# Patient Record
Sex: Female | Born: 1945
Health system: Southern US, Community
[De-identification: ages and names within clinical notes are randomized; demographics above are authoritative.]

## PROBLEM LIST (undated history)

## (undated) DIAGNOSIS — Z9889 Other specified postprocedural states: Secondary | ICD-10-CM

## (undated) DIAGNOSIS — E079 Disorder of thyroid, unspecified: Secondary | ICD-10-CM

## (undated) DIAGNOSIS — E785 Hyperlipidemia, unspecified: Secondary | ICD-10-CM

## (undated) DIAGNOSIS — R040 Epistaxis: Secondary | ICD-10-CM

## (undated) DIAGNOSIS — I4891 Unspecified atrial fibrillation: Secondary | ICD-10-CM

## (undated) DIAGNOSIS — D649 Anemia, unspecified: Secondary | ICD-10-CM

## (undated) DIAGNOSIS — I10 Essential (primary) hypertension: Secondary | ICD-10-CM

## (undated) DIAGNOSIS — N186 End stage renal disease: Secondary | ICD-10-CM

## (undated) DIAGNOSIS — M1990 Unspecified osteoarthritis, unspecified site: Secondary | ICD-10-CM

## (undated) DIAGNOSIS — Z973 Presence of spectacles and contact lenses: Secondary | ICD-10-CM

## (undated) DIAGNOSIS — Z8709 Personal history of other diseases of the respiratory system: Secondary | ICD-10-CM

## (undated) DIAGNOSIS — M12811 Other specific arthropathies, not elsewhere classified, right shoulder: Secondary | ICD-10-CM

## (undated) DIAGNOSIS — R112 Nausea with vomiting, unspecified: Secondary | ICD-10-CM

## (undated) DIAGNOSIS — M75101 Unspecified rotator cuff tear or rupture of right shoulder, not specified as traumatic: Secondary | ICD-10-CM

## (undated) DIAGNOSIS — E669 Obesity, unspecified: Secondary | ICD-10-CM

## (undated) HISTORY — PX: CHOLECYSTECTOMY: SHX55

## (undated) HISTORY — PX: APPENDECTOMY: SHX54

## (undated) HISTORY — PX: ABDOMINAL HYSTERECTOMY: SHX81

## (undated) HISTORY — PX: TONSILLECTOMY: SUR1361

## (undated) HISTORY — PX: COLONOSCOPY: SHX174

## (undated) HISTORY — PX: KIDNEY TRANSPLANT: SHX239

---

## 2000-03-29 ENCOUNTER — Ambulatory Visit (HOSPITAL_COMMUNITY): Admission: RE | Admit: 2000-03-29 | Discharge: 2000-03-29 | Payer: Self-pay | Admitting: Cardiology

## 2000-03-29 ENCOUNTER — Encounter: Payer: Self-pay | Admitting: Cardiology

## 2000-03-30 ENCOUNTER — Encounter: Payer: Self-pay | Admitting: Cardiology

## 2000-04-01 ENCOUNTER — Ambulatory Visit (HOSPITAL_COMMUNITY): Admission: RE | Admit: 2000-04-01 | Discharge: 2000-04-01 | Payer: Self-pay | Admitting: Cardiology

## 2002-02-09 ENCOUNTER — Other Ambulatory Visit: Admission: RE | Admit: 2002-02-09 | Discharge: 2002-02-09 | Payer: Self-pay | Admitting: Internal Medicine

## 2002-03-01 ENCOUNTER — Ambulatory Visit (HOSPITAL_COMMUNITY): Admission: RE | Admit: 2002-03-01 | Discharge: 2002-03-01 | Payer: Self-pay | Admitting: Internal Medicine

## 2002-03-01 ENCOUNTER — Encounter: Payer: Self-pay | Admitting: Internal Medicine

## 2002-04-12 ENCOUNTER — Encounter: Admission: RE | Admit: 2002-04-12 | Discharge: 2002-07-11 | Payer: Self-pay | Admitting: Internal Medicine

## 2002-07-20 ENCOUNTER — Encounter: Admission: RE | Admit: 2002-07-20 | Discharge: 2002-10-18 | Payer: Self-pay | Admitting: Internal Medicine

## 2002-11-08 ENCOUNTER — Encounter: Admission: RE | Admit: 2002-11-08 | Discharge: 2003-02-06 | Payer: Self-pay | Admitting: Internal Medicine

## 2003-02-27 ENCOUNTER — Encounter: Admission: RE | Admit: 2003-02-27 | Discharge: 2003-05-28 | Payer: Self-pay | Admitting: Internal Medicine

## 2003-04-30 ENCOUNTER — Encounter: Payer: Self-pay | Admitting: Internal Medicine

## 2003-04-30 ENCOUNTER — Ambulatory Visit (HOSPITAL_COMMUNITY): Admission: RE | Admit: 2003-04-30 | Discharge: 2003-04-30 | Payer: Self-pay | Admitting: Internal Medicine

## 2005-01-21 ENCOUNTER — Encounter: Admission: RE | Admit: 2005-01-21 | Discharge: 2005-01-21 | Payer: Self-pay | Admitting: Cardiology

## 2005-10-03 ENCOUNTER — Emergency Department (HOSPITAL_COMMUNITY): Admission: EM | Admit: 2005-10-03 | Discharge: 2005-10-03 | Payer: Self-pay | Admitting: Family Medicine

## 2006-04-23 ENCOUNTER — Ambulatory Visit (HOSPITAL_COMMUNITY): Admission: RE | Admit: 2006-04-23 | Discharge: 2006-04-23 | Payer: Self-pay | Admitting: Internal Medicine

## 2006-05-06 ENCOUNTER — Encounter: Admission: RE | Admit: 2006-05-06 | Discharge: 2006-05-06 | Payer: Self-pay | Admitting: Internal Medicine

## 2006-08-12 ENCOUNTER — Encounter: Admission: RE | Admit: 2006-08-12 | Discharge: 2006-08-12 | Payer: Self-pay | Admitting: Internal Medicine

## 2006-09-02 ENCOUNTER — Encounter: Admission: RE | Admit: 2006-09-02 | Discharge: 2006-09-02 | Payer: Self-pay | Admitting: Nephrology

## 2006-09-10 ENCOUNTER — Ambulatory Visit (HOSPITAL_COMMUNITY): Admission: RE | Admit: 2006-09-10 | Discharge: 2006-09-10 | Payer: Self-pay | Admitting: Nephrology

## 2006-09-13 ENCOUNTER — Ambulatory Visit (HOSPITAL_COMMUNITY): Admission: RE | Admit: 2006-09-13 | Discharge: 2006-09-13 | Payer: Self-pay | Admitting: Vascular Surgery

## 2006-10-20 ENCOUNTER — Ambulatory Visit (HOSPITAL_COMMUNITY): Admission: RE | Admit: 2006-10-20 | Discharge: 2006-10-20 | Payer: Self-pay | Admitting: Vascular Surgery

## 2006-12-08 ENCOUNTER — Ambulatory Visit: Payer: Self-pay | Admitting: Vascular Surgery

## 2006-12-08 ENCOUNTER — Ambulatory Visit (HOSPITAL_COMMUNITY): Admission: RE | Admit: 2006-12-08 | Discharge: 2006-12-08 | Payer: Self-pay | Admitting: Vascular Surgery

## 2006-12-17 ENCOUNTER — Ambulatory Visit (HOSPITAL_COMMUNITY): Admission: RE | Admit: 2006-12-17 | Discharge: 2006-12-17 | Payer: Self-pay | Admitting: Vascular Surgery

## 2007-01-19 ENCOUNTER — Ambulatory Visit (HOSPITAL_COMMUNITY): Admission: RE | Admit: 2007-01-19 | Discharge: 2007-01-19 | Payer: Self-pay | Admitting: Vascular Surgery

## 2007-01-26 ENCOUNTER — Ambulatory Visit: Payer: Self-pay | Admitting: Vascular Surgery

## 2007-01-26 ENCOUNTER — Ambulatory Visit (HOSPITAL_COMMUNITY): Admission: RE | Admit: 2007-01-26 | Discharge: 2007-01-26 | Payer: Self-pay | Admitting: Vascular Surgery

## 2007-01-29 ENCOUNTER — Ambulatory Visit (HOSPITAL_COMMUNITY): Admission: RE | Admit: 2007-01-29 | Discharge: 2007-01-29 | Payer: Self-pay | Admitting: Vascular Surgery

## 2007-02-06 ENCOUNTER — Emergency Department (HOSPITAL_COMMUNITY): Admission: EM | Admit: 2007-02-06 | Discharge: 2007-02-06 | Payer: Self-pay | Admitting: Emergency Medicine

## 2007-02-15 ENCOUNTER — Ambulatory Visit: Payer: Self-pay | Admitting: Vascular Surgery

## 2007-02-23 ENCOUNTER — Ambulatory Visit (HOSPITAL_COMMUNITY): Admission: RE | Admit: 2007-02-23 | Discharge: 2007-02-23 | Payer: Self-pay | Admitting: Vascular Surgery

## 2007-03-02 ENCOUNTER — Ambulatory Visit (HOSPITAL_COMMUNITY): Admission: RE | Admit: 2007-03-02 | Discharge: 2007-03-02 | Payer: Self-pay | Admitting: Vascular Surgery

## 2007-03-02 ENCOUNTER — Ambulatory Visit: Payer: Self-pay | Admitting: Vascular Surgery

## 2007-03-09 ENCOUNTER — Ambulatory Visit: Payer: Self-pay | Admitting: Vascular Surgery

## 2007-06-01 ENCOUNTER — Ambulatory Visit: Payer: Self-pay | Admitting: Vascular Surgery

## 2007-06-01 ENCOUNTER — Ambulatory Visit (HOSPITAL_COMMUNITY): Admission: RE | Admit: 2007-06-01 | Discharge: 2007-06-01 | Payer: Self-pay | Admitting: Vascular Surgery

## 2007-06-18 ENCOUNTER — Ambulatory Visit (HOSPITAL_COMMUNITY): Admission: RE | Admit: 2007-06-18 | Discharge: 2007-06-18 | Payer: Self-pay | Admitting: Nephrology

## 2007-06-20 ENCOUNTER — Ambulatory Visit (HOSPITAL_COMMUNITY): Admission: RE | Admit: 2007-06-20 | Discharge: 2007-06-20 | Payer: Self-pay | Admitting: Vascular Surgery

## 2007-07-09 ENCOUNTER — Ambulatory Visit (HOSPITAL_COMMUNITY): Admission: RE | Admit: 2007-07-09 | Discharge: 2007-07-09 | Payer: Self-pay | Admitting: Vascular Surgery

## 2007-07-09 ENCOUNTER — Ambulatory Visit: Payer: Self-pay | Admitting: Vascular Surgery

## 2007-07-26 ENCOUNTER — Ambulatory Visit (HOSPITAL_COMMUNITY): Admission: RE | Admit: 2007-07-26 | Discharge: 2007-07-26 | Payer: Self-pay | Admitting: Surgery

## 2007-08-01 ENCOUNTER — Ambulatory Visit: Payer: Self-pay | Admitting: Surgery

## 2007-08-19 ENCOUNTER — Ambulatory Visit: Payer: Self-pay | Admitting: Surgery

## 2007-08-19 ENCOUNTER — Ambulatory Visit (HOSPITAL_COMMUNITY): Admission: RE | Admit: 2007-08-19 | Discharge: 2007-08-19 | Payer: Self-pay | Admitting: Surgery

## 2007-09-06 ENCOUNTER — Ambulatory Visit: Payer: Self-pay | Admitting: Vascular Surgery

## 2007-10-07 ENCOUNTER — Ambulatory Visit (HOSPITAL_COMMUNITY): Admission: RE | Admit: 2007-10-07 | Discharge: 2007-10-07 | Payer: Self-pay | Admitting: Vascular Surgery

## 2007-10-12 ENCOUNTER — Ambulatory Visit: Payer: Self-pay | Admitting: Vascular Surgery

## 2007-10-12 ENCOUNTER — Ambulatory Visit (HOSPITAL_COMMUNITY): Admission: RE | Admit: 2007-10-12 | Discharge: 2007-10-12 | Payer: Self-pay | Admitting: Vascular Surgery

## 2007-10-22 ENCOUNTER — Ambulatory Visit (HOSPITAL_COMMUNITY): Admission: EM | Admit: 2007-10-22 | Discharge: 2007-10-22 | Payer: Self-pay | Admitting: Vascular Surgery

## 2007-10-26 ENCOUNTER — Ambulatory Visit (HOSPITAL_COMMUNITY): Admission: RE | Admit: 2007-10-26 | Discharge: 2007-10-26 | Payer: Self-pay | Admitting: Vascular Surgery

## 2007-11-03 ENCOUNTER — Ambulatory Visit (HOSPITAL_COMMUNITY): Admission: RE | Admit: 2007-11-03 | Discharge: 2007-11-03 | Payer: Self-pay | Admitting: Surgery

## 2007-11-18 ENCOUNTER — Ambulatory Visit (HOSPITAL_COMMUNITY): Admission: RE | Admit: 2007-11-18 | Discharge: 2007-11-18 | Payer: Self-pay | Admitting: Internal Medicine

## 2007-11-28 ENCOUNTER — Ambulatory Visit: Payer: Self-pay | Admitting: Surgery

## 2007-12-09 ENCOUNTER — Ambulatory Visit: Payer: Self-pay | Admitting: Surgery

## 2007-12-09 ENCOUNTER — Ambulatory Visit (HOSPITAL_COMMUNITY): Admission: RE | Admit: 2007-12-09 | Discharge: 2007-12-09 | Payer: Self-pay | Admitting: Surgery

## 2008-01-30 ENCOUNTER — Ambulatory Visit (HOSPITAL_COMMUNITY): Admission: RE | Admit: 2008-01-30 | Discharge: 2008-01-30 | Payer: Self-pay | Admitting: Surgery

## 2008-02-03 ENCOUNTER — Ambulatory Visit: Payer: Self-pay | Admitting: *Deleted

## 2008-02-03 ENCOUNTER — Ambulatory Visit (HOSPITAL_COMMUNITY): Admission: RE | Admit: 2008-02-03 | Discharge: 2008-02-03 | Payer: Self-pay | Admitting: Nephrology

## 2008-02-10 ENCOUNTER — Ambulatory Visit (HOSPITAL_COMMUNITY): Admission: RE | Admit: 2008-02-10 | Discharge: 2008-02-10 | Payer: Self-pay | Admitting: Surgery

## 2009-03-29 ENCOUNTER — Ambulatory Visit: Payer: Self-pay | Admitting: Surgery

## 2009-03-29 ENCOUNTER — Ambulatory Visit (HOSPITAL_COMMUNITY): Admission: RE | Admit: 2009-03-29 | Discharge: 2009-03-29 | Payer: Self-pay | Admitting: Surgery

## 2009-04-30 ENCOUNTER — Encounter: Admission: RE | Admit: 2009-04-30 | Discharge: 2009-04-30 | Payer: Self-pay | Admitting: Internal Medicine

## 2009-09-20 ENCOUNTER — Ambulatory Visit (HOSPITAL_COMMUNITY): Admission: RE | Admit: 2009-09-20 | Discharge: 2009-09-20 | Payer: Self-pay | Admitting: Nephrology

## 2010-05-02 ENCOUNTER — Encounter (INDEPENDENT_AMBULATORY_CARE_PROVIDER_SITE_OTHER): Payer: Self-pay | Admitting: Emergency Medicine

## 2010-05-02 ENCOUNTER — Emergency Department (HOSPITAL_COMMUNITY): Admission: EM | Admit: 2010-05-02 | Discharge: 2010-05-02 | Payer: Self-pay | Admitting: Emergency Medicine

## 2010-05-02 ENCOUNTER — Ambulatory Visit: Payer: Self-pay | Admitting: Vascular Surgery

## 2010-05-16 ENCOUNTER — Inpatient Hospital Stay (HOSPITAL_COMMUNITY): Admission: EM | Admit: 2010-05-16 | Discharge: 2010-05-20 | Payer: Self-pay | Admitting: Emergency Medicine

## 2010-05-16 ENCOUNTER — Encounter (INDEPENDENT_AMBULATORY_CARE_PROVIDER_SITE_OTHER): Payer: Self-pay | Admitting: Emergency Medicine

## 2010-05-17 ENCOUNTER — Encounter (INDEPENDENT_AMBULATORY_CARE_PROVIDER_SITE_OTHER): Payer: Self-pay | Admitting: Internal Medicine

## 2010-05-17 ENCOUNTER — Encounter (INDEPENDENT_AMBULATORY_CARE_PROVIDER_SITE_OTHER): Payer: Self-pay | Admitting: Nephrology

## 2010-06-16 ENCOUNTER — Encounter (HOSPITAL_COMMUNITY)
Admission: RE | Admit: 2010-06-16 | Discharge: 2010-08-16 | Payer: Self-pay | Source: Home / Self Care | Attending: Cardiology | Admitting: Cardiology

## 2010-09-07 ENCOUNTER — Encounter: Payer: Self-pay | Admitting: Nephrology

## 2010-09-08 ENCOUNTER — Encounter: Payer: Self-pay | Admitting: Internal Medicine

## 2010-10-29 LAB — COMPREHENSIVE METABOLIC PANEL
ALT: 25 U/L (ref 0–35)
ALT: 29 U/L (ref 0–35)
ALT: 42 U/L — ABNORMAL HIGH (ref 0–35)
AST: 30 U/L (ref 0–37)
AST: 35 U/L (ref 0–37)
AST: 76 U/L — ABNORMAL HIGH (ref 0–37)
Albumin: 3.7 g/dL (ref 3.5–5.2)
Albumin: 3.9 g/dL (ref 3.5–5.2)
Albumin: 3.9 g/dL (ref 3.5–5.2)
Alkaline Phosphatase: 75 U/L (ref 39–117)
Alkaline Phosphatase: 76 U/L (ref 39–117)
Alkaline Phosphatase: 94 U/L (ref 39–117)
BUN: 8 mg/dL (ref 6–23)
BUN: 8 mg/dL (ref 6–23)
BUN: 8 mg/dL (ref 6–23)
CO2: 27 mEq/L (ref 19–32)
CO2: 28 mEq/L (ref 19–32)
CO2: 28 mEq/L (ref 19–32)
Calcium: 10 mg/dL (ref 8.4–10.5)
Calcium: 9.4 mg/dL (ref 8.4–10.5)
Calcium: 9.8 mg/dL (ref 8.4–10.5)
Chloride: 101 mEq/L (ref 96–112)
Chloride: 102 mEq/L (ref 96–112)
Chloride: 106 mEq/L (ref 96–112)
Creatinine, Ser: 0.85 mg/dL (ref 0.4–1.2)
Creatinine, Ser: 0.86 mg/dL (ref 0.4–1.2)
Creatinine, Ser: 0.89 mg/dL (ref 0.4–1.2)
GFR calc Af Amer: >60 mL/min (ref >60)
GFR calc Af Amer: >60 mL/min (ref >60)
GFR calc Af Amer: >60 mL/min (ref >60)
GFR calc non Af Amer: >60 mL/min (ref >60)
GFR calc non Af Amer: >60 mL/min (ref >60)
GFR calc non Af Amer: >60 mL/min (ref >60)
Glucose, Bld: 83 mg/dL (ref 70–99)
Glucose, Bld: 93 mg/dL (ref 70–99)
Glucose, Bld: 96 mg/dL (ref 70–99)
Potassium: 3.3 mEq/L — ABNORMAL LOW (ref 3.5–5.1)
Potassium: 3.3 mEq/L — ABNORMAL LOW (ref 3.5–5.1)
Potassium: 4 mEq/L (ref 3.5–5.1)
Sodium: 137 mEq/L (ref 135–145)
Sodium: 138 mEq/L (ref 135–145)
Sodium: 143 mEq/L (ref 135–145)
Total Bilirubin: 0.5 mg/dL (ref 0.3–1.2)
Total Bilirubin: 0.5 mg/dL (ref 0.3–1.2)
Total Bilirubin: 0.8 mg/dL (ref 0.3–1.2)
Total Protein: 6 g/dL (ref 6.0–8.3)
Total Protein: 6.1 g/dL (ref 6.0–8.3)
Total Protein: 6.4 g/dL (ref 6.0–8.3)

## 2010-10-29 LAB — HEPATIC FUNCTION PANEL
ALT: 31 U/L (ref 0–35)
AST: 57 U/L — ABNORMAL HIGH (ref 0–37)
Albumin: 4 g/dL (ref 3.5–5.2)
Alkaline Phosphatase: 82 U/L (ref 39–117)
Bilirubin, Direct: 0.2 mg/dL (ref 0.0–0.3)
Indirect Bilirubin: 0.4 mg/dL (ref 0.3–0.9)
Total Bilirubin: 0.6 mg/dL (ref 0.3–1.2)
Total Protein: 6.4 g/dL (ref 6.0–8.3)

## 2010-10-29 LAB — MAGNESIUM
Magnesium: 1.2 mg/dL — ABNORMAL LOW (ref 1.5–2.5)
Magnesium: 1.6 mg/dL (ref 1.5–2.5)

## 2010-10-29 LAB — CBC
HCT: 34.8 % — ABNORMAL LOW (ref 36.0–46.0)
HCT: 35.1 % — ABNORMAL LOW (ref 36.0–46.0)
Hemoglobin: 11.3 g/dL — ABNORMAL LOW (ref 12.0–15.0)
Hemoglobin: 11.4 g/dL — ABNORMAL LOW (ref 12.0–15.0)
MCH: 30.6 pg (ref 26.0–34.0)
MCH: 30.8 pg (ref 26.0–34.0)
MCHC: 32.5 g/dL (ref 30.0–36.0)
MCHC: 32.5 g/dL (ref 30.0–36.0)
MCV: 94.1 fL (ref 78.0–100.0)
MCV: 94.8 fL (ref 78.0–100.0)
Platelets: 162 10*3/uL (ref 150–400)
Platelets: 163 10*3/uL (ref 150–400)
RBC: 3.67 MIL/uL — ABNORMAL LOW (ref 3.87–5.11)
RBC: 3.73 MIL/uL — ABNORMAL LOW (ref 3.87–5.11)
RDW: 13.1 % (ref 11.5–15.5)
RDW: 13.1 % (ref 11.5–15.5)
WBC: 3.1 10*3/uL — ABNORMAL LOW (ref 4.0–10.5)
WBC: 3.3 10*3/uL — ABNORMAL LOW (ref 4.0–10.5)

## 2010-10-29 LAB — CARDIAC PANEL(CRET KIN+CKTOT+MB+TROPI)
CK, MB: 0.8 ng/mL (ref 0.3–4.0)
CK, MB: 0.9 ng/mL (ref 0.3–4.0)
Relative Index: INVALID (ref 0.0–2.5)
Relative Index: INVALID (ref 0.0–2.5)
Total CK: 63 U/L (ref 7–177)
Total CK: 69 U/L (ref 7–177)
Troponin I: 0.02 ng/mL (ref 0.00–0.06)
Troponin I: 0.02 ng/mL (ref 0.00–0.06)

## 2010-10-29 LAB — FERRITIN: Ferritin: 493 ng/mL — ABNORMAL HIGH (ref 10–291)

## 2010-10-29 LAB — BRAIN NATRIURETIC PEPTIDE: Pro B Natriuretic peptide (BNP): 46 pg/mL (ref 0.0–100.0)

## 2010-10-29 LAB — PROTIME-INR
INR: 1.45 (ref 0.00–1.49)
Prothrombin Time: 17.8 seconds — ABNORMAL HIGH (ref 11.6–15.2)

## 2010-10-29 LAB — ANA: Anti Nuclear Antibody(ANA): POSITIVE — AB

## 2010-10-29 LAB — LIPID PANEL
Cholesterol: 145 mg/dL (ref 0–200)
HDL: 61 mg/dL (ref >39)
LDL Cholesterol: 73 mg/dL (ref 0–99)
Total CHOL/HDL Ratio: 2.4 RATIO
Triglycerides: 56 mg/dL (ref <150)
VLDL: 11 mg/dL (ref 0–40)

## 2010-10-29 LAB — PROTEIN / CREATININE RATIO, URINE
Creatinine, Urine: 46.4 mg/dL
Protein Creatinine Ratio: 0.04 (ref <0.15)
Total Protein, Urine: 2 mg/dL

## 2010-10-29 LAB — SODIUM, URINE, RANDOM: Sodium, Ur: 129 mEq/L

## 2010-10-29 LAB — ANTI-NUCLEAR AB-TITER (ANA TITER): ANA Titer 1: NEGATIVE

## 2010-10-29 LAB — IRON AND TIBC
Iron: 32 ug/dL — ABNORMAL LOW (ref 42–135)
Saturation Ratios: 15 % — ABNORMAL LOW (ref 20–55)
TIBC: 214 ug/dL — ABNORMAL LOW (ref 250–470)
UIBC: 182 ug/dL

## 2010-10-29 LAB — HEPATITIS PANEL, ACUTE
HCV Ab: NEGATIVE
Hep A IgM: NEGATIVE
Hep B C IgM: NEGATIVE
Hepatitis B Surface Ag: NEGATIVE

## 2010-10-29 LAB — RETICULOCYTES
RBC.: 3.63 MIL/uL — ABNORMAL LOW (ref 3.87–5.11)
Retic Count, Absolute: 43.6 10*3/uL (ref 19.0–186.0)
Retic Ct Pct: 1.2 % (ref 0.4–3.1)

## 2010-10-29 LAB — FOLATE: Folate: >20 ng/mL

## 2010-10-29 LAB — VITAMIN B12: Vitamin B-12: 903 pg/mL (ref 211–911)

## 2010-10-29 LAB — PHOSPHORUS: Phosphorus: 3.3 mg/dL (ref 2.3–4.6)

## 2010-10-29 LAB — CREATININE, URINE, RANDOM
Creatinine, Urine: 18.5 mg/dL
Creatinine, Urine: 43 mg/dL

## 2010-10-29 LAB — D-DIMER, QUANTITATIVE: D-Dimer, Quant: 1.05 ug/mL-FEU — ABNORMAL HIGH (ref 0.00–0.48)

## 2010-10-30 LAB — DIFFERENTIAL
Basophils Absolute: 0 10*3/uL (ref 0.0–0.1)
Basophils Relative: 0 % (ref 0–1)
Eosinophils Absolute: 0.1 10*3/uL (ref 0.0–0.7)
Eosinophils Relative: 3 % (ref 0–5)
Lymphocytes Relative: 14 % (ref 12–46)
Lymphs Abs: 0.5 10*3/uL — ABNORMAL LOW (ref 0.7–4.0)
Monocytes Absolute: 0.5 10*3/uL (ref 0.1–1.0)
Monocytes Relative: 13 % — ABNORMAL HIGH (ref 3–12)
Neutro Abs: 2.5 10*3/uL (ref 1.7–7.7)
Neutrophils Relative %: 69 % (ref 43–77)

## 2010-10-30 LAB — CBC
HCT: 34.8 % — ABNORMAL LOW (ref 36.0–46.0)
Hemoglobin: 11.3 g/dL — ABNORMAL LOW (ref 12.0–15.0)
MCH: 31 pg (ref 26.0–34.0)
MCHC: 32.5 g/dL (ref 30.0–36.0)
MCV: 95.3 fL (ref 78.0–100.0)
Platelets: 164 10*3/uL (ref 150–400)
RBC: 3.65 MIL/uL — ABNORMAL LOW (ref 3.87–5.11)
RDW: 13.1 % (ref 11.5–15.5)
WBC: 3.7 10*3/uL — ABNORMAL LOW (ref 4.0–10.5)

## 2010-10-30 LAB — BASIC METABOLIC PANEL
BUN: 10 mg/dL (ref 6–23)
CO2: 26 mEq/L (ref 19–32)
Calcium: 9.9 mg/dL (ref 8.4–10.5)
Chloride: 111 mEq/L (ref 96–112)
Creatinine, Ser: 0.77 mg/dL (ref 0.4–1.2)
GFR calc Af Amer: >60 mL/min (ref >60)
GFR calc non Af Amer: >60 mL/min (ref >60)
Glucose, Bld: 87 mg/dL (ref 70–99)
Potassium: 3.5 mEq/L (ref 3.5–5.1)
Sodium: 143 mEq/L (ref 135–145)

## 2010-10-30 LAB — URINALYSIS, ROUTINE W REFLEX MICROSCOPIC
Bilirubin Urine: NEGATIVE
Glucose, UA: NEGATIVE mg/dL
Hgb urine dipstick: NEGATIVE
Ketones, ur: 15 mg/dL — AB
Nitrite: NEGATIVE
Protein, ur: 30 mg/dL — AB
Specific Gravity, Urine: 1.019 (ref 1.005–1.030)
Urobilinogen, UA: 0.2 mg/dL (ref 0.0–1.0)
pH: 7 (ref 5.0–8.0)

## 2010-10-30 LAB — BRAIN NATRIURETIC PEPTIDE: Pro B Natriuretic peptide (BNP): 596 pg/mL — ABNORMAL HIGH (ref 0.0–100.0)

## 2010-10-30 LAB — URINE MICROSCOPIC-ADD ON

## 2010-10-30 LAB — TROPONIN I: Troponin I: 0.01 ng/mL (ref 0.00–0.06)

## 2010-10-30 LAB — URINE CULTURE
Colony Count: NO GROWTH
Culture  Setup Time: 201109302016
Culture: NO GROWTH

## 2010-10-30 LAB — TSH: TSH: 0.825 u[IU]/mL (ref 0.350–4.500)

## 2010-10-30 LAB — CK TOTAL AND CKMB (NOT AT ARMC)
CK, MB: 0.9 ng/mL (ref 0.3–4.0)
Relative Index: INVALID (ref 0.0–2.5)
Total CK: 71 U/L (ref 7–177)

## 2010-11-05 LAB — POTASSIUM: Potassium: 4.2 mEq/L (ref 3.5–5.1)

## 2010-11-22 LAB — GLUCOSE, CAPILLARY
Glucose-Capillary: 72 mg/dL (ref 70–99)
Glucose-Capillary: 75 mg/dL (ref 70–99)

## 2010-11-22 LAB — POCT I-STAT 4, (NA,K, GLUC, HGB,HCT)
Glucose, Bld: 69 mg/dL — ABNORMAL LOW (ref 70–99)
HCT: 40 % (ref 36.0–46.0)
Hemoglobin: 13.6 g/dL (ref 12.0–15.0)
Potassium: 4.3 mEq/L (ref 3.5–5.1)
Sodium: 140 mEq/L (ref 135–145)

## 2010-12-30 NOTE — Op Note (Signed)
Sarah Gilbert, Sarah Gilbert              ACCOUNT NO.:  [id number]   MEDICAL RECORD NO.:  [id number]          PATIENT TYPE:  AMB   LOCATION:  SDS                          FACILITY:  [facility]   PHYSICIAN:  Nelda Severe. Kellie Simmering, M.D.  DATE OF BIRTH:  [birth year]   DATE OF PROCEDURE:  01/19/2007  DATE OF DISCHARGE:                               OPERATIVE REPORT   PREOPERATIVE DIAGNOSIS:  End stage renal disease, thrombosed  arteriovenous Gore-Tex graft, left forearm.   POSTOPERATIVE DIAGNOSIS:  End stage renal disease, thrombosed  arteriovenous Gore-Tex graft, left forearm.   OPERATION:  Thrombectomy, arteriovenous Gore-Tex graft, left forearm,  with insertion of a new segment of graft from existing graft to basilic  vein with 6 mm Gore-Tex.   SURGEON:  Nelda Severe. Kellie Simmering, M.D.   FIRST ASSISTANT:  Nurse   ANESTHESIA:  Local.   PROCEDURE:  The patient was taken to the operating room and placed in a  supine position at which time the left upper extremity was prepped with  Betadine scrub solution and draped in a routine sterile manner.  After  infiltration of 1% Xylocaine with epinephrine, a longitudinal incision  was made through the previous scar.  The Gore-Tex graft had been revised  into the basilic vein about four weeks earlier.  The graft was encircled  with a vessel loop and the vein dissected free proximally and it was a 5  mm vein.  A transverse opening was made in the graft which was filled  with fresh thrombus.  The graft was easily thrombectomized with  excellent inflow being re-established.  The venous anastomosis was an  end-to-side and, although not stenotic, it was decided to convert this  to an end-to-end to a more proximal site on the vein after easily  thrombectomizing the vein.  A new piece of 6 mm graft was anastomosed  end-to-end to the old graft and end-to-end to the proximal vein after  spatulating both ends with 6-0 Prolene.  The clamp was then released,  there was an  excellent pulse and thrill in the graft.  No protamine was  given.  The wound was irrigated with saline and closed in layers with  Vicryl in a subcuticular fashion.  A  sterile dressing was applied.  The  patient was taken to the recovery room in satisfactory condition.      Nelda Severe Kellie Simmering, M.D.  Electronically Signed     JDL/MEDQ  D:  01/19/2007  T:  01/19/2007  Job:  [id number]

## 2010-12-30 NOTE — Op Note (Signed)
Sarah Gilbert, Sarah Gilbert              ACCOUNT NO.:  [id number]   MEDICAL RECORD NO.:  [id number]          PATIENT TYPE:  AMB   LOCATION:  SDS                          FACILITY:  [facility]   PHYSICIAN:  Rosetta Posner, M.D.    DATE OF BIRTH:  15-May-1946   DATE OF PROCEDURE:  10/22/2007  DATE OF DISCHARGE:                               OPERATIVE REPORT   PREOPERATIVE DIAGNOSIS:  End-stage renal disease, occluded right arm AV  Gore-Tex graft.   POSTOPERATIVE DIAGNOSIS:  End-stage renal disease, occluded right arm AV  Gore-Tex graft.   PROCEDURE:  1. Thrombectomy.  2. Revision of venous anastomosis of right arm AV Gore-Tex graft.   SURGEON:  Rosetta Posner, M.D.   ASSISTANT:  Nurse.   ANESTHESIA:  Monitored Anesthesia Care.   COMPLICATIONS:  None.   DISPOSITION:  Recovery Room, stable.   PROCEDURE:  After intubation, the patient was taken to the operating  room and placed in the supine position.  The area of the right arm was  prepped and draped in the usual sterile fashion.  An incision was made  over the above elbow extension of the forearm loop graft.  This was end-  to-side to the basilic vein. The graft was opened transversely near the  basilic venous anastomosis.  A 5 dilator passed with slight resistance  through the venous anastomosis and the vein above this appeared to be of  good caliber.  The vein was flushed with heparinized saline.  Next, the  graft itself was thrombectomized, arterialized plug was removed and  excellent inflow was encountered.  This was flushed with heparinized  saline and reoccluded.  An incision was made to convert this from an end-  to-side to an end-to-end anastomosis of the vein.  The vein was wedged  further proximally and was occluded.  The vein was ligated distally and  divided and the vein was spatulated.  A new interposition graft of 7 mm  Gore-Tex was brought into the field and was spatulated and sewn end-to-  end to the vein with a running  6-0 Prolene suture.  The clamps were  removed and this was flushed with heparinized saline and reoccluded.  Next, the interposition graft was cut to the appropriate length and sewn  end-to-end to the old graft with running 6-0 Prolene suture.  The clamps  were  removed and excellent thrill was noted.  The wound was irrigated with  saline.  Hemostasis was achieved with electrocautery.  The wounds were  closed with 3-0 Vicryl in the subcutaneous and subcuticular tissues.  Steri-Strips were applied.      Rosetta Posner, M.D.  Electronically Signed     TFE/MEDQ  D:  10/22/2007  T:  10/22/2007  Job:  [id number]

## 2010-12-30 NOTE — Op Note (Signed)
Sarah Gilbert, Sarah Gilbert              ACCOUNT NO.:  [id number]   MEDICAL RECORD NO.:  [id number]          PATIENT TYPE:  AMB   LOCATION:  SDS                          FACILITY:  [facility]   PHYSICIAN:  VAnnamarie Major IV, MDDATE OF BIRTH:  Jul 06, 1946   DATE OF PROCEDURE:  07/26/2007  DATE OF DISCHARGE:  07/26/2007                               OPERATIVE REPORT   PREOPERATIVE DIAGNOSIS:  End-stage renal disease.   POSTOPERATIVE DIAGNOSIS:  End-stage renal disease.   PROCEDURES PERFORMED:  1. Ultrasound-guided access, left internal jugular vein.  2. Left IJ Diatek catheter placement.   SURGEON:  1. Leia Alf, M.D.   ANESTHESIA:  MAC.   BLOOD LOSS:  Minimal.   COMPLICATIONS:  None.   PROCEDURE:  The patient was identified in the holding area and taken to  room #6.  She was placed supine on the table.  She was prepped and  draped in standard sterile fashion.  Time-out was called and  perioperative antibiotics were administered.  The left internal jugular  vein was evaluated with ultrasound and found to be widely patent.  Lidocaine 1% was used for local anesthesia.  Using ultrasound, the left  internal jugular vein was accessed with an 18-gauge needle.  Next, a  0.035 wire was advanced under fluoroscopic visualization into the  inferior vena cava.  Next, a series of dilators were used to dilate the  subcutaneous tract.  The peel-away sheath was then placed.  Next, a 32-  cm Diatek catheter was then placed at the peel-away sheath, which was  then removed.  The catheter was positioned with the tip at the  cavoatrial junction and then the skin exit site was measured for the  catheter.  The skin exit site and the subcutaneous tract was then  anesthetized with 1% lidocaine.  A nick in the skin was made with a 11-  blade.  Subcutaneous tunnel was then tunneled between the 2 incisions  and then dilators used to dilate the tract.  The catheter was then  brought through the tract and  the cuff was situated at the skin exit  site.  Fluoroscopy was used to confirm the tip at the cavoatrial  junction.  There were no kinks within the catheter.  Catheter was then  sutured into position with 3-0 nylon.  Incision in the neck was closed  with 4-0 Vicryl.  Both ports were flushed and aspirated without  difficulty.  Catheter was then filled with appropriate volumes of  heparin and the patient was taken to recovery room in stable condition  after sterile dressings were applied.          ______________________________  V. Leia Alf, MD  Electronically Signed    VWB/MEDQ  D:  07/26/2007  T:  07/27/2007  Job:  [id number]

## 2010-12-30 NOTE — Op Note (Signed)
[name], FUENTE              ACCOUNT NO.:  [id number]   MEDICAL RECORD NO.:  [id number]          PATIENT TYPE:  AMB   LOCATION:  SDS                          FACILITY:  [facility]   PHYSICIAN:  VAnnamarie Major IV, MDDATE OF BIRTH:  1946-03-04   DATE OF PROCEDURE:  DATE OF DISCHARGE:                               OPERATIVE REPORT   PREOPERATIVE DIAGNOSIS:  Occluded right upper arm atrioventricular Gore-  Tex graft.   POSTOPERATIVE DIAGNOSIS:  Occluded right upper arm atrioventricular Gore-  Tex graft.   PROCEDURE PERFORMED:  Thrombectomy and revision of right upper arm AV  Gore-Tex graft.   SURGEON:  1. Leia Alf, MD   ASSISTANT:  Chad Cordial, PA   ANESTHESIA:  MAC.   BLOOD LOSS:  50 mL.   FINDINGS:  End-to-side converted to end-to-end anastomosis with 6 mm  interposition graft.   PROCEDURE:  The patient was identified in the holding and taken to room  H.  He was placed supine on the table.  MAC anesthesia was administered.  The patient was prepped and draped in standard sterile fashion.  Time-  out was called.  Antibiotics were given.  Lidocaine 1% was used for  local anesthesia.  The patient's upper arm incision was opened with #10  blade.  Cautery was used to divide the subcutaneous tissue.  The graft  and vein anastomosis were identified.  The graft was circumferentially  mobilized.  I exposed the axillary vein proximal to the anastomosis.  It  appeared to be without disease.  At this point, the patient was given  3000 units of heparin.  I elected to transect the graft at the level of  the anastomosis.  The anastomosis was then oversewn with a 5-0 Prolene.  There was fresh thrombus present which was evacuated.  Next, I elected  to ligate the vein just proximal to the anastomosis.  I performed  thrombectomy using a #4 Fogarty which was passed centrally without  resistance.  No clot was evacuated.  The vein at this level was without  significant disease.   It was occluded with a vascular clamp.  Next, #4  Fogarty catheter was used to perform thrombectomy of the graft.  The  arterial plug was removed.  There was excellent flow through the graft.  The graft was then flushed with heparinized saline and reoccluded.  A 6-  mm Gore-Tex interposition graft was brought onto the field, and end-to-  end anastomosis was performed between the 2 grafts using a 6-0 Prolene.  Once this was completed, the graft was cut to the appropriate length.  It was beveled and the vein was spatulated.  An end-to-end anastomosis  was then performed using a running 6-0 Prolene.  Prior to completion, I  passed a Fogarty catheter back down through the crossed atrial  anastomosis.  I did not evacuate any clot.  There was excellent inflow.  The anastomosis was then completed.  Handheld Doppler was used to  evaluate signal in the axillary vein which had a good venous signal.  There was also good signals in the radial and  ulnar arteries.  Next, the  wound was copiously irrigated.  Hemostasis was achieved.  I did place a  wrist/Vitasure within the wound.  The tissue was closed in 2 layers of 3-  0 Vicryl.  Dermabond was placed on the skin.  The patient tolerated the  procedure well and was taken to recovery room in stable condition.      [name], MD  Electronically Signed     V. Leia Alf, MD  Electronically Signed    VWB/MEDQ  D:  03/29/2009  T:  03/30/2009  Job:  [id number]

## 2010-12-30 NOTE — Op Note (Signed)
NAMETZIPORA, PENTICO              ACCOUNT NO.:  [id number]   MEDICAL RECORD NO.:  [id number]          PATIENT TYPE:  AMB   LOCATION:  SDS                          FACILITY:  [facility]   PHYSICIAN:  Judeth Cornfield. Scot Dock, M.D.DATE OF BIRTH:  May 24, 1946   DATE OF PROCEDURE:  DATE OF DISCHARGE:                               OPERATIVE REPORT   PREOPERATIVE DIAGNOSIS:  Chronic renal failure.   POSTOPERATIVE DIAGNOSIS:  Chronic renal failure.   PROCEDURE:  Insertion of new right forearm loop AV graft.   SURGEON:  Scot Dock.   ASSISTANT:  Myra Gianotti, PA.   ANESTHESIA:  Local with sedation.   TECHNIQUE:  The patient was taken to the operating room and sedated by  anesthesia.  The right upper extremity was prepped and draped in usual  sterile fashion.  After the skin was infiltrated with 1% lidocaine, a  transverse incision was made just below the antecubital level and here  the basilic vein and brachial artery were dissected free.  Of note,  there was a plexus of veins on top of the artery.  These were mobilized  laterally.  The artery was somewhat small, but I was just above the  bifurcation of brachial artery into the radial and ulnar branch.  The  basilic vein was ligated distally and easily took a 5-mm dilator.  It  was irrigated with heparinized saline.  A 4- to 7-mm graft was then  tunneled in a loop fashion in the forearm with the arterial aspect of  the graft along the ulnar aspect of the forearm.  The patient was then  heparinized.  The tunneling was done with one distal counter incision.  Next, the brachial artery was clamped proximally and distally and a  longitudinal arteriotomy was made.  A segment of the 4-mm end of the  graft was excised.  The graft spatulated and sewn end-to-side to the  artery using continuous 6-0 Prolene suture.  The graft was imported to  the appropriate length for anastomosis to the basilic vein.  The vein  was ligated distally and spatulated  proximally.  The graft was cut to  the appropriate length, spatulated, sewn end-to-end to the vein using  continuous 6-0 Prolene suture.  At the completion, there was an  excellent thrill in the fistula with a good radial and ulnar signal with  the Doppler.  Hemostasis was obtained in the wounds.  The wounds were  closed with a deep layer of 3-0 Vicryl.  The skin closed with 4-0  Vicryl.  Sterile dressing was applied.  The patient tolerated the  procedure well was transferred to the recovery room in satisfactory  condition.  All needle and sponge counts were correct.      Judeth Cornfield. Scot Dock, M.D.  Electronically Signed     CSD/MEDQ  D:  08/19/2007  T:  08/19/2007  Job:  [id number]

## 2010-12-30 NOTE — Assessment & Plan Note (Signed)
OFFICE VISIT   Sarah Gilbert, Sarah Gilbert  DOB:  [birth year]                                       02/15/2007  [id number]   This is an office note.  The patient has had multiple failed access  grafts in the left forearm over the last several months since it was  inserted in January of 2008.  She is being evaluated for new vascular  access.  The best plan would be to insert a left upper arm graft,  although she has had multiple incisions over a short period of time, it  would be nice for this to heal for a few more weeks.  There is no  evidence of any infection.  A short piece of the graft was sent for gram  staining culture during the last procedure and had a few gram positive  cocci noted, but no organisms grew in the culture.  There has never been  any clinical evidence of infection.  We will plan a central venogram at  [facility] over the next few weeks and plan to insert a left upper  arm graft on Wednesday, July 16 at [facility] as an outpatient.   Nelda Severe Kellie Simmering, M.D.  Electronically Signed   JDL/MEDQ  D:  02/15/2007  T:  02/16/2007  Job:  [id number]   cc:   Sherril Croon, M.D.

## 2010-12-30 NOTE — Op Note (Signed)
Sarah Gilbert, Sarah Gilbert              ACCOUNT NO.:  [id number]   MEDICAL RECORD NO.:  [id number]          PATIENT TYPE:  AMB   LOCATION:  SDS                          FACILITY:  [facility]   PHYSICIAN:  VAnnamarie Major IV, MDDATE OF BIRTH:  [birth year]   DATE OF PROCEDURE:  11/03/2007  DATE OF DISCHARGE:                               OPERATIVE REPORT   PROCEDURES PERFORMED:  1. Right arm venogram.  2. Central venogram.   PROCEDURE:  Peripheral IV was placed in the holding area.  Contrast  injections were performed.   FINDINGS:  Right arm:  The cephalic vein is visualized and found to be  diseased at the antecubital crease and then measured approximately 2 mm  throughout the arm.  Brachial veins were widely patent.   Central venogram:  There is no evidence of central stenosis; however,  the luminal flow channel narrows at the level of the dialysis catheter.  Likely synechiae present.  However, no collaterals are visualized to  suggest this is a hemodynamically significant lesion.   IMPRESSION:  1. Luminal narrowing at the level of the right-sided dialysis      catheter.  2. Patent brachial veins with diseased cephalic vein.           ______________________________  V. Leia Alf, MD  Electronically Signed     VWB/MEDQ  D:  11/03/2007  T:  11/03/2007  Job:  [id number]

## 2010-12-30 NOTE — Op Note (Signed)
Sarah Gilbert, Sarah Gilbert              ACCOUNT NO.:  [id number]   MEDICAL RECORD NO.:  [id number]          PATIENT TYPE:  AMB   LOCATION:  SDS                          FACILITY:  [facility]   PHYSICIAN:  VAnnamarie Major IV, MDDATE OF BIRTH:  December 07, 1945   DATE OF PROCEDURE:  12/09/2007  DATE OF DISCHARGE:  12/09/2007                               OPERATIVE REPORT   PREOPERATIVE DIAGNOSIS:  End-stage renal disease.   POSTOPERATIVE DIAGNOSIS:  End-stage renal disease.   PROCEDURE PERFORMED:  Right upper arm cortex graft.   ANESTHESIA:  MAC.   SURGEON:  1. Annamarie Major IV, MD   ESTIMATED BLOOD LOSS:  Minimal.   FINDINGS:  1. 4 x 7 graft placed.  2. Small brachial artery.  3. End-to-side venous anastomosis.   PROCEDURE:  The patient was identified in the holding area and taken to  the room 3.  She was placed in supine on the table.  MAC anesthesia was  administered.  The right arm was prepped and draped in a standard  sterile fashion, time-out taken and antibiotics given.  A transverse  incision was made above the antecubital crease.  Bovie cautery was used  to dissect the subcutaneous tissue.  Crural fascia was identified and  divided with sharply.  The artery was very deep within the patient's  arm.  It was small measuring approximately 3-4 mm.  It was inserted with  a [place] and mobilized proximally and distally.   The upper arm incision was made in the bicipital groove.  Bovie cautery  was used to dissect the subcutaneous tissue.  The brachial vein was  identified and felt to be adequate measuring approximately 6 mm.  It was  mobilized proximally and distally.   Subcutaneous tunnel was creating using the horseshoe tunnel.  Once this  was completed, the patient was given systemic heparinization.  A 4 x 7  Gore-Tex graft was brought through the tunnel and then removed.   The brachial artery was occluded proximally and distally with serrefine  clamps.  It was opened with a #11  blade and extended with Potts  scissors.  A 4-mm end of the graft was then spatulated to fit size of  the arteriotomy.  Using 6-0 Prolene, running anastomoses was completed  in an end to side fashion.  Prior to completion, the artery was flushed  in antegrade and retrograde fashion.  Anastomosis was then secured.  There was postop flow from the graft.  Graft was instilled with  heparinized saline and reoccluded.  Attention was then turned towards  the venous anastomosis.  The vein was occluded with serrefine clamps.  A  longitudinal venotomy was made with #11 blade and extended with Potts  scissors.  The 7-mm end of the graft was then bevelled to further assess  the venotomy.  An end-to-side anastomosis was created using a running 6-  0 Prolene.  Prior to completion, the vein and the graft were flushed  appropriately.  One repair stitch was required in the total of the  anastomosis.  I evaluated the radial artery after the procedure and had  a good signal.  There is good palpable thrill  within the graft.  At  this point of time, the heparinization was reversed with 50 mg of  protamine.  Hemostasis was achieved.  The deep tissues were  reapproximated with running 3-0 Vicryl and the skin was closed with 4-0  Vicryl.  Dermabond was placed.  The patient tolerated the procedure  well.  There were no complications.           ______________________________  V. Leia Alf, MD  Electronically Signed     VWB/MEDQ  D:  12/09/2007  T:  12/10/2007  Job:  [id number]

## 2010-12-30 NOTE — Op Note (Signed)
NAMEKALINDA, GOLDFIELD              ACCOUNT NO.:  [id number]   MEDICAL RECORD NO.:  [id number]          PATIENT TYPE:  AMB   LOCATION:  SDS                          FACILITY:  [facility]   PHYSICIAN:  Nelda Severe. Kellie Simmering, M.D.  DATE OF BIRTH:  22-Apr-1946   DATE OF PROCEDURE:  10/26/2007  DATE OF DISCHARGE:                               OPERATIVE REPORT   PREOPERATIVE DIAGNOSIS:  End-stage renal disease.   POSTOPERATIVE DIAGNOSIS:  End-stage renal disease.   OPERATION:  1. Bilateral ultrasound localization of internal jugular veins.  2. Insertion of Diatek catheter via right internal jugular vein (24      cm).   SURGEON:  Nelda Severe. Kellie Simmering, M.D.   FIRST ASSISTANT:  Nurse.   ANESTHESIA:  Local.   PROCEDURE:  Patient was taken to the operating room and placed in the  Trendelenburg position.  The upper chest and neck exposed.  Both  internal jugular veins were imaged using B-mode ultrasound.  The right  noted to be significantly larger than the left, but both had good flow  and appeared normal.  After prep and drape in a routine sterile manner,  the right internal jugular vein was entered using a supraclavicular  approach.  The guidewire placed into the right atrium under fluoroscopic  guidance.  After dilating the tract appropriately, a 24 cm Diatek  catheter was passed through a peel-away sheath and positioned in the  right atrium, tunneled peripherally and secured with nylon sutures.  The  wound was closed with Vicryl in a subcuticular fashion.  Sterile  dressing applied.  Patient was taken to the recovery room in a  satisfactory condition.      Nelda Severe Kellie Simmering, M.D.  Electronically Signed     JDL/MEDQ  D:  10/26/2007  T:  10/26/2007  Job:  [id number]

## 2010-12-30 NOTE — Op Note (Signed)
NAMERHODESIA, BRANDLE              ACCOUNT NO.:  [id number]   MEDICAL RECORD NO.:  [id number]          PATIENT TYPE:  AMB   LOCATION:  SDS                          FACILITY:  [facility]   PHYSICIAN:  Charles E. Fields, MD  DATE OF BIRTH:  [birth year]   DATE OF PROCEDURE:  06/20/2007  DATE OF DISCHARGE:  06/20/2007                               OPERATIVE REPORT   PREOPERATIVE DIAGNOSIS:  Thrombosed left upper arm AV graft.   POSTOP DIAGNOSIS:  Thrombosed left upper arm AV graft.   ANESTHESIA:  Local with IV sedation.   PROCEDURE:  Simple thrombectomy in left upper arm AV graft.   ASSISTANT:  Remo Lipps, RNFA   ANESTHESIA:  Local with IV sedation.   FINDINGS:  Thrombosed AV graft no significant venous narrowing.   OPERATIVE DETAILS:  After obtaining informed consent, the patient taken  to the operating room.  The patient was placed in the supine position on  the operating table.  After adequate sedation, the patient's entire left  upper extremity is prepped and draped in the usual sterile fashion.  Local anesthesia is infiltrated at a preexisting longitudinal scar up in  the axilla.  Incision was reopened, and carried down through  subcutaneous tissues, down to the level of graft.  Graft was dissected  free circumferentially.  Dissection was carried up to the level of the  venous anastomosis.  The vein was of good quality.  This was dissected  free circumferentially.  There was a sensory nerve overlying the vein,  and this was gently retracted laterally.  The patient was then given  5000 units of intravenous heparin.  A transverse graftotomy was made  just above the level of the venous anastomosis.  A #4 Fogarty catheter  was used to thrombectomized the venous limb of the graft.  All  thrombotic material was removed, and there was good venous backbleeding.  This was thoroughly flushed with heparinized saline.  The vein was then  clamped with a fine bulldog clamp.  Arterial limb  of the graft was then  thrombectomized with #4 Fogarty catheter.  Multiple passes were made  until all thrombotic material was removed, and arterialized plug was  retrieved.  This was then clamped proximally.  The venous anastomosis  was then probed, and found to easily accept 3, 4, and 5 coronary  dilators.  The graftotomy was then repaired with a running 6-0 Prolene  suture.  Just prior to completion anastomosis, it was fore bled, back  bled, and thoroughly flushed.  Anastomosis was secured, clamps were  released.  There was a palpable thrill in the graft immediately.   Next, hemostasis was obtained.  Subcutaneous tissues reapproximated  using a running 3-0 Vicryl suture.  Skin was closed with a 4-0 Vicryl  subcuticular stitch.  The patient tolerated the procedure well, and  there were no other complications.  Instrument, sponge, and needle  counts were correct at the end of the case. The patient taken to  recovery room in stable condition.  The patient had an audible ulnar and  radial Doppler signal at the end of the  case.      Jessy Oto. Fields, MD  Electronically Signed     CEF/MEDQ  D:  06/20/2007  T:  06/21/2007  Job:  [id number]

## 2010-12-30 NOTE — Assessment & Plan Note (Signed)
OFFICE VISIT   Harron, Draven L  DOB:  [birth year]                                       11/28/2007  [id number]   REASON FOR VISIT:  Evaluate access.   HISTORY:  This is a 65 year old female who has had multiple left-sided  procedures as well as right forearm procedures.  She is currently  dialyzing through a right IJ Diatek.  She on March 19 underwent central  venogram which showed no evidence of central stenosis and comes back in  for further discussions.   PHYSICAL EXAMINATION:  Vital Signs:  Pulse is 90, respirations 18, O2  sats are 99%.  General:  She is well-appearing, in no acute distress.  Cardiovascular:  Regular rate and rhythm.  Pulmonary:  Lungs are clear  bilaterally.  Right Arm:  Shows well-healed incisions from her forearm  graft, no palpable radial pulse.   PLAN:  After reviewing her venogram, I feel our next option is a right  upper arm graft.  I told the patient I would try to avoid going through  her old incisions at her antecubital level, however, this may not be an  option given the location of her brachial artery.  She is scheduled for  a right upper arm graft to be performed on Friday, April 24th.  The  risks and benefits of this were discussed, informed consent was  obtained.  She dialyzes on Tuesday/Thursday/Saturday.   [name], MD  Electronically Signed   VWB/MEDQ  D:  11/28/2007  T:  11/29/2007  Job:  [id number]   cc:   Windy Kalata, M.D.

## 2010-12-30 NOTE — Op Note (Signed)
NAMERYANNAH, BROOMELL              ACCOUNT NO.:  [id number]   MEDICAL RECORD NO.:  [id number]          PATIENT TYPE:  AMB   LOCATION:  SDS                          FACILITY:  [facility]   PHYSICIAN:  Nelda Severe. Kellie Simmering, M.D.  DATE OF BIRTH:  [birth year]   DATE OF PROCEDURE:  01/26/2007  DATE OF DISCHARGE:                               OPERATIVE REPORT   PREOPERATIVE DIAGNOSIS:  Recurrent occlusion of left forearm  arteriovenous Gore-Tex graft.   POSTOPERATIVE DIAGNOSIS:  Recurrent occlusion of left forearm  arteriovenous Gore-Tex graft.   OPERATION:  1. Simple thrombectomy, arteriovenous Gore-Tex graft, left forearm,      with exploration of arterial end.  2. Intraoperative shuntogram.   SURGEON:  Nelda Severe. Kellie Simmering, M.D.   FIRST ASSISTANT:  Darlin Coco, PA.   ANESTHESIA:  Local.   PROCEDURE:  The patient was taken to the operating room and placed in  the supine position, at which time the left upper extremity was prepped  with Betadine scrub and solution and draped in a routine sterile manner.  After infiltration with 1% Xylocaine, a short longitudinal incision was  made near the arterial anastomosis from the Gore-Tex to artery in the  antecubital area.  Gore-Tex was dissected free up to the artery, 3000  units of heparin given intravenously.  A transverse opening was made in  the graft, which had been revised on the venous end only 5 days earlier.  The arterial end was easily thrombectomized and this anastomosis would  accept a 4.5 mm dilator and was explored with a right angle clamp with  no adherent thrombus noted to be left, with good inflow.  The graft  itself was easily thrombectomized on multiple occasions with a #5  Fogarty and heparin-saline could be flushed under low resistance.  The  catheter would easily traverse the entire venous system on the left arm.  Following this the graft was reclosed with continuous 6-0 Prolene  sutures, clamp released, and there was a  palpable pulse and thrill in  the graft and good flow in the upper arm.  Intraoperative shuntogram was  performed, which revealed the graft to be widely patent with no defects  and a widely patent venous anastomosis.  The vein did become larger up  in the axillary area, but this would have required another 10 cm  segment, but the vein at the venous anastomosis was 5 mm in size.  The  wound was closed in layers with Vicryl in a subcuticular fashion, a  sterile dressing applied.  The patient taken to the recovery room in  satisfactory condition.      Nelda Severe Kellie Simmering, M.D.  Electronically Signed     JDL/MEDQ  D:  01/26/2007  T:  01/26/2007  Job:  [id number]

## 2010-12-30 NOTE — Op Note (Signed)
NAMENICY, SOLO NO.:  [id number]   MEDICAL RECORD NO.:  [id number]          PATIENT TYPE:  EMS   LOCATION:  MAJO                         FACILITY:  [facility]   PHYSICIAN:  Nelda Severe. Kellie Simmering, M.D.  DATE OF BIRTH:  Nov 15, 1945   DATE OF PROCEDURE:  01/29/2007  DATE OF DISCHARGE:                               OPERATIVE REPORT   PREOPERATIVE DIAGNOSES:  1. Recurrent thrombosis, left forearm, arteriovenous graft.  2. End-stage renal disease.   POSTOPERATIVE DIAGNOSES:  1. Recurrent thrombosis, left forearm, arteriovenous graft.  2. End-stage renal disease.   OPERATION:  1. Bilateral ultrasound localization internal jugular veins.  2. Insertion Diatek catheter via right internal jugular vein (24 cm).   SURGEON:  Dr. Tinnie Gens.   FIRST ASSISTANT:  Nurse.   ANESTHESIA:  Local.   PROCEDURE:  The patient was taken to the operating room, placed in  supine position at which time the upper chest and neck were exposed.  Both internal jugular veins were imaged using B-mode ultrasound.  Both  noted to be widely patent with the right larger than the left.  After  infiltration with Xylocaine, prepping and draping in routine sterile  manner, the right internal jugular vein was entered using a  supraclavicular approach.  Guidewire passed into the right atrium under  fluoroscopic guidance.  After dilating the tract appropriately, a 24-cm  Diatek catheter was passed through a peel-away sheath, positioned in the  right atrium, tunneled peripherally and secured with nylon sutures.  Wound was closed with Vicryl in a subcuticular fashion.  Sterile  dressing applied.  The patient taken to the recovery room in  satisfactory condition.      Nelda Severe Kellie Simmering, M.D.  Electronically Signed     JDL/MEDQ  D:  01/29/2007  T:  01/30/2007  Job:  [id number]

## 2010-12-30 NOTE — Op Note (Signed)
NAMEINDERPREET, RIDENS              ACCOUNT NO.:  [id number]   MEDICAL RECORD NO.:  [id number]          PATIENT TYPE:  AMB   LOCATION:  SDS                          FACILITY:  [facility]   PHYSICIAN:  Nelda Severe. Kellie Simmering, M.D.  DATE OF BIRTH:  1946-04-16   DATE OF PROCEDURE:  03/02/2007  DATE OF DISCHARGE:                               OPERATIVE REPORT   PREOPERATIVE DIAGNOSIS:  End-stage renal disease.   POSTOPERATIVE DIAGNOSIS:  End-stage renal disease.   OPERATION:  Insertion of left upper arm brachial artery to axillary vein  AV Gore-Tex graft (6 mm).   SURGEON:  Nelda Severe. Kellie Simmering, M.D.   FIRST ASSISTANT:  Nurse.   ANESTHESIA:  Local.   PROCEDURE:  The patient was taken the operating room, placed in supine  position, at which time the left upper extremity was prepped with  Betadine scrub and solution, draped in a routine sterile manner.  After  infiltration of 1% Xylocaine, a longitudinal incision was made through  the previous scar in the distal upper arm, brachial artery was exposed,  encircled with Vesseloops.  It had an excellent pulse.  A second  incision was made just distal to the axilla.  Axillary vein was exposed  at its junction with the brachial vein.  It was a large, at least 5-mm  vein.  Curvilinear tunnel was then created on the anterior aspect of the  arm after infiltrating with 0.5% Xylocaine and a 6-mm Gore-Tex graft  delivered through the tunnel.  No heparin was given.  Brachial artery  was occluded proximally and distally, opened with a 15 blade, extended  with the Potts scissors.  Gore-Tex anastomosed end-to-side with 6-0  Prolene.  Following this, the brachial vein was ligated distally and  transected, and Gore-Tex was anastomosed end-to-end to the axillary vein  was 6-0 Prolene.  Clamps were then released and there was an excellent  pulse and palpable thrill in the graft, and good Doppler flow in the  wrist.  Wound was irrigated with saline.  Both closed in  layers with  Vicryl in a subcuticular fashion.  Sterile dressing applied.  The  patient taken to recovery room in satisfactory condition.      Nelda Severe Kellie Simmering, M.D.  Electronically Signed     JDL/MEDQ  D:  03/02/2007  T:  03/03/2007  Job:  [id number]

## 2010-12-30 NOTE — Op Note (Signed)
[name], Sarah Gilbert              ACCOUNT NO.:  [id number]   MEDICAL RECORD NO.:  [id number]          PATIENT TYPE:  AMB   LOCATION:  SDS                          FACILITY:  [facility]   PHYSICIAN:  Nelda Severe. Kellie Simmering, M.D.  DATE OF BIRTH:  Jul 19, 1946   DATE OF PROCEDURE:  10/12/2007  DATE OF DISCHARGE:  10/12/2007                               OPERATIVE REPORT   PREOPERATIVE DIAGNOSIS:  End-stage renal disease with thrombosed right  forearm arteriovenous Gore-Tex graft.   POSTOPERATIVE DIAGNOSIS:  End-stage renal disease with thrombosed right  forearm arteriovenous Gore-Tex graft.   OPERATIONS:  Thrombectomy, arteriovenous Gore-Tex graft, right forearm  with insertion of an interposition 6-mm Gore-Tex graft from existing  graft to more proximal basilic vein.   SURGEON:  Nelda Severe. Kellie Simmering, M.D.   FIRST ASSISTANT:  Jacinta Shoe, P.A.   ANESTHESIA:  Local.   PROCEDURE:  The patient was taken to the operating room, placed in  supine position at which time the right upper extremity was prepped with  Betadine scrub solution and draped in the routine sterile manner.  After  infiltration of 1% Xylocaine, a longitudinal incision was made through  the antecubital area.  The loop Gore-Tex graft which had been placed  seven weeks earlier was dissected free at the arterial and venous  anastomoses.  Arterial end was opened transversely adjacent to the  anastomosis and a organized core of material was removed from the  arterial end easily graft.  A Fogarty passed around the graft but would  not traverse the venous end beyond about 3 cm proximal to the  anastomosis to the vein.  After thoroughly irrigating with heparin  saline, the arterial end the graft was reclosed with two continuous 6-0  Prolene sutures.  Clamp released.  The venous end of the graft was  transected.  There was good flow out at the graft.  There was  irregularity in the basilic vein over a distance of about 4 to 5 cm,  necessitating a second incision in the distal upper arm over the basilic  vein.  It was dissected free, was a 5 mm vein.  A new piece of 6 mm  graft anastomosed end-to-end the old graft and end-to-side to the more  proximal vein with 6-0 Prolene.  Clamps then released.  There was an  excellent pulse and palpable thrill in the graft.  No protamine was  given.  The wounds irrigated with saline, closed in layers with Vicryl  in subcuticular fashion.  Sterile dressing applied.  The patient taken  to recovery room in satisfactory condition.      Nelda Severe Kellie Simmering, M.D.  Electronically Signed     JDL/MEDQ  D:  10/12/2007  T:  10/13/2007  Job:  [id number]

## 2010-12-30 NOTE — Op Note (Signed)
[name], Sarah Gilbert              ACCOUNT NO.:  [id number]   MEDICAL RECORD NO.:  [id number]          PATIENT TYPE:  AMB   LOCATION:  SDS                          FACILITY:  [facility]   PHYSICIAN:  VAnnamarie Major IV, MDDATE OF BIRTH:  Jan 28, 1946   DATE OF PROCEDURE:  02/10/2008  DATE OF DISCHARGE:  02/10/2008                               OPERATIVE REPORT   PREOPERATIVE DIAGNOSIS:  Thrombosed right upper arm graft.   POSTOPERATIVE DIAGNOSIS:  Thrombosed right upper arm graft.   PROCEDURE PERFORMED:  Thrombectomy of right upper arm graft.   ANESTHESIA:  MAC.   BLOOD LOSS:  400 mL.   FINDINGS:  Widely patent venous anastomosis, good arterial inflow.   PROCEDURE:  The patient inside the holding, and taken to room 6.  He was  placed supine on the table.  The right arm was prepped and draped in  standard sterile fashion.  Time-out was called.  Antibiotics were given.  The patient's upper arm incision and the axilla was anesthetized with 1%  lidocaine.  A 10-blade was used to incise the previous incision.  Cautery was used dissect through the subcutaneous tissue.  The graft was  then identified within the wound and encircled.  Sharp dissection was  proceeded around to fully expose the graft venous anastomosis.  At this  point,  vessels were placed proximally and distally on the vein as well  as around the graft.  A transverse graftotomy was made with a #11 blade  after the patient given systemic heparinization.  A #4 Fogarty catheter  was used to perform thrombectomy of the central venous system.  There  was good back bleeding.  The catheter went without resistance.  Next, a  4 and a 3 Fogarty were used to perform thrombectomy of the arterial  inflow.  After the arterial plug was removed, there was an excellent  bleeding.  I did have to transect the graft at the level of the venous  outflow tract in order to perform the thrombectomy.  Once I was  satisfied with thrombectomy and there  was a good thrill within the  graft, the 2 ends of the graft were back together using a 6-0 Prolene.  Once the anastomosis was secured, there was excellent thrill within the  graft.  The patient had radial and ulnar arterial Doppler signals.  The  wound was then irrigated and did not reverse the heparin.  Tissue was  closed with 3-0 and 4-0 Vicryl.  Dermabond was used.  There were no  complications.           ______________________________  V. Leia Alf, MD  Electronically Signed     VWB/MEDQ  D:  02/10/2008  T:  02/11/2008  Job:  [id number]

## 2010-12-30 NOTE — Assessment & Plan Note (Signed)
OFFICE VISIT   Schwager, Venda L  DOB:  [birth year]                                       09/06/2007  [id number]   I saw the patient in the office today for followup after placement of a  right forearm loop AV graft on 08/19/2007.  She had a fairly small  artery, and I used a 4 to 7 mm tapered graft.  She comes in for a  routine followup visit.   EXAMINATION:  Blood pressure is 120/64, heart rate is 69.  The incisions  have healed nicely in the right forearm.  The graft has an excellent  bruit and thrill.  She has a biphasic radial signal with a Doppler with  a monophasic ulnar signal and palmar arch signal.  The hand is pink and  appears adequately perfused.  She is complaining of some tingling in her  fingertips, and I think she has a mild steal.  Currently, the hand  appears adequately perfused and I have explained really the only option  would be removal of the graft.  I have encouraged her to continue to  exercise the hand, and to call if her symptoms do not resolve.  We will  see her back p.r.n.   Judeth Cornfield. Scot Dock, M.D.  Electronically Signed   CSD/MEDQ  D:  09/06/2007  T:  09/07/2007  Job:  [id number]

## 2010-12-30 NOTE — Assessment & Plan Note (Signed)
OFFICE VISIT   Sarah Gilbert, Sarah Gilbert  DOB:  Sep 01, 1945                                       03/09/2087  [id number]   I saw the patient in the office today concerning some paresthesias in  the left hand.  She had a new, left upper arm AV graft placed by Dr.  Kellie Simmering on 03/02/2007.  I believe this was a 4-7 mm tapered graft.  She  has had some problems with paresthesias in the left hand since then.  She had previously had some mild paresthesias when she had a functioning  forearm graft.   PHYSICAL EXAMINATION:  Blood pressure is 128/66.  Her upper arm graft on  the left has a good bruit and thrill.  She has a fairly brisk radial  signal at the Doppler with a dampened ulnar signal.  She has a palmar  arch signal with the Doppler.  Her signals do augment with impression of  her graft.   I think she does have a mild steal.  However, currently, her hand  appears adequately perfused with good motor function and good color and  temperature.  I have explained that, really, since she had a tapered  graft placed, the only options are to continue to ride this out, and,  hopefully, that it will gradually improve with time versus removal of  her graft.  She would like to continue to follow this for now, and will  let us know if her symptoms do not improve or her symptoms worsen.  I  have, again, given her a prescription for 30 Tylox for pain.   Judeth Cornfield. Scot Dock, M.D.  Electronically Signed   CSD/MEDQ  D:  03/09/2007  T:  03/10/2007  Job:  [id number]   cc:   [employer]

## 2010-12-30 NOTE — Op Note (Signed)
NAMEJONELL, BARGIEL              ACCOUNT NO.:  [id number]   MEDICAL RECORD NO.:  [id number]          PATIENT TYPE:  AMB   LOCATION:  SDS                          FACILITY:  [facility]   PHYSICIAN:  Judeth Cornfield. Scot Dock, M.D.DATE OF BIRTH:  1946/07/10   DATE OF PROCEDURE:  07/09/2007  DATE OF DISCHARGE:                               OPERATIVE REPORT   PREOPERATIVE DIAGNOSIS:  Clotted left upper arm arteriovenous graft.   POSTOPERATIVE DIAGNOSIS:  Clotted left upper arm arteriovenous graft.   PROCEDURE:  Thrombectomy of left upper arm AV graft with insertion of  new segment of graft in more proximal axillary vein.   SURGEON:  Judeth Cornfield. Scot Dock, M.D.   ASSISTANT:  Nurse.   ANESTHESIA:  Local with sedation.   TECHNIQUE:  The patient was taken to the operating room sedated by  anesthesia.  The left upper extremity was prepped and draped in the  usual sterile fashion.   After the skin was infiltrated with 1% lidocaine, the incision in the  axilla was opened and the venous limb of the graft dissected free.  The  graft was divided, and the patient was heparinized.  Graft thrombectomy  was achieved using a #4 Fogarty catheter.  The arterial plug was  retrieved.  Excellent flow was established to the graft which was  flushed with heparinized saline and clamped.  Next, the venous  anastomosis had some minimal hyperplasia and would only take a 4-mm  dilator.  I therefore elected to jump higher in the vein.  Through dense  scar tissue, I was able to get up above the area of the previous  anastomosis, although I was very high in the axilla.  The vein here was  clamped.  A 6-mm PTFE was brought to the field, spatulated, and sewn end-  to-end to the vein using continuous 6-0 Prolene suture.  The graft was  then pulled to the appropriate length and anastomosed end-to-end to the  old graft using continuous 6-0 Prolene suture.  At completion, there was  an excellent thrill in the graft.   Hemostasis was obtained in the wound.  The wound was closed with a deep layer of 3-0 Vicryl, and the skin  closed with 4-0 Vicryl.  A sterile dressing was applied.   The patient tolerated the procedure well and was transferred to the  recovery room in satisfactory condition.  All needle and sponge counts  were correct.      Judeth Cornfield. Scot Dock, M.D.  Electronically Signed     CSD/MEDQ  D:  07/09/2007  T:  07/10/2007  Job:  [id number]

## 2010-12-30 NOTE — Assessment & Plan Note (Signed)
OFFICE VISIT   Nygard, Yakelin L  DOB:  [birth year]                                       08/01/2007  [id number]   REASON FOR VISIT:  Evaluate for dialysis access.   HISTORY:  This is a 65 year old female who has had multiple accesses on  the left side and now currently dialyzes through a Diatek catheter that  was placed last week.  She comes in today for planning of a right-sided  fistula versus graft.   PHYSICAL EXAMINATION:  Vital signs:  Respirations are 18, pulse 62,  blood pressure 117/74.  In general, she is well-appearing in no acute  distress.  Right arm shows she has a palpable right radial pulse.  She  has palpable brachial pulses.   DIAGNOSTIC STUDIES:  Vein mapping reveals suboptimal cephalic vein.   ASSESSMENT AND PLAN:  End-stage renal disease in need of permanent  access.   PLAN:  Given that the patient does not have adequate vein for a fistula,  we will proceed with a right forearm loop graft.  This is scheduled to  be performed on January 2.  Risks and benefits were discussed with the  patient.  Informed consent will be signed the day of the procedure.   [name], MD  Electronically Signed   VWB/MEDQ  D:  08/01/2007  T:  08/02/2007  Job:  [id number]

## 2011-01-02 NOTE — Cardiovascular Report (Signed)
[place]  Patient:    Sarah Gilbert, Sarah Gilbert                     MRN: [id number] Adm. Date:  [id number] Attending:  Clent Demark CC:         [facility]                        Cardiac Catheterization  PROCEDURE:  Left cardiac catheterization with selective left and right coronary angiography, left ventriculography, aortography via right groin using Judkins technique.  INDICATION FOR PROCEDURE:  Sarah Gilbert is a 65 year old black female with a past medical history significant for hypertension, which is uncontrolled on three different medications, hypercholesterolemia, morbid obesity and positive family history of coronary artery disease, who complained of retrosternal chest heaviness associated with shortness of breath with minimal exertion, relieved with rest.  Patient denies any nausea, vomiting or diaphoresis.  Denies palpitations, light-headedness or syncope.  Denies PND, orthopnea or leg swelling.  Denies rest or nocturnal angina.  Patient underwent Persantine Cardiolite on March 30, 2000 which showed anterior wall reversible ischemia with ejection fraction of 66%.  Due to typical anginal pain, multiple risk factors and positive Persantine Cardiolite, patient was advised for catheterization and possible angioplasty.  PAST MEDICAL HISTORY:  History of hypertension for 30+ year.  History of cerebral hemorrhage in 1990.  Morbid obesity.  Hypercholesterolemia.  PAST SURGICAL HISTORY:  She had a hysterectomy in 1970 for fibroid tumor.  She had a tonsillectomy at age 54.  Appendectomy in 1980.  She also had cholecystectomy in 1980.  She had left breast surgery in 1999.  MEDICATIONS 1. Hyzaar 100/25 mg p.o. q.d. 2. Procardia XL 30 mg p.o. q.d. 3. K-Dur 20 mEq q.d. 4. Toprol XL 25 mg p.o. q.d. 5. Baby aspirin two tablets p.o. q.d.  ALLERGIES:  No known drug allergies.  SOCIAL HISTORY:  She is single and has no children.  No  history of alcohol or drug abuse.  She worked for [employer]; she is retired.  Born in [place].  FAMILY HISTORY:  Father died of CA of the lung; he had tobacco abuse.  Mother is alive; she is 67.  She has peptic ulcer disease and anemia.  One brother is in good health.  Her uncles have significant coronary artery disease; one of them had open heart surgery.  EXAMINATION:  On examination, she was alert, cooperative and oriented x 3, in no acute distress.  Blood pressure was 160/90.  Pulse was 70.  EYES: Conjunctivae were pink.  NECK:  Supple.  No JVD.  No bruits.  LUNGS:  Clear to auscultation, without rhonchi or rales.  CARDIOVASCULAR:  S1 and S2 were normal.  There was an S4 gallop at the apex.  ABDOMEN:  Soft.  Bowel sounds were present.  Nontender.  EXTREMITIES:  There was no clubbing, cyanosis, or edema.  ASSESSMENT:  Due to recurrent chest pain, positive Persantine Cardiolite and multiple risk factors, patient was advised for catheterization and possible angioplasty.  DESCRIPTION OF PROCEDURE:  After obtaining the informed consent, patient was brought to the catheterization lab and was placed on fluoroscopy table.  The right groin was prepped and draped in usual fashion.  Xylocaine 2% was used for local anesthesia in the right groin.  With the help of a thin-walled needle, a 6-French arterial sheath was placed.  Sheath was aspirated and flushed.  Next, a 6-French  left Judkins catheter was advanced over the wire under fluoroscopic guidance up to the ascending aorta.  Wire was pulled out. The catheter was aspirated and connected to the manifold.  Catheter was further advanced and engaged into the left coronary ostium.  Multiple views of the left system were taken.  Of note, patient had separate ostiums for LAD and left circumflex.  Next, the Judkins catheter was disengaged and was pulled out over the wire and was replaced with AL-1 catheter, which was advanced over  the wire under fluoroscopic guidance up to the ascending aorta.  Wire was pulled out.  The catheter was aspirated and connected to the manifold.  Catheter was further advanced and engaged into LAD.  Multiple views of this system were taken.  Next, the catheter was disengaged and was pulled out over the wire and was replaced with a 6-French right Judkins catheter, which was advanced over the wire under fluoroscopic guidance to the ascending aorta.  Wire was pulled out.  The catheter was aspirated and connected to the manifold.  Catheter was further advanced and engaged into the right coronary ostium.  Multiple views of the right system were taken.  Next, the catheter was disengaged and was pulled out over the wire and was replaced with a 6-French pigtail catheter, which was advanced over the wire under fluoroscopic guidance up to the ascending aorta.  Catheter was further advanced across the aortic valve into the LV.  LV pressures were recorded.  Next, left ventriculography was done in 30 degree RAO position.  Post-angiographic pressures were recorded from LV and then pullback pressures were recorded from the aorta.  There was no gradient across the aortic valve.  Next, the pigtail catheter was pulled down up to the descending aorta.  Aortography was done in PA position.  Next, the pigtail catheter was pulled out over the wire.  The sheaths were aspirated and flushed.  FINDINGS:  The patient has good LV systolic function.  Aortography revealed patent bilateral renal arteries.  Left main was absent.  Patient had separate ostiums for LAD and left circumflex.  LAD has 10-15% midstenosis.  The vessel is very tortuous.  Ramus has 20-30% distal stenosis, which was a medium-size vessel.  Left circumflex was dominant, which was large, and has 10-15% proximal stenosis.  OM-1, OM-2 and OM-3 were medium-size and were patent.  RCA was non-dominant, which was patent.  Arteriotomy was closed by  Perclose without complications.  Patient tolerated the procedure well.  There were no complications.  Patient was transferred to recovery room in stable condition. DD:  04/01/00  TD:  04/01/00 Job: [id number]

## 2011-01-02 NOTE — Op Note (Signed)
Sarah, Gilbert              ACCOUNT NO.:  [id number]   MEDICAL RECORD NO.:  [id number]          PATIENT TYPE:  AMB   LOCATION:  SDS                          FACILITY:  [facility]   PHYSICIAN:  Charles E. Fields, MD  DATE OF BIRTH:  1946/02/24   DATE OF PROCEDURE:  09/13/2006  DATE OF DISCHARGE:                               OPERATIVE REPORT   PROCEDURES:  1. Ultrasound of neck  2. Right internal jugular vein Diatek catheter.  3. Left forearm AV graft.   PREOPERATIVE DIAGNOSIS:  End-stage renal disease.   POSTOPERATIVE DIAGNOSIS:  End-stage renal disease.   ANESTHESIA:  Local with IV sedation.   ASSISTANT:  Kristen Dominick, PA-C.   OPERATIVE FINDINGS:  1. Patent right internal jugular vein.  2. A 23 cm Diatek catheter.  3. A 47 mm tapered PTFE graft.   DESCRIPTION OF PROCEDURE:  After obtaining informed consent, the patient  was taken to the operating.  The patient was placed in the supine  position on the operating room table.  Next, the patient's right neck  was inspected with a SiteRite ultrasound.  The right internal jugular  vein was found to be normal with patency and normal compressibility and  respiratory variation.  Next, the patient's entire neck and chest were  prepped and draped in the usual sterile fashion.  Local anesthesia was  infiltrated over the right internal jugular vein.  A small finder needle  was used to localize the right internal jugular vein.  A larger  introducer needle was then used to cannulate the right internal jugular  vein and a 0.035 J-tip guide wire threaded through the right internal  jugular vein and down into the inferior vena cava under fluoroscopic  guidance.  Next, sequential 12, 14 and 16-French dilators with peel-away  sheath placed over the guidewire into the right atrium.  Guidewire and  dilator were then removed.  A 23 cm Diatek catheter was then placed down  into the right atrium, tunneled subcutaneously, cut to length and  the  hub attached.  Catheter was noted to flush and draw easily.  Tip was in  the right atrium.  There were no kinks throughout its course.  Catheter  was sutured to the skin with nylon sutures.  Neck insertion site was  closed with Vicryl stitch.  Catheter was then loaded with concentrated  heparin solution.  Next, attention was turned to the patient's left arm.  The patient's entire left upper extremity was prepped and draped in the  usual sterile fashion.  Local anesthesia was infiltrated near the  antecubital crease.  Longitudinal incision was made in this location,  carried down through subcutaneous tissues down through the brachial  artery.  An adjacent deep brachial vein was also dissected free  circumferentially.  Vein was approximately 2.5-3 mm in diameter.  The  artery was 4 mm in diameter.  Next, a subcutaneous tunnel was created in  a loop configuration in the forearm with a transverse incision in the  distal forearm for assistance in tunneling.  The arterial limb of the  graft was on the ulnar  aspect of the forearm.   Next, the patient was given 5000 units of intravenous heparin.  Vessel  loops were pulled taut on the brachial artery proximally and distally.  Longitudinal arteriotomy was made.  A small portion of the 4 mm end of  the graft was trimmed away and beveled.  This was then sewn end of graft  to side of artery using a running 6-0 Prolene suture.  Just prior  completion of the anastomosis, this was forebled, backbled and  thoroughly flushed.  Anastomosis was secured, clamps released.  There  was good pulsatile flow into the graft.  The graft was then clamped at  its mid section with a fistula clamp.  Venous limb of the graft was then  pulled taut to length.  The vein was controlled proximally and distally  with fine bulldog clamps.  Longitudinal venotomy was made.  The graft  was then beveled and sewn end graft to side of vein using a running 6-0  Prolene suture.   Just prior to completion of the anastomosis, this was  forebled, backbled and thoroughly flushed.  Anastomosis was secured,  clamps were released, there was a palpable thrill above the graft  immediately.  Next, hemostasis was obtained with 50 mg of protamine and  a few repair sutures.  The subcutaneous tissues of both incisions were  closed with running 3-0 Vicryl suture.  Skin of both incisions then  closed with 4-0 Vicryl subcuticular stitch.  The patient tolerated the  procedure well and there were no complications.  Instrument, sponge and  needle counts correct at the end of the case.  The patient had an  audible radial Doppler signal which augmented minimally on compression  of the graft.  The patient tolerated the procedure well and there were  no complications.  The patient taken to the recovery room in stable  condition.      Jessy Oto. Fields, MD  Electronically Signed     CEF/MEDQ  D:  09/13/2006  T:  09/13/2006  Job:  [id number]

## 2011-01-02 NOTE — Op Note (Signed)
Sarah Gilbert, Sarah Gilbert              ACCOUNT NO.:  [id number]   MEDICAL RECORD NO.:  [id number]          PATIENT TYPE:  AMB   LOCATION:  SDS                          FACILITY:  [facility]   PHYSICIAN:  Nelda Severe. Kellie Simmering, M.D.  DATE OF BIRTH:  1945-08-27   DATE OF PROCEDURE:  12/08/2006  DATE OF DISCHARGE:                               OPERATIVE REPORT   PREOPERATIVE DIAGNOSIS:  Thrombosed AV Gore-Tex graft left forearm with  end-stage renal disease.   POSTOPERATIVE DIAGNOSIS:  Thrombosed AV Gore-Tex graft left forearm with  end-stage renal disease.   OPERATION:  Thrombectomy AV Gore-Tex graft left forearm with insertion  new segment of graft from existing graft to brachial vein.   SURGEON:  Nelda Severe. Kellie Simmering, M.D.   FIRST ASSISTANT:  Nurse.   ANESTHESIA:  Local.   PROCEDURE:  The patient was taken to the operating room, placed in  supine position at which time the left upper extremity was prepped with  Betadine scrub and solution and draped in routine sterile manner.  After  infiltration of 1% Xylocaine longitudinal incision was made through the  antecubital area.  Gore-Tex to brachial vein dissected free.  The graft  was thrombosed.  3000 units of heparin given intravenously.  Transverse  opening made in the graft, graft itself easily thrombectomized,  excellent flow being reestablished.  There was definite intimal  hyperplasia almost obstructing the venous anastomosis.  Vein was  dissected free more proximally where it was 4.5 to 5 mm in size.  A new  piece 6 mm graft anastomosed end-to-end to the old graft and end-to-end  to the proximal vein with 6-0 Prolene.  Clamps then released.  There was  an excellent pulse and thrill in the graft.  No protamine was given,  wound was irrigated with saline, closed in layers with Vicryl  subcuticular fashion.  Sterile dressing applied.  The patient taken to  recovery room in satisfactory condition.      Nelda Severe Kellie Simmering, M.D.  Electronically  Signed     JDL/MEDQ  D:  12/08/2006  T:  12/08/2006  Job:  [id number]

## 2011-01-02 NOTE — Op Note (Signed)
Sarah Gilbert, Sarah Gilbert              ACCOUNT NO.:  [id number]   MEDICAL RECORD NO.:  [id number]          PATIENT TYPE:  AMB   LOCATION:  SDS                          FACILITY:  [facility]   PHYSICIAN:  Rosetta Posner, M.D.    DATE OF BIRTH:  1946/02/25   DATE OF PROCEDURE:  12/17/2006  DATE OF DISCHARGE:                               OPERATIVE REPORT   PREOPERATIVE DIAGNOSIS:  End stage renal disease with occluded left  forearm loop arteriovenous Gore-Tex graft.   POSTOPERATIVE DIAGNOSIS:  End stage renal disease with occluded left  forearm loop arteriovenous Gore-Tex graft.   PROCEDURE:  Thrombectomy and interposition jump graft revision to  basilic vein of left forearm loop arteriovenous Gore-Tex graft.   SURGEON:  Rosetta Posner, M.D.   ASSISTANT:  Cyril Mourning Dominick, P.A.-C.   ANESTHESIA:  MAC.   COMPLICATIONS:  None.   CONDITION:  Stable.   DESCRIPTION OF PROCEDURE:  The patient was taken to the operating room  and placed on the table where the area of the left arm was prepped and  draped in the usual sterile fashion.  An incision was made over the  prior revision.  This had been to the brachial vein.  The graft was  opened near the brachial vein anastomosis.  It was thrombectomized.  There was a moderate stenosis with a sclerotic vein.  For this reason, a  separate incision was made over the basilic vein on the more medial  aspect of the forearm and the basilic vein, itself, was of good caliber.  The graft was thrombectomized.  The arterialized plug was removed and  good inflow was encountered.  This was flushed with heparinized saline  and reoccluded.  A new interposition graft of 7 mm Gore-Tex was brought  onto the field, the basilic vein was occluded proximally and distally,  was opened with an 11 blade and extended with Potts scissors.  The graft  was spatulated and sewn end-to-side to the vein with running 6-0 Prolene  suture.  The graft was flushed with heparinized saline  and reoccluded.  Next, the graft was brought through a subcutaneous tunnel to the area of  the old graft.  The old graft was divided and the new interposition was  cut to the appropriate length and sewn  end-to-end to the old graft with running 6-0 Prolene suture.  The clamps  were removed and an excellent thrill was noted.  The wound was irrigated  with saline and hemostasis was with electrocautery.  The wound was  closed 3-0 with Vicryl in the subcutaneous and subcuticular tissue.  Benzoin and Steri-Strips were applied.      Rosetta Posner, M.D.  Electronically Signed     TFE/MEDQ  D:  12/17/2006  T:  12/17/2006  Job:  [id number]

## 2011-03-24 ENCOUNTER — Emergency Department (HOSPITAL_COMMUNITY)
Admission: EM | Admit: 2011-03-24 | Discharge: 2011-03-24 | Disposition: A | Discharge status: Home / Self Care | Payer: Medicare Other | Attending: Emergency Medicine | Admitting: Emergency Medicine

## 2011-03-24 ENCOUNTER — Emergency Department (HOSPITAL_COMMUNITY): Payer: Medicare Other

## 2011-03-24 DIAGNOSIS — X500XXA Overexertion from strenuous movement or load, initial encounter: Secondary | ICD-10-CM | POA: Insufficient documentation

## 2011-03-24 DIAGNOSIS — M25569 Pain in unspecified knee: Secondary | ICD-10-CM | POA: Insufficient documentation

## 2011-03-24 DIAGNOSIS — M25469 Effusion, unspecified knee: Secondary | ICD-10-CM | POA: Insufficient documentation

## 2011-03-24 DIAGNOSIS — Z940 Kidney transplant status: Secondary | ICD-10-CM | POA: Insufficient documentation

## 2011-03-24 DIAGNOSIS — IMO0002 Reserved for concepts with insufficient information to code with codable children: Secondary | ICD-10-CM | POA: Insufficient documentation

## 2011-03-24 DIAGNOSIS — Y9229 Other specified public building as the place of occurrence of the external cause: Secondary | ICD-10-CM | POA: Insufficient documentation

## 2011-05-06 LAB — POCT I-STAT 4, (NA,K, GLUC, HGB,HCT)
Glucose, Bld: 74
HCT: 46
Hemoglobin: 15.6 — ABNORMAL HIGH
Operator id: 181601
Potassium: 4.4
Sodium: 136

## 2011-05-08 LAB — POCT I-STAT 4, (NA,K, GLUC, HGB,HCT)
Glucose, Bld: 67 — ABNORMAL LOW
HCT: 55 — ABNORMAL HIGH
Hemoglobin: 18.7 — ABNORMAL HIGH
Operator id: 206361
Potassium: 4.3
Sodium: 138

## 2011-05-11 LAB — POCT I-STAT 4, (NA,K, GLUC, HGB,HCT)
Glucose, Bld: 62 — ABNORMAL LOW
Glucose, Bld: 69 — ABNORMAL LOW
HCT: 41
HCT: 42
Hemoglobin: 13.9
Hemoglobin: 14.3
Operator id: 159351
Operator id: 181601
Potassium: 4.2
Potassium: 4.3
Sodium: 136
Sodium: 139

## 2011-05-12 LAB — POCT I-STAT 4, (NA,K, GLUC, HGB,HCT)
Glucose, Bld: 64 — ABNORMAL LOW
HCT: 46
Hemoglobin: 15.6 — ABNORMAL HIGH
Operator id: 181601
Potassium: 4.2
Sodium: 138

## 2011-05-14 LAB — POCT I-STAT 4, (NA,K, GLUC, HGB,HCT)
Glucose, Bld: 69 — ABNORMAL LOW
HCT: 38
Hemoglobin: 12.9
Operator id: 206361
Potassium: 3.8
Sodium: 140

## 2011-05-25 LAB — POCT I-STAT 4, (NA,K, GLUC, HGB,HCT)
Glucose, Bld: 68 — ABNORMAL LOW
HCT: 42
Hemoglobin: 14.3
Operator id: 181601
Potassium: 5.2 — ABNORMAL HIGH
Sodium: 136

## 2011-05-26 LAB — POCT I-STAT 4, (NA,K, GLUC, HGB,HCT)
Glucose, Bld: 61 — ABNORMAL LOW
Glucose, Bld: 65 — ABNORMAL LOW
HCT: 42
HCT: 41
Hemoglobin: 14.3
Hemoglobin: 13.9
Operator id: 140821
Operator id: 181601
Potassium: 5.3 — ABNORMAL HIGH
Potassium: 4.9
Sodium: 137
Sodium: 136

## 2011-05-26 LAB — POTASSIUM: Potassium: 5.9 — ABNORMAL HIGH

## 2011-06-01 LAB — POCT I-STAT 4, (NA,K, GLUC, HGB,HCT)
Glucose, Bld: 76
HCT: 43
Hemoglobin: 14.6
Operator id: 181601
Potassium: 4.4
Sodium: 137

## 2011-06-04 LAB — TISSUE CULTURE
Culture: NO GROWTH
Gram Stain: NONE SEEN

## 2011-06-04 LAB — POCT I-STAT 4, (NA,K, GLUC, HGB,HCT)
Glucose, Bld: 63 — ABNORMAL LOW
Glucose, Bld: 64 — ABNORMAL LOW
Glucose, Bld: 73
Glucose, Bld: 85
HCT: 41
HCT: 43
HCT: 44
HCT: 46
Hemoglobin: 13.9
Hemoglobin: 14.6
Hemoglobin: 15
Hemoglobin: 15.6 — ABNORMAL HIGH
Operator id: 181601
Operator id: 238831
Operator id: 238831
Operator id: 274481
Potassium: 4.4
Potassium: 5
Potassium: 5.1
Potassium: 5.2 — ABNORMAL HIGH
Sodium: 133 — ABNORMAL LOW
Sodium: 134 — ABNORMAL LOW
Sodium: 138
Sodium: 138

## 2011-07-13 ENCOUNTER — Other Ambulatory Visit: Payer: Self-pay | Admitting: Cardiology

## 2012-01-27 DIAGNOSIS — D849 Immunodeficiency, unspecified: Secondary | ICD-10-CM | POA: Insufficient documentation

## 2012-01-27 DIAGNOSIS — I10 Essential (primary) hypertension: Secondary | ICD-10-CM | POA: Insufficient documentation

## 2012-01-27 DIAGNOSIS — K5900 Constipation, unspecified: Secondary | ICD-10-CM | POA: Insufficient documentation

## 2012-01-27 DIAGNOSIS — E669 Obesity, unspecified: Secondary | ICD-10-CM | POA: Insufficient documentation

## 2012-01-27 DIAGNOSIS — E7800 Pure hypercholesterolemia, unspecified: Secondary | ICD-10-CM | POA: Insufficient documentation

## 2012-03-15 ENCOUNTER — Other Ambulatory Visit (HOSPITAL_COMMUNITY): Payer: Self-pay | Admitting: Internal Medicine

## 2012-03-15 DIAGNOSIS — Z1231 Encounter for screening mammogram for malignant neoplasm of breast: Secondary | ICD-10-CM

## 2012-03-28 ENCOUNTER — Ambulatory Visit: Payer: 59 | Admitting: *Deleted

## 2012-04-04 ENCOUNTER — Ambulatory Visit (HOSPITAL_COMMUNITY)
Admission: RE | Admit: 2012-04-04 | Discharge: 2012-04-04 | Disposition: A | Discharge status: Home / Self Care | Payer: Medicare Other | Source: Ambulatory Visit | Attending: Internal Medicine | Admitting: Internal Medicine

## 2012-04-04 DIAGNOSIS — Z1231 Encounter for screening mammogram for malignant neoplasm of breast: Secondary | ICD-10-CM

## 2012-04-07 ENCOUNTER — Other Ambulatory Visit: Payer: Self-pay | Admitting: Internal Medicine

## 2012-04-07 DIAGNOSIS — R928 Other abnormal and inconclusive findings on diagnostic imaging of breast: Secondary | ICD-10-CM

## 2012-04-11 ENCOUNTER — Ambulatory Visit
Admission: RE | Admit: 2012-04-11 | Discharge: 2012-04-11 | Disposition: A | Discharge status: Home / Self Care | Payer: 59 | Source: Ambulatory Visit | Attending: Internal Medicine | Admitting: Internal Medicine

## 2012-04-11 ENCOUNTER — Ambulatory Visit
Admission: RE | Admit: 2012-04-11 | Discharge: 2012-04-11 | Disposition: A | Discharge status: Home / Self Care | Payer: Medicare Other | Source: Ambulatory Visit | Attending: Internal Medicine | Admitting: Internal Medicine

## 2012-04-11 DIAGNOSIS — R928 Other abnormal and inconclusive findings on diagnostic imaging of breast: Secondary | ICD-10-CM

## 2013-01-28 ENCOUNTER — Emergency Department (HOSPITAL_COMMUNITY): Payer: Medicare Other

## 2013-01-28 ENCOUNTER — Encounter (HOSPITAL_COMMUNITY): Payer: Self-pay | Admitting: Family Medicine

## 2013-01-28 ENCOUNTER — Emergency Department (HOSPITAL_COMMUNITY)
Admission: EM | Admit: 2013-01-28 | Discharge: 2013-01-28 | Disposition: A | Discharge status: Home / Self Care | Payer: Medicare Other | Attending: Emergency Medicine | Admitting: Emergency Medicine

## 2013-01-28 DIAGNOSIS — T44901A Poisoning by unspecified drugs primarily affecting the autonomic nervous system, accidental (unintentional), initial encounter: Secondary | ICD-10-CM | POA: Insufficient documentation

## 2013-01-28 DIAGNOSIS — I129 Hypertensive chronic kidney disease with stage 1 through stage 4 chronic kidney disease, or unspecified chronic kidney disease: Secondary | ICD-10-CM | POA: Insufficient documentation

## 2013-01-28 DIAGNOSIS — R519 Headache, unspecified: Secondary | ICD-10-CM

## 2013-01-28 DIAGNOSIS — Y9289 Other specified places as the place of occurrence of the external cause: Secondary | ICD-10-CM | POA: Insufficient documentation

## 2013-01-28 DIAGNOSIS — Z862 Personal history of diseases of the blood and blood-forming organs and certain disorders involving the immune mechanism: Secondary | ICD-10-CM | POA: Insufficient documentation

## 2013-01-28 DIAGNOSIS — Z79899 Other long term (current) drug therapy: Secondary | ICD-10-CM | POA: Insufficient documentation

## 2013-01-28 DIAGNOSIS — W19XXXA Unspecified fall, initial encounter: Secondary | ICD-10-CM

## 2013-01-28 DIAGNOSIS — E785 Hyperlipidemia, unspecified: Secondary | ICD-10-CM | POA: Insufficient documentation

## 2013-01-28 DIAGNOSIS — S0990XA Unspecified injury of head, initial encounter: Secondary | ICD-10-CM | POA: Insufficient documentation

## 2013-01-28 DIAGNOSIS — S0993XA Unspecified injury of face, initial encounter: Secondary | ICD-10-CM | POA: Insufficient documentation

## 2013-01-28 DIAGNOSIS — N186 End stage renal disease: Secondary | ICD-10-CM | POA: Insufficient documentation

## 2013-01-28 DIAGNOSIS — Z7982 Long term (current) use of aspirin: Secondary | ICD-10-CM | POA: Insufficient documentation

## 2013-01-28 DIAGNOSIS — S199XXA Unspecified injury of neck, initial encounter: Secondary | ICD-10-CM | POA: Insufficient documentation

## 2013-01-28 DIAGNOSIS — S46909A Unspecified injury of unspecified muscle, fascia and tendon at shoulder and upper arm level, unspecified arm, initial encounter: Secondary | ICD-10-CM | POA: Insufficient documentation

## 2013-01-28 DIAGNOSIS — S4980XA Other specified injuries of shoulder and upper arm, unspecified arm, initial encounter: Secondary | ICD-10-CM | POA: Insufficient documentation

## 2013-01-28 DIAGNOSIS — Z8639 Personal history of other endocrine, nutritional and metabolic disease: Secondary | ICD-10-CM | POA: Insufficient documentation

## 2013-01-28 DIAGNOSIS — Y9389 Activity, other specified: Secondary | ICD-10-CM | POA: Insufficient documentation

## 2013-01-28 HISTORY — DX: Obesity, unspecified: E66.9

## 2013-01-28 HISTORY — DX: Disorder of thyroid, unspecified: E07.9

## 2013-01-28 HISTORY — DX: End stage renal disease: N18.6

## 2013-01-28 HISTORY — DX: Essential (primary) hypertension: I10

## 2013-01-28 HISTORY — DX: Anemia, unspecified: D64.9

## 2013-01-28 HISTORY — DX: Hyperlipidemia, unspecified: E78.5

## 2013-01-28 MED ORDER — OXYCODONE-ACETAMINOPHEN 5-325 MG PO TABS
2.0000 | ORAL_TABLET | Freq: Once | ORAL | Status: AC
  Administered 2013-01-28: 2 via ORAL
  Filled 2013-01-28: qty 2

## 2013-01-28 MED ORDER — HYDROCODONE-ACETAMINOPHEN 5-325 MG PO TABS: 2.0000 | ORAL_TABLET | Freq: Four times a day (QID) | ORAL | Status: DC | PRN

## 2013-01-28 NOTE — ED Notes (Signed)
Per pt tripped over phone cord and fell into freezer. sts hit her face. Pt having lip pain, left sided face pain., HA and left shoulder pain.

## 2013-01-28 NOTE — ED Provider Notes (Signed)
History     CSN: [id number]  Arrival date & time 01/28/13  31   First MD Initiated Contact with Patient 01/28/13 1713      Chief Complaint  Patient presents with  . Fall    (Consider location/radiation/quality/duration/timing/severity/associated sxs/prior treatment) HPI Comments: Patient presents emergency department with chief complaint of fall. She states that she tripped over her phone cord, and fell into her freezer. She states that she hit her face. She endorses face pain and mild headache. She also states that she has some neck pain that radiates to her left shoulder. States the pain is moderate in severity. She has not tried anything to alleviate her symptoms.  The history is provided by the patient. No language interpreter was used.    Past Medical History  Diagnosis Date  . Hypertension   . Hyperlipemia   . ESRD (end stage renal disease)   . Obesity   . Thyroid disease   . Anemia     Past Surgical History  Procedure Laterality Date  . Cholecystectomy    . Appendectomy    . Abdominal hysterectomy    . Tonsillectomy      History reviewed. No pertinent family history.  History  Substance Use Topics  . Smoking status: Never Smoker   . Smokeless tobacco: Not on file  . Alcohol Use: No    OB History   Grav Para Term Preterm Abortions TAB SAB Ect Mult Living                  Review of Systems  All other systems reviewed and are negative.    Allergies  Iohexol and Vancomycin  Home Medications   Current Outpatient Rx  Name  Route  Sig  Dispense  Refill  . amLODipine (NORVASC) 5 MG tablet   Oral   Take 5 mg by mouth daily.         Marland Kitchen aspirin EC 81 MG tablet   Oral   Take 81 mg by mouth daily.         Marland Kitchen b complex-vitamin c-folic acid (NEPHRO-VITE) 0.8 MG TABS   Oral   Take 0.8 mg by mouth at bedtime.         . furosemide (LASIX) 40 MG tablet   Oral   Take 40 mg by mouth 2 (two) times daily as needed (fluid).         . metoprolol  (LOPRESSOR) 50 MG tablet   Oral   Take 50 mg by mouth 2 (two) times daily.         . mycophenolate (CELLCEPT) 250 MG capsule   Oral   Take 500 mg by mouth 2 (two) times daily.         . pravastatin (PRAVACHOL) 20 MG tablet   Oral   Take 20 mg by mouth at bedtime.         . ranitidine (ZANTAC) 150 MG tablet   Oral   Take 150 mg by mouth daily.         . tacrolimus (PROGRAF) 1 MG capsule   Oral   Take 3 mg by mouth 2 (two) times daily.           BP 138/97  Pulse 83  Temp(Src) 98.1 F (36.7 C) (Oral)  Resp 16  SpO2 95%  Physical Exam  Nursing note and vitals reviewed. Constitutional: She is oriented to person, place, and time. She appears well-developed and well-nourished.  HENT:  Head: Normocephalic and atraumatic.  Left cheek mildly swollen below the eye, upper lip is swollen and bruised, but without laceration, teeth are not broken  Eyes: Conjunctivae and EOM are normal. Pupils are equal, round, and reactive to light.  Neck: Normal range of motion. Neck supple.  Cardiovascular: Normal rate and regular rhythm.  Exam reveals no gallop and no friction rub.   No murmur heard. Pulmonary/Chest: Effort normal and breath sounds normal. No respiratory distress. She has no wheezes. She has no rales. She exhibits no tenderness.  Abdominal: Soft. Bowel sounds are normal. She exhibits no distension and no mass. There is no tenderness. There is no rebound and no guarding.  Musculoskeletal: Normal range of motion. She exhibits no edema and no tenderness.  Neurological: She is alert and oriented to person, place, and time.  CN 3-12 intact, sensation and strength intact  Skin: Skin is warm and dry.  Psychiatric: She has a normal mood and affect. Her behavior is normal. Judgment and thought content normal.    ED Course  Procedures (including critical care time)  Results for orders placed during the hospital encounter of 05/16/10  URINE CULTURE      Result Value Range    Specimen Description URINE, RANDOM     Special Requests [id number] ON [id number] @[id number]      Culture  Setup Time [id number]     Colony Count NO GROWTH     Culture NO GROWTH     Report Status 05/17/2010 FINAL    CARDIAC PANEL(CRET KIN+CKTOT+MB+TROPI)      Result Value Range   Total CK 69  7 - 177 U/L   CK, MB 0.9  0.3 - 4.0 ng/mL   Troponin I    0.00 - 0.06 ng/mL   Value: 0.02            NO INDICATION OF     MYOCARDIAL INJURY.   Relative Index    0.0 - 2.5   Value: RELATIVE INDEX IS INVALID     WHEN CK < 100 U/L             D-DIMER, QUANTITATIVE      Result Value Range   D-Dimer, Quant   (*) 0.00 - 0.48 ug/mL-FEU   Value: 1.05            AT THE INHOUSE ESTABLISHED CUTOFF     VALUE OF 0.48 ug/mL FEU,     THIS ASSAY HAS BEEN DOCUMENTED     IN THE LITERATURE TO HAVE     A SENSITIVITY AND NEGATIVE     PREDICTIVE VALUE OF AT LEAST     98 TO 99%.  THE TEST RESULT     SHOULD BE CORRELATED WITH     AN ASSESSMENT OF THE CLINICAL     PROBABILITY OF DVT / VTE.  HEPATIC FUNCTION PANEL      Result Value Range   Total Protein 6.4  6.0 - 8.3 g/dL   Albumin 4.0  3.5 - 5.2 g/dL   AST 57 (*) 0 - 37 U/L   ALT 31  0 - 35 U/L   Alkaline Phosphatase 82  39 - 117 U/L   Total Bilirubin 0.6  0.3 - 1.2 mg/dL   Bilirubin, Direct 0.2  0.0 - 0.3 mg/dL   Indirect Bilirubin 0.4  0.3 - 0.9 mg/dL  CREATININE, URINE, RANDOM      Result Value Range   Creatinine, Urine 18.5    SODIUM, URINE, RANDOM      Result Value Range  Sodium, Ur 129    VITAMIN B12      Result Value Range   Vitamin B-12 903  211 - 911 pg/mL  CARDIAC PANEL(CRET KIN+CKTOT+MB+TROPI)      Result Value Range   Total CK 63  7 - 177 U/L   CK, MB 0.8  0.3 - 4.0 ng/mL   Troponin I    0.00 - 0.06 ng/mL   Value: 0.02            NO INDICATION OF     MYOCARDIAL INJURY.   Relative Index    0.0 - 2.5   Value: RELATIVE INDEX IS INVALID     WHEN CK < 100 U/L             CBC      Result Value Range   WBC 3.3 (*) 4.0 - 10.5 K/uL   RBC 3.67  (*) 3.87 - 5.11 MIL/uL   Hemoglobin 11.3 (*) 12.0 - 15.0 g/dL   HCT 34.8 (*) 36.0 - 46.0 %   MCV 94.8  78.0 - 100.0 fL   MCH 30.8  26.0 - 34.0 pg   MCHC 32.5  30.0 - 36.0 g/dL   RDW 13.1  11.5 - 15.5 %   Platelets 163  150 - 400 K/uL  COMPREHENSIVE METABOLIC PANEL      Result Value Range   Sodium 138  135 - 145 mEq/L   Potassium 3.3 (*) 3.5 - 5.1 mEq/L   Chloride 102  96 - 112 mEq/L   CO2 27  19 - 32 mEq/L   Glucose, Bld 83  70 - 99 mg/dL   BUN 8  6 - 23 mg/dL   Creatinine, Ser 0.85  0.4 - 1.2 mg/dL   Calcium 9.8  8.4 - 10.5 mg/dL   Total Protein 6.1  6.0 - 8.3 g/dL   Albumin 3.9  3.5 - 5.2 g/dL   AST 76 (*) 0 - 37 U/L   ALT 42 (*) 0 - 35 U/L   Alkaline Phosphatase 94  39 - 117 U/L   Total Bilirubin 0.8  0.3 - 1.2 mg/dL   GFR calc non Af Amer >60  >60 mL/min   GFR calc Af Amer    >60 mL/min   Value: >60            The eGFR has been calculated     using the MDRD equation.     This calculation has not been     validated in all clinical     situations.     eGFR's persistently     <60 mL/min signify     possible Chronic Kidney Disease.  IRON AND TIBC      Result Value Range   Iron 32 (*) 42 - 135 ug/dL   TIBC 214 (*) 250 - 470 ug/dL   Saturation Ratios 15 (*) 20 - 55 %   UIBC 182    FERRITIN      Result Value Range   Ferritin 493 (*) 10 - 291 ng/mL  FOLATE      Result Value Range   Folate       Value: >20.0     (NOTE)  Reference Ranges        Deficient:       0.4 - 3.3 ng/mL        Indeterminate:   3.4 - 5.4 ng/mL        Normal:              >  5.4 ng/mL  MAGNESIUM      Result Value Range   Magnesium 1.2 (*) 1.5 - 2.5 mg/dL  PHOSPHORUS      Result Value Range   Phosphorus 3.3  2.3 - 4.6 mg/dL  RETICULOCYTES      Result Value Range   Retic Ct Pct 1.2  0.4 - 3.1 %   RBC. 3.63 (*) 3.87 - 5.11 MIL/uL   Retic Count, Manual 43.6  19.0 - 186.0 K/uL  LIPID PANEL      Result Value Range   Cholesterol    0 - 200 mg/dL   Value: 145            ATP III CLASSIFICATION:       <200     mg/dL   Desirable      200-239  mg/dL   Borderline High      >=240    mg/dL   High              Triglycerides 56  <150 mg/dL   HDL 61  >39 mg/dL   Total CHOL/HDL Ratio 2.4     VLDL 11  0 - 40 mg/dL   LDL Cholesterol    0 - 99 mg/dL   Value: 73            Total Cholesterol/HDL:CHD Risk     Coronary Heart Disease Risk Table                         Men   Women      1/2 Average Risk   3.4   3.3      Average Risk       5.0   4.4      2 X Average Risk   9.6   7.1      3 X Average Risk  23.4   11.0                Use the calculated Patient Ratio     above and the CHD Risk Table     to determine the patient's CHD Risk.                ATP III CLASSIFICATION (LDL):      <100     mg/dL   Optimal      100-129  mg/dL   Near or Above                        Optimal      130-159  mg/dL   Borderline      160-189  mg/dL   High      >190     mg/dL   Very High  CREATININE, URINE, RANDOM      Result Value Range   Creatinine, Urine 43.0    PROTEIN / CREATININE RATIO, URINE      Result Value Range   Creatinine, Urine       Value: 46.4     CORRECTED ON 10/01 AT 2044: PREVIOUSLY REPORTED AS 43.1   Total Protein, Urine       Value: 2 No reference range established for random urine.     CORRECTED ON 10/01 AT 2044: PREVIOUSLY REPORTED AS <6 NO NORMAL RANGE ESTABLISHED FOR THIS TEST   PROTEIN CREATININE RATIO    <0.15   Value: 0.04     CORRECTED ON 10/01 AT 2044: PREVIOUSLY REPORTED AS  RESULT BELOW REPORTABLE RANGE, UNABLE TO CALCULATE.  ANTI-NUCLEAR AB-TITER (ANA TITER)      Result Value Range   ANA Titer 1    <1:40   Value: NEGATIVE     (NOTE)  Reference Ranges: 1:40 - 1:80 Weakly positive, usually not clinically significant. > or = to 1:160 Result may be clinically significant. Due to differences in methodologies, results may differ between the ANA screen and the Reflex IFA titer and      pattern.   ANA Pattern 1       Value: (NOTE)  Pattern not applicable due to a negative  titer result.  ANA      Result Value Range   ANA POSITIVE (*) NEGATIVE  CBC      Result Value Range   WBC 3.1 (*) 4.0 - 10.5 K/uL   RBC 3.73 (*) 3.87 - 5.11 MIL/uL   Hemoglobin 11.4 (*) 12.0 - 15.0 g/dL   HCT 35.1 (*) 36.0 - 46.0 %   MCV 94.1  78.0 - 100.0 fL   MCH 30.6  26.0 - 34.0 pg   MCHC 32.5  30.0 - 36.0 g/dL   RDW 13.1  11.5 - 15.5 %   Platelets 162  150 - 400 K/uL  COMPREHENSIVE METABOLIC PANEL      Result Value Range   Sodium 137  135 - 145 mEq/L   Potassium 3.3 (*) 3.5 - 5.1 mEq/L   Chloride 101  96 - 112 mEq/L   CO2 28  19 - 32 mEq/L   Glucose, Bld 96  70 - 99 mg/dL   BUN 8  6 - 23 mg/dL   Creatinine, Ser 0.89  0.4 - 1.2 mg/dL   Calcium 9.4  8.4 - 10.5 mg/dL   Total Protein 6.0  6.0 - 8.3 g/dL   Albumin 3.7  3.5 - 5.2 g/dL   AST 35  0 - 37 U/L   ALT 29  0 - 35 U/L   Alkaline Phosphatase 75  39 - 117 U/L   Total Bilirubin 0.5  0.3 - 1.2 mg/dL   GFR calc non Af Amer >60  >60 mL/min   GFR calc Af Amer    >60 mL/min   Value: >60            The eGFR has been calculated     using the MDRD equation.     This calculation has not been     validated in all clinical     situations.     eGFR's persistently     <60 mL/min signify     possible Chronic Kidney Disease.  HEPATITIS PANEL, ACUTE      Result Value Range   Hepatitis B Surface Ag NEGATIVE  NEGATIVE   HCV Ab NEGATIVE  NEGATIVE   Hep A IgM NEGATIVE  NEGATIVE   Hep B C IgM    NEGATIVE   Value: NEGATIVE     (NOTE) High levels of Hepatitis B Core IgM antibody are detectable during the acute stage of Hepatitis B. This antibody is used to differentiate current from past HBV infection.   MAGNESIUM      Result Value Range   Magnesium 1.6  1.5 - 2.5 mg/dL  PROTIME-INR      Result Value Range   Prothrombin Time 17.8 (*) 11.6 - 15.2 seconds   INR 1.45  0.00 - 1.49  BRAIN NATRIURETIC PEPTIDE      Result Value Range   Pro B Natriuretic peptide (  BNP) 46.0  0.0 - 100.0 pg/mL  COMPREHENSIVE METABOLIC PANEL       Result Value Range   Sodium 143  135 - 145 mEq/L   Potassium 4.0 DELTA CHECK NOTED  3.5 - 5.1 mEq/L   Chloride 106  96 - 112 mEq/L   CO2 28  19 - 32 mEq/L   Glucose, Bld 93  70 - 99 mg/dL   BUN 8  6 - 23 mg/dL   Creatinine, Ser 0.86  0.4 - 1.2 mg/dL   Calcium 10.0  8.4 - 10.5 mg/dL   Total Protein 6.4  6.0 - 8.3 g/dL   Albumin 3.9  3.5 - 5.2 g/dL   AST 30  0 - 37 U/L   ALT 25  0 - 35 U/L   Alkaline Phosphatase 76  39 - 117 U/L   Total Bilirubin 0.5  0.3 - 1.2 mg/dL   GFR calc non Af Amer >60  >60 mL/min   GFR calc Af Amer    >60 mL/min   Value: >60            The eGFR has been calculated     using the MDRD equation.     This calculation has not been     validated in all clinical     situations.     eGFR's persistently     <60 mL/min signify     possible Chronic Kidney Disease.  BRAIN NATRIURETIC PEPTIDE      Result Value Range   Pro B Natriuretic peptide (BNP) 596.0 (*) 0.0 - 100.0 pg/mL  BASIC METABOLIC PANEL      Result Value Range   Sodium 143  135 - 145 mEq/L   Potassium 3.5  3.5 - 5.1 mEq/L   Chloride 111  96 - 112 mEq/L   CO2 26  19 - 32 mEq/L   Glucose, Bld 87  70 - 99 mg/dL   BUN 10  6 - 23 mg/dL   Creatinine, Ser 0.77  0.4 - 1.2 mg/dL   Calcium 9.9  8.4 - 10.5 mg/dL   GFR calc non Af Amer >60  >60 mL/min   GFR calc Af Amer    >60 mL/min   Value: >60            The eGFR has been calculated     using the MDRD equation.     This calculation has not been     validated in all clinical     situations.     eGFR's persistently     <60 mL/min signify     possible Chronic Kidney Disease.  CBC      Result Value Range   WBC 3.7 (*) 4.0 - 10.5 K/uL   RBC 3.65 (*) 3.87 - 5.11 MIL/uL   Hemoglobin 11.3 (*) 12.0 - 15.0 g/dL   HCT 34.8 (*) 36.0 - 46.0 %   MCV 95.3  78.0 - 100.0 fL   MCH 31.0  26.0 - 34.0 pg   MCHC 32.5  30.0 - 36.0 g/dL   RDW 13.1  11.5 - 15.5 %   Platelets 164  150 - 400 K/uL  DIFFERENTIAL      Result Value Range   Neutrophils Relative % 69  43  - 77 %   Neutro Abs 2.5  1.7 - 7.7 K/uL   Lymphocytes Relative 14  12 - 46 %   Lymphs Abs 0.5 (*) 0.7 - 4.0 K/uL   Monocytes Relative 13 (*)  3 - 12 %   Monocytes Absolute 0.5  0.1 - 1.0 K/uL   Eosinophils Relative 3  0 - 5 %   Eosinophils Absolute 0.1  0.0 - 0.7 K/uL   Basophils Relative 0  0 - 1 %   Basophils Absolute 0.0  0.0 - 0.1 K/uL  URINALYSIS, ROUTINE W REFLEX MICROSCOPIC      Result Value Range   Color, Urine YELLOW  YELLOW   APPearance CLEAR  CLEAR   Specific Gravity, Urine 1.019  1.005 - 1.030   pH 7.0  5.0 - 8.0   Glucose, UA NEGATIVE  NEGATIVE mg/dL   Hgb urine dipstick NEGATIVE  NEGATIVE   Bilirubin Urine NEGATIVE  NEGATIVE   Ketones, ur 15 (*) NEGATIVE mg/dL   Protein, ur 30 (*) NEGATIVE mg/dL   Urobilinogen, UA 0.2  0.0 - 1.0 mg/dL   Nitrite NEGATIVE  NEGATIVE   Leukocytes, UA MODERATE (*) NEGATIVE  URINE MICROSCOPIC-ADD ON      Result Value Range   Squamous Epithelial / LPF RARE  RARE   WBC, UA 7-10  <3 WBC/hpf   RBC / HPF 0-2  <3 RBC/hpf   Bacteria, UA FEW (*) RARE  CK TOTAL AND CKMB      Result Value Range   Total CK 71  7 - 177 U/L   CK, MB 0.9  0.3 - 4.0 ng/mL   Relative Index    0.0 - 2.5   Value: RELATIVE INDEX IS INVALID     WHEN CK < 100 U/L             TROPONIN I      Result Value Range   Troponin I    0.00 - 0.06 ng/mL   Value: 0.01            NO INDICATION OF     MYOCARDIAL INJURY.  TSH      Result Value Range   TSH 0.825  0.350 - 4.500 uIU/mL   Ct Head Wo Contrast  01/28/2013   *RADIOLOGY REPORT*  Clinical Data:  The patient fell and hit her face.  Frontal head and face pain.  Posterior neck pain.  Swollen upper lip.  CT HEAD WITHOUT CONTRAST CT MAXILLOFACIAL WITHOUT CONTRAST CT CERVICAL SPINE WITHOUT CONTRAST  Technique:  Multidetector CT imaging of the head, cervical spine, and maxillofacial structures were performed using the standard protocol without intravenous contrast. Multiplanar CT image reconstructions of the cervical spine and  maxillofacial structures were also generated.  Comparison:   None  CT HEAD  Findings: Normal appearing cerebral hemispheres and posterior fossa structures.  Normal size and position of the ventricles.  No skull fracture, intracranial hemorrhage or paranasal sinus air-fluid levels.  IMPRESSION: No skull fracture or intracranial hemorrhage.  CT MAXILLOFACIAL  Findings:  Defect in the posterior wall of the right maxillary sinus, medially, with adjacent surgical clips.  No facial bone fractures or paranasal sinus air-fluid levels.  IMPRESSION: No fracture.  CT CERVICAL SPINE  Findings:   1.0 cm right lobe thyroid nodule on image number 56.  4 mm left lobe thyroid nodule on image number 56.  7 mm isthmus nodule on image number 66.  Mildly diffusely enlarged thyroid gland.  Minimal levoconvex scoliosis.  Reversal of the normal cervical lordosis.  Facet degenerative changes and anterior spur formation at multiple levels.  No prevertebral soft tissue swelling, fractures or subluxations.  IMPRESSION:  1.  No fracture or subluxation. 2.  Reversal of the normal  cervical lordosis. 3.  Degenerative changes. 4.  Multinodular goiter with sub-centimeter thyroid nodule(s) noted, too small to characterize, but most likely benign in the absence of known clinical risk factors for thyroid carcinoma.   Original Report Authenticated By: Claudie Revering, M.D.   Ct Cervical Spine Wo Contrast  01/28/2013   *RADIOLOGY REPORT*  Clinical Data:  The patient fell and hit her face.  Frontal head and face pain.  Posterior neck pain.  Swollen upper lip.  CT HEAD WITHOUT CONTRAST CT MAXILLOFACIAL WITHOUT CONTRAST CT CERVICAL SPINE WITHOUT CONTRAST  Technique:  Multidetector CT imaging of the head, cervical spine, and maxillofacial structures were performed using the standard protocol without intravenous contrast. Multiplanar CT image reconstructions of the cervical spine and maxillofacial structures were also generated.  Comparison:   None  CT HEAD   Findings: Normal appearing cerebral hemispheres and posterior fossa structures.  Normal size and position of the ventricles.  No skull fracture, intracranial hemorrhage or paranasal sinus air-fluid levels.  IMPRESSION: No skull fracture or intracranial hemorrhage.  CT MAXILLOFACIAL  Findings:  Defect in the posterior wall of the right maxillary sinus, medially, with adjacent surgical clips.  No facial bone fractures or paranasal sinus air-fluid levels.  IMPRESSION: No fracture.  CT CERVICAL SPINE  Findings:   1.0 cm right lobe thyroid nodule on image number 56.  4 mm left lobe thyroid nodule on image number 56.  7 mm isthmus nodule on image number 66.  Mildly diffusely enlarged thyroid gland.  Minimal levoconvex scoliosis.  Reversal of the normal cervical lordosis.  Facet degenerative changes and anterior spur formation at multiple levels.  No prevertebral soft tissue swelling, fractures or subluxations.  IMPRESSION:  1.  No fracture or subluxation. 2.  Reversal of the normal cervical lordosis. 3.  Degenerative changes. 4.  Multinodular goiter with sub-centimeter thyroid nodule(s) noted, too small to characterize, but most likely benign in the absence of known clinical risk factors for thyroid carcinoma.   Original Report Authenticated By: Claudie Revering, M.D.   Ct Maxillofacial Wo Cm  01/28/2013   *RADIOLOGY REPORT*  Clinical Data:  The patient fell and hit her face.  Frontal head and face pain.  Posterior neck pain.  Swollen upper lip.  CT HEAD WITHOUT CONTRAST CT MAXILLOFACIAL WITHOUT CONTRAST CT CERVICAL SPINE WITHOUT CONTRAST  Technique:  Multidetector CT imaging of the head, cervical spine, and maxillofacial structures were performed using the standard protocol without intravenous contrast. Multiplanar CT image reconstructions of the cervical spine and maxillofacial structures were also generated.  Comparison:   None  CT HEAD  Findings: Normal appearing cerebral hemispheres and posterior fossa structures.   Normal size and position of the ventricles.  No skull fracture, intracranial hemorrhage or paranasal sinus air-fluid levels.  IMPRESSION: No skull fracture or intracranial hemorrhage.  CT MAXILLOFACIAL  Findings:  Defect in the posterior wall of the right maxillary sinus, medially, with adjacent surgical clips.  No facial bone fractures or paranasal sinus air-fluid levels.  IMPRESSION: No fracture.  CT CERVICAL SPINE  Findings:   1.0 cm right lobe thyroid nodule on image number 56.  4 mm left lobe thyroid nodule on image number 56.  7 mm isthmus nodule on image number 66.  Mildly diffusely enlarged thyroid gland.  Minimal levoconvex scoliosis.  Reversal of the normal cervical lordosis.  Facet degenerative changes and anterior spur formation at multiple levels.  No prevertebral soft tissue swelling, fractures or subluxations.  IMPRESSION:  1.  No fracture or subluxation. 2.  Reversal of the normal cervical lordosis. 3.  Degenerative changes. 4.  Multinodular goiter with sub-centimeter thyroid nodule(s) noted, too small to characterize, but most likely benign in the absence of known clinical risk factors for thyroid carcinoma.   Original Report Authenticated By: Claudie Revering, M.D.      1. Fall, initial encounter   2. Face pain       MDM  Patient with fall.  Face pain and headache.  Will order CT head, face, and neck.  Discussed with Dr. Betsey Holiday, who agrees with the plan.    No acute fractures. Will discharge to home with some pain medicine.  Patient is stable and understands and agrees with the plan.        Montine Circle, PA-C 01/31/13 1106

## 2013-02-01 NOTE — ED Provider Notes (Signed)
Medical screening examination/treatment/procedure(s) were performed by non-physician practitioner and as supervising physician I was immediately available for consultation/collaboration.  Orpah Greek, MD 02/01/13 1758

## 2013-05-25 ENCOUNTER — Other Ambulatory Visit: Payer: Self-pay

## 2013-05-25 DIAGNOSIS — Z1231 Encounter for screening mammogram for malignant neoplasm of breast: Secondary | ICD-10-CM

## 2013-06-19 ENCOUNTER — Ambulatory Visit
Admission: RE | Admit: 2013-06-19 | Discharge: 2013-06-19 | Disposition: A | Discharge status: Home / Self Care | Payer: Medicare Other | Source: Ambulatory Visit

## 2013-06-19 DIAGNOSIS — Z1231 Encounter for screening mammogram for malignant neoplasm of breast: Secondary | ICD-10-CM

## 2013-09-15 ENCOUNTER — Emergency Department (HOSPITAL_COMMUNITY)
Admission: EM | Admit: 2013-09-15 | Discharge: 2013-09-15 | Disposition: A | Discharge status: Home / Self Care | Payer: Medicare PPO | Attending: Emergency Medicine | Admitting: Emergency Medicine

## 2013-09-15 ENCOUNTER — Encounter (HOSPITAL_COMMUNITY): Payer: Self-pay | Admitting: Emergency Medicine

## 2013-09-15 DIAGNOSIS — M5430 Sciatica, unspecified side: Secondary | ICD-10-CM | POA: Insufficient documentation

## 2013-09-15 DIAGNOSIS — Z7982 Long term (current) use of aspirin: Secondary | ICD-10-CM | POA: Insufficient documentation

## 2013-09-15 DIAGNOSIS — E785 Hyperlipidemia, unspecified: Secondary | ICD-10-CM | POA: Insufficient documentation

## 2013-09-15 DIAGNOSIS — M549 Dorsalgia, unspecified: Secondary | ICD-10-CM | POA: Insufficient documentation

## 2013-09-15 DIAGNOSIS — N186 End stage renal disease: Secondary | ICD-10-CM | POA: Insufficient documentation

## 2013-09-15 DIAGNOSIS — M79609 Pain in unspecified limb: Secondary | ICD-10-CM

## 2013-09-15 DIAGNOSIS — D649 Anemia, unspecified: Secondary | ICD-10-CM | POA: Insufficient documentation

## 2013-09-15 DIAGNOSIS — E669 Obesity, unspecified: Secondary | ICD-10-CM | POA: Insufficient documentation

## 2013-09-15 DIAGNOSIS — I120 Hypertensive chronic kidney disease with stage 5 chronic kidney disease or end stage renal disease: Secondary | ICD-10-CM | POA: Insufficient documentation

## 2013-09-15 DIAGNOSIS — Z79899 Other long term (current) drug therapy: Secondary | ICD-10-CM | POA: Insufficient documentation

## 2013-09-15 MED ORDER — OXYCODONE-ACETAMINOPHEN 5-325 MG PO TABS: 1.0000 | ORAL_TABLET | Freq: Four times a day (QID) | ORAL | Status: DC | PRN

## 2013-09-15 MED ORDER — OXYCODONE-ACETAMINOPHEN 5-325 MG PO TABS
1.0000 | ORAL_TABLET | Freq: Once | ORAL | Status: AC
  Administered 2013-09-15: 1 via ORAL
  Filled 2013-09-15: qty 1

## 2013-09-15 NOTE — ED Notes (Signed)
Pt reports right leg and hip pain that started on Sunday. Reports the pain has gotten worse over the past couple of days. States that she has increased pain with movement.

## 2013-09-15 NOTE — ED Provider Notes (Signed)
CSN: [id number]     Arrival date & time 09/15/13  1608 History   First MD Initiated Contact with Patient 09/15/13 1622     Chief Complaint  Patient presents with  . Leg Pain   (Consider location/radiation/quality/duration/timing/severity/associated sxs/prior Treatment) Patient is a 68 y.o. female presenting with leg pain. The history is provided by the patient.  Leg Pain Associated symptoms: back pain    patient has had right lower extremity pain for the last several days. She states it feels like it is in the catheter worked his way up to her hip. Is worse with moving. Is worse with certain positions. She's had some swelling in her right lower leg, but that is not unusual for her. She states her right leg is always more swollen than left. No trouble breathing. No rash. No trauma.  Past Medical History  Diagnosis Date  . Hypertension   . Hyperlipemia   . ESRD (end stage renal disease)   . Obesity   . Thyroid disease   . Anemia    Past Surgical History  Procedure Laterality Date  . Cholecystectomy    . Appendectomy    . Abdominal hysterectomy    . Tonsillectomy    . Kidney transplant      june 2011-[facility]   History reviewed. No pertinent family history. History  Substance Use Topics  . Smoking status: Never Smoker   . Smokeless tobacco: Not on file  . Alcohol Use: No   OB History   Grav Para Term Preterm Abortions TAB SAB Ect Mult Living                 Review of Systems  Constitutional: Negative for activity change and appetite change.  Respiratory: Negative for chest tightness and shortness of breath.   Cardiovascular: Positive for leg swelling. Negative for chest pain.  Gastrointestinal: Negative for nausea, vomiting, abdominal pain and diarrhea.  Genitourinary: Negative for flank pain.  Musculoskeletal: Positive for back pain. Negative for neck stiffness.  Neurological: Negative for weakness and numbness.    Allergies  Iohexol and Vancomycin  Home  Medications   Current Outpatient Rx  Name  Route  Sig  Dispense  Refill  . acetaminophen (TYLENOL) 500 MG tablet   Oral   Take 500 mg by mouth every 6 (six) hours as needed.         Marland Kitchen amLODipine (NORVASC) 5 MG tablet   Oral   Take 5 mg by mouth daily.         Marland Kitchen aspirin EC 81 MG tablet   Oral   Take 81 mg by mouth daily.         Marland Kitchen b complex-vitamin c-folic acid (NEPHRO-VITE) 0.8 MG TABS   Oral   Take 0.8 mg by mouth at bedtime.         . furosemide (LASIX) 40 MG tablet   Oral   Take 40 mg by mouth 2 (two) times daily as needed (fluid).         . metoprolol (LOPRESSOR) 50 MG tablet   Oral   Take 50 mg by mouth 2 (two) times daily.         . mycophenolate (CELLCEPT) 250 MG capsule   Oral   Take 500 mg by mouth 2 (two) times daily.         . pravastatin (PRAVACHOL) 20 MG tablet   Oral   Take 20 mg by mouth at bedtime.         Marland Kitchen  ranitidine (ZANTAC) 150 MG tablet   Oral   Take 150 mg by mouth daily.         . tacrolimus (PROGRAF) 1 MG capsule   Oral   Take 3 mg by mouth 2 (two) times daily.         Marland Kitchen oxyCODONE-acetaminophen (PERCOCET/ROXICET) 5-325 MG per tablet   Oral   Take 1-2 tablets by mouth every 6 (six) hours as needed for severe pain.   15 tablet   0    BP 158/68  Pulse 65  Temp(Src) 98.6 F (37 C) (Oral)  Resp 18  Wt 239 lb 4 oz (108.523 kg)  SpO2 98% Physical Exam  Nursing note and vitals reviewed. Constitutional: She is oriented to person, place, and time. She appears well-developed and well-nourished.  HENT:  Head: Normocephalic and atraumatic.  Eyes: EOM are normal. Pupils are equal, round, and reactive to light.  Neck: Normal range of motion. Neck supple.  Cardiovascular: Normal rate, regular rhythm and normal heart sounds.   No murmur heard. Pulmonary/Chest: Effort normal and breath sounds normal. No respiratory distress. She has no wheezes. She has no rales.  Abdominal: Soft. Bowel sounds are normal. She exhibits no  distension. There is no tenderness. There is no rebound and no guarding.  Musculoskeletal: Normal range of motion. She exhibits edema.  Mild right lower extremity pitting edema. Palpable pulse in bilateral dorsalis pedis. Some tenderness in as high area on right side. Minimal pain with right-sided straight leg raise.  Neurological: She is alert and oriented to person, place, and time. No cranial nerve deficit.  Skin: Skin is warm and dry.  Psychiatric: She has a normal mood and affect. Her speech is normal.    ED Course  Procedures (including critical care time) Labs Review Labs Reviewed - No data to display Imaging Review No results found.  EKG Interpretation   None       MDM   1. Sciatica    Patient back pain radiating down leg. Negative Doppler. Likely sciatica. Will discharge home with pain medicine    Jasper Riling. Alvino Chapel, MD 09/15/13 [phone]

## 2013-09-15 NOTE — Discharge Instructions (Signed)
Sciatica °Sciatica is pain, weakness, numbness, or tingling along the path of the sciatic nerve. The nerve starts in the lower back and runs down the back of each leg. The nerve controls the muscles in the lower leg and in the back of the knee, while also providing sensation to the back of the thigh, lower leg, and the sole of your foot. Sciatica is a symptom of another medical condition. For instance, nerve damage or certain conditions, such as a herniated disk or bone spur on the spine, pinch or put pressure on the sciatic nerve. This causes the pain, weakness, or other sensations normally associated with sciatica. Generally, sciatica only affects one side of the body. °CAUSES  °· Herniated or slipped disc. °· Degenerative disk disease. °· A pain disorder involving the narrow muscle in the buttocks (piriformis syndrome). °· Pelvic injury or fracture. °· Pregnancy. °· Tumor (rare). °SYMPTOMS  °Symptoms can vary from mild to very severe. The symptoms usually travel from the low back to the buttocks and down the back of the leg. Symptoms can include: °· Mild tingling or dull aches in the lower back, leg, or hip. °· Numbness in the back of the calf or sole of the foot. °· Burning sensations in the lower back, leg, or hip. °· Sharp pains in the lower back, leg, or hip. °· Leg weakness. °· Severe back pain inhibiting movement. °These symptoms may get worse with coughing, sneezing, laughing, or prolonged sitting or standing. Also, being overweight may worsen symptoms. °DIAGNOSIS  °Your caregiver will perform a physical exam to look for common symptoms of sciatica. He or she may ask you to do certain movements or activities that would trigger sciatic nerve pain. Other tests may be performed to find the cause of the sciatica. These may include: °· Blood tests. °· X-rays. °· Imaging tests, such as an MRI or CT scan. °TREATMENT  °Treatment is directed at the cause of the sciatic pain. Sometimes, treatment is not necessary  and the pain and discomfort goes away on its own. If treatment is needed, your caregiver may suggest: °· Over-the-counter medicines to relieve pain. °· Prescription medicines, such as anti-inflammatory medicine, muscle relaxants, or narcotics. °· Applying heat or ice to the painful area. °· Steroid injections to lessen pain, irritation, and inflammation around the nerve. °· Reducing activity during periods of pain. °· Exercising and stretching to strengthen your abdomen and improve flexibility of your spine. Your caregiver may suggest losing weight if the extra weight makes the back pain worse. °· Physical therapy. °· Surgery to eliminate what is pressing or pinching the nerve, such as a bone spur or part of a herniated disk. °HOME CARE INSTRUCTIONS  °· Only take over-the-counter or prescription medicines for pain or discomfort as directed by your caregiver. °· Apply ice to the affected area for 20 minutes, 3 4 times a day for the first 48 72 hours. Then try heat in the same way. °· Exercise, stretch, or perform your usual activities if these do not aggravate your pain. °· Attend physical therapy sessions as directed by your caregiver. °· Keep all follow-up appointments as directed by your caregiver. °· Do not wear high heels or shoes that do not provide proper support. °· Check your mattress to see if it is too soft. A firm mattress may lessen your pain and discomfort. °SEEK IMMEDIATE MEDICAL CARE IF:  °· You lose control of your bowel or bladder (incontinence). °· You have increasing weakness in the lower back,   pelvis, buttocks, or legs. °· You have redness or swelling of your back. °· You have a burning sensation when you urinate. °· You have pain that gets worse when you lie down or awakens you at night. °· Your pain is worse than you have experienced in the past. °· Your pain is lasting longer than 4 weeks. °· You are suddenly losing weight without reason. °MAKE SURE YOU: °· Understand these  instructions. °· Will watch your condition. °· Will get help right away if you are not doing well or get worse. °Document Released: 07/28/2001 Document Revised: 02/02/2012 Document Reviewed: 12/13/2011 °[employer]® Patient Information ©2014 [employer]. ° °

## 2013-09-15 NOTE — ED Notes (Signed)
Pt. Reports intermittent right leg pain after waking up on Sunday morning. States has constant "soreness/stiffness" to leg with intermittent sharp pain at hip and calf". Pt. Denies numbness/tingling in leg, pedal pulse intact. Denies injury. No deformity or swelling noted to leg.

## 2013-09-15 NOTE — Progress Notes (Signed)
*  PRELIMINARY RESULTS* Vascular Ultrasound right lower extremity venous duplex has been completed.  Preliminary findings: no evidence of DVT  Landry Mellow, RDMS, RVT  09/15/2013, 5:52 PM

## 2013-09-19 ENCOUNTER — Emergency Department (HOSPITAL_COMMUNITY)
Admission: EM | Admit: 2013-09-19 | Discharge: 2013-09-19 | Disposition: A | Discharge status: Home / Self Care | Payer: Medicare PPO | Attending: Emergency Medicine | Admitting: Emergency Medicine

## 2013-09-19 ENCOUNTER — Encounter (HOSPITAL_COMMUNITY): Payer: Self-pay | Admitting: Emergency Medicine

## 2013-09-19 DIAGNOSIS — R21 Rash and other nonspecific skin eruption: Secondary | ICD-10-CM | POA: Insufficient documentation

## 2013-09-19 DIAGNOSIS — R609 Edema, unspecified: Secondary | ICD-10-CM | POA: Insufficient documentation

## 2013-09-19 DIAGNOSIS — T4995XA Adverse effect of unspecified topical agent, initial encounter: Secondary | ICD-10-CM | POA: Insufficient documentation

## 2013-09-19 DIAGNOSIS — Z992 Dependence on renal dialysis: Secondary | ICD-10-CM | POA: Insufficient documentation

## 2013-09-19 DIAGNOSIS — Z862 Personal history of diseases of the blood and blood-forming organs and certain disorders involving the immune mechanism: Secondary | ICD-10-CM | POA: Insufficient documentation

## 2013-09-19 DIAGNOSIS — Z79899 Other long term (current) drug therapy: Secondary | ICD-10-CM | POA: Insufficient documentation

## 2013-09-19 DIAGNOSIS — M7989 Other specified soft tissue disorders: Secondary | ICD-10-CM | POA: Insufficient documentation

## 2013-09-19 DIAGNOSIS — I120 Hypertensive chronic kidney disease with stage 5 chronic kidney disease or end stage renal disease: Secondary | ICD-10-CM | POA: Insufficient documentation

## 2013-09-19 DIAGNOSIS — Z789 Other specified health status: Secondary | ICD-10-CM

## 2013-09-19 DIAGNOSIS — N186 End stage renal disease: Secondary | ICD-10-CM | POA: Insufficient documentation

## 2013-09-19 DIAGNOSIS — T887XXA Unspecified adverse effect of drug or medicament, initial encounter: Secondary | ICD-10-CM | POA: Insufficient documentation

## 2013-09-19 DIAGNOSIS — E785 Hyperlipidemia, unspecified: Secondary | ICD-10-CM | POA: Insufficient documentation

## 2013-09-19 DIAGNOSIS — Z7982 Long term (current) use of aspirin: Secondary | ICD-10-CM | POA: Insufficient documentation

## 2013-09-19 DIAGNOSIS — M549 Dorsalgia, unspecified: Secondary | ICD-10-CM | POA: Insufficient documentation

## 2013-09-19 MED ORDER — IBUPROFEN 800 MG PO TABS: 800.0000 mg | ORAL_TABLET | Freq: Three times a day (TID) | ORAL | Status: DC

## 2013-09-19 MED ORDER — DIAZEPAM 2 MG PO TABS
2.0000 mg | ORAL_TABLET | Freq: Once | ORAL | Status: AC
  Administered 2013-09-19: 2 mg via ORAL
  Filled 2013-09-19: qty 1

## 2013-09-19 MED ORDER — DIAZEPAM 2 MG PO TABS: 2.0000 mg | ORAL_TABLET | Freq: Four times a day (QID) | ORAL | Status: DC | PRN

## 2013-09-19 MED ORDER — ACETAMINOPHEN 500 MG PO TABS: 500.0000 mg | ORAL_TABLET | Freq: Four times a day (QID) | ORAL | Status: DC | PRN

## 2013-09-19 NOTE — ED Provider Notes (Signed)
CSN: [id number]     Arrival date & time 09/19/13  1030 History   First MD Initiated Contact with Patient 09/19/13 1041    This chart was scribed for Baron Sane PA-C, a non-physician practitioner working with No att. providers found by Denice Bors, ED Scribe. This patient was seen in room TR09C/TR09C and the patient's care was started at 11:35 AM       Chief Complaint  Patient presents with  . Rash  . Back Pain   (Consider location/radiation/quality/duration/timing/severity/associated sxs/prior Treatment) The history is provided by the patient. No language interpreter was used.   HPI Comments: Sarah Gilbert is a 68 y.o. female who presents to the Emergency Department with complaining of constant low back pain onset 2 weeks. Reports associated unchanged chronic unilateral swelling of right lower extremity. Reports trying percocet with relief of symptoms for 30 minutes before pain returned. Reports back pain is exacerbated by all positions. Denies associated recent trauma, and fall. Denies urinary or fecal incontinence, urinary retention, perineal/saddle paresthesias, fever, PMHx of cancer, and IV drug use.  Negative Doppler study of right lower extremity 09/15/2012   Pt last seen in ED 09/15/13 with dx of sciatica.  Additionally, reports constant mild rash on left leg onset 2 days. Describes rash as painful with gradual onset. Denies shortness of breath, and sore throat.  Past Medical History  Diagnosis Date  . Hypertension   . Hyperlipemia   . ESRD (end stage renal disease)   . Obesity   . Thyroid disease   . Anemia    Past Surgical History  Procedure Laterality Date  . Cholecystectomy    . Appendectomy    . Abdominal hysterectomy    . Tonsillectomy    . Kidney transplant      june 2011-[facility]   No family history on file. History  Substance Use Topics  . Smoking status: Never Smoker   . Smokeless tobacco: Not on file  . Alcohol Use: No   OB History   Grav Para Term Preterm Abortions TAB SAB Ect Mult Living                 Review of Systems  Constitutional: Negative for fever.  Musculoskeletal: Positive for back pain.  Psychiatric/Behavioral: Negative for confusion.    Allergies  Iohexol and Vancomycin  Home Medications   Current Outpatient Rx  Name  Route  Sig  Dispense  Refill  . acetaminophen (TYLENOL) 500 MG tablet   Oral   Take 500 mg by mouth every 6 (six) hours as needed.         Marland Kitchen acetaminophen (TYLENOL) 500 MG tablet   Oral   Take 1 tablet (500 mg total) by mouth every 6 (six) hours as needed.   30 tablet   0   . amLODipine (NORVASC) 5 MG tablet   Oral   Take 5 mg by mouth daily.         Marland Kitchen aspirin EC 81 MG tablet   Oral   Take 81 mg by mouth daily.         Marland Kitchen b complex-vitamin c-folic acid (NEPHRO-VITE) 0.8 MG TABS   Oral   Take 0.8 mg by mouth at bedtime.         . diazepam (VALIUM) 2 MG tablet   Oral   Take 1 tablet (2 mg total) by mouth every 6 (six) hours as needed for muscle spasms.   15 tablet   0   . furosemide (LASIX)  40 MG tablet   Oral   Take 40 mg by mouth 2 (two) times daily as needed (fluid).         . metoprolol (LOPRESSOR) 50 MG tablet   Oral   Take 50 mg by mouth 2 (two) times daily.         . mycophenolate (CELLCEPT) 250 MG capsule   Oral   Take 500 mg by mouth 2 (two) times daily.         . pravastatin (PRAVACHOL) 20 MG tablet   Oral   Take 20 mg by mouth at bedtime.         . ranitidine (ZANTAC) 150 MG tablet   Oral   Take 150 mg by mouth daily.         . tacrolimus (PROGRAF) 1 MG capsule   Oral   Take 3 mg by mouth 2 (two) times daily.          BP 148/80  Pulse 89  Temp(Src) 98.7 F (37.1 C) (Oral)  Resp 18  SpO2 100% Physical Exam  Constitutional: She is oriented to person, place, and time. She appears well-developed and well-nourished. No distress.  Pt is morbidly obese  HENT:  Head: Normocephalic and atraumatic.  Right Ear: External  ear normal.  Left Ear: External ear normal.  Nose: Nose normal.  Mouth/Throat: Oropharynx is clear and moist.  Eyes: Conjunctivae are normal.  Neck: Normal range of motion. Neck supple.  Cardiovascular: Normal rate.   Pulmonary/Chest: Effort normal.  Abdominal: Soft.  Musculoskeletal: Normal range of motion. She exhibits edema.       Back:  No midline C-spine, T-spine, or L-spine tenderness with no step-offs or deformities noted   Unilateral swelling of right lower extremity with pitting edema.    Neurological: She is alert and oriented to person, place, and time.  Skin: Skin is warm and dry. She is not diaphoretic.  Scattered few red papules.   Psychiatric: She has a normal mood and affect.    ED Course  Procedures  COORDINATION OF CARE:  Nursing notes reviewed. Vital signs reviewed. Initial pt interview and examination performed.   11:37 AM-Discussed treatment plan with pt at bedside. Pt agrees with plan.   Treatment plan initiated: Medications  diazepam (VALIUM) tablet 2 mg (2 mg Oral Given 09/19/13 1150)     Initial diagnostic testing ordered.     Labs Review Labs Reviewed - No data to display Imaging Review No results found.  EKG Interpretation   None       MDM   1. Back pain   2. Medication intolerance     Filed Vitals:   09/19/13 1217  BP: 148/80  Pulse: 89  Temp: 98.7 F (37.1 C)  Resp: 18   Afebrile, NAD, non-toxic appearing, AAOx4. Patient with back pain.  No neurological deficits and normal neuro exam.  Patient can walk but states is painful.  No loss of bowel or bladder control.  No concern for cauda equina.  No fever, night sweats, weight loss, h/o cancer, IVDU.  RICE protocol and pain medicine indicated and discussed with patient. Will d/c percocet d/t rash, no evidence of moderate to sever allergic rxn no respiratory or oropharyngeal involvement. Return precautions discussed. Patient is agreeable to plan. Patient is stable at time of  discharge      I personally performed the services described in this documentation, which was scribed in my presence. The recorded information has been reviewed and is accurate.  Harlow Mares, PA-C 09/19/13 1636

## 2013-09-19 NOTE — ED Notes (Signed)
Pt reports seen Friday for same, diagnosed with sciatica. States medication not working and now having rash to left leg.

## 2013-09-19 NOTE — Discharge Instructions (Signed)
Please follow up with your primary care physician in 1-2 days. If you do not have one please call the [facility] number listed above. Please follow up with your orthopedist, Dr. Noemi Chapel, to schedule a follow up appointment. Please discontinue Percocet. Please take Valium as prescribed. Please do not drive on this medication. Please read all discharge instructions and return precautions.   Sciatica Sciatica is pain, weakness, numbness, or tingling along the path of the sciatic nerve. The nerve starts in the lower back and runs down the back of each leg. The nerve controls the muscles in the lower leg and in the back of the knee, while also providing sensation to the back of the thigh, lower leg, and the sole of your foot. Sciatica is a symptom of another medical condition. For instance, nerve damage or certain conditions, such as a herniated disk or bone spur on the spine, pinch or put pressure on the sciatic nerve. This causes the pain, weakness, or other sensations normally associated with sciatica. Generally, sciatica only affects one side of the body. CAUSES   Herniated or slipped disc.  Degenerative disk disease.  A pain disorder involving the narrow muscle in the buttocks (piriformis syndrome).  Pelvic injury or fracture.  Pregnancy.  Tumor (rare). SYMPTOMS  Symptoms can vary from mild to very severe. The symptoms usually travel from the low back to the buttocks and down the back of the leg. Symptoms can include:  Mild tingling or dull aches in the lower back, leg, or hip.  Numbness in the back of the calf or sole of the foot.  Burning sensations in the lower back, leg, or hip.  Sharp pains in the lower back, leg, or hip.  Leg weakness.  Severe back pain inhibiting movement. These symptoms may get worse with coughing, sneezing, laughing, or prolonged sitting or standing. Also, being overweight may worsen symptoms. DIAGNOSIS  Your caregiver will perform a  physical exam to look for common symptoms of sciatica. He or she may ask you to do certain movements or activities that would trigger sciatic nerve pain. Other tests may be performed to find the cause of the sciatica. These may include:  Blood tests.  X-rays.  Imaging tests, such as an MRI or CT scan. TREATMENT  Treatment is directed at the cause of the sciatic pain. Sometimes, treatment is not necessary and the pain and discomfort goes away on its own. If treatment is needed, your caregiver may suggest:  Over-the-counter medicines to relieve pain.  Prescription medicines, such as anti-inflammatory medicine, muscle relaxants, or narcotics.  Applying heat or ice to the painful area.  Steroid injections to lessen pain, irritation, and inflammation around the nerve.  Reducing activity during periods of pain.  Exercising and stretching to strengthen your abdomen and improve flexibility of your spine. Your caregiver may suggest losing weight if the extra weight makes the back pain worse.  Physical therapy.  Surgery to eliminate what is pressing or pinching the nerve, such as a bone spur or part of a herniated disk. HOME CARE INSTRUCTIONS   Only take over-the-counter or prescription medicines for pain or discomfort as directed by your caregiver.  Apply ice to the affected area for 20 minutes, 3 4 times a day for the first 48 72 hours. Then try heat in the same way.  Exercise, stretch, or perform your usual activities if these do not aggravate your pain.  Attend physical therapy sessions as directed by your caregiver.  Keep  all follow-up appointments as directed by your caregiver.  Do not wear high heels or shoes that do not provide proper support.  Check your mattress to see if it is too soft. A firm mattress may lessen your pain and discomfort. SEEK IMMEDIATE MEDICAL CARE IF:   You lose control of your bowel or bladder (incontinence).  You have increasing weakness in the lower  back, pelvis, buttocks, or legs.  You have redness or swelling of your back.  You have a burning sensation when you urinate.  You have pain that gets worse when you lie down or awakens you at night.  Your pain is worse than you have experienced in the past.  Your pain is lasting longer than 4 weeks.  You are suddenly losing weight without reason. MAKE SURE YOU:  Understand these instructions.  Will watch your condition.  Will get help right away if you are not doing well or get worse. Document Released: 07/28/2001 Document Revised: 02/02/2012 Document Reviewed: 12/13/2011 [facility] Patient Information 2014 [place].

## 2013-09-20 NOTE — ED Provider Notes (Signed)
Medical screening examination/treatment/procedure(s) were performed by non-physician practitioner and as supervising physician I was immediately available for consultation/collaboration.  EKG Interpretation   None         Kaitland Lewellyn B. Karle Starch, MD 09/20/13 1610

## 2013-10-31 ENCOUNTER — Ambulatory Visit
Admission: RE | Admit: 2013-10-31 | Discharge: 2013-10-31 | Disposition: A | Discharge status: Home / Self Care | Payer: Medicare PPO | Source: Ambulatory Visit | Attending: Internal Medicine | Admitting: Internal Medicine

## 2013-10-31 ENCOUNTER — Other Ambulatory Visit: Payer: Self-pay | Admitting: Internal Medicine

## 2013-10-31 DIAGNOSIS — M79606 Pain in leg, unspecified: Secondary | ICD-10-CM

## 2013-11-03 ENCOUNTER — Ambulatory Visit
Admission: RE | Admit: 2013-11-03 | Discharge: 2013-11-03 | Disposition: A | Discharge status: Home / Self Care | Payer: Medicare PPO | Source: Ambulatory Visit | Attending: Internal Medicine | Admitting: Internal Medicine

## 2013-11-03 DIAGNOSIS — M79606 Pain in leg, unspecified: Secondary | ICD-10-CM

## 2014-01-29 ENCOUNTER — Other Ambulatory Visit: Payer: Self-pay | Admitting: Orthopedic Surgery

## 2014-01-29 DIAGNOSIS — M25561 Pain in right knee: Secondary | ICD-10-CM

## 2014-02-04 ENCOUNTER — Other Ambulatory Visit: Payer: Medicare PPO

## 2014-02-06 DIAGNOSIS — B0223 Postherpetic polyneuropathy: Secondary | ICD-10-CM | POA: Insufficient documentation

## 2014-02-09 ENCOUNTER — Ambulatory Visit
Admission: RE | Admit: 2014-02-09 | Discharge: 2014-02-09 | Disposition: A | Discharge status: Home / Self Care | Payer: Medicare PPO | Source: Ambulatory Visit | Attending: Orthopedic Surgery | Admitting: Orthopedic Surgery

## 2014-02-09 DIAGNOSIS — M25561 Pain in right knee: Secondary | ICD-10-CM

## 2014-12-18 DIAGNOSIS — N390 Urinary tract infection, site not specified: Secondary | ICD-10-CM | POA: Diagnosis not present

## 2015-02-05 DIAGNOSIS — M19011 Primary osteoarthritis, right shoulder: Secondary | ICD-10-CM | POA: Diagnosis not present

## 2015-02-05 DIAGNOSIS — M2141 Flat foot [pes planus] (acquired), right foot: Secondary | ICD-10-CM | POA: Diagnosis not present

## 2015-02-05 DIAGNOSIS — M1711 Unilateral primary osteoarthritis, right knee: Secondary | ICD-10-CM | POA: Diagnosis not present

## 2015-02-21 DIAGNOSIS — Z940 Kidney transplant status: Secondary | ICD-10-CM | POA: Diagnosis not present

## 2015-02-21 DIAGNOSIS — N390 Urinary tract infection, site not specified: Secondary | ICD-10-CM | POA: Diagnosis not present

## 2015-02-25 ENCOUNTER — Encounter: Payer: Self-pay | Admitting: Sports Medicine

## 2015-02-25 ENCOUNTER — Ambulatory Visit (INDEPENDENT_AMBULATORY_CARE_PROVIDER_SITE_OTHER): Payer: Medicare PPO | Admitting: Sports Medicine

## 2015-02-25 VITALS — BP 186/87 | HR 66 | Ht 65.0 in | Wt 250.0 lb

## 2015-02-25 DIAGNOSIS — M2141 Flat foot [pes planus] (acquired), right foot: Secondary | ICD-10-CM

## 2015-02-25 DIAGNOSIS — M2142 Flat foot [pes planus] (acquired), left foot: Secondary | ICD-10-CM | POA: Diagnosis not present

## 2015-02-25 NOTE — Progress Notes (Signed)
   Subjective:    Patient ID: Sarah Gilbert, female    DOB: Apr 24, 1946, 69 y.o.   MRN: [id number]  HPI chief complaint: Orthotics  Very pleasant 69 year old female comes in today at the request of Dr. Noemi Chapel for orthotics. She has a history of end-stage right knee DJD and pes planus deformity. She is planning on having a total knee arthroplasty sometime in the near future. She is also complaining of some pain along the plantar aspect of her right foot primarily along the plantar aspect of the first metatarsal head. No numbness or tingling.  Medical history reviewed Medications reviewed Allergies reviewed    Review of Systems     Objective:   Physical Exam Overweight. No acute distress  Patient demonstrates pes planus with standing and pronation with walking. She has some tenderness to palpation underneath the first metatarsal head on the right. No soft tissue swelling. No pain with metatarsal squeeze. Neurovascular intact distally. Walking with a limp.       Assessment & Plan:  Pes planus End-stage right knee DJD  Patient was fitted with custom orthotics today. She found them to be comfortable prior to leaving the office. A total of 40 minutes was spent with the patient with greater than 50% of the time spent in face-to-face consultation discussing orthotic construction, instruction, and fitting. Patient will return to the office in 4 weeks. If she is still experiencing pain along the first metatarsal region I may consider additional padding in this area. Call with questions or concerns in the interim.  Patient was fitted for a : standard, cushioned, semi-rigid orthotic. The orthotic was heated and afterward the patient stood on the orthotic blank positioned on the orthotic stand. The patient was positioned in subtalar neutral position and 10 degrees of ankle dorsiflexion in a weight bearing stance. After completion of molding, a stable base was applied to the orthotic  blank. The blank was ground to a stable position for weight bearing. Size:8 Base: blue EVA Posting: none Additional orthotic padding: none

## 2015-02-25 NOTE — Addendum Note (Signed)
Addended by: Lilia Argue R on: 02/25/2015 12:17 PM   Modules accepted: Level of Service

## 2015-02-26 DIAGNOSIS — I129 Hypertensive chronic kidney disease with stage 1 through stage 4 chronic kidney disease, or unspecified chronic kidney disease: Secondary | ICD-10-CM | POA: Diagnosis not present

## 2015-02-26 DIAGNOSIS — R809 Proteinuria, unspecified: Secondary | ICD-10-CM | POA: Diagnosis not present

## 2015-02-26 DIAGNOSIS — D631 Anemia in chronic kidney disease: Secondary | ICD-10-CM | POA: Diagnosis not present

## 2015-02-26 DIAGNOSIS — N189 Chronic kidney disease, unspecified: Secondary | ICD-10-CM | POA: Diagnosis not present

## 2015-03-06 ENCOUNTER — Other Ambulatory Visit: Payer: Self-pay | Admitting: Orthopedic Surgery

## 2015-03-06 DIAGNOSIS — M25511 Pain in right shoulder: Secondary | ICD-10-CM | POA: Diagnosis not present

## 2015-03-11 ENCOUNTER — Ambulatory Visit
Admission: RE | Admit: 2015-03-11 | Discharge: 2015-03-11 | Disposition: A | Discharge status: Home / Self Care | Payer: Medicare PPO | Source: Ambulatory Visit | Attending: Orthopedic Surgery | Admitting: Orthopedic Surgery

## 2015-03-11 DIAGNOSIS — M75101 Unspecified rotator cuff tear or rupture of right shoulder, not specified as traumatic: Secondary | ICD-10-CM | POA: Diagnosis not present

## 2015-03-11 DIAGNOSIS — M19011 Primary osteoarthritis, right shoulder: Secondary | ICD-10-CM | POA: Diagnosis not present

## 2015-03-11 DIAGNOSIS — M25511 Pain in right shoulder: Secondary | ICD-10-CM

## 2015-03-22 DIAGNOSIS — R9431 Abnormal electrocardiogram [ECG] [EKG]: Secondary | ICD-10-CM | POA: Diagnosis not present

## 2015-03-22 DIAGNOSIS — Z01818 Encounter for other preprocedural examination: Secondary | ICD-10-CM | POA: Diagnosis not present

## 2015-03-22 DIAGNOSIS — R946 Abnormal results of thyroid function studies: Secondary | ICD-10-CM | POA: Diagnosis not present

## 2015-03-22 DIAGNOSIS — R21 Rash and other nonspecific skin eruption: Secondary | ICD-10-CM | POA: Diagnosis not present

## 2015-03-25 ENCOUNTER — Ambulatory Visit (INDEPENDENT_AMBULATORY_CARE_PROVIDER_SITE_OTHER): Payer: Medicare PPO | Admitting: Sports Medicine

## 2015-03-25 ENCOUNTER — Encounter: Payer: Self-pay | Admitting: Sports Medicine

## 2015-03-25 VITALS — BP 153/74 | HR 64 | Ht 65.0 in | Wt 250.0 lb

## 2015-03-25 DIAGNOSIS — M79671 Pain in right foot: Secondary | ICD-10-CM | POA: Diagnosis not present

## 2015-03-25 NOTE — Assessment & Plan Note (Signed)
Pain most likely subsequent of her pain pes planus and end-stage DJD of her right knee  She has been placed in custom orthotics and noticed significant improvement  Discussed arch support but reports that her pain feels like it is squeezing her foot so this was deferred - Placed a first ray post in her right foot to help alleviate some of the pain - Follow-up as needed

## 2015-03-25 NOTE — Progress Notes (Signed)
  Sarah Gilbert - 69 y.o. female MRN [id number]  Date of birth: February 13, 1946   Sarah Gilbert is a 69 y.o. female who presents today for on her custom orthotics.   She has a history of pes planus and was placed in orthotics on 02/25/2015. She has some right foot pain located at the navicular that radiates down the first metatarsal. This has improved about 90% since placing the custom orthotic. She still has pain with a step-off in when there is swelling in her foot. She has a history of end-stage right knee DJD and has total knee arthroscopically planned with Dr. Noemi Chapel in the future. She denies any numbness, tingling, or recent injury.  PMHx - reviewed.  Contributory factors include: end-stage renal disease, thyroid disease  Medications -  Tylenol, Lasix   ROS Per HPI   Exam:  Filed Vitals:   03/25/15 0834  BP: 153/74  Pulse: 64   Gen: NAD Cardiorespiratory - Normal respiratory effort/rate.   Foot Exam:  Laterality: right Appearance: No erythema or ecchymosis  Gait: Normal Edema: none   Tenderness: pain located in the midfoot upon palpation of the navicular bone and with palpation of the first metatarsal  Range of Motion: Normal active and passive  Laxity: none Neurovascularly intact: yes  Arch: pes planus   Strength:  Dorsiflexion: 5/5 Plantarflexion: 5/5 Inversion: 5/5 Eversion: 5/5     Imaging:  Reports that imaging done at [facility] of her foot and told she had arthritis.

## 2015-03-28 DIAGNOSIS — Z940 Kidney transplant status: Secondary | ICD-10-CM | POA: Diagnosis not present

## 2015-04-01 DIAGNOSIS — Z1239 Encounter for other screening for malignant neoplasm of breast: Secondary | ICD-10-CM | POA: Diagnosis not present

## 2015-04-01 DIAGNOSIS — Z0000 Encounter for general adult medical examination without abnormal findings: Secondary | ICD-10-CM | POA: Diagnosis not present

## 2015-04-01 DIAGNOSIS — E784 Other hyperlipidemia: Secondary | ICD-10-CM | POA: Diagnosis not present

## 2015-04-01 DIAGNOSIS — I129 Hypertensive chronic kidney disease with stage 1 through stage 4 chronic kidney disease, or unspecified chronic kidney disease: Secondary | ICD-10-CM | POA: Diagnosis not present

## 2015-04-01 DIAGNOSIS — N182 Chronic kidney disease, stage 2 (mild): Secondary | ICD-10-CM | POA: Diagnosis not present

## 2015-04-03 ENCOUNTER — Other Ambulatory Visit: Payer: Self-pay | Admitting: Cardiology

## 2015-04-03 DIAGNOSIS — E669 Obesity, unspecified: Secondary | ICD-10-CM | POA: Diagnosis not present

## 2015-04-03 DIAGNOSIS — I10 Essential (primary) hypertension: Secondary | ICD-10-CM | POA: Diagnosis not present

## 2015-04-03 DIAGNOSIS — R079 Chest pain, unspecified: Secondary | ICD-10-CM

## 2015-04-03 DIAGNOSIS — R0609 Other forms of dyspnea: Secondary | ICD-10-CM | POA: Diagnosis not present

## 2015-04-03 DIAGNOSIS — M1990 Unspecified osteoarthritis, unspecified site: Secondary | ICD-10-CM | POA: Diagnosis not present

## 2015-04-03 DIAGNOSIS — E785 Hyperlipidemia, unspecified: Secondary | ICD-10-CM | POA: Diagnosis not present

## 2015-04-12 ENCOUNTER — Encounter (HOSPITAL_COMMUNITY)
Admission: RE | Admit: 2015-04-12 | Discharge: 2015-04-12 | Disposition: A | Discharge status: Home / Self Care | Payer: Medicare PPO | Source: Ambulatory Visit | Attending: Cardiology | Admitting: Cardiology

## 2015-04-12 DIAGNOSIS — R0609 Other forms of dyspnea: Secondary | ICD-10-CM | POA: Diagnosis not present

## 2015-04-12 DIAGNOSIS — I10 Essential (primary) hypertension: Secondary | ICD-10-CM | POA: Diagnosis not present

## 2015-04-12 DIAGNOSIS — R079 Chest pain, unspecified: Secondary | ICD-10-CM

## 2015-04-12 DIAGNOSIS — E785 Hyperlipidemia, unspecified: Secondary | ICD-10-CM | POA: Diagnosis not present

## 2015-04-12 DIAGNOSIS — I208 Other forms of angina pectoris: Secondary | ICD-10-CM | POA: Diagnosis not present

## 2015-04-12 MED ORDER — REGADENOSON 0.4 MG/5ML IV SOLN: 0.4000 mg | Freq: Once | INTRAVENOUS | Status: DC

## 2015-04-12 MED ORDER — TECHNETIUM TC 99M SESTAMIBI - CARDIOLITE
30.0000 | Freq: Once | INTRAVENOUS | Status: AC | PRN
  Administered 2015-04-12: 10:00:00 30 via INTRAVENOUS

## 2015-04-12 MED ORDER — REGADENOSON 0.4 MG/5ML IV SOLN
INTRAVENOUS | Status: AC
Start: 2015-04-12 — End: 2015-04-12
  Administered 2015-04-12: 0.4 mg via INTRAVENOUS
  Filled 2015-04-12: qty 5

## 2015-04-12 MED ORDER — TECHNETIUM TC 99M SESTAMIBI GENERIC - CARDIOLITE
10.0000 | Freq: Once | INTRAVENOUS | Status: AC | PRN
  Administered 2015-04-12: 10 via INTRAVENOUS

## 2015-04-15 DIAGNOSIS — E669 Obesity, unspecified: Secondary | ICD-10-CM | POA: Diagnosis not present

## 2015-04-15 DIAGNOSIS — E785 Hyperlipidemia, unspecified: Secondary | ICD-10-CM | POA: Diagnosis not present

## 2015-04-15 DIAGNOSIS — R0609 Other forms of dyspnea: Secondary | ICD-10-CM | POA: Diagnosis not present

## 2015-04-15 DIAGNOSIS — I10 Essential (primary) hypertension: Secondary | ICD-10-CM | POA: Diagnosis not present

## 2015-04-15 DIAGNOSIS — M1990 Unspecified osteoarthritis, unspecified site: Secondary | ICD-10-CM | POA: Diagnosis not present

## 2015-04-15 DIAGNOSIS — I4891 Unspecified atrial fibrillation: Secondary | ICD-10-CM | POA: Diagnosis not present

## 2015-04-18 ENCOUNTER — Encounter (HOSPITAL_COMMUNITY): Payer: Self-pay | Admitting: Cardiology

## 2015-04-18 ENCOUNTER — Encounter (HOSPITAL_COMMUNITY)
Admission: RE | Disposition: A | Discharge status: Home / Self Care | Payer: Self-pay | Source: Ambulatory Visit | Attending: Cardiology

## 2015-04-18 ENCOUNTER — Ambulatory Visit (HOSPITAL_COMMUNITY)
Admission: RE | Admit: 2015-04-18 | Discharge: 2015-04-18 | Disposition: A | Discharge status: Home / Self Care | Payer: Medicare PPO | Source: Ambulatory Visit | Attending: Cardiology | Admitting: Cardiology

## 2015-04-18 DIAGNOSIS — E669 Obesity, unspecified: Secondary | ICD-10-CM | POA: Diagnosis not present

## 2015-04-18 DIAGNOSIS — I208 Other forms of angina pectoris: Secondary | ICD-10-CM | POA: Diagnosis not present

## 2015-04-18 DIAGNOSIS — E785 Hyperlipidemia, unspecified: Secondary | ICD-10-CM | POA: Diagnosis not present

## 2015-04-18 DIAGNOSIS — R0609 Other forms of dyspnea: Secondary | ICD-10-CM | POA: Diagnosis not present

## 2015-04-18 DIAGNOSIS — I209 Angina pectoris, unspecified: Secondary | ICD-10-CM | POA: Diagnosis not present

## 2015-04-18 DIAGNOSIS — I4891 Unspecified atrial fibrillation: Secondary | ICD-10-CM | POA: Diagnosis not present

## 2015-04-18 DIAGNOSIS — I10 Essential (primary) hypertension: Secondary | ICD-10-CM | POA: Diagnosis not present

## 2015-04-18 HISTORY — PX: CARDIAC CATHETERIZATION: SHX172

## 2015-04-18 LAB — POCT I-STAT, CHEM 8
BUN: 19 mg/dL (ref 6–20)
Calcium, Ion: 1.2 mmol/L (ref 1.13–1.30)
Chloride: 102 mmol/L (ref 101–111)
Creatinine, Ser: 1 mg/dL (ref 0.44–1.00)
Glucose, Bld: 101 mg/dL — ABNORMAL HIGH (ref 65–99)
HCT: 44 % (ref 36.0–46.0)
Hemoglobin: 15 g/dL (ref 12.0–15.0)
Potassium: 3.3 mmol/L — ABNORMAL LOW (ref 3.5–5.1)
Sodium: 144 mmol/L (ref 135–145)
TCO2: 28 mmol/L (ref 0–100)

## 2015-04-18 SURGERY — LEFT HEART CATH AND CORONARY ANGIOGRAPHY

## 2015-04-18 MED ORDER — ACETAMINOPHEN 325 MG PO TABS: 650.0000 mg | ORAL_TABLET | ORAL | Status: DC | PRN

## 2015-04-18 MED ORDER — MIDAZOLAM HCL 2 MG/2ML IJ SOLN
INTRAMUSCULAR | Status: AC
  Filled 2015-04-18: qty 4

## 2015-04-18 MED ORDER — DIPHENHYDRAMINE HCL 50 MG/ML IJ SOLN
INTRAMUSCULAR | Status: AC
  Administered 2015-04-18: 25 mg via INTRAVENOUS
  Filled 2015-04-18: qty 1

## 2015-04-18 MED ORDER — SODIUM CHLORIDE 0.9 % WEIGHT BASED INFUSION
3.0000 mL/kg/h | INTRAVENOUS | Status: DC
  Administered 2015-04-18: 3 mL/kg/h via INTRAVENOUS

## 2015-04-18 MED ORDER — SODIUM CHLORIDE 0.9 % IJ SOLN
3.0000 mL | INTRAMUSCULAR | Status: DC | PRN
Start: 2015-04-18 — End: 2015-04-18

## 2015-04-18 MED ORDER — SODIUM CHLORIDE 0.9 % IV SOLN: 250.0000 mL | INTRAVENOUS | Status: DC | PRN

## 2015-04-18 MED ORDER — SODIUM CHLORIDE 0.9 % WEIGHT BASED INFUSION
1.0000 mL/kg/h | INTRAVENOUS | Status: DC
  Administered 2015-04-18: 1 mL/kg/h via INTRAVENOUS

## 2015-04-18 MED ORDER — IOHEXOL 350 MG/ML SOLN
INTRAVENOUS | Status: DC | PRN
  Administered 2015-04-18: 70 mL via INTRA_ARTERIAL

## 2015-04-18 MED ORDER — FAMOTIDINE IN NACL 20-0.9 MG/50ML-% IV SOLN
20.0000 mg | Freq: Once | INTRAVENOUS | Status: AC
  Administered 2015-04-18: 20 mg via INTRAVENOUS

## 2015-04-18 MED ORDER — SODIUM CHLORIDE 0.9 % IJ SOLN: 3.0000 mL | INTRAMUSCULAR | Status: DC | PRN

## 2015-04-18 MED ORDER — DIPHENHYDRAMINE HCL 50 MG/ML IJ SOLN
25.0000 mg | Freq: Once | INTRAMUSCULAR | Status: AC
  Administered 2015-04-18: 25 mg via INTRAVENOUS

## 2015-04-18 MED ORDER — NITROGLYCERIN 1 MG/10 ML FOR IR/CATH LAB
INTRA_ARTERIAL | Status: AC
  Filled 2015-04-18: qty 10

## 2015-04-18 MED ORDER — METHYLPREDNISOLONE SODIUM SUCC 125 MG IJ SOLR
INTRAMUSCULAR | Status: AC
  Administered 2015-04-18: 125 mg via INTRAVENOUS
  Filled 2015-04-18: qty 2

## 2015-04-18 MED ORDER — HEPARIN (PORCINE) IN NACL 2-0.9 UNIT/ML-% IJ SOLN
INTRAMUSCULAR | Status: AC
Start: 2015-04-18 — End: 2015-04-18
  Filled 2015-04-18: qty 1500

## 2015-04-18 MED ORDER — ONDANSETRON HCL 4 MG/2ML IJ SOLN: 4.0000 mg | Freq: Four times a day (QID) | INTRAMUSCULAR | Status: DC | PRN

## 2015-04-18 MED ORDER — ASPIRIN 81 MG PO CHEW
81.0000 mg | CHEWABLE_TABLET | ORAL | Status: AC
  Administered 2015-04-18: 81 mg via ORAL

## 2015-04-18 MED ORDER — MIDAZOLAM HCL 2 MG/2ML IJ SOLN
INTRAMUSCULAR | Status: DC | PRN
  Administered 2015-04-18: 1 mg via INTRAVENOUS

## 2015-04-18 MED ORDER — NITROGLYCERIN 1 MG/10 ML FOR IR/CATH LAB
INTRA_ARTERIAL | Status: DC | PRN
  Administered 2015-04-18: 25 mL

## 2015-04-18 MED ORDER — SODIUM CHLORIDE 0.9 % IV SOLN: INTRAVENOUS | Status: AC

## 2015-04-18 MED ORDER — ASPIRIN 81 MG PO CHEW
CHEWABLE_TABLET | ORAL | Status: AC
  Filled 2015-04-18: qty 1

## 2015-04-18 MED ORDER — FENTANYL CITRATE (PF) 100 MCG/2ML IJ SOLN
INTRAMUSCULAR | Status: DC | PRN
  Administered 2015-04-18: 25 ug via INTRAVENOUS

## 2015-04-18 MED ORDER — FENTANYL CITRATE (PF) 100 MCG/2ML IJ SOLN
INTRAMUSCULAR | Status: AC
  Filled 2015-04-18: qty 4

## 2015-04-18 MED ORDER — FAMOTIDINE IN NACL 20-0.9 MG/50ML-% IV SOLN
INTRAVENOUS | Status: AC
  Administered 2015-04-18: 20 mg via INTRAVENOUS
  Filled 2015-04-18: qty 50

## 2015-04-18 MED ORDER — POTASSIUM CHLORIDE CRYS ER 20 MEQ PO TBCR
EXTENDED_RELEASE_TABLET | ORAL | Status: AC
  Filled 2015-04-18: qty 2

## 2015-04-18 MED ORDER — POTASSIUM CHLORIDE CRYS ER 20 MEQ PO TBCR
40.0000 meq | EXTENDED_RELEASE_TABLET | Freq: Once | ORAL | Status: AC
  Administered 2015-04-18: 40 meq via ORAL
  Filled 2015-04-18: qty 2

## 2015-04-18 MED ORDER — LIDOCAINE HCL (PF) 1 % IJ SOLN
INTRAMUSCULAR | Status: AC
Start: 2015-04-18 — End: 2015-04-18
  Filled 2015-04-18: qty 30

## 2015-04-18 MED ORDER — METHYLPREDNISOLONE SODIUM SUCC 125 MG IJ SOLR
125.0000 mg | Freq: Once | INTRAMUSCULAR | Status: AC
  Administered 2015-04-18: 125 mg via INTRAVENOUS

## 2015-04-18 MED ORDER — SODIUM CHLORIDE 0.9 % IJ SOLN: 3.0000 mL | Freq: Two times a day (BID) | INTRAMUSCULAR | Status: DC

## 2015-04-18 SURGICAL SUPPLY — 7 items
CATH INFINITI 5FR MULTPACK ANG (CATHETERS) ×2 IMPLANT
KIT HEART LEFT (KITS) ×2 IMPLANT
PACK CARDIAC CATHETERIZATION (CUSTOM PROCEDURE TRAY) ×2 IMPLANT
SHEATH PINNACLE 5F 10CM (SHEATH) ×2 IMPLANT
SYR MEDRAD MARK V 150ML (SYRINGE) ×2 IMPLANT
TRANSDUCER W/STOPCOCK (MISCELLANEOUS) ×2 IMPLANT
WIRE EMERALD 3MM-J .035X150CM (WIRE) ×2 IMPLANT

## 2015-04-18 NOTE — Progress Notes (Signed)
Site area: right groin a 5 french arterial sheath was removed  Site Prior to Removal:  Level 0  Pressure Applied For 15 MINUTES    Minutes Beginning at 0835a  Manual:   Yes.    Patient Status During Pull:  stable  Post Pull Groin Site:  Level 0  Post Pull Instructions Given:  Yes.    Post Pull Pulses Present:  Yes.    Dressing Applied:  Yes.    Comments:  VS remain stable during sheath pull.  Pt denies any discomfort at site

## 2015-04-18 NOTE — H&P (Signed)
Printed H&P in the chart need to be scanned

## 2015-04-18 NOTE — Interval H&P Note (Signed)
Cath Lab Visit (complete for each Cath Lab visit)  Clinical Evaluation Leading to the Procedure:   ACS: No.  Non-ACS:    Anginal Classification: CCS III  Anti-ischemic medical therapy: Maximal Therapy (2 or more classes of medications)  Non-Invasive Test Results: High-risk stress test findings: cardiac mortality >3%/year  Prior CABG: No previous CABG      History and Physical Interval Note:  04/18/2015 7:44 AM  Sarah Gilbert  has presented today for surgery, with the diagnosis of cp/adnormal stress test  The various methods of treatment have been discussed with the patient and family. After consideration of risks, benefits and other options for treatment, the patient has consented to  Procedure(s): Left Heart Cath and Coronary Angiography (N/A) as a surgical intervention .  The patient's history has been reviewed, patient examined, no change in status, stable for surgery.  I have reviewed the patient's chart and labs.  Questions were answered to the patient's satisfaction.     Sarah Gilbert

## 2015-04-18 NOTE — Discharge Instructions (Signed)

## 2015-04-30 DIAGNOSIS — I10 Essential (primary) hypertension: Secondary | ICD-10-CM | POA: Diagnosis not present

## 2015-04-30 DIAGNOSIS — M1990 Unspecified osteoarthritis, unspecified site: Secondary | ICD-10-CM | POA: Diagnosis not present

## 2015-04-30 DIAGNOSIS — E785 Hyperlipidemia, unspecified: Secondary | ICD-10-CM | POA: Diagnosis not present

## 2015-04-30 DIAGNOSIS — I482 Chronic atrial fibrillation: Secondary | ICD-10-CM | POA: Diagnosis not present

## 2015-04-30 DIAGNOSIS — I2510 Atherosclerotic heart disease of native coronary artery without angina pectoris: Secondary | ICD-10-CM | POA: Diagnosis not present

## 2015-04-30 DIAGNOSIS — E669 Obesity, unspecified: Secondary | ICD-10-CM | POA: Diagnosis not present

## 2015-05-01 DIAGNOSIS — I10 Essential (primary) hypertension: Secondary | ICD-10-CM | POA: Diagnosis not present

## 2015-05-01 DIAGNOSIS — I4891 Unspecified atrial fibrillation: Secondary | ICD-10-CM | POA: Insufficient documentation

## 2015-05-01 DIAGNOSIS — N186 End stage renal disease: Secondary | ICD-10-CM | POA: Diagnosis not present

## 2015-05-01 DIAGNOSIS — I482 Chronic atrial fibrillation: Secondary | ICD-10-CM | POA: Diagnosis not present

## 2015-05-01 DIAGNOSIS — E1122 Type 2 diabetes mellitus with diabetic chronic kidney disease: Secondary | ICD-10-CM | POA: Diagnosis not present

## 2015-05-01 DIAGNOSIS — Z4822 Encounter for aftercare following kidney transplant: Secondary | ICD-10-CM | POA: Diagnosis not present

## 2015-05-01 DIAGNOSIS — K219 Gastro-esophageal reflux disease without esophagitis: Secondary | ICD-10-CM | POA: Diagnosis not present

## 2015-05-01 DIAGNOSIS — E785 Hyperlipidemia, unspecified: Secondary | ICD-10-CM | POA: Diagnosis not present

## 2015-05-01 DIAGNOSIS — E876 Hypokalemia: Secondary | ICD-10-CM | POA: Diagnosis not present

## 2015-05-01 DIAGNOSIS — Z5181 Encounter for therapeutic drug level monitoring: Secondary | ICD-10-CM | POA: Diagnosis not present

## 2015-05-01 DIAGNOSIS — E780 Pure hypercholesterolemia: Secondary | ICD-10-CM | POA: Diagnosis not present

## 2015-05-01 DIAGNOSIS — I120 Hypertensive chronic kidney disease with stage 5 chronic kidney disease or end stage renal disease: Secondary | ICD-10-CM | POA: Diagnosis not present

## 2015-05-01 DIAGNOSIS — D899 Disorder involving the immune mechanism, unspecified: Secondary | ICD-10-CM | POA: Diagnosis not present

## 2015-05-01 DIAGNOSIS — Z7901 Long term (current) use of anticoagulants: Secondary | ICD-10-CM | POA: Insufficient documentation

## 2015-05-01 DIAGNOSIS — Z940 Kidney transplant status: Secondary | ICD-10-CM | POA: Diagnosis not present

## 2015-05-02 DIAGNOSIS — Z940 Kidney transplant status: Secondary | ICD-10-CM | POA: Diagnosis not present

## 2015-05-28 DIAGNOSIS — I10 Essential (primary) hypertension: Secondary | ICD-10-CM | POA: Diagnosis not present

## 2015-05-28 DIAGNOSIS — I4891 Unspecified atrial fibrillation: Secondary | ICD-10-CM | POA: Diagnosis not present

## 2015-05-28 DIAGNOSIS — E785 Hyperlipidemia, unspecified: Secondary | ICD-10-CM | POA: Diagnosis not present

## 2015-05-30 DIAGNOSIS — Z940 Kidney transplant status: Secondary | ICD-10-CM | POA: Diagnosis not present

## 2015-06-03 ENCOUNTER — Other Ambulatory Visit: Payer: Self-pay | Admitting: Orthopedic Surgery

## 2015-06-06 ENCOUNTER — Encounter (HOSPITAL_COMMUNITY): Payer: Self-pay

## 2015-06-06 ENCOUNTER — Encounter (HOSPITAL_COMMUNITY)
Admission: RE | Admit: 2015-06-06 | Discharge: 2015-06-06 | Disposition: A | Discharge status: Home / Self Care | Payer: Medicare PPO | Source: Ambulatory Visit | Attending: Orthopedic Surgery | Admitting: Orthopedic Surgery

## 2015-06-06 DIAGNOSIS — I10 Essential (primary) hypertension: Secondary | ICD-10-CM | POA: Insufficient documentation

## 2015-06-06 DIAGNOSIS — Z79899 Other long term (current) drug therapy: Secondary | ICD-10-CM | POA: Insufficient documentation

## 2015-06-06 DIAGNOSIS — I4891 Unspecified atrial fibrillation: Secondary | ICD-10-CM | POA: Insufficient documentation

## 2015-06-06 DIAGNOSIS — E785 Hyperlipidemia, unspecified: Secondary | ICD-10-CM | POA: Insufficient documentation

## 2015-06-06 DIAGNOSIS — Z940 Kidney transplant status: Secondary | ICD-10-CM | POA: Diagnosis not present

## 2015-06-06 DIAGNOSIS — Z01812 Encounter for preprocedural laboratory examination: Secondary | ICD-10-CM | POA: Diagnosis not present

## 2015-06-06 DIAGNOSIS — M19011 Primary osteoarthritis, right shoulder: Secondary | ICD-10-CM | POA: Insufficient documentation

## 2015-06-06 DIAGNOSIS — Z01818 Encounter for other preprocedural examination: Secondary | ICD-10-CM | POA: Insufficient documentation

## 2015-06-06 DIAGNOSIS — Z7902 Long term (current) use of antithrombotics/antiplatelets: Secondary | ICD-10-CM | POA: Insufficient documentation

## 2015-06-06 DIAGNOSIS — E079 Disorder of thyroid, unspecified: Secondary | ICD-10-CM | POA: Diagnosis not present

## 2015-06-06 HISTORY — DX: Epistaxis: R04.0

## 2015-06-06 HISTORY — DX: Unspecified osteoarthritis, unspecified site: M19.90

## 2015-06-06 HISTORY — DX: Presence of spectacles and contact lenses: Z97.3

## 2015-06-06 HISTORY — DX: Nausea with vomiting, unspecified: R11.2

## 2015-06-06 HISTORY — DX: Other specified postprocedural states: Z98.890

## 2015-06-06 HISTORY — DX: Unspecified atrial fibrillation: I48.91

## 2015-06-06 HISTORY — DX: Personal history of other diseases of the respiratory system: Z87.09

## 2015-06-06 LAB — BASIC METABOLIC PANEL
Anion gap: 10 (ref 5–15)
BUN: 10 mg/dL (ref 6–20)
CO2: 24 mmol/L (ref 22–32)
Calcium: 10.2 mg/dL (ref 8.9–10.3)
Chloride: 111 mmol/L (ref 101–111)
Creatinine, Ser: 0.82 mg/dL (ref 0.44–1.00)
GFR calc Af Amer: >60 mL/min (ref >60)
GFR calc non Af Amer: >60 mL/min (ref >60)
Glucose, Bld: 70 mg/dL (ref 65–99)
Potassium: 3.2 mmol/L — ABNORMAL LOW (ref 3.5–5.1)
Sodium: 145 mmol/L (ref 135–145)

## 2015-06-06 LAB — CBC
HCT: 38.6 % (ref 36.0–46.0)
Hemoglobin: 12.8 g/dL (ref 12.0–15.0)
MCH: 31.8 pg (ref 26.0–34.0)
MCHC: 33.2 g/dL (ref 30.0–36.0)
MCV: 96 fL (ref 78.0–100.0)
Platelets: 159 10*3/uL (ref 150–400)
RBC: 4.02 MIL/uL (ref 3.87–5.11)
RDW: 13.3 % (ref 11.5–15.5)
WBC: 3.9 10*3/uL — ABNORMAL LOW (ref 4.0–10.5)

## 2015-06-06 LAB — SURGICAL PCR SCREEN
MRSA, PCR: POSITIVE — AB
Staphylococcus aureus: POSITIVE — AB

## 2015-06-06 NOTE — Progress Notes (Signed)
I notified patient of post ive PCR.  Patient reports that her pharmacy Is [address] [place], [name].  I called a prescription to [facility], [place], [name]

## 2015-06-06 NOTE — Progress Notes (Addendum)
I called a prescription for Mupirocin ointment to [facility], [place], [place], [name]

## 2015-06-06 NOTE — Progress Notes (Signed)
Patient was instructed to take her last dose of Xarelto on 06/14/2015.  PT-INR will be drawn DOS.  PCP - Dr. Glendale Chard Cardiologist - Dr. Terrence Dupont  EKG - 04/18/15 CXR - denies  Echo-2011 - Epic Stress test - denies Cardiac Cath - 04/2015  Patient denies shortness of breath and chest pain at PAT appointment.

## 2015-06-06 NOTE — Pre-Procedure Instructions (Signed)
    Sarah Gilbert  06/06/2015      [facility] [name], [place] AT [address] [name] [address] Phone: [phone] Fax: [phone]    Your procedure is scheduled on Tuesday, November 1st, 2016.  Report to [facility] Admitting at 8:30 A.M.  Call this number if you have problems the morning of surgery:  [phone]   Remember:  Do not eat food or drink liquids after midnight.   Take these medicines the morning of surgery with A SIP OF WATER: Acetaminophen (Tylenol) if needed, Amlodipine (Norvasc), Hydrocodone-acetaminophen (Norco/Vicodin) if needed, Metoprolol (Lopressor), Ranitidine (Zantac).   Per MD's instructions, take your last dose of Xarelto on 06/14/2015.   7 days prior to surgery, stop taking the following: Aspirin, NSAIDS, Aleve, Naproxen, Ibuprofen, Advil, Motrin, BC's, Goody's, fish oil, all herbal medications, and all vitamins.    Do not wear jewelry, make-up or nail polish.  Do not wear lotions, powders, or perfumes.  You may NOT wear deodorant.  Do not shave 48 hours prior to surgery.   Do not bring valuables to the hospital.  [facility] is not responsible for any belongings or valuables.  Contacts, dentures or bridgework may not be worn into surgery.  Leave your suitcase in the car.  After surgery it may be brought to your room.  For patients admitted to the hospital, discharge time will be determined by your treatment team.  Patients discharged the day of surgery will not be allowed to drive home.   Special instructions:  See attached.   Please read over the following fact sheets that you were given. Pain Booklet, Coughing and Deep Breathing, MRSA Information and Surgical Site Infection Prevention

## 2015-06-07 ENCOUNTER — Encounter (HOSPITAL_COMMUNITY): Payer: Self-pay

## 2015-06-07 NOTE — Progress Notes (Addendum)
Anesthesia Chart Review:  Pt is 69 year old female scheduled for R total shoulder arthroplasty on 06/18/2015 with Dr. Mardelle Matte.   Cardiologist is Dr. Terrence Dupont, last office visit 04/30/15. PCP is Dr. Glendale Chard.   PMH includes: HTN, atrial fibrillation, thyroid disease, anemia, hyperlipidemia, ESRD, post-op N/V. Never smoker. BMI 40. S/p kidney transplant.   Medications include: amlodipine, lipitor, lasix, metoprolol, cellcept, potassium, zantac, xarelto, prograf.   Preoperative labs reviewed.  PT to be obtained DOS.   EKG 04/18/2015: Atrial fibrillation. LVH. Nonspecific T wave abnormality  Cardiac cath 04/18/2015 (for abnormal stress test):  -LV shows good LV systolic function -Left main was short which was patent -LAD was patent -Diagonal 1 was moderate size which was patent left circumflex was patent OM1 and OM 2 with a very small fissure patent OM 34 and 5 very small which are patent -RCA was patent patient has codominant coronary system.  Pt has cardiac clearance for surgery from Dr. Terrence Dupont, and medical clearance from Othelia Pulling, FNP.   If no changes, I anticipate pt can proceed with surgery as scheduled.   Willeen Cass, FNP-BC [facility] Stay [facility]/Anesthesiology Phone: [phone] 06/07/2015 4:53 PM

## 2015-06-17 MED ORDER — CEFAZOLIN SODIUM-DEXTROSE 2-3 GM-% IV SOLR
2.0000 g | INTRAVENOUS | Status: AC
  Administered 2015-06-18: 2 g via INTRAVENOUS
  Filled 2015-06-17 (×2): qty 50

## 2015-06-18 ENCOUNTER — Inpatient Hospital Stay (HOSPITAL_COMMUNITY): Payer: Medicare PPO

## 2015-06-18 ENCOUNTER — Inpatient Hospital Stay (HOSPITAL_COMMUNITY): Payer: Medicare PPO | Admitting: Emergency Medicine

## 2015-06-18 ENCOUNTER — Encounter (HOSPITAL_COMMUNITY)
Admission: RE | Disposition: A | Discharge status: Home / Self Care | Payer: Self-pay | Source: Ambulatory Visit | Attending: Orthopedic Surgery

## 2015-06-18 ENCOUNTER — Encounter (HOSPITAL_COMMUNITY): Payer: Self-pay

## 2015-06-18 ENCOUNTER — Inpatient Hospital Stay (HOSPITAL_COMMUNITY): Payer: Medicare PPO | Admitting: Anesthesiology

## 2015-06-18 ENCOUNTER — Inpatient Hospital Stay (HOSPITAL_COMMUNITY)
Admission: RE | Admit: 2015-06-18 | Discharge: 2015-06-21 | DRG: 483 | Disposition: A | Discharge status: Home / Self Care | Payer: Medicare PPO | Source: Ambulatory Visit | Attending: Orthopedic Surgery | Admitting: Orthopedic Surgery

## 2015-06-18 DIAGNOSIS — I252 Old myocardial infarction: Secondary | ICD-10-CM

## 2015-06-18 DIAGNOSIS — Z940 Kidney transplant status: Secondary | ICD-10-CM

## 2015-06-18 DIAGNOSIS — Z96611 Presence of right artificial shoulder joint: Secondary | ICD-10-CM

## 2015-06-18 DIAGNOSIS — Z23 Encounter for immunization: Secondary | ICD-10-CM | POA: Diagnosis not present

## 2015-06-18 DIAGNOSIS — Z6841 Body Mass Index (BMI) 40.0 and over, adult: Secondary | ICD-10-CM

## 2015-06-18 DIAGNOSIS — M19011 Primary osteoarthritis, right shoulder: Secondary | ICD-10-CM | POA: Diagnosis not present

## 2015-06-18 DIAGNOSIS — M1990 Unspecified osteoarthritis, unspecified site: Secondary | ICD-10-CM | POA: Diagnosis present

## 2015-06-18 DIAGNOSIS — M12811 Other specific arthropathies, not elsewhere classified, right shoulder: Secondary | ICD-10-CM | POA: Diagnosis present

## 2015-06-18 DIAGNOSIS — Z7902 Long term (current) use of antithrombotics/antiplatelets: Secondary | ICD-10-CM | POA: Diagnosis not present

## 2015-06-18 DIAGNOSIS — E876 Hypokalemia: Secondary | ICD-10-CM | POA: Diagnosis not present

## 2015-06-18 DIAGNOSIS — I4891 Unspecified atrial fibrillation: Secondary | ICD-10-CM | POA: Diagnosis not present

## 2015-06-18 DIAGNOSIS — I2510 Atherosclerotic heart disease of native coronary artery without angina pectoris: Secondary | ICD-10-CM | POA: Diagnosis not present

## 2015-06-18 DIAGNOSIS — Z471 Aftercare following joint replacement surgery: Secondary | ICD-10-CM | POA: Diagnosis not present

## 2015-06-18 DIAGNOSIS — Z79899 Other long term (current) drug therapy: Secondary | ICD-10-CM

## 2015-06-18 DIAGNOSIS — I10 Essential (primary) hypertension: Secondary | ICD-10-CM | POA: Diagnosis present

## 2015-06-18 DIAGNOSIS — Z96619 Presence of unspecified artificial shoulder joint: Secondary | ICD-10-CM

## 2015-06-18 DIAGNOSIS — M75101 Unspecified rotator cuff tear or rupture of right shoulder, not specified as traumatic: Secondary | ICD-10-CM | POA: Diagnosis present

## 2015-06-18 DIAGNOSIS — E785 Hyperlipidemia, unspecified: Secondary | ICD-10-CM | POA: Diagnosis not present

## 2015-06-18 DIAGNOSIS — G8918 Other acute postprocedural pain: Secondary | ICD-10-CM | POA: Diagnosis not present

## 2015-06-18 HISTORY — DX: Unspecified rotator cuff tear or rupture of right shoulder, not specified as traumatic: M75.101

## 2015-06-18 HISTORY — PX: TOTAL SHOULDER ARTHROPLASTY: SHX126

## 2015-06-18 HISTORY — DX: Other specific arthropathies, not elsewhere classified, right shoulder: M12.811

## 2015-06-18 LAB — PROTIME-INR
INR: 1.51 — ABNORMAL HIGH (ref 0.00–1.49)
Prothrombin Time: 18.3 seconds — ABNORMAL HIGH (ref 11.6–15.2)

## 2015-06-18 SURGERY — ARTHROPLASTY, SHOULDER, TOTAL
Anesthesia: Regional | Site: Shoulder | Laterality: Right

## 2015-06-18 MED ORDER — PROPOFOL 10 MG/ML IV BOLUS
INTRAVENOUS | Status: AC
  Filled 2015-06-18: qty 20

## 2015-06-18 MED ORDER — MENTHOL 3 MG MT LOZG: 1.0000 | LOZENGE | OROMUCOSAL | Status: DC | PRN

## 2015-06-18 MED ORDER — EPHEDRINE SULFATE 50 MG/ML IJ SOLN
INTRAMUSCULAR | Status: DC | PRN
  Administered 2015-06-18: 10 mg via INTRAVENOUS
  Administered 2015-06-18: 25 mg via INTRAVENOUS
  Administered 2015-06-18: 15 mg via INTRAVENOUS

## 2015-06-18 MED ORDER — HYDROMORPHONE HCL 1 MG/ML IJ SOLN
0.5000 mg | INTRAMUSCULAR | Status: DC | PRN
  Administered 2015-06-19: 0.5 mg via INTRAVENOUS
  Filled 2015-06-18: qty 1

## 2015-06-18 MED ORDER — DOCUSATE SODIUM 100 MG PO CAPS
100.0000 mg | ORAL_CAPSULE | Freq: Two times a day (BID) | ORAL | Status: DC
  Administered 2015-06-18 – 2015-06-21 (×6): 100 mg via ORAL
  Filled 2015-06-18 (×6): qty 1

## 2015-06-18 MED ORDER — ONDANSETRON HCL 4 MG/2ML IJ SOLN
4.0000 mg | Freq: Once | INTRAMUSCULAR | Status: AC | PRN
  Administered 2015-06-18: 4 mg via INTRAVENOUS

## 2015-06-18 MED ORDER — FENTANYL CITRATE (PF) 250 MCG/5ML IJ SOLN
INTRAMUSCULAR | Status: AC
  Filled 2015-06-18: qty 5

## 2015-06-18 MED ORDER — ONDANSETRON HCL 4 MG/2ML IJ SOLN: 4.0000 mg | Freq: Four times a day (QID) | INTRAMUSCULAR | Status: DC | PRN

## 2015-06-18 MED ORDER — HYDROMORPHONE HCL 1 MG/ML IJ SOLN
INTRAMUSCULAR | Status: AC
  Filled 2015-06-18: qty 1

## 2015-06-18 MED ORDER — ACETAMINOPHEN 325 MG PO TABS: 650.0000 mg | ORAL_TABLET | Freq: Four times a day (QID) | ORAL | Status: DC | PRN

## 2015-06-18 MED ORDER — MYCOPHENOLATE MOFETIL 250 MG PO CAPS
500.0000 mg | ORAL_CAPSULE | Freq: Two times a day (BID) | ORAL | Status: DC
  Administered 2015-06-18 – 2015-06-21 (×6): 500 mg via ORAL
  Filled 2015-06-18 (×7): qty 2

## 2015-06-18 MED ORDER — ONDANSETRON HCL 4 MG/2ML IJ SOLN
INTRAMUSCULAR | Status: AC
  Filled 2015-06-18: qty 4

## 2015-06-18 MED ORDER — SENNA-DOCUSATE SODIUM 8.6-50 MG PO TABS: 2.0000 | ORAL_TABLET | Freq: Every day | ORAL | Status: DC

## 2015-06-18 MED ORDER — ALUM & MAG HYDROXIDE-SIMETH 200-200-20 MG/5ML PO SUSP: 30.0000 mL | ORAL | Status: DC | PRN

## 2015-06-18 MED ORDER — METHOCARBAMOL 500 MG PO TABS
500.0000 mg | ORAL_TABLET | Freq: Four times a day (QID) | ORAL | Status: DC | PRN
  Administered 2015-06-18 – 2015-06-21 (×3): 500 mg via ORAL
  Filled 2015-06-18 (×4): qty 1

## 2015-06-18 MED ORDER — RENA-VITE PO TABS
1.0000 | ORAL_TABLET | Freq: Every day | ORAL | Status: DC
  Administered 2015-06-18 – 2015-06-20 (×3): 1 via ORAL
  Filled 2015-06-18 (×3): qty 1

## 2015-06-18 MED ORDER — SENNA 8.6 MG PO TABS
1.0000 | ORAL_TABLET | Freq: Two times a day (BID) | ORAL | Status: DC
  Administered 2015-06-18 – 2015-06-21 (×6): 8.6 mg via ORAL
  Filled 2015-06-18 (×6): qty 1

## 2015-06-18 MED ORDER — OXYCODONE HCL 5 MG PO TABS
5.0000 mg | ORAL_TABLET | ORAL | Status: DC | PRN
  Administered 2015-06-18 – 2015-06-19 (×7): 10 mg via ORAL
  Administered 2015-06-21: 5 mg via ORAL
  Administered 2015-06-21: 10 mg via ORAL
  Administered 2015-06-21: 5 mg via ORAL
  Filled 2015-06-18: qty 1
  Filled 2015-06-18 (×4): qty 2
  Filled 2015-06-18: qty 1
  Filled 2015-06-18 (×3): qty 2

## 2015-06-18 MED ORDER — CEFAZOLIN SODIUM 1-5 GM-% IV SOLN
1.0000 g | Freq: Four times a day (QID) | INTRAVENOUS | Status: AC
  Administered 2015-06-18 – 2015-06-19 (×3): 1 g via INTRAVENOUS
  Filled 2015-06-18 (×3): qty 50

## 2015-06-18 MED ORDER — METHOCARBAMOL 1000 MG/10ML IJ SOLN
500.0000 mg | Freq: Four times a day (QID) | INTRAVENOUS | Status: DC | PRN
  Filled 2015-06-18: qty 5

## 2015-06-18 MED ORDER — ACETAMINOPHEN 650 MG RE SUPP: 650.0000 mg | Freq: Four times a day (QID) | RECTAL | Status: DC | PRN

## 2015-06-18 MED ORDER — CLINDAMYCIN PHOSPHATE 900 MG/50ML IV SOLN
INTRAVENOUS | Status: AC
  Administered 2015-06-18: 900 mg via INTRAVENOUS
  Filled 2015-06-18: qty 50

## 2015-06-18 MED ORDER — PROPOFOL 10 MG/ML IV BOLUS
INTRAVENOUS | Status: DC | PRN
Start: 2015-06-18 — End: 2015-06-18
  Administered 2015-06-18: 150 mg via INTRAVENOUS

## 2015-06-18 MED ORDER — MIDAZOLAM HCL 2 MG/2ML IJ SOLN
INTRAMUSCULAR | Status: AC
  Administered 2015-06-18: 1 mg
  Filled 2015-06-18: qty 2

## 2015-06-18 MED ORDER — SODIUM CHLORIDE 0.9 % IR SOLN
Status: DC | PRN
  Administered 2015-06-18: 3000 mL

## 2015-06-18 MED ORDER — BACLOFEN 10 MG PO TABS: 10.0000 mg | ORAL_TABLET | Freq: Three times a day (TID) | ORAL | Status: DC

## 2015-06-18 MED ORDER — BISACODYL 10 MG RE SUPP: 10.0000 mg | Freq: Every day | RECTAL | Status: DC | PRN

## 2015-06-18 MED ORDER — FENTANYL CITRATE (PF) 100 MCG/2ML IJ SOLN
INTRAMUSCULAR | Status: AC
  Filled 2015-06-18: qty 2

## 2015-06-18 MED ORDER — FAMOTIDINE 20 MG PO TABS
20.0000 mg | ORAL_TABLET | Freq: Every day | ORAL | Status: DC
Start: 2015-06-19 — End: 2015-06-21
  Administered 2015-06-19 – 2015-06-21 (×3): 20 mg via ORAL
  Filled 2015-06-18 (×3): qty 1

## 2015-06-18 MED ORDER — EPHEDRINE SULFATE 50 MG/ML IJ SOLN
INTRAMUSCULAR | Status: AC
  Filled 2015-06-18: qty 2

## 2015-06-18 MED ORDER — ONDANSETRON HCL 4 MG/2ML IJ SOLN
INTRAMUSCULAR | Status: DC | PRN
  Administered 2015-06-18: 4 mg via INTRAVENOUS

## 2015-06-18 MED ORDER — METOPROLOL TARTRATE 50 MG PO TABS
50.0000 mg | ORAL_TABLET | Freq: Two times a day (BID) | ORAL | Status: DC
  Administered 2015-06-18 – 2015-06-21 (×5): 50 mg via ORAL
  Filled 2015-06-18 (×6): qty 1

## 2015-06-18 MED ORDER — POTASSIUM CHLORIDE CRYS ER 20 MEQ PO TBCR
20.0000 meq | EXTENDED_RELEASE_TABLET | Freq: Every day | ORAL | Status: DC
  Administered 2015-06-19 – 2015-06-21 (×3): 20 meq via ORAL
  Filled 2015-06-18 (×3): qty 1

## 2015-06-18 MED ORDER — LIDOCAINE HCL (CARDIAC) 20 MG/ML IV SOLN
INTRAVENOUS | Status: AC
  Filled 2015-06-18: qty 20

## 2015-06-18 MED ORDER — NEOSTIGMINE METHYLSULFATE 10 MG/10ML IV SOLN
INTRAVENOUS | Status: AC
  Filled 2015-06-18: qty 1

## 2015-06-18 MED ORDER — METHOCARBAMOL 500 MG PO TABS
ORAL_TABLET | ORAL | Status: AC
  Filled 2015-06-18: qty 1

## 2015-06-18 MED ORDER — ARTIFICIAL TEARS OP OINT
TOPICAL_OINTMENT | OPHTHALMIC | Status: AC
  Filled 2015-06-18: qty 3.5

## 2015-06-18 MED ORDER — POTASSIUM CHLORIDE IN NACL 20-0.45 MEQ/L-% IV SOLN
INTRAVENOUS | Status: DC
  Administered 2015-06-19: 08:00:00 via INTRAVENOUS
  Filled 2015-06-18 (×8): qty 1000

## 2015-06-18 MED ORDER — RIVAROXABAN 20 MG PO TABS
20.0000 mg | ORAL_TABLET | Freq: Every day | ORAL | Status: DC
  Administered 2015-06-19 – 2015-06-20 (×2): 20 mg via ORAL
  Filled 2015-06-18 (×2): qty 1

## 2015-06-18 MED ORDER — LIDOCAINE HCL 4 % MT SOLN
OROMUCOSAL | Status: DC | PRN
  Administered 2015-06-18: 4 mL via TOPICAL

## 2015-06-18 MED ORDER — PHENYLEPHRINE HCL 10 MG/ML IJ SOLN
10.0000 mg | INTRAVENOUS | Status: DC | PRN
  Administered 2015-06-18: 40 ug/min via INTRAVENOUS

## 2015-06-18 MED ORDER — 0.9 % SODIUM CHLORIDE (POUR BTL) OPTIME
TOPICAL | Status: DC | PRN
  Administered 2015-06-18: 1000 mL

## 2015-06-18 MED ORDER — FENTANYL CITRATE (PF) 100 MCG/2ML IJ SOLN
25.0000 ug | INTRAMUSCULAR | Status: DC | PRN
  Administered 2015-06-18: 50 ug via INTRAVENOUS

## 2015-06-18 MED ORDER — METOCLOPRAMIDE HCL 5 MG PO TABS
5.0000 mg | ORAL_TABLET | Freq: Three times a day (TID) | ORAL | Status: DC | PRN
  Filled 2015-06-18: qty 2

## 2015-06-18 MED ORDER — LIDOCAINE HCL (CARDIAC) 20 MG/ML IV SOLN
INTRAVENOUS | Status: DC | PRN
  Administered 2015-06-18: 3 mL via INTRAVENOUS

## 2015-06-18 MED ORDER — ROCURONIUM BROMIDE 100 MG/10ML IV SOLN
INTRAVENOUS | Status: DC | PRN
  Administered 2015-06-18: 50 mg via INTRAVENOUS

## 2015-06-18 MED ORDER — GLYCOPYRROLATE 0.2 MG/ML IJ SOLN
INTRAMUSCULAR | Status: AC
  Filled 2015-06-18: qty 3

## 2015-06-18 MED ORDER — OXYCODONE-ACETAMINOPHEN 5-325 MG PO TABS: 1.0000 | ORAL_TABLET | Freq: Four times a day (QID) | ORAL | Status: DC | PRN

## 2015-06-18 MED ORDER — BUPIVACAINE-EPINEPHRINE (PF) 0.5% -1:200000 IJ SOLN
INTRAMUSCULAR | Status: DC | PRN
  Administered 2015-06-18: 20 mL via PERINEURAL

## 2015-06-18 MED ORDER — ONDANSETRON HCL 4 MG/2ML IJ SOLN
INTRAMUSCULAR | Status: AC
  Filled 2015-06-18: qty 2

## 2015-06-18 MED ORDER — CLINDAMYCIN PHOSPHATE 900 MG/50ML IV SOLN
900.0000 mg | Freq: Once | INTRAVENOUS | Status: AC
  Administered 2015-06-18: 900 mg via INTRAVENOUS

## 2015-06-18 MED ORDER — METOCLOPRAMIDE HCL 5 MG/ML IJ SOLN
5.0000 mg | Freq: Three times a day (TID) | INTRAMUSCULAR | Status: DC | PRN
  Filled 2015-06-18: qty 2

## 2015-06-18 MED ORDER — TACROLIMUS 1 MG PO CAPS
2.0000 mg | ORAL_CAPSULE | Freq: Two times a day (BID) | ORAL | Status: DC
  Administered 2015-06-18 – 2015-06-21 (×6): 2 mg via ORAL
  Filled 2015-06-18 (×7): qty 2

## 2015-06-18 MED ORDER — LACTATED RINGERS IV SOLN
INTRAVENOUS | Status: DC
  Administered 2015-06-18 (×3): via INTRAVENOUS

## 2015-06-18 MED ORDER — OXYCODONE HCL 5 MG PO TABS
ORAL_TABLET | ORAL | Status: AC
  Filled 2015-06-18: qty 2

## 2015-06-18 MED ORDER — FENTANYL CITRATE (PF) 100 MCG/2ML IJ SOLN
INTRAMUSCULAR | Status: AC
  Administered 2015-06-18: 50 ug
  Filled 2015-06-18: qty 2

## 2015-06-18 MED ORDER — PHENOL 1.4 % MT LIQD: 1.0000 | OROMUCOSAL | Status: DC | PRN

## 2015-06-18 MED ORDER — ONDANSETRON HCL 4 MG PO TABS: 4.0000 mg | ORAL_TABLET | Freq: Three times a day (TID) | ORAL | Status: DC | PRN

## 2015-06-18 MED ORDER — FENTANYL CITRATE (PF) 100 MCG/2ML IJ SOLN
INTRAMUSCULAR | Status: DC | PRN
  Administered 2015-06-18 (×2): 100 ug via INTRAVENOUS
  Administered 2015-06-18: 50 ug via INTRAVENOUS

## 2015-06-18 MED ORDER — POLYETHYLENE GLYCOL 3350 17 G PO PACK
17.0000 g | PACK | Freq: Every day | ORAL | Status: DC | PRN
Start: 2015-06-18 — End: 2015-06-21

## 2015-06-18 MED ORDER — DIPHENHYDRAMINE HCL 12.5 MG/5ML PO ELIX
12.5000 mg | ORAL_SOLUTION | ORAL | Status: DC | PRN
  Filled 2015-06-18: qty 10

## 2015-06-18 MED ORDER — GLYCOPYRROLATE 0.2 MG/ML IJ SOLN
INTRAMUSCULAR | Status: DC | PRN
  Administered 2015-06-18: .2 mg via INTRAVENOUS
  Administered 2015-06-18: .4 mg via INTRAVENOUS

## 2015-06-18 MED ORDER — MAGNESIUM CITRATE PO SOLN: 1.0000 | Freq: Once | ORAL | Status: DC | PRN

## 2015-06-18 MED ORDER — SODIUM CHLORIDE 0.9 % IJ SOLN
INTRAMUSCULAR | Status: AC
  Filled 2015-06-18: qty 20

## 2015-06-18 MED ORDER — ATORVASTATIN CALCIUM 10 MG PO TABS
20.0000 mg | ORAL_TABLET | Freq: Every day | ORAL | Status: DC
  Administered 2015-06-19 – 2015-06-21 (×3): 20 mg via ORAL
  Filled 2015-06-18 (×3): qty 2

## 2015-06-18 MED ORDER — VANCOMYCIN HCL IN DEXTROSE 1-5 GM/200ML-% IV SOLN: 1000.0000 mg | Freq: Once | INTRAVENOUS | Status: DC

## 2015-06-18 MED ORDER — FUROSEMIDE 40 MG PO TABS
40.0000 mg | ORAL_TABLET | Freq: Every day | ORAL | Status: DC
  Administered 2015-06-19 – 2015-06-21 (×3): 40 mg via ORAL
  Filled 2015-06-18 (×3): qty 1

## 2015-06-18 MED ORDER — NEOSTIGMINE METHYLSULFATE 10 MG/10ML IV SOLN
INTRAVENOUS | Status: DC | PRN
  Administered 2015-06-18: 3 mg via INTRAVENOUS
  Administered 2015-06-18: 1 mg via INTRAVENOUS

## 2015-06-18 MED ORDER — ONDANSETRON HCL 4 MG PO TABS: 4.0000 mg | ORAL_TABLET | Freq: Four times a day (QID) | ORAL | Status: DC | PRN

## 2015-06-18 MED ORDER — AMLODIPINE BESYLATE 5 MG PO TABS
5.0000 mg | ORAL_TABLET | Freq: Every day | ORAL | Status: DC
  Administered 2015-06-19 – 2015-06-21 (×3): 5 mg via ORAL
  Filled 2015-06-18 (×3): qty 1

## 2015-06-18 SURGICAL SUPPLY — 64 items
BIT DRILL 5/64X5 DISP (BIT) ×3 IMPLANT
BLADE SAW SAG 73X25 THK (BLADE) ×2
BLADE SAW SGTL 73X25 THK (BLADE) ×1 IMPLANT
BLADE SAW SGTL MED 73X18.5 STR (BLADE) ×3 IMPLANT
BRUSH FEMORAL CANAL (MISCELLANEOUS) IMPLANT
CAPT SHLDR REVTOTAL 2 ×3 IMPLANT
CLOSURE STERI-STRIP 1/2X4 (GAUZE/BANDAGES/DRESSINGS) ×1
CLSR STERI-STRIP ANTIMIC 1/2X4 (GAUZE/BANDAGES/DRESSINGS) ×2 IMPLANT
COVER SURGICAL LIGHT HANDLE (MISCELLANEOUS) ×3 IMPLANT
COVER TABLE BACK 60X90 (DRAPES) IMPLANT
DRAPE ORTHO SPLIT 77X108 STRL (DRAPES) ×4
DRAPE PROXIMA HALF (DRAPES) ×3 IMPLANT
DRAPE SURG ORHT 6 SPLT 77X108 (DRAPES) ×2 IMPLANT
DRAPE U-SHAPE 47X51 STRL (DRAPES) ×3 IMPLANT
DRSG MEPILEX BORDER 4X8 (GAUZE/BANDAGES/DRESSINGS) ×3 IMPLANT
DURAPREP 26ML APPLICATOR (WOUND CARE) ×3 IMPLANT
ELECT REM PT RETURN 9FT ADLT (ELECTROSURGICAL) ×3
ELECTRODE REM PT RTRN 9FT ADLT (ELECTROSURGICAL) ×1 IMPLANT
EVACUATOR 1/8 PVC DRAIN (DRAIN) IMPLANT
FACESHIELD WRAPAROUND (MASK) ×3 IMPLANT
GLOVE BIOGEL PI IND STRL 8 (GLOVE) ×1 IMPLANT
GLOVE BIOGEL PI INDICATOR 8 (GLOVE) ×2
GLOVE BIOGEL PI ORTHO PRO SZ8 (GLOVE) ×2
GLOVE ORTHO TXT STRL SZ7.5 (GLOVE) ×3 IMPLANT
GLOVE PI ORTHO PRO STRL SZ8 (GLOVE) ×1 IMPLANT
GLOVE SURG ORTHO 8.0 STRL STRW (GLOVE) ×6 IMPLANT
GOWN STRL REUS W/ TWL LRG LVL3 (GOWN DISPOSABLE) ×1 IMPLANT
GOWN STRL REUS W/ TWL XL LVL3 (GOWN DISPOSABLE) ×1 IMPLANT
GOWN STRL REUS W/TWL 2XL LVL3 (GOWN DISPOSABLE) ×3 IMPLANT
GOWN STRL REUS W/TWL LRG LVL3 (GOWN DISPOSABLE) ×2
GOWN STRL REUS W/TWL XL LVL3 (GOWN DISPOSABLE) ×2
HANDPIECE INTERPULSE COAX TIP (DISPOSABLE) ×2
HOOD PEEL AWAY FACE SHEILD DIS (HOOD) ×3 IMPLANT
KIT BASIN OR (CUSTOM PROCEDURE TRAY) ×3 IMPLANT
KIT ROOM TURNOVER OR (KITS) ×3 IMPLANT
MANIFOLD NEPTUNE II (INSTRUMENTS) ×3 IMPLANT
NEEDLE 1/2 CIR CATGUT .05X1.09 (NEEDLE) IMPLANT
NEEDLE HYPO 25GX1X1/2 BEV (NEEDLE) IMPLANT
NS IRRIG 1000ML POUR BTL (IV SOLUTION) ×3 IMPLANT
PACK SHOULDER (CUSTOM PROCEDURE TRAY) ×3 IMPLANT
PAD ARMBOARD 7.5X6 YLW CONV (MISCELLANEOUS) ×6 IMPLANT
PIN STEINMANN THREADED TIP (PIN) IMPLANT
PIN THREADED REVERSE (PIN) IMPLANT
SET HNDPC FAN SPRY TIP SCT (DISPOSABLE) ×1 IMPLANT
SLING ARM IMMOBILIZER LRG (SOFTGOODS) IMPLANT
SLING ARM IMMOBILIZER MED (SOFTGOODS) IMPLANT
SMARTMIX MINI TOWER (MISCELLANEOUS) ×3
SPONGE LAP 18X18 X RAY DECT (DISPOSABLE) ×3 IMPLANT
SUCTION FRAZIER TIP 10 FR DISP (SUCTIONS) ×3 IMPLANT
SUPPORT WRAP ARM LG (MISCELLANEOUS) ×3 IMPLANT
SUT FIBERWIRE #2 38 REV NDL BL (SUTURE)
SUT MAXBRAID (SUTURE) IMPLANT
SUT MNCRL AB 4-0 PS2 18 (SUTURE) IMPLANT
SUT VIC AB 0 CT1 27 (SUTURE) ×2
SUT VIC AB 0 CT1 27XBRD ANBCTR (SUTURE) ×1 IMPLANT
SUT VIC AB 2-0 CT1 27 (SUTURE)
SUT VIC AB 2-0 CT1 TAPERPNT 27 (SUTURE) IMPLANT
SUT VIC AB 3-0 SH 8-18 (SUTURE) ×3 IMPLANT
SUTURE FIBERWR#2 38 REV NDL BL (SUTURE) IMPLANT
SYR CONTROL 10ML LL (SYRINGE) IMPLANT
TOWEL OR 17X24 6PK STRL BLUE (TOWEL DISPOSABLE) ×3 IMPLANT
TOWEL OR 17X26 10 PK STRL BLUE (TOWEL DISPOSABLE) ×3 IMPLANT
TOWER SMARTMIX MINI (MISCELLANEOUS) ×1 IMPLANT
WATER STERILE IRR 1000ML POUR (IV SOLUTION) ×3 IMPLANT

## 2015-06-18 NOTE — Op Note (Signed)
06/18/2015  1:42 PM  PATIENT:  Sarah Gilbert    PRE-OPERATIVE DIAGNOSIS:  Right shoulder rotator cuff arthropathy  POST-OPERATIVE DIAGNOSIS:  Same  PROCEDURE:  Right reverse total shoulder replacement  SURGEON:  Johnny Bridge, MD  PHYSICIAN ASSISTANT: Joya Gaskins, OPA-C, present and scrubbed throughout the case, critical for completion in a timely fashion, and for retraction, instrumentation, and closure.  ANESTHESIA:   General  PREOPERATIVE INDICATIONS:  Sarah Gilbert is a  69 y.o. female with a diagnosis of right rotator cuff arthropathy who failed conservative measures and elected for surgical management.    The risks benefits and alternatives were discussed with the patient preoperatively including but not limited to the risks of infection, bleeding, nerve injury, cardiopulmonary complications, the need for revision surgery, dislocation, brachial plexus palsy, incomplete relief of pain, among others, and the patient was willing to proceed. She also tested positive for both staph aureus and MRSA, and was given Ancef and the mycin preoperatively, she has a severe allergy to vancomycin, which caused her to have a skin outbreak on both the palms and soles of her feet. This happened twice.  OPERATIVE IMPLANTS: Biomet size 10 humeral stem press-fit standard with a 44 mm reverse shoulder arthroplasty tray with a standard liner and a 36 mm glenosphere with a mini baseplate and 4 locking screws and one central nonlocking screw.  OPERATIVE FINDINGS: There was substantial advanced rotator cuff arthropathy. The subscapularis was significantly contracted, and was not mobile enough to be able to achieve repair once we had advanced the humerus with the reverse replacement configuration. The biceps tendon was dislocated and the subscapularis was chronically torn. The bone quality was reasonably good. There was substantial osteophyte for pressure on the humerus. The shoulder was fairly contracted  and difficult to access given her morbid obesity as well as the chronic contractures, and I had to do a fairly aggressive head cut, which I actually cut 3 times in order to get satisfactory access to the glenoid.  The 10 humeral implant was placed slightly proud, in order to restore length, and also because I was between a size 9 and a size 10 but the size 9 was somewhat loose, so I felt that the 10 gave me better restoration of length, and also restored the medullary fill better than the 9.  OPERATIVE PROCEDURE: The patient was brought to the operating room and placed in the supine position. General anesthesia was administered. IV antibiotics were given. Time out was performed. The upper extremity was prepped and draped in usual sterile fashion. The patient was in a beachchair position. Deltopectoral approach was carried out. The biceps was tenodesed to the pectoralis tendon with #2 Maxbraid. The subscapularis was released off of the bone.   I then performed circumferential releases of the humerus, and then dislocated the head, and then reamed with the reamer to the above named size.  I then applied the jig, and cut the humeral head in 30 of retroversion, and then turned my attention to the glenoid.  Deep retractors were placed, and I resected the labrum, and then placed a guidepin into the center position on the glenoid, with slight inferior inclination. I then reamed over the guidepin, and this created a small metaphyseal cancellus blush inferiorly, removing just the cartilage to the subchondral bone superiorly. The base plate was selected and impacted place, and then I secured it centrally with a nonlocking screw, and I had excellent purchase both inferiorly and superiorly. I placed a short  locking screws on anterior and posterior aspects.  I then turned my attention to the glenosphere, and impacted this into place, placing slight inferior offset (set on B).   The glenosphere was completely  seated, and had engagement of the [facility] taper. I then turned my attention back to the humerus.  I sequentially broached, and then trialed, and was found to restore soft tissue tension, and it had 2 finger tightness. Therefore the above named components were selected. The shoulder felt stable throughout functional motion.  Before I placed the real prosthesis I had also placed a total of 3 #2 Max braid, however after placement of all of the hardware I felt that the subscapularis was not able to be advanced to the humerus and so it was not repaired.  I then impacted the real prosthesis into place, as well as the real humeral tray, and reduced the shoulder. The shoulder had excellent motion, and was stable, and I irrigated the wounds copiously.   I then irrigated the shoulder copiously once more, repaired the deltopectoral interval with Vicryl followed by subcutaneous Vicryl with Steri-Strips and sterile gauze for the skin. The patient was awakened and returned back in stable and satisfactory condition. There no complications and She tolerated the procedure well.

## 2015-06-18 NOTE — Discharge Instructions (Signed)

## 2015-06-18 NOTE — Anesthesia Preprocedure Evaluation (Addendum)
Anesthesia Evaluation  Patient identified by MRN, date of birth, ID band Patient awake    Reviewed: Allergy & Precautions, NPO status , Patient's Chart, lab work & pertinent test results, reviewed documented beta blocker date and time   History of Anesthesia Complications (+) PONV and history of anesthetic complications  Airway Mallampati: II  TM Distance: >3 FB Neck ROM: Full    Dental  (+) Teeth Intact, Dental Advisory Given   Pulmonary neg pulmonary ROS,    Pulmonary exam normal breath sounds clear to auscultation       Cardiovascular hypertension, Pt. on medications and Pt. on home beta blockers (-) angina+ CAD  (-) Past MI Normal cardiovascular exam Rhythm:Regular Rate:Normal     Neuro/Psych negative neurological ROS     GI/Hepatic negative GI ROS, Neg liver ROS,   Endo/Other  Morbid obesity  Renal/GU Renal disease (s/p kidney transplant 2011)     Musculoskeletal  (+) Arthritis , Osteoarthritis,    Abdominal   Peds  Hematology negative hematology ROS (+)   Anesthesia Other Findings Day of surgery medications reviewed with the patient.  Reproductive/Obstetrics                            Anesthesia Physical Anesthesia Plan  ASA: III  Anesthesia Plan: General and Regional   Post-op Pain Management: GA combined w/ Regional for post-op pain   Induction: Intravenous  Airway Management Planned: Oral ETT  Additional Equipment:   Intra-op Plan:   Post-operative Plan: Extubation in OR  Informed Consent: I have reviewed the patients History and Physical, chart, labs and discussed the procedure including the risks, benefits and alternatives for the proposed anesthesia with the patient or authorized representative who has indicated his/her understanding and acceptance.   Dental advisory given  Plan Discussed with: CRNA  Anesthesia Plan Comments: (Risks/benefits of general  anesthesia discussed with patient including risk of damage to teeth, lips, gum, and tongue, nausea/vomiting, allergic reactions to medications, and the possibility of heart attack, stroke and death.  All patient questions answered.  Patient wishes to proceed.  Discussed risks and benefits of interscalene block including failure, bleeding, infection, nerve damage, weakness, shortness of breath, pneumothorax. Questions answered. Patient consents to block. )        Anesthesia Quick Evaluation

## 2015-06-18 NOTE — Progress Notes (Signed)
Off monitor pending room availability. OOB to recliner after ambulating to bathroom.

## 2015-06-18 NOTE — Transfer of Care (Signed)
Immediate Anesthesia Transfer of Care Note  Patient: Sarah Gilbert  Procedure(s) Performed: Procedure(s): RIGHT TOTAL SHOULDER ARTHROPLASTY (Right)  Patient Location: PACU  Anesthesia Type:General  Level of Consciousness: awake, alert  and oriented  Airway & Oxygen Therapy: Patient Spontanous Breathing and Patient connected to face mask oxygen  Post-op Assessment: Report given to RN, Post -op Vital signs reviewed and stable and Patient moving all extremities X 4  Post vital signs: Reviewed and stable  Last Vitals:  Filed Vitals:   06/18/15 1405  BP:   Pulse:   Temp: 36.5 C  Resp:     Complications: No apparent anesthesia complications

## 2015-06-18 NOTE — Anesthesia Postprocedure Evaluation (Signed)
  Anesthesia Post-op Note  Patient: Sarah Gilbert  Procedure(s) Performed: Procedure(s) (LRB): RIGHT TOTAL SHOULDER ARTHROPLASTY (Right)  Patient Location: PACU  Anesthesia Type: GA combined with regional for post-op pain  Level of Consciousness: awake and alert   Airway and Oxygen Therapy: Patient Spontanous Breathing  Post-op Pain: mild  Post-op Assessment: Post-op Vital signs reviewed, Patient's Cardiovascular Status Stable, Respiratory Function Stable, Patent Airway and No signs of Nausea or vomiting  Last Vitals:  Filed Vitals:   06/18/15 1845  BP: 109/76  Pulse: 78  Temp: 36.4 C  Resp: 16    Post-op Vital Signs: stable   Complications: No apparent anesthesia complications

## 2015-06-18 NOTE — Progress Notes (Signed)
Call to Dr. Mardelle Matte, reported Pt/INR, block & surgery will proceed.

## 2015-06-18 NOTE — Anesthesia Procedure Notes (Addendum)
Anesthesia Regional Block:  Interscalene brachial plexus block  Pre-Anesthetic Checklist: ,, timeout performed, Correct Patient, Correct Site, Correct Laterality, Correct Procedure, Correct Position, site marked, Risks and benefits discussed,  Surgical consent,  Pre-op evaluation,  At surgeon's request and post-op pain management  Laterality: Right  Prep: chloraprep       Needles:  Injection technique: Single-shot  Needle Type: Echogenic Stimulator Needle     Needle Length: 5cm 5 cm Needle Gauge: 22 and 22 G    Additional Needles:  Procedures: ultrasound guided (picture in chart) and nerve stimulator Interscalene brachial plexus block Narrative:  Injection made incrementally with aspirations every 5 mL.  Performed by: Personally  Anesthesiologist: Catalina Gravel  Additional Notes: Functioning IV was confirmed and monitors were applied.  A 26m 22ga Arrow echogenic stimulator needle was used. Sterile prep and drape,hand hygiene and sterile gloves were used.  Negative aspiration and negative test dose prior to incremental administration of local anesthetic. The patient tolerated the procedure well.  Ultrasound guidance: relevent anatomy identified, needle position confirmed, local anesthetic spread visualized around nerve(s), vascular puncture avoided.  Image printed for medical record.    Procedure Name: Intubation Date/Time: 06/18/2015 11:15 AM Performed by: BMariea ClontsPre-anesthesia Checklist: Emergency Drugs available, Timeout performed, Patient identified, Suction available and Patient being monitored Patient Re-evaluated:Patient Re-evaluated prior to inductionOxygen Delivery Method: Circle system utilized Preoxygenation: Pre-oxygenation with 100% oxygen Intubation Type: IV induction Ventilation: Mask ventilation without difficulty and Oral airway inserted - appropriate to patient size Laryngoscope Size: MSabra Heckand 2 Grade View: Grade II Tube type:  Oral Tube size: 7.0 mm Number of attempts: 1 Airway Equipment and Method: LTA kit utilized Placement Confirmation: ETT inserted through vocal cords under direct vision,  breath sounds checked- equal and bilateral and positive ETCO2 Tube secured with: Tape Dental Injury: Teeth and Oropharynx as per pre-operative assessment

## 2015-06-18 NOTE — Progress Notes (Signed)
Per request with Dr. Mardelle Matte- call was made to [place], [place]. Ph. , recommendation to give Clindamycin instead of Vancomycin.  Call to Dr. Mardelle Matte to confirm. Order rec'd to proceed with Clindamycin 900mg . IV over 30 mins. To begin asap & also give Ancef in succession.

## 2015-06-18 NOTE — Progress Notes (Signed)
Utilization review completed.  

## 2015-06-18 NOTE — Progress Notes (Signed)
Recliner from bathroom/off monitor

## 2015-06-18 NOTE — H&P (Signed)
PREOPERATIVE H&P  Chief Complaint: RIGHT SHOULDER pain and weakness  HPI: Sarah Gilbert is a 69 y.o. female who presents for preoperative history and physical with a diagnosis of right shoulder rotator cuff arthropathy. Symptoms are rated as moderate to severe, and have been worsening.  This is significantly impairing activities of daily living.  She has elected for surgical management. She has failed injections, activity modification, exercises, and continues to have severe weakness, inability to lift the arm. Night pain is severe. Preoperative MRI in 2010 demonstrated a severe rotator cuff tear with retraction and atrophy. She also has risk factors including a kidney transplant. She has elected for reverse shoulder replacement.  Past Medical History  Diagnosis Date  . Hypertension   . Hyperlipemia   . ESRD (end stage renal disease) ([place])   . Obesity   . Thyroid disease   . Anemia   . Bleeding nose     history of   . PONV (postoperative nausea and vomiting)   . History of bronchitis   . Arthritis   . Wears glasses   . Joint inflammation   . Atrial fibrillation [facility])    Past Surgical History  Procedure Laterality Date  . Cholecystectomy    . Appendectomy    . Abdominal hysterectomy    . Tonsillectomy    . Kidney transplant      june 2011-[facility]  . Cardiac catheterization N/A 04/18/2015    Procedure: Left Heart Cath and Coronary Angiography;  Surgeon: Charolette Forward, MD;  Location: [facility];  Service: Cardiovascular;  Laterality: N/A;  . Colonoscopy     Social History   Social History  . Marital Status: Single    Spouse Name: N/A  . Number of Children: N/A  . Years of Education: N/A   Social History Main Topics  . Smoking status: Never Smoker   . Smokeless tobacco: Not on file  . Alcohol Use: No  . Drug Use: No  . Sexual Activity: Not on file   Other Topics Concern  . Not on file   Social History Narrative   No family history on file. Allergies   Allergen Reactions  . Iohexol      Desc: iodine contrast/per dialysis center, Onset Date: [id number]   . Vancomycin Hives, Itching and Rash   Prior to Admission medications   Medication Sig Start Date End Date Taking? Authorizing Provider  acetaminophen (TYLENOL) 500 MG tablet Take 500 mg by mouth every 6 (six) hours as needed for mild pain.    Yes Historical Provider, MD  amLODipine (NORVASC) 5 MG tablet Take 5 mg by mouth daily.   Yes Historical Provider, MD  atorvastatin (LIPITOR) 20 MG tablet Take 20 mg by mouth daily.  02/05/15  Yes Historical Provider, MD  b complex-vitamin c-folic acid (NEPHRO-VITE) 0.8 MG TABS Take 0.8 mg by mouth at bedtime.   Yes Historical Provider, MD  furosemide (LASIX) 40 MG tablet Take 40 mg by mouth daily.    Yes Historical Provider, MD  metoprolol (LOPRESSOR) 50 MG tablet Take 50 mg by mouth 2 (two) times daily.   Yes Historical Provider, MD  mycophenolate (CELLCEPT) 250 MG capsule Take 500 mg by mouth 2 (two) times daily.   Yes Historical Provider, MD  potassium chloride SA (K-DUR,KLOR-CON) 20 MEQ tablet Take 20 mEq by mouth daily.    Yes Historical Provider, MD  ranitidine (ZANTAC) 150 MG tablet Take 150 mg by mouth daily.   Yes Historical Provider, MD  rivaroxaban (  XARELTO) 20 MG TABS tablet Take 20 mg by mouth daily with supper.   Yes Historical Provider, MD  tacrolimus (PROGRAF) 1 MG capsule Take 2 mg by mouth 2 (two) times daily.    Yes Historical Provider, MD  HYDROcodone-acetaminophen (NORCO/VICODIN) 5-325 MG per tablet Take 1 tablet by mouth every 6 (six) hours as needed for moderate pain.    Historical Provider, MD     Positive ROS: All other systems have been reviewed and were otherwise negative with the exception of those mentioned in the HPI and as above.  Physical Exam: General: Alert, no acute distress Cardiovascular: No pedal edema Respiratory: No cyanosis, no use of accessory musculature GI: No organomegaly, abdomen is soft and  non-tender Skin: No lesions in the area of chief complaint Neurologic: Sensation intact distally Psychiatric: Patient is competent for consent with normal mood and affect Lymphatic: No axillary or cervical lymphadenopathy  MUSCULOSKELETAL: Right shoulder active motion is 0-80 with profound weakness of the supraspinatus as well as weakness with subscapularis testing. External rotation is to neutral.  Assessment: Right shoulder advanced rotator cuff arthropathy   Plan: Plan for Procedure(s): Right reverse total shoulder replacement.  The risks benefits and alternatives were discussed with the patient including but not limited to the risks of nonoperative treatment, versus surgical intervention including infection, bleeding, nerve injury,  blood clots, cardiopulmonary complications, morbidity, mortality, among others, and they were willing to proceed. We have also discussed the risks for dislocation, axillary nerve palsy, early hardware failure, the need for revision surgery, incomplete relief of symptoms, incomplete return of function, among others.  Johnny Bridge, MD Cell [phone]   06/18/2015 10:04 AM

## 2015-06-19 ENCOUNTER — Encounter (HOSPITAL_COMMUNITY): Payer: Self-pay | Admitting: Orthopedic Surgery

## 2015-06-19 LAB — BASIC METABOLIC PANEL
Anion gap: 7 (ref 5–15)
BUN: 6 mg/dL (ref 6–20)
CO2: 28 mmol/L (ref 22–32)
Calcium: 9.6 mg/dL (ref 8.9–10.3)
Chloride: 100 mmol/L — ABNORMAL LOW (ref 101–111)
Creatinine, Ser: 0.83 mg/dL (ref 0.44–1.00)
GFR calc Af Amer: >60 mL/min (ref >60)
GFR calc non Af Amer: >60 mL/min (ref >60)
Glucose, Bld: 116 mg/dL — ABNORMAL HIGH (ref 65–99)
Potassium: 3 mmol/L — ABNORMAL LOW (ref 3.5–5.1)
Sodium: 135 mmol/L (ref 135–145)

## 2015-06-19 LAB — CBC
HCT: 34.9 % — ABNORMAL LOW (ref 36.0–46.0)
Hemoglobin: 11.3 g/dL — ABNORMAL LOW (ref 12.0–15.0)
MCH: 31.4 pg (ref 26.0–34.0)
MCHC: 32.4 g/dL (ref 30.0–36.0)
MCV: 96.9 fL (ref 78.0–100.0)
Platelets: 166 10*3/uL (ref 150–400)
RBC: 3.6 MIL/uL — ABNORMAL LOW (ref 3.87–5.11)
RDW: 13.3 % (ref 11.5–15.5)
WBC: 8 10*3/uL (ref 4.0–10.5)

## 2015-06-19 MED ORDER — INFLUENZA VAC SPLIT QUAD 0.5 ML IM SUSY
0.5000 mL | PREFILLED_SYRINGE | INTRAMUSCULAR | Status: AC
  Administered 2015-06-20: 0.5 mL via INTRAMUSCULAR
  Filled 2015-06-19: qty 0.5

## 2015-06-19 MED ORDER — WHITE PETROLATUM GEL
Status: AC
  Filled 2015-06-19: qty 1

## 2015-06-19 NOTE — Evaluation (Signed)
Occupational Therapy Evaluation Patient Details Name: Sarah Gilbert MRN: [id number] DOB: [birth year] Today's Date: 06/19/2015    History of Present Illness 69 y.o. female who presents for preoperative history and physical with a diagnosis of right shoulder rotator cuff arthropathy. S/p right reverse total shoulder arthroplasty   Clinical Impression   Pt reports she was independent with ADLs PTA. Currently pt is overall min A for ADLs and mobility. Educated pt on UB bathing/dressing technique, edema management, sling management and wear schedule, RUE positioning, shoulder precautions; pt verbalized understanding. Provided pt with shoulder handout; asked pt to read over will check back later today for elbow-hand exercises and to review. Pt plan to d/c home with 24/7 supervision from family. Recommending HHOT at this time due to decreased balance and safety with functional mobility and ADL activities. Pt would benefit from continued skilled OT services in order to maximize independence and safety with UB ADLs, UB HEP, and functional mobility prior to d/c home.     Follow Up Recommendations  Home health OT;Supervision/Assistance - 24 hour    Equipment Recommendations  None recommended by OT    Recommendations for Other Services PT consult     Precautions / Restrictions Precautions Precautions: Shoulder Type of Shoulder Precautions: Conservative protocol: NO PROM/AROM shoulder. AROM elbow, wrist, hand OK.  Shoulder Interventions: Shoulder sling/immobilizer;At all times Precaution Booklet Issued: Yes (comment) Precaution Comments: Reviewed precautions Required Braces or Orthoses: Sling Restrictions Weight Bearing Restrictions: Yes RUE Weight Bearing: Non weight bearing      Mobility Bed Mobility Overal bed mobility: Needs Assistance Bed Mobility: Supine to Sit     Supine to sit: Min guard     General bed mobility comments: Min guard for safety. No physical assist needed. VC  for NWB on RUE  Transfers Overall transfer level: Needs assistance Equipment used: None Transfers: Sit to/from Stand Sit to Stand: Min assist         General transfer comment: Min A for balance in standing. Sit to stand from EOB x 1, BSC x 1    Balance Overall balance assessment: Needs assistance Sitting-balance support: Single extremity supported;Feet supported Sitting balance-Leahy Scale: Good     Standing balance support: Single extremity supported Standing balance-Leahy Scale: Poor                              ADL Overall ADL's : Needs assistance/impaired     Grooming: Wash/dry hands;Standing;Minimal assistance                   Toilet Transfer: Ambulation;BSC;Minimal assistance Toilet Transfer Details (indicate cue type and reason): Pt holding on to walls/door during mobility in room to provide support. Toileting- Clothing Manipulation and Hygiene: Sit to/from stand;Minimal assistance (for toilet hygiene)       Functional mobility during ADLs: Minimal assistance General ADL Comments: No family present during OT eval. Educated pt on UB bathing/dressing technique, edema management techniques, shoulder precautions, positioning of R UE, sling management and wear schedule; pt verbalized understanding. Provided pt with shoulder precautions handout; asked pt to read over handout and will be back later today to answer questions and go over material.     Vision     Perception     Praxis      Pertinent Vitals/Pain Pain Assessment: 0-10 Pain Score: 2  Pain Location: R shoulder Pain Descriptors / Indicators: Aching Pain Intervention(s): Limited activity within patient's tolerance;Monitored during session;Repositioned;Premedicated before session;Ice applied  Hand Dominance Right   Extremity/Trunk Assessment Upper Extremity Assessment Upper Extremity Assessment: RUE deficits/detail RUE Deficits / Details: elbow, wrist, hand ROM WFL RUE: Unable  to fully assess due to pain;Unable to fully assess due to immobilization   Lower Extremity Assessment Lower Extremity Assessment: Defer to PT evaluation       Communication Communication Communication: No difficulties   Cognition Arousal/Alertness: Awake/alert Behavior During Therapy: WFL for tasks assessed/performed Overall Cognitive Status: Within Functional Limits for tasks assessed                     General Comments       Exercises Exercises: Shoulder     Shoulder Instructions Shoulder Instructions Donning/doffing shirt without moving shoulder: Moderate assistance (educated pt) Method for sponge bathing under operated UE: Moderate assistance (educated pt) Correct positioning of sling/immobilizer: Minimal assistance (educated pt) Sling wearing schedule (on at all times/off for ADL's): Supervision/safety (educated pt) Proper positioning of operated UE when showering: Minimal assistance (educated pt) Positioning of UE while sleeping: Minimal assistance (educated pt)    Home Living Family/patient expects to be discharged to:: Private residence Living Arrangements: Other relatives (uncle) Available Help at Discharge: Family;Available 24 hours/day Type of Home: House Home Access: Ramped entrance     Home Layout: One level     Bathroom Shower/Tub: Teacher, early years/pre: Standard Bathroom Accessibility: Yes   Home Equipment: Cane - single point;Bedside commode;Tub bench;Toilet riser;Hospital bed   Additional Comments: Pt reports her house is pretty much handicap accessible      Prior Functioning/Environment Level of Independence: Independent with assistive device(s)        Comments: Pt reports that she used a cane PTA during community mobility. No AD when ambulating in her home    OT Diagnosis: Generalized weakness;Acute pain   OT Problem List: Decreased activity tolerance;Impaired balance (sitting and/or standing);Decreased safety  awareness;Decreased knowledge of use of DME or AE;Decreased knowledge of precautions;Pain;Impaired UE functional use   OT Treatment/Interventions: Self-care/ADL training;Therapeutic exercise;DME and/or AE instruction;Patient/family education    OT Goals(Current goals can be found in the care plan section) Acute Rehab OT Goals Patient Stated Goal: return to independence OT Goal Formulation: With patient Time For Goal Achievement: 07/03/15 Potential to Achieve Goals: Good ADL Goals Pt Will Perform Upper Body Bathing:  (maintaining shoulder precautions) Pt Will Perform Upper Body Dressing:  (maintaining shoulder precuations) Pt Will Perform Tub/Shower Transfer: Tub transfer;with supervision;ambulating;tub bench Pt/caregiver will Perform Home Exercise Program: Increased ROM;Right Upper extremity;Independently;With written HEP provided  OT Frequency: Min 2X/week   Barriers to D/C:            Co-evaluation              End of Session Equipment Utilized During Treatment: Gait belt;Other (comment) (sling)  Activity Tolerance: Patient tolerated treatment well Patient left: in chair;with call bell/phone within reach   Time: 0911-0942 OT Time Calculation (min): 31 min Charges:  OT General Charges $OT Visit: 1 Procedure OT Evaluation $Initial OT Evaluation Tier I: 1 Procedure OT Treatments $Self Care/Home Management : 8-22 mins G-Codes:     Binnie Kand M.S., OTR/L Pager: [phone]  06/19/2015, 10:03 AM

## 2015-06-19 NOTE — Evaluation (Signed)
Physical Therapy Evaluation Patient Details Name: Sarah Gilbert MRN: [id number] DOB: [birth year] Today's Date: 06/19/2015   History of Present Illness  69 y.o. female who presents for preoperative history and physical with a diagnosis of right shoulder rotator cuff arthropathy. S/p right reverse total shoulder arthroplasty. PMH: ESRD, anemia, atrial fibrillation, hypertension  Clinical Impression  Patient is s/p above surgery resulting in functional limitations due to the deficits listed below (see PT Problem List).  Patient will benefit from skilled PT to increase their independence and safety with mobility to allow discharge to home with family support.        Follow Up Recommendations No PT follow up;Supervision for mobility/OOB    Equipment Recommendations  None recommended by PT;Other (comment) (patient reports having cane at home. )    Recommendations for Other Services       Precautions / Restrictions Precautions Precautions: Shoulder Type of Shoulder Precautions: Conservative protocol: NO PROM/AROM shoulder. AROM elbow, wrist, hand OK.  Shoulder Interventions: Shoulder sling/immobilizer;At all times Required Braces or Orthoses: Sling Restrictions Weight Bearing Restrictions: Yes RUE Weight Bearing: Non weight bearing      Mobility  Bed Mobility               General bed mobility comments: found in chair upon arrival  Transfers Overall transfer level: Needs assistance Equipment used: None Transfers: Sit to/from Stand Sit to Stand: Mod assist;Min assist         General transfer comment: Mod assist from chair with initial stand. Following stand from commode performed with min assist. Cues for hand placement. Patient unable to flex Rt knee enough to allow push up with RLE.   Ambulation/Gait Ambulation/Gait assistance: Min guard Ambulation Distance (Feet): 75 Feet Assistive device: Straight cane Gait Pattern/deviations: Step-through pattern;Wide base of  support Gait velocity: decreased   General Gait Details: increased lateral shifts with gait bilaterally, no loss of balance with ambulation.   Stairs            Wheelchair Mobility    Modified Rankin (Stroke Patients Only)       Balance Overall balance assessment: Needs assistance Sitting-balance support: No upper extremity supported Sitting balance-Leahy Scale: Good     Standing balance support: Single extremity supported Standing balance-Leahy Scale: Poor Standing balance comment: using cane for support with standing                             Pertinent Vitals/Pain Pain Assessment: 0-10 Pain Score: 2  Pain Location: Rt shoulder Pain Descriptors / Indicators: Sore;Aching Pain Intervention(s): Limited activity within patient's tolerance;Monitored during session;Ice applied    Home Living Family/patient expects to be discharged to:: Private residence Living Arrangements: Other (Comment) (lives with uncle) Available Help at Discharge: Family;Available 24 hours/day Type of Home: House Home Access: Ramped entrance     Home Layout: One level Home Equipment: Cane - single point      Prior Function Level of Independence: Independent with assistive device(s)         Comments: intermittant use of cane     Hand Dominance        Extremity/Trunk Assessment   Upper Extremity Assessment: Defer to OT evaluation           Lower Extremity Assessment: RLE deficits/detail RLE Deficits / Details: decreased strength reported in RLE due to arthritis and pain.        Communication   Communication: No difficulties  Cognition Arousal/Alertness: Awake/alert  Behavior During Therapy: WFL for tasks assessed/performed Overall Cognitive Status: Within Functional Limits for tasks assessed                      General Comments      Exercises        Assessment/Plan    PT Assessment Patient needs continued PT services  PT Diagnosis  Difficulty walking   PT Problem List Decreased strength;Decreased range of motion;Decreased activity tolerance;Decreased balance;Decreased mobility  PT Treatment Interventions DME instruction;Gait training;Stair training;Functional mobility training;Therapeutic activities;Therapeutic exercise;Patient/family education   PT Goals (Current goals can be found in the Care Plan section) Acute Rehab PT Goals Patient Stated Goal: get back home from the hospital PT Goal Formulation: With patient Time For Goal Achievement: 07/03/15 Potential to Achieve Goals: Good    Frequency Min 3X/week   Barriers to discharge        Co-evaluation               End of Session Equipment Utilized During Treatment: Gait belt Activity Tolerance: Patient tolerated treatment well Patient left: in chair;with call bell/phone within reach;with family/visitor present;Other (comment) (Rt LE supported with pillows) Nurse Communication: Mobility status         Time: [id number] PT Time Calculation (min) (ACUTE ONLY): 30 min   Charges:   PT Evaluation $Initial PT Evaluation Tier I: 1 Procedure PT Treatments $Gait Training: 8-22 mins   PT G Codes:       Cassell Clement, PT, CSCS Pager [phone] Office [phone]  06/19/2015, 2:02 PM

## 2015-06-19 NOTE — Progress Notes (Signed)
Patient ID: Sarah Gilbert, female   DOB: May 12, 1946, 69 y.o.   MRN: [id number]     Subjective:  Patient reports pain as mild.  Patient denies any CP or SOB.  In bed and in no acute distress.  Objective:   VITALS:   Filed Vitals:   06/18/15 1845 06/18/15 2159 06/19/15 0110 06/19/15 0639  BP: 109/76 138/85 135/57 109/67  Pulse: 78 94 73 96  Temp: 97.5 F (36.4 C) 97.7 F (36.5 C) 98.3 F (36.8 C) 99.3 F (37.4 C)  TempSrc: Oral Oral Oral   Resp: 16 16 16 16   Weight:      SpO2:  98% 100% 95%    ABD soft Sensation intact distally Dorsiflexion/Plantar flexion intact Incision: dressing C/D/I and no drainage Good hand and wrist motion  Lab Results  Component Value Date   WBC 3.9* 06/06/2015   HGB 12.8 06/06/2015   HCT 38.6 06/06/2015   MCV 96.0 06/06/2015   PLT 159 06/06/2015   BMET    Component Value Date/Time   NA 145 06/06/2015 1239   K 3.2* 06/06/2015 1239   CL 111 06/06/2015 1239   CO2 24 06/06/2015 1239   GLUCOSE 70 06/06/2015 1239   BUN 10 06/06/2015 1239   CREATININE 0.82 06/06/2015 1239   CALCIUM 10.2 06/06/2015 1239   GFRNONAA >60 06/06/2015 1239   GFRAA >60 06/06/2015 1239     Assessment/Plan: 1 Day Post-Op   Principal Problem:   Rotator cuff tear arthropathy of right shoulder Active Problems:   S/p reverse total shoulder arthroplasty   Advance diet Up with therapy Plan for DC home today or tomorrow Sling at all times Dry dressing PRN NWB right upper ext   DOUGLAS PARRY, BRANDON 06/19/2015, 7:04 AM  Seen and agree with above.  DC home tomorrow  Mild hypokalemia, observe.  Marchia Bond, MD Cell [phone]

## 2015-06-19 NOTE — Progress Notes (Signed)
Occupational Therapy Treatment Patient Details Name: Sarah Gilbert MRN: [id number] DOB: Dec 12, 1945 Today's Date: 06/19/2015    History of present illness 69 y.o. female who presents for preoperative history and physical with a diagnosis of right shoulder rotator cuff arthropathy. S/p right reverse total shoulder arthroplasty. PMH: ESRD, anemia, atrial fibrillation, hypertension   OT comments  Pt making progress toward OT goals. Focus of this session was on R UE exercises. Educated and demonstrated elbow, wrist, hand ROM exercises; pt return demonstrated understanding. Pt educated on completing exercises 3 x per day; pt verbalized understanding. Pts follow up recommendations have been upgraded to no OT follow up with intermittent supervision; pt to follow up per MD. Will continue to follow pt acutely.    Follow Up Recommendations  No OT follow up;Supervision - Intermittent    Equipment Recommendations  None recommended by OT    Recommendations for Other Services      Precautions / Restrictions Precautions Precautions: Shoulder Type of Shoulder Precautions: Conservative protocol: NO PROM/AROM shoulder. AROM elbow, wrist, hand OK.  Shoulder Interventions: Shoulder sling/immobilizer;At all times Precaution Comments: Reviewed precautions Required Braces or Orthoses: Sling Restrictions Weight Bearing Restrictions: Yes RUE Weight Bearing: Non weight bearing       Mobility Bed Mobility               General bed mobility comments: Not tested at this time  Transfers          General transfer comment: Not tested at this time    Balance  Standing balance comment: Not tested at this time                   ADL                                         General ADL Comments: Reviewed sling management and wear schedule; pt verbalized understanding      Vision                     Perception     Praxis      Cognition   Behavior  During Therapy: [facility] for tasks assessed/performed Overall Cognitive Status: Within Functional Limits for tasks assessed                       Extremity/Trunk Assessment             Exercises Shoulder Exercises Elbow Flexion: AROM;Right;10 reps;Supine Wrist Flexion: AROM;Right;10 reps;Supine Digit Composite Flexion: AROM;Right;10 reps;Supine Donning/doffing sling/immobilizer: Maximal assistance Correct positioning of sling/immobilizer: Minimal assistance ROM for elbow, wrist and digits of operated UE: Supervision/safety Sling wearing schedule (on at all times/off for ADL's): Supervision/safety   Shoulder Instructions Shoulder Instructions Donning/doffing sling/immobilizer: Maximal assistance Correct positioning of sling/immobilizer: Minimal assistance ROM for elbow, wrist and digits of operated UE: Supervision/safety Sling wearing schedule (on at all times/off for ADL's): Supervision/safety     General Comments      Pertinent Vitals/ Pain       Pain Assessment: Faces Pain Score: 2  Faces Pain Scale: Hurts even more Pain Location: R shoulder Pain Descriptors / Indicators: Aching;Sore;Grimacing Pain Intervention(s): Limited activity within patient's tolerance;Monitored during session;Ice applied;Patient requesting pain meds-RN notified  Home Living Family/patient expects to be discharged to:: Private residence Living Arrangements: Other (Comment) (lives with uncle) Available Help at Discharge: Family;Available 24 hours/day Type of  Home: House Home Access: Ramped entrance     Home Layout: One level               Home Equipment: [place] - single point          Prior Functioning/Environment Level of Independence: Independent with assistive device(s)        Comments: intermittant use of cane   Frequency Min 2X/week     Progress Toward Goals  OT Goals(current goals can now be found in the care plan section)  Progress towards OT goals: Progressing  toward goals  Acute Rehab OT Goals Patient Stated Goal: none stated  Plan Discharge plan needs to be updated    Co-evaluation                 End of Session Equipment Utilized During Treatment: Other (comment) (sling)   Activity Tolerance Patient tolerated treatment well   Patient Left in bed;with call bell/phone within reach;with family/visitor present   Nurse Communication Patient requests pain meds        Time: [id number] OT Time Calculation (min): 17 min  Charges: OT General Charges $OT Visit: 1 Procedure OT Treatments $Therapeutic Exercise: 8-22 mins  Binnie Kand M.S., OTR/L Pager: [phone]  06/19/2015, 2:11 PM

## 2015-06-20 DIAGNOSIS — I2510 Atherosclerotic heart disease of native coronary artery without angina pectoris: Secondary | ICD-10-CM | POA: Diagnosis not present

## 2015-06-20 DIAGNOSIS — M75101 Unspecified rotator cuff tear or rupture of right shoulder, not specified as traumatic: Secondary | ICD-10-CM | POA: Diagnosis not present

## 2015-06-20 DIAGNOSIS — E785 Hyperlipidemia, unspecified: Secondary | ICD-10-CM | POA: Diagnosis not present

## 2015-06-20 DIAGNOSIS — E876 Hypokalemia: Secondary | ICD-10-CM | POA: Diagnosis not present

## 2015-06-20 DIAGNOSIS — Z940 Kidney transplant status: Secondary | ICD-10-CM | POA: Diagnosis not present

## 2015-06-20 DIAGNOSIS — I4891 Unspecified atrial fibrillation: Secondary | ICD-10-CM | POA: Diagnosis not present

## 2015-06-20 DIAGNOSIS — I10 Essential (primary) hypertension: Secondary | ICD-10-CM | POA: Diagnosis not present

## 2015-06-20 DIAGNOSIS — M1990 Unspecified osteoarthritis, unspecified site: Secondary | ICD-10-CM | POA: Diagnosis not present

## 2015-06-20 DIAGNOSIS — Z6841 Body Mass Index (BMI) 40.0 and over, adult: Secondary | ICD-10-CM | POA: Diagnosis not present

## 2015-06-20 NOTE — Progress Notes (Signed)
Patient ID: Sarah Gilbert, female   DOB: Aug 04, 1946, 69 y.o.   MRN: [id number]     Subjective:  Patient reports pain as mild to moderate.  More pain last evening but somewhat better today.  Denies any CP or SOB  Objective:   VITALS:   Filed Vitals:   06/19/15 0639 06/19/15 1300 06/19/15 2131 06/20/15 0515  BP: 109/67 101/63 119/46 106/77  Pulse: 96 98 87 84  Temp: 99.3 F (37.4 C) 99 F (37.2 C) 99.7 F (37.6 C) 99.4 F (37.4 C)  TempSrc:  Oral    Resp: 16 16 15 16   Weight:      SpO2: 95% 96% 94% 95%    ABD soft Sensation intact distally Incision: dressing C/D/I and no drainage Good wrist and hand motion ans well as finger motion   Lab Results  Component Value Date   WBC 8.0 06/19/2015   HGB 11.3* 06/19/2015   HCT 34.9* 06/19/2015   MCV 96.9 06/19/2015   PLT 166 06/19/2015   BMET    Component Value Date/Time   NA 135 06/19/2015 0634   K 3.0* 06/19/2015 0634   CL 100* 06/19/2015 0634   CO2 28 06/19/2015 0634   GLUCOSE 116* 06/19/2015 0634   BUN 6 06/19/2015 0634   CREATININE 0.83 06/19/2015 0634   CALCIUM 9.6 06/19/2015 0634   GFRNONAA >60 06/19/2015 0634   GFRAA >60 06/19/2015 0634     Assessment/Plan: 2 Days Post-Op   Principal Problem:   Rotator cuff tear arthropathy of right shoulder Active Problems:   S/p reverse total shoulder arthroplasty   Advance diet Up with therapy Plan for discharge tomorrow  nwb right upper ext Sling at all times    DOUGLAS Joya Gaskins 06/20/2015, 7:03 AM  Seen and agree.  Plan dc home tomorrow if safe from PT/OT standpoint.  Marchia Bond, MD Cell [phone]

## 2015-06-20 NOTE — Progress Notes (Signed)
Physical Therapy Treatment Patient Details Name: Sarah Gilbert MRN: [id number] DOB: 27-Sep-1945 Today's Date: 06/20/2015    History of Present Illness 69 y.o. female who presents for preoperative history and physical with a diagnosis of right shoulder rotator cuff arthropathy. S/p right reverse total shoulder arthroplasty. PMH: ESRD, anemia, atrial fibrillation, hypertension    PT Comments    Assisted pt OOB to bathroom.  Assisted with toileting only MinGuard Assist.  Assisted with amb in hallway with SPC an increased distance.  Pt reports she has a ramp enterance.   Follow Up Recommendations  No PT follow up;Supervision for mobility/OOB     Equipment Recommendations  None recommended by PT    Recommendations for Other Services       Precautions / Restrictions Precautions Precautions: Shoulder Type of Shoulder Precautions: Conservative protocol: NO PROM/AROM shoulder. AROM elbow, wrist, hand OK.  Shoulder Interventions: Shoulder sling/immobilizer;At all times Required Braces or Orthoses: Sling Restrictions Weight Bearing Restrictions: Yes RUE Weight Bearing: Non weight bearing    Mobility  Bed Mobility Overal bed mobility: Needs Assistance Bed Mobility: Supine to Sit     Supine to sit: Min assist     General bed mobility comments: then requirted MAX assist to scoot to [facility]  Transfers Overall transfer level: Needs assistance Equipment used: Straight cane Transfers: Sit to/from Stand Sit to Stand: Min guard;Min assist         General transfer comment: good safety cognition  Ambulation/Gait Ambulation/Gait assistance: Min guard Ambulation Distance (Feet): 85 Feet Assistive device: Straight cane Gait Pattern/deviations: Step-through pattern;Wide base of support Gait velocity: decreased   General Gait Details: increased lateral shifts with gait bilaterally, no loss of balance with ambulation.    Stairs Stairs:  (pt has a ramp)          Wheelchair  Mobility    Modified Rankin (Stroke Patients Only)       Balance                                    Cognition Arousal/Alertness: Awake/alert Behavior During Therapy: WFL for tasks assessed/performed Overall Cognitive Status: Within Functional Limits for tasks assessed                      Exercises      General Comments        Pertinent Vitals/Pain Pain Assessment: Faces Faces Pain Scale: Hurts a little bit Pain Location: R shoulder with activity Pain Descriptors / Indicators: Aching;Sore Pain Intervention(s): Monitored during session;Repositioned    Home Living                      Prior Function            PT Goals (current goals can now be found in the care plan section) Progress towards PT goals: Progressing toward goals    Frequency  Min 3X/week    PT Plan      Co-evaluation             End of Session Equipment Utilized During Treatment: Gait belt Activity Tolerance: Patient tolerated treatment well Patient left: in bed;with call bell/phone within reach     Time: 1145-1210 PT Time Calculation (min) (ACUTE ONLY): 25 min  Charges:  $Gait Training: 8-22 mins $Therapeutic Activity: 8-22 mins  G Codes:      Rica Koyanagi  PTA [facility]  Acute  Rehab Pager      [phone]

## 2015-06-20 NOTE — Progress Notes (Signed)
Occupational Therapy Treatment Patient Details Name: Sarah Gilbert MRN: [id number] DOB: [birth year] Today's Date: 06/20/2015    History of present illness 69 y.o. female who presents for preoperative history and physical with a diagnosis of right shoulder rotator cuff arthropathy. S/p right reverse total shoulder arthroplasty. PMH: ESRD, anemia, atrial fibrillation, hypertension   OT comments  Pt making progress toward OT goals. Pt reports that she has not been completing elbow-hand ROM on her own, reviewed exercises and reinforced importance for completing exercises at least 3 x per day; pt verbalized understanding. Pt able to teach back UB dressing technique, sling wear schedule, RUE positioning. Reviewed UB bathing technique, edema management techniques, sling management; pt verbalized understanding. Will continue to follow pt acutely.    Follow Up Recommendations  No OT follow up;Supervision - Intermittent    Equipment Recommendations  None recommended by OT    Recommendations for Other Services      Precautions / Restrictions Precautions Precautions: Shoulder Type of Shoulder Precautions: Conservative protocol: NO PROM/AROM shoulder. AROM elbow, wrist, hand OK.  Shoulder Interventions: Shoulder sling/immobilizer;At all times Precaution Comments: reviewed precautions Required Braces or Orthoses: Sling Restrictions Weight Bearing Restrictions: Yes RUE Weight Bearing: Non weight bearing       Mobility Bed Mobility Overal bed mobility: Needs Assistance Bed Mobility: Supine to Sit;Sit to Supine     Supine to sit: HOB elevated;Min guard Sit to supine: HOB elevated;Max assist   General bed mobility comments: Not assessed at this time  Transfers Overall transfer level: Modified independent Equipment used: Straight cane Transfers: Sit to/from Stand Sit to Stand: Modified independent (Device/Increase time);Min guard         General transfer comment: Not assessed at  this time    Balance               Standing balance comment: Not assessed at this time.                   ADL                                         General ADL Comments: Pt reports she has not been completing exercises on her own, reviewed exercises and reinforced importance of completing exercises at least 3x throughout the day; pt verbalized understanding. Reviewed precautions with pt. Pt able to teach back dressing technique, UE positioning, sling wear schedule. Reviewed UB bathing technique, sling management, and edema control techniques; pt verbalized understanding.       Vision                     Perception     Praxis      Cognition   Behavior During Therapy: [facility] for tasks assessed/performed Overall Cognitive Status: Within Functional Limits for tasks assessed       Memory: Decreased recall of precautions               Extremity/Trunk Assessment               Exercises Shoulder Exercises Elbow Flexion: AROM;Right;10 reps;Supine (limited elbow ROM potentially due to edema ) Wrist Flexion: AROM;Right;10 reps;Supine Wrist Extension:  (full ROM) Digit Composite Flexion: AROM;Right;10 reps;Supine (full ROM ) Neck Flexion: AROM;5 reps;Supine Neck Lateral Flexion - Right: AROM;5 reps;Supine Donning/doffing sling/immobilizer: Maximal assistance Correct positioning of sling/immobilizer: Minimal assistance ROM for elbow, wrist and digits of operated  UE: Supervision/safety Sling wearing schedule (on at all times/off for ADL's): Supervision/safety Proper positioning of operated UE when showering: Minimal assistance Positioning of UE while sleeping: Minimal assistance   Shoulder Instructions Shoulder Instructions Donning/doffing sling/immobilizer: Maximal assistance Correct positioning of sling/immobilizer: Minimal assistance ROM for elbow, wrist and digits of operated UE: Supervision/safety Sling wearing schedule (on at  all times/off for ADL's): Supervision/safety Proper positioning of operated UE when showering: Minimal assistance Positioning of UE while sleeping: Minimal assistance     General Comments      Pertinent Vitals/ Pain       Pain Assessment: 0-10 Faces Pain Scale: Hurts little more Pain Location: R shoulder with activity Pain Descriptors / Indicators: Aching;Sore Pain Intervention(s): Limited activity within patient's tolerance;Monitored during session;Ice applied  Home Living                                          Prior Functioning/Environment              Frequency Min 2X/week     Progress Toward Goals  OT Goals(current goals can now be found in the care plan section)  Progress towards OT goals: Progressing toward goals  Acute Rehab OT Goals Patient Stated Goal: none stated  Plan Discharge plan remains appropriate    Co-evaluation                 End of Session Equipment Utilized During Treatment: Other (comment) (sling)   Activity Tolerance Patient tolerated treatment well   Patient Left in bed;with call bell/phone within reach   Nurse Communication          Time: 1320-1340 OT Time Calculation (min): 20 min  Charges: OT General Charges $OT Visit: 1 Procedure OT Treatments $Therapeutic Exercise: 8-22 mins  Binnie Kand M.S., OTR/L Pager: [phone]  06/20/2015, 1:53 PM

## 2015-06-20 NOTE — Progress Notes (Deleted)
Physical Therapy Treatment Patient Details Name: Sarah Gilbert MRN: [id number] DOB: Jan 25, 1946 Today's Date: 06/20/2015    History of Present Illness 69 y.o. female who presents for preoperative history and physical with a diagnosis of right shoulder rotator cuff arthropathy. S/p right reverse total shoulder arthroplasty. PMH: ESRD, anemia, atrial fibrillation, hypertension    PT Comments    Pt. Began with supine to sit EOB with min guard and HOB elevated; ambulated to bathroom and then ~190ft in hallway with straight cane on L; with sit to supine back in bed patient mod I with getting self into bed but required max assist to scoot up in bed  Follow Up Recommendations  No PT follow up;Supervision for mobility/OOB     Equipment Recommendations  None recommended by PT    Recommendations for Other Services       Precautions / Restrictions Precautions Precautions: Shoulder Type of Shoulder Precautions: Conservative protocol: NO PROM/AROM shoulder. AROM elbow, wrist, hand OK.  Shoulder Interventions: Shoulder sling/immobilizer;At all times Required Braces or Orthoses: Sling Restrictions Weight Bearing Restrictions: Yes RUE Weight Bearing: Non weight bearing    Mobility  Bed Mobility Overal bed mobility: Needs Assistance Bed Mobility: Supine to Sit;Sit to Supine     Supine to sit: HOB elevated;Min guard Sit to supine: HOB elevated;Max assist   General bed mobility comments: pt. required max assist for scooting up in bed with use of pad; modified I for getting herself into bed; mod I/min guard for sit to supine and use of bed rail   Transfers Overall transfer level: Modified independent Equipment used: Straight cane Transfers: Sit to/from Stand Sit to Stand: Modified independent (Device/Increase time);Min guard         General transfer comment: min guard for safety   Ambulation/Gait Ambulation/Gait assistance: Supervision;Min guard Ambulation Distance (Feet): 100  Feet Assistive device: Straight cane Gait Pattern/deviations: WFL(Within Functional Limits);Step-through pattern Gait velocity: decreased    General Gait Details: decreased velocity but no LOB; slight lateral shifts, more noticeable to R    Stairs Stairs:  (pt has a ramp)          Wheelchair Mobility    Modified Rankin (Stroke Patients Only)       Balance                                    Cognition Arousal/Alertness: Awake/alert Behavior During Therapy: WFL for tasks assessed/performed Overall Cognitive Status: Within Functional Limits for tasks assessed                      Exercises      General Comments        Pertinent Vitals/Pain Pain Assessment: Faces Faces Pain Scale: Hurts little more Pain Location: R shoulder with activity  Pain Descriptors / Indicators: Sore Pain Intervention(s): Monitored during session;Ice applied    Home Living                      Prior Function            PT Goals (current goals can now be found in the care plan section) Acute Rehab PT Goals Patient Stated Goal: none stated Time For Goal Achievement: 07/03/15 Potential to Achieve Goals: Good Progress towards PT goals: Progressing toward goals    Frequency  Min 3X/week    PT Plan      Co-evaluation  End of Session Equipment Utilized During Treatment: Gait belt Activity Tolerance: Patient tolerated treatment well Patient left: in bed;with call bell/phone within reach     Time: 1145-1210 PT Time Calculation (min) (ACUTE ONLY): 25 min  Charges:  $Gait Training: 8-22 mins $Therapeutic Activity: 8-22 mins                    G CodesDenna Haggard, [place]   06/20/2015 1:02 PM   Pager: [phone]

## 2015-06-21 NOTE — Discharge Summary (Signed)
Physician Discharge Summary  Patient ID: Sarah Gilbert MRN: [id number] DOB/AGE: 1946-07-25 69 y.o.  Admit date: 06/18/2015 Discharge date: 06/21/2015  Admission Diagnoses:  Rotator cuff tear arthropathy of right shoulder  Discharge Diagnoses:  Principal Problem:   Rotator cuff tear arthropathy of right shoulder Active Problems:   S/p reverse total shoulder arthroplasty   Past Medical History  Diagnosis Date  . Hypertension   . Hyperlipemia   . ESRD (end stage renal disease) ([place])   . Obesity   . Thyroid disease   . Anemia   . Bleeding nose     history of   . PONV (postoperative nausea and vomiting)   . History of bronchitis   . Arthritis   . Wears glasses   . Joint inflammation   . Atrial fibrillation ([place])   . Rotator cuff tear arthropathy of right shoulder 06/18/2015    Surgeries: Procedure(s): RIGHT TOTAL SHOULDER ARTHROPLASTY on 06/18/2015   Consultants (if any):    Discharged Condition: Improved  Hospital Course: Sarah Gilbert is an 69 y.o. female who was admitted 06/18/2015 with a diagnosis of Rotator cuff tear arthropathy of right shoulder and went to the operating room on 06/18/2015 and underwent the above named procedures.    She was given perioperative antibiotics:  Anti-infectives    Start     Dose/Rate Route Frequency Ordered Stop   06/18/15 2030  ceFAZolin (ANCEF) IVPB 1 g/50 mL premix     1 g 100 mL/hr over 30 Minutes Intravenous Every 6 hours 06/18/15 1918 06/19/15 0900   06/18/15 1030  clindamycin (CLEOCIN) IVPB 900 mg     900 mg 100 mL/hr over 30 Minutes Intravenous  Once 06/18/15 1026 06/18/15 1113   06/18/15 1028  clindamycin (CLEOCIN) 900 MG/50ML IVPB    Comments:  Elliott, Beth   : cabinet override      06/18/15 1028 06/18/15 1042   06/18/15 1015  vancomycin (VANCOCIN) IVPB 1000 mg/200 mL premix  Status:  Discontinued     1,000 mg 200 mL/hr over 60 Minutes Intravenous  Once 06/18/15 1007 06/18/15 1023   06/18/15 1000  ceFAZolin (ANCEF)  IVPB 2 g/50 mL premix     2 g 100 mL/hr over 30 Minutes Intravenous To [facility] 06/17/15 1105 06/18/15 1126    .  She was given sequential compression devices, early ambulation for DVT prophylaxis.  She benefited maximally from the hospital stay and there were no complications.    Recent vital signs:  Filed Vitals:   06/21/15 0857  BP:   Pulse: 86  Temp:   Resp:     Recent laboratory studies:  Lab Results  Component Value Date   HGB 11.3* 06/19/2015   HGB 12.8 06/06/2015   HGB 15.0 04/18/2015   Lab Results  Component Value Date   WBC 8.0 06/19/2015   PLT 166 06/19/2015   Lab Results  Component Value Date   INR 1.51* 06/18/2015   Lab Results  Component Value Date   NA 135 06/19/2015   K 3.0* 06/19/2015   CL 100* 06/19/2015   CO2 28 06/19/2015   BUN 6 06/19/2015   CREATININE 0.83 06/19/2015   GLUCOSE 116* 06/19/2015    Discharge Medications:     Medication List    STOP taking these medications        acetaminophen 500 MG tablet  Commonly known as:  TYLENOL     HYDROcodone-acetaminophen 5-325 MG tablet  Commonly known as:  NORCO/VICODIN  TAKE these medications        amLODipine 5 MG tablet  Commonly known as:  NORVASC  Take 5 mg by mouth daily.     atorvastatin 20 MG tablet  Commonly known as:  LIPITOR  Take 20 mg by mouth daily.     b complex-vitamin c-folic acid 0.8 MG Tabs tablet  Take 0.8 mg by mouth at bedtime.     baclofen 10 MG tablet  Commonly known as:  LIORESAL  Take 1 tablet (10 mg total) by mouth 3 (three) times daily. As needed for muscle spasm     furosemide 40 MG tablet  Commonly known as:  LASIX  Take 40 mg by mouth daily.     metoprolol 50 MG tablet  Commonly known as:  LOPRESSOR  Take 50 mg by mouth 2 (two) times daily.     mycophenolate 250 MG capsule  Commonly known as:  CELLCEPT  Take 500 mg by mouth 2 (two) times daily.     ondansetron 4 MG tablet  Commonly known as:  ZOFRAN  Take 1 tablet (4 mg  total) by mouth every 8 (eight) hours as needed for nausea or vomiting.     oxyCODONE-acetaminophen 5-325 MG tablet  Commonly known as:  ROXICET  Take 1-2 tablets by mouth every 6 (six) hours as needed for severe pain.     potassium chloride SA 20 MEQ tablet  Commonly known as:  K-DUR,KLOR-CON  Take 20 mEq by mouth daily.     ranitidine 150 MG tablet  Commonly known as:  ZANTAC  Take 150 mg by mouth daily.     rivaroxaban 20 MG Tabs tablet  Commonly known as:  XARELTO  Take 20 mg by mouth daily with supper.     sennosides-docusate sodium 8.6-50 MG tablet  Commonly known as:  SENOKOT-S  Take 2 tablets by mouth daily.     tacrolimus 1 MG capsule  Commonly known as:  PROGRAF  Take 2 mg by mouth 2 (two) times daily.        Diagnostic Studies: Dg Shoulder Right Port  Jul 14, 2015  CLINICAL DATA:  Postoperative film for right shoulder arthroplasty EXAM: PORTABLE RIGHT SHOULDER - 2+ VIEW COMPARISON:  None. FINDINGS: Right shoulder replacement is identified without malalignment. There is no acute fracture or dislocation. IMPRESSION: Right shoulder replacement without malalignment. Electronically Signed   By: Abelardo Diesel M.D.   On: 07-14-15 15:44    Disposition: 01-Home or Self Care        Follow-up Information    Follow up with Johnny Bridge, MD. Schedule an appointment as soon as possible for a visit in 2 weeks.   Specialty:  Orthopedic Surgery   Contact information:   [address] [place] [address] [phone]        Signed: Johnny Bridge 06/21/2015, 3:12 PM

## 2015-06-21 NOTE — Progress Notes (Signed)
Physical Therapy Treatment Patient Details Name: Sarah Gilbert MRN: [id number] DOB: 1946-05-28 Today's Date: 06/21/2015    History of Present Illness 69 y.o. female who presents for preoperative history and physical with a diagnosis of right shoulder rotator cuff arthropathy. S/p right reverse total shoulder arthroplasty. PMH: ESRD, anemia, atrial fibrillation, hypertension    PT Comments    Patient is making good progress with PT.  From a mobility standpoint anticipate patient will be ready for DC home with family support. Patient denies any questions or concerns following session. Will continue to follow as needed for mobility needs.      Follow Up Recommendations  No PT follow up;Supervision for mobility/OOB     Equipment Recommendations  None recommended by PT    Recommendations for Other Services       Precautions / Restrictions  Precautions Precautions: Shoulder Type of Shoulder Precautions: Conservative protocol: NO PROM/AROM shoulder. AROM elbow, wrist, hand OK.  Shoulder Interventions: Shoulder sling/immobilizer;At all times Required Braces or Orthoses: Sling Restrictions Weight Bearing Restrictions: Yes RUE Weight Bearing: Non weight bearing   Mobility  Bed Mobility    General bed mobility comments: Up in chair upon arrival, patient reports feeling confident with getting in and out of bed.   Transfers Overall transfer level: Needs assistance Equipment used: None (SPC once standing) Transfers: Sit to/from Stand Sit to Stand: Min assist Stand pivot transfers: Modified independent (Device/Increase time)       General transfer comment: min assist from lower surface (recliner)  Ambulation/Gait Ambulation/Gait assistance: Supervision Ambulation Distance (Feet): 150 Feet Assistive device: Straight cane Gait Pattern/deviations: Step-through pattern Gait velocity: decreased   General Gait Details: no loss of balance during ambulation   Stairs             Wheelchair Mobility    Modified Rankin (Stroke Patients Only)       Balance Overall balance assessment: Needs assistance Sitting-balance support: No upper extremity supported Sitting balance-Leahy Scale: Good     Standing balance support: During functional activity Standing balance-Leahy Scale: Fair Standing balance comment: using SPC                    Cognition Arousal/Alertness: Awake/alert Behavior During Therapy: WFL for tasks assessed/performed Overall Cognitive Status: Within Functional Limits for tasks assessed                      Exercises     General Comments        Pertinent Vitals/Pain Pain Assessment: 0-10 Pain Score: 6  Pain Location: Rt shoulder Pain Descriptors / Indicators: Guarding Pain Intervention(s): Limited activity within patient's tolerance;Monitored during session    Home Living                      Prior Function            PT Goals (current goals can now be found in the care plan section) Acute Rehab PT Goals Patient Stated Goal: go home PT Goal Formulation: With patient Time For Goal Achievement: 07/03/15 Potential to Achieve Goals: Good Progress towards PT goals: Progressing toward goals    Frequency  Min 3X/week    PT Plan Current plan remains appropriate    Co-evaluation             End of Session Equipment Utilized During Treatment: Gait belt Activity Tolerance: Patient tolerated treatment well Patient left: in chair;with call bell/phone within reach;with nursing/sitter in room  Time: [id number] PT Time Calculation (min) (ACUTE ONLY): 17 min  Charges:  $Gait Training: 8-22 mins                    G Codes:      Cassell Clement, PT, CSCS Pager [phone] Office [phone]  06/21/2015, 9:48 AM

## 2015-06-21 NOTE — Progress Notes (Signed)
Pt discharge education and instructions completed with pt; pt denies any questions; pt IV removed; pt discharge home with daughter to transport her home. Pt sling remain on; pt handed her prescription for percocet; pt incision dsg remains clean, dry and intact. Pt transported off unit via wheelchair with belongings to the side. Francis Gaines Aime Carreras RN.

## 2015-06-21 NOTE — Progress Notes (Signed)
Occupational Therapy Treatment Patient Details Name: Sarah Gilbert MRN: [id number] DOB: [birth year] Today's Date: 06/21/2015    History of present illness 69 y.o. female who presents for preoperative history and physical with a diagnosis of right shoulder rotator cuff arthropathy. S/p right reverse total shoulder arthroplasty. PMH: ESRD, anemia, atrial fibrillation, hypertension   OT comments  Pt. Able to return demo of UB/LB ADLS along with sling don/doff and positioning.  Reviewed all ROM and restrictions for RUE.  Pt. Eager for d/c home and reports she will have several family members available to assist  Follow Up Recommendations  Supervision - Intermittent;No OT follow up    Equipment Recommendations       Recommendations for Other Services      Precautions / Restrictions Precautions Precautions: Shoulder Type of Shoulder Precautions: Conservative protocol: NO PROM/AROM shoulder. AROM elbow, wrist, hand OK.  Shoulder Interventions: Shoulder sling/immobilizer;At all times Precaution Comments: reviewed precautions Required Braces or Orthoses: Sling Restrictions Weight Bearing Restrictions: Yes RUE Weight Bearing: Non weight bearing       Mobility Bed Mobility Overal bed mobility: Modified Independent Bed Mobility: Supine to Sit     Supine to sit: HOB elevated;Supervision Sit to supine: Modified independent (Device/Increase time);HOB elevated;Supervision   General bed mobility comments: reports she has a hospital bed at home she will be using  Transfers Overall transfer level: Modified independent   Transfers: Sit to/from Stand;Stand Pivot Transfers Sit to Stand: Modified independent (Device/Increase time) Stand pivot transfers: Modified independent (Device/Increase time)            Balance                                   ADL Overall ADL's : Needs assistance/impaired     Grooming: Wash/dry hands;Standing;Minimal assistance;Wash/dry  face;Oral care;Applying deodorant;Brushing hair   Upper Body Bathing: Minimal assitance;Standing;Cueing for compensatory techniques       Upper Body Dressing : Moderate assistance;Cueing for compensatory techniques;Standing       Toilet Transfer: Supervision/safety;Ambulation;Comfort height toilet   Toileting- Clothing Manipulation and Hygiene: Supervision/safety;Sit to/from stand       Functional mobility during ADLs: Minimal assistance General ADL Comments: making great gains with skilled OT.  able to complete UB/LB ADLS with complensatory techniques and return demo of ROM      Vision                     Perception     Praxis      Cognition                             Extremity/Trunk Assessment               Exercises Shoulder Exercises Elbow Flexion: AROM;Right;10 reps;Standing Wrist Flexion: AROM;Right;10 reps;Standing Digit Composite Flexion: AROM;Right;10 reps;Standing Neck Flexion: AROM;5 reps;Standing Donning/doffing shirt without moving shoulder: Moderate assistance Method for sponge bathing under operated UE: Moderate assistance Donning/doffing sling/immobilizer: Maximal assistance Correct positioning of sling/immobilizer: Minimal assistance ROM for elbow, wrist and digits of operated UE: Supervision/safety Sling wearing schedule (on at all times/off for ADL's): Supervision/safety   Shoulder Instructions Shoulder Instructions Donning/doffing shirt without moving shoulder: Moderate assistance Method for sponge bathing under operated UE: Moderate assistance Donning/doffing sling/immobilizer: Maximal assistance Correct positioning of sling/immobilizer: Minimal assistance ROM for elbow, wrist and digits of operated UE: Supervision/safety Sling wearing schedule (on at  all times/off for ADL's): Supervision/safety     General Comments      Pertinent Vitals/ Pain       Pain Assessment: 0-10 Pain Score: 7  Pain Location: R  shoulder Pain Descriptors / Indicators: Aching Pain Intervention(s): Monitored during session  Home Living                                          Prior Functioning/Environment              Frequency Min 2X/week     Progress Toward Goals  OT Goals(current goals can now be found in the care plan section)  Progress towards OT goals: Progressing toward goals     Plan Discharge plan remains appropriate    Co-evaluation                 End of Session     Activity Tolerance Patient tolerated treatment well   Patient Left in chair;with call bell/phone within reach   Nurse Communication          Time: [id number] OT Time Calculation (min): 31 min  Charges: OT General Charges $OT Visit: 1 Procedure OT Treatments $Self Care/Home Management : 23-37 mins  Janice Coffin, COTA/L 06/21/2015, 8:40 AM

## 2015-06-21 NOTE — Progress Notes (Signed)
IV leaking and painful. DCd IV. Pt takes PO well. PT and OT stated pt ready for DC. Waiting for MD to see before ordering new IV per pt request.

## 2015-07-01 DIAGNOSIS — M19011 Primary osteoarthritis, right shoulder: Secondary | ICD-10-CM | POA: Diagnosis not present

## 2015-07-31 DIAGNOSIS — R531 Weakness: Secondary | ICD-10-CM | POA: Diagnosis not present

## 2015-07-31 DIAGNOSIS — Z96611 Presence of right artificial shoulder joint: Secondary | ICD-10-CM | POA: Diagnosis not present

## 2015-07-31 DIAGNOSIS — M25511 Pain in right shoulder: Secondary | ICD-10-CM | POA: Diagnosis not present

## 2015-07-31 DIAGNOSIS — M25611 Stiffness of right shoulder, not elsewhere classified: Secondary | ICD-10-CM | POA: Diagnosis not present

## 2015-08-01 DIAGNOSIS — M25611 Stiffness of right shoulder, not elsewhere classified: Secondary | ICD-10-CM | POA: Diagnosis not present

## 2015-08-01 DIAGNOSIS — Z96611 Presence of right artificial shoulder joint: Secondary | ICD-10-CM | POA: Diagnosis not present

## 2015-08-01 DIAGNOSIS — M25511 Pain in right shoulder: Secondary | ICD-10-CM | POA: Diagnosis not present

## 2015-08-01 DIAGNOSIS — R531 Weakness: Secondary | ICD-10-CM | POA: Diagnosis not present

## 2015-08-05 DIAGNOSIS — M25511 Pain in right shoulder: Secondary | ICD-10-CM | POA: Diagnosis not present

## 2015-08-05 DIAGNOSIS — Z96611 Presence of right artificial shoulder joint: Secondary | ICD-10-CM | POA: Diagnosis not present

## 2015-08-05 DIAGNOSIS — M25611 Stiffness of right shoulder, not elsewhere classified: Secondary | ICD-10-CM | POA: Diagnosis not present

## 2015-08-05 DIAGNOSIS — R531 Weakness: Secondary | ICD-10-CM | POA: Diagnosis not present

## 2015-08-07 DIAGNOSIS — R531 Weakness: Secondary | ICD-10-CM | POA: Diagnosis not present

## 2015-08-07 DIAGNOSIS — M25611 Stiffness of right shoulder, not elsewhere classified: Secondary | ICD-10-CM | POA: Diagnosis not present

## 2015-08-07 DIAGNOSIS — Z96611 Presence of right artificial shoulder joint: Secondary | ICD-10-CM | POA: Diagnosis not present

## 2015-08-07 DIAGNOSIS — M25511 Pain in right shoulder: Secondary | ICD-10-CM | POA: Diagnosis not present

## 2015-08-14 DIAGNOSIS — M25611 Stiffness of right shoulder, not elsewhere classified: Secondary | ICD-10-CM | POA: Diagnosis not present

## 2015-08-14 DIAGNOSIS — R531 Weakness: Secondary | ICD-10-CM | POA: Diagnosis not present

## 2015-08-14 DIAGNOSIS — Z96611 Presence of right artificial shoulder joint: Secondary | ICD-10-CM | POA: Diagnosis not present

## 2015-08-14 DIAGNOSIS — M25511 Pain in right shoulder: Secondary | ICD-10-CM | POA: Diagnosis not present

## 2015-08-16 DIAGNOSIS — R531 Weakness: Secondary | ICD-10-CM | POA: Diagnosis not present

## 2015-08-16 DIAGNOSIS — M25611 Stiffness of right shoulder, not elsewhere classified: Secondary | ICD-10-CM | POA: Diagnosis not present

## 2015-08-16 DIAGNOSIS — Z96611 Presence of right artificial shoulder joint: Secondary | ICD-10-CM | POA: Diagnosis not present

## 2015-08-16 DIAGNOSIS — M25511 Pain in right shoulder: Secondary | ICD-10-CM | POA: Diagnosis not present

## 2015-08-20 DIAGNOSIS — R531 Weakness: Secondary | ICD-10-CM | POA: Diagnosis not present

## 2015-08-20 DIAGNOSIS — M25511 Pain in right shoulder: Secondary | ICD-10-CM | POA: Diagnosis not present

## 2015-08-20 DIAGNOSIS — M25611 Stiffness of right shoulder, not elsewhere classified: Secondary | ICD-10-CM | POA: Diagnosis not present

## 2015-08-20 DIAGNOSIS — Z96611 Presence of right artificial shoulder joint: Secondary | ICD-10-CM | POA: Diagnosis not present

## 2015-09-04 DIAGNOSIS — M25511 Pain in right shoulder: Secondary | ICD-10-CM | POA: Diagnosis not present

## 2015-09-04 DIAGNOSIS — Z96611 Presence of right artificial shoulder joint: Secondary | ICD-10-CM | POA: Diagnosis not present

## 2015-09-04 DIAGNOSIS — M25611 Stiffness of right shoulder, not elsewhere classified: Secondary | ICD-10-CM | POA: Diagnosis not present

## 2015-09-04 DIAGNOSIS — R531 Weakness: Secondary | ICD-10-CM | POA: Diagnosis not present

## 2015-09-05 DIAGNOSIS — M25611 Stiffness of right shoulder, not elsewhere classified: Secondary | ICD-10-CM | POA: Diagnosis not present

## 2015-09-05 DIAGNOSIS — R531 Weakness: Secondary | ICD-10-CM | POA: Diagnosis not present

## 2015-09-05 DIAGNOSIS — Z96611 Presence of right artificial shoulder joint: Secondary | ICD-10-CM | POA: Diagnosis not present

## 2015-09-05 DIAGNOSIS — M25511 Pain in right shoulder: Secondary | ICD-10-CM | POA: Diagnosis not present

## 2015-09-09 DIAGNOSIS — E669 Obesity, unspecified: Secondary | ICD-10-CM | POA: Diagnosis not present

## 2015-09-09 DIAGNOSIS — E785 Hyperlipidemia, unspecified: Secondary | ICD-10-CM | POA: Diagnosis not present

## 2015-09-09 DIAGNOSIS — I10 Essential (primary) hypertension: Secondary | ICD-10-CM | POA: Diagnosis not present

## 2015-09-09 DIAGNOSIS — I2510 Atherosclerotic heart disease of native coronary artery without angina pectoris: Secondary | ICD-10-CM | POA: Diagnosis not present

## 2015-09-09 DIAGNOSIS — M1990 Unspecified osteoarthritis, unspecified site: Secondary | ICD-10-CM | POA: Diagnosis not present

## 2015-09-09 DIAGNOSIS — I482 Chronic atrial fibrillation: Secondary | ICD-10-CM | POA: Diagnosis not present

## 2015-09-11 DIAGNOSIS — I10 Essential (primary) hypertension: Secondary | ICD-10-CM | POA: Diagnosis not present

## 2015-09-11 DIAGNOSIS — Z96611 Presence of right artificial shoulder joint: Secondary | ICD-10-CM | POA: Diagnosis not present

## 2015-09-11 DIAGNOSIS — M25511 Pain in right shoulder: Secondary | ICD-10-CM | POA: Diagnosis not present

## 2015-09-11 DIAGNOSIS — E785 Hyperlipidemia, unspecified: Secondary | ICD-10-CM | POA: Diagnosis not present

## 2015-09-11 DIAGNOSIS — M25611 Stiffness of right shoulder, not elsewhere classified: Secondary | ICD-10-CM | POA: Diagnosis not present

## 2015-09-11 DIAGNOSIS — R531 Weakness: Secondary | ICD-10-CM | POA: Diagnosis not present

## 2015-09-12 DIAGNOSIS — M25611 Stiffness of right shoulder, not elsewhere classified: Secondary | ICD-10-CM | POA: Diagnosis not present

## 2015-09-12 DIAGNOSIS — R531 Weakness: Secondary | ICD-10-CM | POA: Diagnosis not present

## 2015-09-12 DIAGNOSIS — M25511 Pain in right shoulder: Secondary | ICD-10-CM | POA: Diagnosis not present

## 2015-09-12 DIAGNOSIS — Z96611 Presence of right artificial shoulder joint: Secondary | ICD-10-CM | POA: Diagnosis not present

## 2015-09-17 DIAGNOSIS — M25511 Pain in right shoulder: Secondary | ICD-10-CM | POA: Diagnosis not present

## 2015-09-17 DIAGNOSIS — R531 Weakness: Secondary | ICD-10-CM | POA: Diagnosis not present

## 2015-09-17 DIAGNOSIS — M25611 Stiffness of right shoulder, not elsewhere classified: Secondary | ICD-10-CM | POA: Diagnosis not present

## 2015-09-17 DIAGNOSIS — Z96611 Presence of right artificial shoulder joint: Secondary | ICD-10-CM | POA: Diagnosis not present

## 2015-09-19 DIAGNOSIS — R531 Weakness: Secondary | ICD-10-CM | POA: Diagnosis not present

## 2015-09-19 DIAGNOSIS — Z96611 Presence of right artificial shoulder joint: Secondary | ICD-10-CM | POA: Diagnosis not present

## 2015-09-19 DIAGNOSIS — M25511 Pain in right shoulder: Secondary | ICD-10-CM | POA: Diagnosis not present

## 2015-09-19 DIAGNOSIS — M25611 Stiffness of right shoulder, not elsewhere classified: Secondary | ICD-10-CM | POA: Diagnosis not present

## 2015-09-24 DIAGNOSIS — Z96611 Presence of right artificial shoulder joint: Secondary | ICD-10-CM | POA: Diagnosis not present

## 2015-09-24 DIAGNOSIS — M25611 Stiffness of right shoulder, not elsewhere classified: Secondary | ICD-10-CM | POA: Diagnosis not present

## 2015-09-24 DIAGNOSIS — R531 Weakness: Secondary | ICD-10-CM | POA: Diagnosis not present

## 2015-09-24 DIAGNOSIS — M25511 Pain in right shoulder: Secondary | ICD-10-CM | POA: Diagnosis not present

## 2015-09-25 DIAGNOSIS — M25511 Pain in right shoulder: Secondary | ICD-10-CM | POA: Diagnosis not present

## 2015-09-25 DIAGNOSIS — Z96611 Presence of right artificial shoulder joint: Secondary | ICD-10-CM | POA: Diagnosis not present

## 2015-09-25 DIAGNOSIS — M25611 Stiffness of right shoulder, not elsewhere classified: Secondary | ICD-10-CM | POA: Diagnosis not present

## 2015-09-25 DIAGNOSIS — R531 Weakness: Secondary | ICD-10-CM | POA: Diagnosis not present

## 2015-09-30 DIAGNOSIS — R809 Proteinuria, unspecified: Secondary | ICD-10-CM | POA: Diagnosis not present

## 2015-09-30 DIAGNOSIS — N189 Chronic kidney disease, unspecified: Secondary | ICD-10-CM | POA: Diagnosis not present

## 2015-09-30 DIAGNOSIS — Z940 Kidney transplant status: Secondary | ICD-10-CM | POA: Diagnosis not present

## 2015-09-30 DIAGNOSIS — D631 Anemia in chronic kidney disease: Secondary | ICD-10-CM | POA: Diagnosis not present

## 2015-09-30 DIAGNOSIS — N2581 Secondary hyperparathyroidism of renal origin: Secondary | ICD-10-CM | POA: Diagnosis not present

## 2015-09-30 DIAGNOSIS — Z79899 Other long term (current) drug therapy: Secondary | ICD-10-CM | POA: Diagnosis not present

## 2015-09-30 DIAGNOSIS — I129 Hypertensive chronic kidney disease with stage 1 through stage 4 chronic kidney disease, or unspecified chronic kidney disease: Secondary | ICD-10-CM | POA: Diagnosis not present

## 2015-10-08 DIAGNOSIS — M25511 Pain in right shoulder: Secondary | ICD-10-CM | POA: Diagnosis not present

## 2015-10-08 DIAGNOSIS — Z96611 Presence of right artificial shoulder joint: Secondary | ICD-10-CM | POA: Diagnosis not present

## 2015-10-08 DIAGNOSIS — M25611 Stiffness of right shoulder, not elsewhere classified: Secondary | ICD-10-CM | POA: Diagnosis not present

## 2015-10-08 DIAGNOSIS — R531 Weakness: Secondary | ICD-10-CM | POA: Diagnosis not present

## 2015-10-09 DIAGNOSIS — M25611 Stiffness of right shoulder, not elsewhere classified: Secondary | ICD-10-CM | POA: Diagnosis not present

## 2015-10-09 DIAGNOSIS — R531 Weakness: Secondary | ICD-10-CM | POA: Diagnosis not present

## 2015-10-09 DIAGNOSIS — M25511 Pain in right shoulder: Secondary | ICD-10-CM | POA: Diagnosis not present

## 2015-10-09 DIAGNOSIS — Z96611 Presence of right artificial shoulder joint: Secondary | ICD-10-CM | POA: Diagnosis not present

## 2015-10-14 DIAGNOSIS — M25552 Pain in left hip: Secondary | ICD-10-CM | POA: Diagnosis not present

## 2015-10-14 DIAGNOSIS — M5442 Lumbago with sciatica, left side: Secondary | ICD-10-CM | POA: Diagnosis not present

## 2015-10-14 DIAGNOSIS — Z96611 Presence of right artificial shoulder joint: Secondary | ICD-10-CM | POA: Diagnosis not present

## 2015-11-05 DIAGNOSIS — Z940 Kidney transplant status: Secondary | ICD-10-CM | POA: Diagnosis not present

## 2015-11-05 DIAGNOSIS — N390 Urinary tract infection, site not specified: Secondary | ICD-10-CM | POA: Diagnosis not present

## 2015-11-11 DIAGNOSIS — M25552 Pain in left hip: Secondary | ICD-10-CM | POA: Diagnosis not present

## 2015-11-15 DIAGNOSIS — M545 Low back pain: Secondary | ICD-10-CM | POA: Diagnosis not present

## 2015-11-18 DIAGNOSIS — M5442 Lumbago with sciatica, left side: Secondary | ICD-10-CM | POA: Diagnosis not present

## 2015-11-21 DIAGNOSIS — M5416 Radiculopathy, lumbar region: Secondary | ICD-10-CM | POA: Diagnosis not present

## 2015-11-21 DIAGNOSIS — M545 Low back pain: Secondary | ICD-10-CM | POA: Diagnosis not present

## 2015-12-03 DIAGNOSIS — N189 Chronic kidney disease, unspecified: Secondary | ICD-10-CM | POA: Diagnosis not present

## 2015-12-03 DIAGNOSIS — N2581 Secondary hyperparathyroidism of renal origin: Secondary | ICD-10-CM | POA: Diagnosis not present

## 2015-12-03 DIAGNOSIS — Z940 Kidney transplant status: Secondary | ICD-10-CM | POA: Diagnosis not present

## 2015-12-09 DIAGNOSIS — E785 Hyperlipidemia, unspecified: Secondary | ICD-10-CM | POA: Diagnosis not present

## 2015-12-09 DIAGNOSIS — I10 Essential (primary) hypertension: Secondary | ICD-10-CM | POA: Diagnosis not present

## 2015-12-09 DIAGNOSIS — M1990 Unspecified osteoarthritis, unspecified site: Secondary | ICD-10-CM | POA: Diagnosis not present

## 2015-12-09 DIAGNOSIS — I482 Chronic atrial fibrillation: Secondary | ICD-10-CM | POA: Diagnosis not present

## 2015-12-09 DIAGNOSIS — E669 Obesity, unspecified: Secondary | ICD-10-CM | POA: Diagnosis not present

## 2015-12-09 DIAGNOSIS — I2510 Atherosclerotic heart disease of native coronary artery without angina pectoris: Secondary | ICD-10-CM | POA: Diagnosis not present

## 2015-12-31 DIAGNOSIS — Z940 Kidney transplant status: Secondary | ICD-10-CM | POA: Diagnosis not present

## 2015-12-31 DIAGNOSIS — N189 Chronic kidney disease, unspecified: Secondary | ICD-10-CM | POA: Diagnosis not present

## 2015-12-31 DIAGNOSIS — N2581 Secondary hyperparathyroidism of renal origin: Secondary | ICD-10-CM | POA: Diagnosis not present

## 2016-01-06 DIAGNOSIS — I10 Essential (primary) hypertension: Secondary | ICD-10-CM | POA: Diagnosis not present

## 2016-01-06 DIAGNOSIS — I2510 Atherosclerotic heart disease of native coronary artery without angina pectoris: Secondary | ICD-10-CM | POA: Diagnosis not present

## 2016-01-06 DIAGNOSIS — M1990 Unspecified osteoarthritis, unspecified site: Secondary | ICD-10-CM | POA: Diagnosis not present

## 2016-01-06 DIAGNOSIS — I482 Chronic atrial fibrillation: Secondary | ICD-10-CM | POA: Diagnosis not present

## 2016-01-06 DIAGNOSIS — E669 Obesity, unspecified: Secondary | ICD-10-CM | POA: Diagnosis not present

## 2016-01-06 DIAGNOSIS — E785 Hyperlipidemia, unspecified: Secondary | ICD-10-CM | POA: Diagnosis not present

## 2016-02-06 DIAGNOSIS — N189 Chronic kidney disease, unspecified: Secondary | ICD-10-CM | POA: Diagnosis not present

## 2016-02-06 DIAGNOSIS — Z940 Kidney transplant status: Secondary | ICD-10-CM | POA: Diagnosis not present

## 2016-02-20 DIAGNOSIS — L97911 Non-pressure chronic ulcer of unspecified part of right lower leg limited to breakdown of skin: Secondary | ICD-10-CM | POA: Diagnosis not present

## 2016-02-20 DIAGNOSIS — I872 Venous insufficiency (chronic) (peripheral): Secondary | ICD-10-CM | POA: Diagnosis not present

## 2016-02-20 DIAGNOSIS — L0109 Other impetigo: Secondary | ICD-10-CM | POA: Diagnosis not present

## 2016-02-20 DIAGNOSIS — I8311 Varicose veins of right lower extremity with inflammation: Secondary | ICD-10-CM | POA: Diagnosis not present

## 2016-02-20 DIAGNOSIS — I8312 Varicose veins of left lower extremity with inflammation: Secondary | ICD-10-CM | POA: Diagnosis not present

## 2016-02-26 DIAGNOSIS — I8311 Varicose veins of right lower extremity with inflammation: Secondary | ICD-10-CM | POA: Diagnosis not present

## 2016-02-26 DIAGNOSIS — I8312 Varicose veins of left lower extremity with inflammation: Secondary | ICD-10-CM | POA: Diagnosis not present

## 2016-02-26 DIAGNOSIS — I872 Venous insufficiency (chronic) (peripheral): Secondary | ICD-10-CM | POA: Diagnosis not present

## 2016-03-10 DIAGNOSIS — Z940 Kidney transplant status: Secondary | ICD-10-CM | POA: Diagnosis not present

## 2016-03-10 DIAGNOSIS — N189 Chronic kidney disease, unspecified: Secondary | ICD-10-CM | POA: Diagnosis not present

## 2016-03-10 DIAGNOSIS — Z8739 Personal history of other diseases of the musculoskeletal system and connective tissue: Secondary | ICD-10-CM | POA: Diagnosis not present

## 2016-03-18 DIAGNOSIS — R21 Rash and other nonspecific skin eruption: Secondary | ICD-10-CM | POA: Diagnosis not present

## 2016-04-10 DIAGNOSIS — N189 Chronic kidney disease, unspecified: Secondary | ICD-10-CM | POA: Diagnosis not present

## 2016-04-10 DIAGNOSIS — Z940 Kidney transplant status: Secondary | ICD-10-CM | POA: Diagnosis not present

## 2016-06-03 DIAGNOSIS — Z940 Kidney transplant status: Secondary | ICD-10-CM | POA: Diagnosis not present

## 2016-06-03 DIAGNOSIS — N189 Chronic kidney disease, unspecified: Secondary | ICD-10-CM | POA: Diagnosis not present

## 2016-07-01 DIAGNOSIS — Z940 Kidney transplant status: Secondary | ICD-10-CM | POA: Diagnosis not present

## 2016-07-01 DIAGNOSIS — N189 Chronic kidney disease, unspecified: Secondary | ICD-10-CM | POA: Diagnosis not present

## 2016-07-17 DIAGNOSIS — D631 Anemia in chronic kidney disease: Secondary | ICD-10-CM | POA: Diagnosis not present

## 2016-07-17 DIAGNOSIS — N2581 Secondary hyperparathyroidism of renal origin: Secondary | ICD-10-CM | POA: Diagnosis not present

## 2016-07-17 DIAGNOSIS — R809 Proteinuria, unspecified: Secondary | ICD-10-CM | POA: Diagnosis not present

## 2016-07-17 DIAGNOSIS — Z23 Encounter for immunization: Secondary | ICD-10-CM | POA: Diagnosis not present

## 2016-07-17 DIAGNOSIS — Z79899 Other long term (current) drug therapy: Secondary | ICD-10-CM | POA: Diagnosis not present

## 2016-07-17 DIAGNOSIS — N189 Chronic kidney disease, unspecified: Secondary | ICD-10-CM | POA: Diagnosis not present

## 2016-07-17 DIAGNOSIS — I129 Hypertensive chronic kidney disease with stage 1 through stage 4 chronic kidney disease, or unspecified chronic kidney disease: Secondary | ICD-10-CM | POA: Diagnosis not present

## 2016-07-29 DIAGNOSIS — Z940 Kidney transplant status: Secondary | ICD-10-CM | POA: Diagnosis not present

## 2016-07-29 DIAGNOSIS — N189 Chronic kidney disease, unspecified: Secondary | ICD-10-CM | POA: Diagnosis not present

## 2016-08-07 DIAGNOSIS — M1990 Unspecified osteoarthritis, unspecified site: Secondary | ICD-10-CM | POA: Diagnosis not present

## 2016-08-07 DIAGNOSIS — E669 Obesity, unspecified: Secondary | ICD-10-CM | POA: Diagnosis not present

## 2016-08-07 DIAGNOSIS — N186 End stage renal disease: Secondary | ICD-10-CM | POA: Diagnosis not present

## 2016-08-07 DIAGNOSIS — I482 Chronic atrial fibrillation: Secondary | ICD-10-CM | POA: Diagnosis not present

## 2016-08-07 DIAGNOSIS — Z940 Kidney transplant status: Secondary | ICD-10-CM | POA: Diagnosis not present

## 2016-08-07 DIAGNOSIS — I2510 Atherosclerotic heart disease of native coronary artery without angina pectoris: Secondary | ICD-10-CM | POA: Diagnosis not present

## 2016-08-07 DIAGNOSIS — E785 Hyperlipidemia, unspecified: Secondary | ICD-10-CM | POA: Diagnosis not present

## 2016-08-07 DIAGNOSIS — I120 Hypertensive chronic kidney disease with stage 5 chronic kidney disease or end stage renal disease: Secondary | ICD-10-CM | POA: Diagnosis not present

## 2016-08-26 DIAGNOSIS — Z940 Kidney transplant status: Secondary | ICD-10-CM | POA: Diagnosis not present

## 2016-08-26 DIAGNOSIS — N189 Chronic kidney disease, unspecified: Secondary | ICD-10-CM | POA: Diagnosis not present

## 2016-09-28 DIAGNOSIS — N186 End stage renal disease: Secondary | ICD-10-CM | POA: Diagnosis not present

## 2016-09-28 DIAGNOSIS — E785 Hyperlipidemia, unspecified: Secondary | ICD-10-CM | POA: Diagnosis not present

## 2016-09-28 DIAGNOSIS — J029 Acute pharyngitis, unspecified: Secondary | ICD-10-CM | POA: Diagnosis not present

## 2016-09-28 DIAGNOSIS — I120 Hypertensive chronic kidney disease with stage 5 chronic kidney disease or end stage renal disease: Secondary | ICD-10-CM | POA: Diagnosis not present

## 2016-09-28 DIAGNOSIS — Z940 Kidney transplant status: Secondary | ICD-10-CM | POA: Diagnosis not present

## 2016-09-28 DIAGNOSIS — I482 Chronic atrial fibrillation: Secondary | ICD-10-CM | POA: Diagnosis not present

## 2016-09-28 DIAGNOSIS — I2510 Atherosclerotic heart disease of native coronary artery without angina pectoris: Secondary | ICD-10-CM | POA: Diagnosis not present

## 2016-09-28 DIAGNOSIS — M1990 Unspecified osteoarthritis, unspecified site: Secondary | ICD-10-CM | POA: Diagnosis not present

## 2016-10-02 DIAGNOSIS — N189 Chronic kidney disease, unspecified: Secondary | ICD-10-CM | POA: Diagnosis not present

## 2016-10-02 DIAGNOSIS — Z940 Kidney transplant status: Secondary | ICD-10-CM | POA: Diagnosis not present

## 2016-10-07 DIAGNOSIS — E876 Hypokalemia: Secondary | ICD-10-CM | POA: Diagnosis not present

## 2016-11-27 DIAGNOSIS — N189 Chronic kidney disease, unspecified: Secondary | ICD-10-CM | POA: Diagnosis not present

## 2016-11-27 DIAGNOSIS — Z940 Kidney transplant status: Secondary | ICD-10-CM | POA: Diagnosis not present

## 2016-11-27 DIAGNOSIS — Z111 Encounter for screening for respiratory tuberculosis: Secondary | ICD-10-CM | POA: Diagnosis not present

## 2016-12-09 DIAGNOSIS — R809 Proteinuria, unspecified: Secondary | ICD-10-CM | POA: Diagnosis not present

## 2016-12-09 DIAGNOSIS — Z79899 Other long term (current) drug therapy: Secondary | ICD-10-CM | POA: Diagnosis not present

## 2016-12-09 DIAGNOSIS — D631 Anemia in chronic kidney disease: Secondary | ICD-10-CM | POA: Diagnosis not present

## 2016-12-09 DIAGNOSIS — N189 Chronic kidney disease, unspecified: Secondary | ICD-10-CM | POA: Diagnosis not present

## 2016-12-09 DIAGNOSIS — I129 Hypertensive chronic kidney disease with stage 1 through stage 4 chronic kidney disease, or unspecified chronic kidney disease: Secondary | ICD-10-CM | POA: Diagnosis not present

## 2016-12-09 DIAGNOSIS — N2581 Secondary hyperparathyroidism of renal origin: Secondary | ICD-10-CM | POA: Diagnosis not present

## 2016-12-11 DIAGNOSIS — R21 Rash and other nonspecific skin eruption: Secondary | ICD-10-CM | POA: Diagnosis not present

## 2016-12-11 DIAGNOSIS — Z940 Kidney transplant status: Secondary | ICD-10-CM | POA: Diagnosis not present

## 2016-12-11 DIAGNOSIS — I129 Hypertensive chronic kidney disease with stage 1 through stage 4 chronic kidney disease, or unspecified chronic kidney disease: Secondary | ICD-10-CM | POA: Diagnosis not present

## 2016-12-11 DIAGNOSIS — R946 Abnormal results of thyroid function studies: Secondary | ICD-10-CM | POA: Diagnosis not present

## 2016-12-11 DIAGNOSIS — E782 Mixed hyperlipidemia: Secondary | ICD-10-CM | POA: Diagnosis not present

## 2017-01-01 DIAGNOSIS — E785 Hyperlipidemia, unspecified: Secondary | ICD-10-CM | POA: Diagnosis not present

## 2017-01-01 DIAGNOSIS — I10 Essential (primary) hypertension: Secondary | ICD-10-CM | POA: Diagnosis not present

## 2017-01-01 DIAGNOSIS — I2510 Atherosclerotic heart disease of native coronary artery without angina pectoris: Secondary | ICD-10-CM | POA: Diagnosis not present

## 2017-01-01 DIAGNOSIS — I482 Chronic atrial fibrillation: Secondary | ICD-10-CM | POA: Diagnosis not present

## 2017-01-01 DIAGNOSIS — M1990 Unspecified osteoarthritis, unspecified site: Secondary | ICD-10-CM | POA: Diagnosis not present

## 2017-02-26 DIAGNOSIS — N2581 Secondary hyperparathyroidism of renal origin: Secondary | ICD-10-CM | POA: Diagnosis not present

## 2017-02-26 DIAGNOSIS — R809 Proteinuria, unspecified: Secondary | ICD-10-CM | POA: Diagnosis not present

## 2017-02-26 DIAGNOSIS — D631 Anemia in chronic kidney disease: Secondary | ICD-10-CM | POA: Diagnosis not present

## 2017-02-26 DIAGNOSIS — Z940 Kidney transplant status: Secondary | ICD-10-CM | POA: Diagnosis not present

## 2017-03-29 DIAGNOSIS — Z940 Kidney transplant status: Secondary | ICD-10-CM | POA: Diagnosis not present

## 2017-04-08 DIAGNOSIS — M1712 Unilateral primary osteoarthritis, left knee: Secondary | ICD-10-CM | POA: Diagnosis not present

## 2017-04-08 DIAGNOSIS — M545 Low back pain: Secondary | ICD-10-CM | POA: Diagnosis not present

## 2017-04-08 DIAGNOSIS — M25562 Pain in left knee: Secondary | ICD-10-CM | POA: Diagnosis not present

## 2017-04-08 DIAGNOSIS — M1711 Unilateral primary osteoarthritis, right knee: Secondary | ICD-10-CM | POA: Diagnosis not present

## 2017-04-08 DIAGNOSIS — M25561 Pain in right knee: Secondary | ICD-10-CM | POA: Diagnosis not present

## 2017-04-08 DIAGNOSIS — M5137 Other intervertebral disc degeneration, lumbosacral region: Secondary | ICD-10-CM | POA: Diagnosis not present

## 2017-06-16 DIAGNOSIS — Z6837 Body mass index (BMI) 37.0-37.9, adult: Secondary | ICD-10-CM | POA: Diagnosis not present

## 2017-06-16 DIAGNOSIS — E785 Hyperlipidemia, unspecified: Secondary | ICD-10-CM | POA: Diagnosis not present

## 2017-06-16 DIAGNOSIS — I482 Chronic atrial fibrillation: Secondary | ICD-10-CM | POA: Diagnosis not present

## 2017-06-16 DIAGNOSIS — E6609 Other obesity due to excess calories: Secondary | ICD-10-CM | POA: Diagnosis not present

## 2017-06-16 DIAGNOSIS — I10 Essential (primary) hypertension: Secondary | ICD-10-CM | POA: Diagnosis not present

## 2017-06-16 DIAGNOSIS — I2510 Atherosclerotic heart disease of native coronary artery without angina pectoris: Secondary | ICD-10-CM | POA: Diagnosis not present

## 2017-06-16 DIAGNOSIS — M1990 Unspecified osteoarthritis, unspecified site: Secondary | ICD-10-CM | POA: Diagnosis not present

## 2017-06-29 DIAGNOSIS — Z23 Encounter for immunization: Secondary | ICD-10-CM | POA: Diagnosis not present

## 2017-06-29 DIAGNOSIS — Z940 Kidney transplant status: Secondary | ICD-10-CM | POA: Diagnosis not present

## 2017-06-29 DIAGNOSIS — E785 Hyperlipidemia, unspecified: Secondary | ICD-10-CM | POA: Diagnosis not present

## 2017-09-24 DIAGNOSIS — E782 Mixed hyperlipidemia: Secondary | ICD-10-CM | POA: Diagnosis not present

## 2017-09-24 DIAGNOSIS — I129 Hypertensive chronic kidney disease with stage 1 through stage 4 chronic kidney disease, or unspecified chronic kidney disease: Secondary | ICD-10-CM | POA: Diagnosis not present

## 2017-09-24 DIAGNOSIS — E2839 Other primary ovarian failure: Secondary | ICD-10-CM | POA: Diagnosis not present

## 2017-09-24 DIAGNOSIS — N182 Chronic kidney disease, stage 2 (mild): Secondary | ICD-10-CM | POA: Diagnosis not present

## 2017-09-24 DIAGNOSIS — Z0000 Encounter for general adult medical examination without abnormal findings: Secondary | ICD-10-CM | POA: Diagnosis not present

## 2017-09-24 DIAGNOSIS — Z940 Kidney transplant status: Secondary | ICD-10-CM | POA: Diagnosis not present

## 2017-09-24 DIAGNOSIS — R300 Dysuria: Secondary | ICD-10-CM | POA: Diagnosis not present

## 2017-09-24 DIAGNOSIS — R21 Rash and other nonspecific skin eruption: Secondary | ICD-10-CM | POA: Diagnosis not present

## 2017-09-24 DIAGNOSIS — M25511 Pain in right shoulder: Secondary | ICD-10-CM | POA: Diagnosis not present

## 2017-09-30 DIAGNOSIS — Z940 Kidney transplant status: Secondary | ICD-10-CM | POA: Diagnosis not present

## 2017-10-01 ENCOUNTER — Other Ambulatory Visit: Payer: Self-pay | Admitting: Nurse Practitioner

## 2017-10-01 DIAGNOSIS — Z6836 Body mass index (BMI) 36.0-36.9, adult: Secondary | ICD-10-CM | POA: Diagnosis not present

## 2017-10-01 DIAGNOSIS — R229 Localized swelling, mass and lump, unspecified: Secondary | ICD-10-CM

## 2017-10-01 DIAGNOSIS — I4891 Unspecified atrial fibrillation: Secondary | ICD-10-CM | POA: Diagnosis not present

## 2017-10-01 DIAGNOSIS — J069 Acute upper respiratory infection, unspecified: Secondary | ICD-10-CM | POA: Diagnosis not present

## 2017-10-01 DIAGNOSIS — E669 Obesity, unspecified: Secondary | ICD-10-CM | POA: Diagnosis not present

## 2017-10-01 DIAGNOSIS — I10 Essential (primary) hypertension: Secondary | ICD-10-CM | POA: Diagnosis not present

## 2017-10-01 DIAGNOSIS — E785 Hyperlipidemia, unspecified: Secondary | ICD-10-CM | POA: Diagnosis not present

## 2017-10-01 DIAGNOSIS — R609 Edema, unspecified: Secondary | ICD-10-CM | POA: Diagnosis not present

## 2017-10-01 DIAGNOSIS — Z1231 Encounter for screening mammogram for malignant neoplasm of breast: Secondary | ICD-10-CM

## 2017-10-01 DIAGNOSIS — M1711 Unilateral primary osteoarthritis, right knee: Secondary | ICD-10-CM | POA: Diagnosis not present

## 2017-10-01 DIAGNOSIS — K219 Gastro-esophageal reflux disease without esophagitis: Secondary | ICD-10-CM | POA: Diagnosis not present

## 2017-10-04 ENCOUNTER — Other Ambulatory Visit: Payer: Self-pay | Admitting: Internal Medicine

## 2017-10-04 DIAGNOSIS — E2839 Other primary ovarian failure: Secondary | ICD-10-CM

## 2017-10-07 ENCOUNTER — Other Ambulatory Visit: Payer: Medicare PPO

## 2017-10-11 ENCOUNTER — Ambulatory Visit
Admission: RE | Admit: 2017-10-11 | Discharge: 2017-10-11 | Disposition: A | Discharge status: Home / Self Care | Payer: Medicare PPO | Source: Ambulatory Visit | Attending: Nurse Practitioner | Admitting: Nurse Practitioner

## 2017-10-11 DIAGNOSIS — R229 Localized swelling, mass and lump, unspecified: Secondary | ICD-10-CM

## 2017-10-11 DIAGNOSIS — R221 Localized swelling, mass and lump, neck: Secondary | ICD-10-CM | POA: Diagnosis not present

## 2017-10-18 DIAGNOSIS — E785 Hyperlipidemia, unspecified: Secondary | ICD-10-CM | POA: Diagnosis not present

## 2017-10-18 DIAGNOSIS — Z940 Kidney transplant status: Secondary | ICD-10-CM | POA: Diagnosis not present

## 2017-10-18 DIAGNOSIS — M1990 Unspecified osteoarthritis, unspecified site: Secondary | ICD-10-CM | POA: Diagnosis not present

## 2017-10-18 DIAGNOSIS — I10 Essential (primary) hypertension: Secondary | ICD-10-CM | POA: Diagnosis not present

## 2017-10-18 DIAGNOSIS — I482 Chronic atrial fibrillation: Secondary | ICD-10-CM | POA: Diagnosis not present

## 2017-10-26 ENCOUNTER — Ambulatory Visit: Payer: Medicare PPO

## 2017-11-01 ENCOUNTER — Ambulatory Visit
Admission: RE | Admit: 2017-11-01 | Discharge: 2017-11-01 | Disposition: A | Discharge status: Home / Self Care | Payer: Medicare PPO | Source: Ambulatory Visit | Attending: Internal Medicine | Admitting: Internal Medicine

## 2017-11-01 ENCOUNTER — Ambulatory Visit
Admission: RE | Admit: 2017-11-01 | Discharge: 2017-11-01 | Disposition: A | Discharge status: Home / Self Care | Payer: Medicare PPO | Source: Ambulatory Visit | Attending: Nurse Practitioner | Admitting: Nurse Practitioner

## 2017-11-01 DIAGNOSIS — Z1231 Encounter for screening mammogram for malignant neoplasm of breast: Secondary | ICD-10-CM | POA: Diagnosis not present

## 2017-11-01 DIAGNOSIS — Z13820 Encounter for screening for osteoporosis: Secondary | ICD-10-CM | POA: Diagnosis not present

## 2017-11-01 DIAGNOSIS — Z780 Asymptomatic menopausal state: Secondary | ICD-10-CM | POA: Diagnosis not present

## 2017-11-01 DIAGNOSIS — E2839 Other primary ovarian failure: Secondary | ICD-10-CM

## 2017-11-08 DIAGNOSIS — M180 Bilateral primary osteoarthritis of first carpometacarpal joints: Secondary | ICD-10-CM | POA: Diagnosis not present

## 2017-11-08 DIAGNOSIS — M1711 Unilateral primary osteoarthritis, right knee: Secondary | ICD-10-CM | POA: Diagnosis not present

## 2017-11-08 DIAGNOSIS — M7742 Metatarsalgia, left foot: Secondary | ICD-10-CM | POA: Diagnosis not present

## 2017-11-15 DIAGNOSIS — M79671 Pain in right foot: Secondary | ICD-10-CM | POA: Diagnosis not present

## 2017-11-15 DIAGNOSIS — M19071 Primary osteoarthritis, right ankle and foot: Secondary | ICD-10-CM | POA: Diagnosis not present

## 2017-11-15 DIAGNOSIS — G5761 Lesion of plantar nerve, right lower limb: Secondary | ICD-10-CM | POA: Diagnosis not present

## 2017-11-15 DIAGNOSIS — M12271 Villonodular synovitis (pigmented), right ankle and foot: Secondary | ICD-10-CM | POA: Diagnosis not present

## 2017-12-13 DIAGNOSIS — I129 Hypertensive chronic kidney disease with stage 1 through stage 4 chronic kidney disease, or unspecified chronic kidney disease: Secondary | ICD-10-CM | POA: Diagnosis not present

## 2017-12-13 DIAGNOSIS — R809 Proteinuria, unspecified: Secondary | ICD-10-CM | POA: Diagnosis not present

## 2017-12-13 DIAGNOSIS — Z940 Kidney transplant status: Secondary | ICD-10-CM | POA: Diagnosis not present

## 2017-12-13 DIAGNOSIS — D631 Anemia in chronic kidney disease: Secondary | ICD-10-CM | POA: Diagnosis not present

## 2017-12-13 DIAGNOSIS — E876 Hypokalemia: Secondary | ICD-10-CM | POA: Diagnosis not present

## 2017-12-13 DIAGNOSIS — N2581 Secondary hyperparathyroidism of renal origin: Secondary | ICD-10-CM | POA: Diagnosis not present

## 2017-12-13 DIAGNOSIS — Z79899 Other long term (current) drug therapy: Secondary | ICD-10-CM | POA: Diagnosis not present

## 2017-12-17 DIAGNOSIS — N390 Urinary tract infection, site not specified: Secondary | ICD-10-CM | POA: Diagnosis not present

## 2018-01-03 DIAGNOSIS — I129 Hypertensive chronic kidney disease with stage 1 through stage 4 chronic kidney disease, or unspecified chronic kidney disease: Secondary | ICD-10-CM | POA: Diagnosis not present

## 2018-01-03 DIAGNOSIS — E785 Hyperlipidemia, unspecified: Secondary | ICD-10-CM | POA: Diagnosis not present

## 2018-01-03 DIAGNOSIS — Z940 Kidney transplant status: Secondary | ICD-10-CM | POA: Diagnosis not present

## 2018-01-03 DIAGNOSIS — N2581 Secondary hyperparathyroidism of renal origin: Secondary | ICD-10-CM | POA: Diagnosis not present

## 2018-01-12 ENCOUNTER — Other Ambulatory Visit: Payer: Self-pay | Admitting: Pharmacist

## 2018-01-12 NOTE — Patient Outreach (Signed)
[facility]) Care Management  01/12/2018  SABAH ZUCCO 1946/05/20 [id number]   Incoming call from [name] in response to the [facility] Medication Adherence Campaign. Speak with patient. HIPAA identifiers verified and verbal consent received.  Ms. Nolasco reports that she takes her atorvastatin once daily as directed. Reports that she misses a dose of her atorvastatin maybe one or twice a month because she falls asleep before taking it. Counsel patient on the importance of medication adherence and that due to its long long half-life, she can take her atorvastatin in the morning, rather than prior to bedtime to improve her adherence. Patient states that she would find this change in administration time to be helpful. Patient reports that she uses a pillbox to aid with her adherence.  Ms. Archibeque denies any further medication questions/concerns at this time. Patient thanks me for the phone call. Provide patient with my phone number.  Harlow Asa, PharmD, [facility] [phone]

## 2018-01-17 DIAGNOSIS — I10 Essential (primary) hypertension: Secondary | ICD-10-CM | POA: Diagnosis not present

## 2018-01-17 DIAGNOSIS — M1990 Unspecified osteoarthritis, unspecified site: Secondary | ICD-10-CM | POA: Diagnosis not present

## 2018-01-17 DIAGNOSIS — E785 Hyperlipidemia, unspecified: Secondary | ICD-10-CM | POA: Diagnosis not present

## 2018-01-17 DIAGNOSIS — I482 Chronic atrial fibrillation: Secondary | ICD-10-CM | POA: Diagnosis not present

## 2018-01-20 DIAGNOSIS — R159 Full incontinence of feces: Secondary | ICD-10-CM | POA: Diagnosis not present

## 2018-01-20 DIAGNOSIS — Z1211 Encounter for screening for malignant neoplasm of colon: Secondary | ICD-10-CM | POA: Diagnosis not present

## 2018-01-20 DIAGNOSIS — R152 Fecal urgency: Secondary | ICD-10-CM | POA: Diagnosis not present

## 2018-01-31 DIAGNOSIS — Z1212 Encounter for screening for malignant neoplasm of rectum: Secondary | ICD-10-CM | POA: Diagnosis not present

## 2018-01-31 DIAGNOSIS — Z1211 Encounter for screening for malignant neoplasm of colon: Secondary | ICD-10-CM | POA: Diagnosis not present

## 2018-03-25 DIAGNOSIS — N182 Chronic kidney disease, stage 2 (mild): Secondary | ICD-10-CM | POA: Diagnosis not present

## 2018-03-25 DIAGNOSIS — H2513 Age-related nuclear cataract, bilateral: Secondary | ICD-10-CM | POA: Diagnosis not present

## 2018-03-25 DIAGNOSIS — H25013 Cortical age-related cataract, bilateral: Secondary | ICD-10-CM | POA: Diagnosis not present

## 2018-03-25 DIAGNOSIS — N760 Acute vaginitis: Secondary | ICD-10-CM | POA: Diagnosis not present

## 2018-03-25 DIAGNOSIS — H18413 Arcus senilis, bilateral: Secondary | ICD-10-CM | POA: Diagnosis not present

## 2018-03-25 DIAGNOSIS — H2511 Age-related nuclear cataract, right eye: Secondary | ICD-10-CM | POA: Diagnosis not present

## 2018-03-25 DIAGNOSIS — R21 Rash and other nonspecific skin eruption: Secondary | ICD-10-CM | POA: Diagnosis not present

## 2018-03-25 DIAGNOSIS — H25043 Posterior subcapsular polar age-related cataract, bilateral: Secondary | ICD-10-CM | POA: Diagnosis not present

## 2018-03-25 DIAGNOSIS — H02831 Dermatochalasis of right upper eyelid: Secondary | ICD-10-CM | POA: Diagnosis not present

## 2018-03-25 DIAGNOSIS — I129 Hypertensive chronic kidney disease with stage 1 through stage 4 chronic kidney disease, or unspecified chronic kidney disease: Secondary | ICD-10-CM | POA: Diagnosis not present

## 2018-03-25 DIAGNOSIS — E782 Mixed hyperlipidemia: Secondary | ICD-10-CM | POA: Diagnosis not present

## 2018-04-01 DIAGNOSIS — R809 Proteinuria, unspecified: Secondary | ICD-10-CM | POA: Diagnosis not present

## 2018-04-01 DIAGNOSIS — Z940 Kidney transplant status: Secondary | ICD-10-CM | POA: Diagnosis not present

## 2018-04-01 DIAGNOSIS — E876 Hypokalemia: Secondary | ICD-10-CM | POA: Diagnosis not present

## 2018-04-25 DIAGNOSIS — Z6836 Body mass index (BMI) 36.0-36.9, adult: Secondary | ICD-10-CM | POA: Diagnosis not present

## 2018-04-25 DIAGNOSIS — M1990 Unspecified osteoarthritis, unspecified site: Secondary | ICD-10-CM | POA: Diagnosis not present

## 2018-04-25 DIAGNOSIS — E6609 Other obesity due to excess calories: Secondary | ICD-10-CM | POA: Diagnosis not present

## 2018-04-25 DIAGNOSIS — I10 Essential (primary) hypertension: Secondary | ICD-10-CM | POA: Diagnosis not present

## 2018-04-25 DIAGNOSIS — Z992 Dependence on renal dialysis: Secondary | ICD-10-CM | POA: Diagnosis not present

## 2018-04-25 DIAGNOSIS — E785 Hyperlipidemia, unspecified: Secondary | ICD-10-CM | POA: Diagnosis not present

## 2018-04-25 DIAGNOSIS — N186 End stage renal disease: Secondary | ICD-10-CM | POA: Diagnosis not present

## 2018-04-25 DIAGNOSIS — I482 Chronic atrial fibrillation: Secondary | ICD-10-CM | POA: Diagnosis not present

## 2018-05-16 DIAGNOSIS — H18231 Secondary corneal edema, right eye: Secondary | ICD-10-CM | POA: Diagnosis not present

## 2018-05-16 DIAGNOSIS — H52221 Regular astigmatism, right eye: Secondary | ICD-10-CM | POA: Diagnosis not present

## 2018-05-16 DIAGNOSIS — Z961 Presence of intraocular lens: Secondary | ICD-10-CM | POA: Diagnosis not present

## 2018-05-16 DIAGNOSIS — H2511 Age-related nuclear cataract, right eye: Secondary | ICD-10-CM | POA: Diagnosis not present

## 2018-05-25 DIAGNOSIS — Z23 Encounter for immunization: Secondary | ICD-10-CM | POA: Diagnosis not present

## 2018-06-08 DIAGNOSIS — L400 Psoriasis vulgaris: Secondary | ICD-10-CM | POA: Diagnosis not present

## 2018-06-08 DIAGNOSIS — L820 Inflamed seborrheic keratosis: Secondary | ICD-10-CM | POA: Diagnosis not present

## 2018-07-25 DIAGNOSIS — I10 Essential (primary) hypertension: Secondary | ICD-10-CM | POA: Diagnosis not present

## 2018-07-25 DIAGNOSIS — Z6837 Body mass index (BMI) 37.0-37.9, adult: Secondary | ICD-10-CM | POA: Diagnosis not present

## 2018-07-25 DIAGNOSIS — N186 End stage renal disease: Secondary | ICD-10-CM | POA: Diagnosis not present

## 2018-07-25 DIAGNOSIS — E785 Hyperlipidemia, unspecified: Secondary | ICD-10-CM | POA: Diagnosis not present

## 2018-07-25 DIAGNOSIS — Z992 Dependence on renal dialysis: Secondary | ICD-10-CM | POA: Diagnosis not present

## 2018-07-25 DIAGNOSIS — I482 Chronic atrial fibrillation, unspecified: Secondary | ICD-10-CM | POA: Diagnosis not present

## 2018-07-25 DIAGNOSIS — E6609 Other obesity due to excess calories: Secondary | ICD-10-CM | POA: Diagnosis not present

## 2018-08-18 DIAGNOSIS — D631 Anemia in chronic kidney disease: Secondary | ICD-10-CM | POA: Diagnosis not present

## 2018-08-18 DIAGNOSIS — I129 Hypertensive chronic kidney disease with stage 1 through stage 4 chronic kidney disease, or unspecified chronic kidney disease: Secondary | ICD-10-CM | POA: Diagnosis not present

## 2018-08-18 DIAGNOSIS — Z940 Kidney transplant status: Secondary | ICD-10-CM | POA: Diagnosis not present

## 2018-08-18 DIAGNOSIS — D899 Disorder involving the immune mechanism, unspecified: Secondary | ICD-10-CM | POA: Diagnosis not present

## 2018-08-18 DIAGNOSIS — N189 Chronic kidney disease, unspecified: Secondary | ICD-10-CM | POA: Diagnosis not present

## 2018-08-18 DIAGNOSIS — Z8739 Personal history of other diseases of the musculoskeletal system and connective tissue: Secondary | ICD-10-CM | POA: Diagnosis not present

## 2018-08-18 DIAGNOSIS — R809 Proteinuria, unspecified: Secondary | ICD-10-CM | POA: Diagnosis not present

## 2018-08-19 DIAGNOSIS — Z940 Kidney transplant status: Secondary | ICD-10-CM | POA: Diagnosis not present

## 2018-08-19 DIAGNOSIS — E785 Hyperlipidemia, unspecified: Secondary | ICD-10-CM | POA: Diagnosis not present

## 2018-08-19 DIAGNOSIS — I129 Hypertensive chronic kidney disease with stage 1 through stage 4 chronic kidney disease, or unspecified chronic kidney disease: Secondary | ICD-10-CM | POA: Diagnosis not present

## 2018-08-22 ENCOUNTER — Telehealth: Payer: Self-pay | Admitting: Internal Medicine

## 2018-08-22 NOTE — Telephone Encounter (Signed)
I spoke with the patient and scheduled her AWV-S that's due in Feb 2020.  I rescheduled her office visit from 09/30/2018 to 09/29/2018 to allow same day AWV/OV. VDM (DD)

## 2018-09-01 ENCOUNTER — Ambulatory Visit (INDEPENDENT_AMBULATORY_CARE_PROVIDER_SITE_OTHER): Payer: Medicare PPO

## 2018-09-01 ENCOUNTER — Encounter: Payer: Self-pay | Admitting: Podiatry

## 2018-09-01 ENCOUNTER — Ambulatory Visit: Payer: Medicare PPO | Admitting: Podiatry

## 2018-09-01 ENCOUNTER — Other Ambulatory Visit: Payer: Self-pay | Admitting: Podiatry

## 2018-09-01 VITALS — BP 134/87 | HR 57

## 2018-09-01 DIAGNOSIS — M79671 Pain in right foot: Secondary | ICD-10-CM

## 2018-09-01 DIAGNOSIS — M722 Plantar fascial fibromatosis: Secondary | ICD-10-CM | POA: Diagnosis not present

## 2018-09-01 DIAGNOSIS — M79674 Pain in right toe(s): Secondary | ICD-10-CM | POA: Diagnosis not present

## 2018-09-01 DIAGNOSIS — B351 Tinea unguium: Secondary | ICD-10-CM

## 2018-09-01 DIAGNOSIS — M79675 Pain in left toe(s): Secondary | ICD-10-CM

## 2018-09-01 MED ORDER — TRIAMCINOLONE ACETONIDE 10 MG/ML IJ SUSP
10.0000 mg | Freq: Once | INTRAMUSCULAR | Status: AC
  Administered 2018-09-01: 10 mg

## 2018-09-07 NOTE — Progress Notes (Signed)
Subjective:   Patient ID: Sarah Gilbert, female   DOB: 73 y.o.   MRN: [id number]   HPI Patient presents stating she is had a lot of pain in her right heel and also is concerned about nail disease 1-5 both feet stating they are yellow thick and she cannot cut them.  Patient states is been going on a long time and the pain in the heel has gradually gotten worse recently.  Patient does not smoke likes to be active and has had transplant surgery of her kidneys   Review of Systems  All other systems reviewed and are negative.       Objective:  Physical Exam Vitals signs and nursing note reviewed.  Constitutional:      Appearance: She is well-developed.  Pulmonary:     Effort: Pulmonary effort is normal.  Musculoskeletal: Normal range of motion.  Skin:    General: Skin is warm.  Neurological:     Mental Status: She is alert.     Neurovascular status found to be intact muscle strength was adequate range of motion within normal limits with patient found to have exquisite discomfort plantar aspect right heel at the insertional point tendon calcaneus with inflammation fluid buildup and is noted to have discoloration hallux nails bilateral localized in nature with all nails being thickened and yellowed and moderately painful     Assessment:  Acute plantar fasciitis right with mycotic nail infection     Plan:  H&P educated on both conditions and will get a focus on the heel today.  I injected the plantar fascia 3 mg Kenalog 5 mg Xylocaine and instructed on supportive shoe therapy and I went ahead debrided nailbeds 1-5 both feet with no iatrogenic bleeding and this will be done as needed

## 2018-09-29 ENCOUNTER — Ambulatory Visit (INDEPENDENT_AMBULATORY_CARE_PROVIDER_SITE_OTHER): Payer: Medicare PPO

## 2018-09-29 ENCOUNTER — Encounter: Payer: Self-pay | Admitting: Nurse Practitioner

## 2018-09-29 ENCOUNTER — Ambulatory Visit: Payer: Medicare PPO | Admitting: Nurse Practitioner

## 2018-09-29 VITALS — BP 140/90 | HR 75 | Temp 98.0°F | Ht 63.4 in | Wt 224.2 lb

## 2018-09-29 VITALS — BP 140/90 | HR 75 | Temp 98.0°F | Ht 63.4 in | Wt 224.0 lb

## 2018-09-29 DIAGNOSIS — Z0000 Encounter for general adult medical examination without abnormal findings: Secondary | ICD-10-CM | POA: Diagnosis not present

## 2018-09-29 DIAGNOSIS — E782 Mixed hyperlipidemia: Secondary | ICD-10-CM | POA: Diagnosis not present

## 2018-09-29 DIAGNOSIS — Z1159 Encounter for screening for other viral diseases: Secondary | ICD-10-CM | POA: Diagnosis not present

## 2018-09-29 DIAGNOSIS — I10 Essential (primary) hypertension: Secondary | ICD-10-CM

## 2018-09-29 DIAGNOSIS — Z23 Encounter for immunization: Secondary | ICD-10-CM

## 2018-09-29 DIAGNOSIS — R319 Hematuria, unspecified: Secondary | ICD-10-CM

## 2018-09-29 DIAGNOSIS — Z0001 Encounter for general adult medical examination with abnormal findings: Secondary | ICD-10-CM | POA: Diagnosis not present

## 2018-09-29 DIAGNOSIS — D171 Benign lipomatous neoplasm of skin and subcutaneous tissue of trunk: Secondary | ICD-10-CM | POA: Insufficient documentation

## 2018-09-29 LAB — POCT URINALYSIS DIPSTICK
Bilirubin, UA: NEGATIVE
Glucose, UA: NEGATIVE
Ketones, UA: NEGATIVE
Nitrite, UA: NEGATIVE
Protein, UA: NEGATIVE
Spec Grav, UA: 1.015 (ref 1.010–1.025)
Urobilinogen, UA: 0.2 E.U./dL
pH, UA: 7.5 (ref 5.0–8.0)

## 2018-09-29 LAB — POCT UA - MICROALBUMIN
Creatinine, POC: 100 mg/dL
Microalbumin Ur, POC: 80 mg/L

## 2018-09-29 NOTE — Patient Instructions (Signed)
Hypertension Hypertension, commonly called high blood pressure, is when the force of blood pumping through the arteries is too strong. The arteries are the blood vessels that carry blood from the heart throughout the body. Hypertension forces the heart to work harder to pump blood and may cause arteries to become narrow or stiff. Having untreated or uncontrolled hypertension can cause heart attacks, strokes, kidney disease, and other problems. A blood pressure reading consists of a higher number over a lower number. Ideally, your blood pressure should be below 120/80. The first ("top") number is called the systolic pressure. It is a measure of the pressure in your arteries as your heart beats. The second ("bottom") number is called the diastolic pressure. It is a measure of the pressure in your arteries as the heart relaxes. What are the causes? The cause of this condition is not known. What increases the risk? Some risk factors for high blood pressure are under your control. Others are not. Factors you can change  Smoking.  Having type 2 diabetes mellitus, high cholesterol, or both.  Not getting enough exercise or physical activity.  Being overweight.  Having too much fat, sugar, calories, or salt (sodium) in your diet.  Drinking too much alcohol. Factors that are difficult or impossible to change  Having chronic kidney disease.  Having a family history of high blood pressure.  Age. Risk increases with age.  Race. You may be at higher risk if you are African-American.  Gender. Men are at higher risk than women before age 45. After age 65, women are at higher risk than men.  Having obstructive sleep apnea.  Stress. What are the signs or symptoms? Extremely high blood pressure (hypertensive crisis) may cause:  Headache.  Anxiety.  Shortness of breath.  Nosebleed.  Nausea and vomiting.  Severe chest pain.  Jerky movements you cannot control (seizures). How is this  diagnosed? This condition is diagnosed by measuring your blood pressure while you are seated, with your arm resting on a surface. The cuff of the blood pressure monitor will be placed directly against the skin of your upper arm at the level of your heart. It should be measured at least twice using the same arm. Certain conditions can cause a difference in blood pressure between your right and left arms. Certain factors can cause blood pressure readings to be lower or higher than normal (elevated) for a short period of time:  When your blood pressure is higher when you are in a health care provider's office than when you are at home, this is called white coat hypertension. Most people with this condition do not need medicines.  When your blood pressure is higher at home than when you are in a health care provider's office, this is called masked hypertension. Most people with this condition may need medicines to control blood pressure. If you have a high blood pressure reading during one visit or you have normal blood pressure with other risk factors:  You may be asked to return on a different day to have your blood pressure checked again.  You may be asked to monitor your blood pressure at home for 1 week or longer. If you are diagnosed with hypertension, you may have other blood or imaging tests to help your health care provider understand your overall risk for other conditions. How is this treated? This condition is treated by making healthy lifestyle changes, such as eating healthy foods, exercising more, and reducing your alcohol intake. Your health care provider   may prescribe medicine if lifestyle changes are not enough to get your blood pressure under control, and if:  Your systolic blood pressure is above 130.  Your diastolic blood pressure is above 80. Your personal target blood pressure may vary depending on your medical conditions, your age, and other factors. Follow these instructions  at home: Eating and drinking   Eat a diet that is high in fiber and potassium, and low in sodium, added sugar, and fat. An example eating plan is called the DASH (Dietary Approaches to Stop Hypertension) diet. To eat this way: ? Eat plenty of fresh fruits and vegetables. Try to fill half of your plate at each meal with fruits and vegetables. ? Eat whole grains, such as whole wheat pasta, brown rice, or whole grain bread. Fill about one quarter of your plate with whole grains. ? Eat or drink low-fat dairy products, such as skim milk or low-fat yogurt. ? Avoid fatty cuts of meat, processed or cured meats, and poultry with skin. Fill about one quarter of your plate with lean proteins, such as fish, chicken without skin, beans, eggs, and tofu. ? Avoid premade and processed foods. These tend to be higher in sodium, added sugar, and fat.  Reduce your daily sodium intake. Most people with hypertension should eat less than 1,500 mg of sodium a day.  Limit alcohol intake to no more than 1 drink a day for nonpregnant women and 2 drinks a day for men. One drink equals 12 oz of beer, 5 oz of wine, or 1 oz of hard liquor. Lifestyle   Work with your health care provider to maintain a healthy body weight or to lose weight. Ask what an ideal weight is for you.  Get at least 30 minutes of exercise that causes your heart to beat faster (aerobic exercise) most days of the week. Activities may include walking, swimming, or biking.  Include exercise to strengthen your muscles (resistance exercise), such as pilates or lifting weights, as part of your weekly exercise routine. Try to do these types of exercises for 30 minutes at least 3 days a week.  Do not use any products that contain nicotine or tobacco, such as cigarettes and e-cigarettes. If you need help quitting, ask your health care provider.  Monitor your blood pressure at home as told by your health care provider.  Keep all follow-up visits as told by  your health care provider. This is important. Medicines  Take over-the-counter and prescription medicines only as told by your health care provider. Follow directions carefully. Blood pressure medicines must be taken as prescribed.  Do not skip doses of blood pressure medicine. Doing this puts you at risk for problems and can make the medicine less effective.  Ask your health care provider about side effects or reactions to medicines that you should watch for. Contact a health care provider if:  You think you are having a reaction to a medicine you are taking.  You have headaches that keep coming back (recurring).  You feel dizzy.  You have swelling in your ankles.  You have trouble with your vision. Get help right away if:  You develop a severe headache or confusion.  You have unusual weakness or numbness.  You feel faint.  You have severe pain in your chest or abdomen.  You vomit repeatedly.  You have trouble breathing. Summary  Hypertension is when the force of blood pumping through your arteries is too strong. If this condition is not controlled, it  may put you at risk for serious complications.  Your personal target blood pressure may vary depending on your medical conditions, your age, and other factors. For most people, a normal blood pressure is less than 120/80.  Hypertension is treated with lifestyle changes, medicines, or a combination of both. Lifestyle changes include weight loss, eating a healthy, low-sodium diet, exercising more, and limiting alcohol. This information is not intended to replace advice given to you by your health care provider. Make sure you discuss any questions you have with your health care provider. Document Released: 08/03/2005 Document Revised: 07/01/2016 Document Reviewed: 07/01/2016 Elsevier Interactive Patient Education  2019 [place] Maintenance, Female Adopting a healthy lifestyle and getting preventive care can go a  long way to promote health and wellness. Talk with your health care provider about what schedule of regular examinations is right for you. This is a good chance for you to check in with your provider about disease prevention and staying healthy. In between checkups, there are plenty of things you can do on your own. Experts have done a lot of research about which lifestyle changes and preventive measures are most likely to keep you healthy. Ask your health care provider for more information. Weight and diet Eat a healthy diet  Be sure to include plenty of vegetables, fruits, low-fat dairy products, and lean protein.  Do not eat a lot of foods high in solid fats, added sugars, or salt.  Get regular exercise. This is one of the most important things you can do for your health. ? Most adults should exercise for at least 150 minutes each week. The exercise should increase your heart rate and make you sweat (moderate-intensity exercise). ? Most adults should also do strengthening exercises at least twice a week. This is in addition to the moderate-intensity exercise. Maintain a healthy weight  Body mass index (BMI) is a measurement that can be used to identify possible weight problems. It estimates body fat based on height and weight. Your health care provider can help determine your BMI and help you achieve or maintain a healthy weight.  For females 48 years of age and older: ? A BMI below 18.5 is considered underweight. ? A BMI of 18.5 to 24.9 is normal. ? A BMI of 25 to 29.9 is considered overweight. ? A BMI of 30 and above is considered obese. Watch levels of cholesterol and blood lipids  You should start having your blood tested for lipids and cholesterol at 71 years of age, then have this test every 5 years.  You may need to have your cholesterol levels checked more often if: ? Your lipid or cholesterol levels are high. ? You are older than 71 years of age. ? You are at high risk for  heart disease. Cancer screening Lung Cancer  Lung cancer screening is recommended for adults 31-74 years old who are at high risk for lung cancer because of a history of smoking.  A yearly low-dose CT scan of the lungs is recommended for people who: ? Currently smoke. ? Have quit within the past 15 years. ? Have at least a 30-pack-year history of smoking. A pack year is smoking an average of one pack of cigarettes a day for 1 year.  Yearly screening should continue until it has been 15 years since you quit.  Yearly screening should stop if you develop a health problem that would prevent you from having lung cancer treatment. Breast Cancer  Practice breast self-awareness. This means  understanding how your breasts normally appear and feel.  It also means doing regular breast self-exams. Let your health care provider know about any changes, no matter how small.  If you are in your 20s or 30s, you should have a clinical breast exam (CBE) by a health care provider every 1-3 years as part of a regular health exam.  If you are 25 or older, have a CBE every year. Also consider having a breast X-ray (mammogram) every year.  If you have a family history of breast cancer, talk to your health care provider about genetic screening.  If you are at high risk for breast cancer, talk to your health care provider about having an MRI and a mammogram every year.  Breast cancer gene (BRCA) assessment is recommended for women who have family members with BRCA-related cancers. BRCA-related cancers include: ? Breast. ? Ovarian. ? Tubal. ? Peritoneal cancers.  Results of the assessment will determine the need for genetic counseling and BRCA1 and BRCA2 testing. Cervical Cancer Your health care provider may recommend that you be screened regularly for cancer of the pelvic organs (ovaries, uterus, and vagina). This screening involves a pelvic examination, including checking for microscopic changes to the  surface of your cervix (Pap test). You may be encouraged to have this screening done every 3 years, beginning at age 28.  For women ages 61-65, health care providers may recommend pelvic exams and Pap testing every 3 years, or they may recommend the Pap and pelvic exam, combined with testing for human papilloma virus (HPV), every 5 years. Some types of HPV increase your risk of cervical cancer. Testing for HPV may also be done on women of any age with unclear Pap test results.  Other health care providers may not recommend any screening for nonpregnant women who are considered low risk for pelvic cancer and who do not have symptoms. Ask your health care provider if a screening pelvic exam is right for you.  If you have had past treatment for cervical cancer or a condition that could lead to cancer, you need Pap tests and screening for cancer for at least 20 years after your treatment. If Pap tests have been discontinued, your risk factors (such as having a new sexual partner) need to be reassessed to determine if screening should resume. Some women have medical problems that increase the chance of getting cervical cancer. In these cases, your health care provider may recommend more frequent screening and Pap tests. Colorectal Cancer  This type of cancer can be detected and often prevented.  Routine colorectal cancer screening usually begins at 71 years of age and continues through 71 years of age.  Your health care provider may recommend screening at an earlier age if you have risk factors for colon cancer.  Your health care provider may also recommend using home test kits to check for hidden blood in the stool.  A small camera at the end of a tube can be used to examine your colon directly (sigmoidoscopy or colonoscopy). This is done to check for the earliest forms of colorectal cancer.  Routine screening usually begins at age 21.  Direct examination of the colon should be repeated every 5-10  years through 71 years of age. However, you may need to be screened more often if early forms of precancerous polyps or small growths are found. Skin Cancer  Check your skin from head to toe regularly.  Tell your health care provider about any new moles or changes in  moles, especially if there is a change in a mole's shape or color.  Also tell your health care provider if you have a mole that is larger than the size of a pencil eraser.  Always use sunscreen. Apply sunscreen liberally and repeatedly throughout the day.  Protect yourself by wearing long sleeves, pants, a wide-brimmed hat, and sunglasses whenever you are outside. Heart disease, diabetes, and high blood pressure  High blood pressure causes heart disease and increases the risk of stroke. High blood pressure is more likely to develop in: ? People who have blood pressure in the high end of the normal range (130-139/85-89 mm Hg). ? People who are overweight or obese. ? People who are African American.  If you are 64-29 years of age, have your blood pressure checked every 3-5 years. If you are 23 years of age or older, have your blood pressure checked every year. You should have your blood pressure measured twice-once when you are at a hospital or clinic, and once when you are not at a hospital or clinic. Record the average of the two measurements. To check your blood pressure when you are not at a hospital or clinic, you can use: ? An automated blood pressure machine at a pharmacy. ? A home blood pressure monitor.  If you are between 79 years and 58 years old, ask your health care provider if you should take aspirin to prevent strokes.  Have regular diabetes screenings. This involves taking a blood sample to check your fasting blood sugar level. ? If you are at a normal weight and have a low risk for diabetes, have this test once every three years after 71 years of age. ? If you are overweight and have a high risk for diabetes,  consider being tested at a younger age or more often. Preventing infection Hepatitis B  If you have a higher risk for hepatitis B, you should be screened for this virus. You are considered at high risk for hepatitis B if: ? You were born in a country where hepatitis B is common. Ask your health care provider which countries are considered high risk. ? Your parents were born in a high-risk country, and you have not been immunized against hepatitis B (hepatitis B vaccine). ? You have HIV or AIDS. ? You use needles to inject street drugs. ? You live with someone who has hepatitis B. ? You have had sex with someone who has hepatitis B. ? You get hemodialysis treatment. ? You take certain medicines for conditions, including cancer, organ transplantation, and autoimmune conditions. Hepatitis C  Blood testing is recommended for: ? Everyone born from [birth year] through 1963. ? Anyone with known risk factors for hepatitis C. Sexually transmitted infections (STIs)  You should be screened for sexually transmitted infections (STIs) including gonorrhea and chlamydia if: ? You are sexually active and are younger than 71 years of age. ? You are older than 71 years of age and your health care provider tells you that you are at risk for this type of infection. ? Your sexual activity has changed since you were last screened and you are at an increased risk for chlamydia or gonorrhea. Ask your health care provider if you are at risk.  If you do not have HIV, but are at risk, it may be recommended that you take a prescription medicine daily to prevent HIV infection. This is called pre-exposure prophylaxis (PrEP). You are considered at risk if: ? You are sexually active and do  not regularly use condoms or know the HIV status of your partner(s). ? You take drugs by injection. ? You are sexually active with a partner who has HIV. Talk with your health care provider about whether you are at high risk of being  infected with HIV. If you choose to begin PrEP, you should first be tested for HIV. You should then be tested every 3 months for as long as you are taking PrEP. Pregnancy  If you are premenopausal and you may become pregnant, ask your health care provider about preconception counseling.  If you may become pregnant, take 400 to 800 micrograms (mcg) of folic acid every day.  If you want to prevent pregnancy, talk to your health care provider about birth control (contraception). Osteoporosis and menopause  Osteoporosis is a disease in which the bones lose minerals and strength with aging. This can result in serious bone fractures. Your risk for osteoporosis can be identified using a bone density scan.  If you are 26 years of age or older, or if you are at risk for osteoporosis and fractures, ask your health care provider if you should be screened.  Ask your health care provider whether you should take a calcium or vitamin D supplement to lower your risk for osteoporosis.  Menopause may have certain physical symptoms and risks.  Hormone replacement therapy may reduce some of these symptoms and risks. Talk to your health care provider about whether hormone replacement therapy is right for you. Follow these instructions at home:  Schedule regular health, dental, and eye exams.  Stay current with your immunizations.  Do not use any tobacco products including cigarettes, chewing tobacco, or electronic cigarettes.  If you are pregnant, do not drink alcohol.  If you are breastfeeding, limit how much and how often you drink alcohol.  Limit alcohol intake to no more than 1 drink per day for nonpregnant women. One drink equals 12 ounces of beer, 5 ounces of wine, or 1 ounces of hard liquor.  Do not use street drugs.  Do not share needles.  Ask your health care provider for help if you need support or information about quitting drugs.  Tell your health care provider if you often feel  depressed.  Tell your health care provider if you have ever been abused or do not feel safe at home. This information is not intended to replace advice given to you by your health care provider. Make sure you discuss any questions you have with your health care provider. Document Released: 02/16/2011 Document Revised: 01/09/2016 Document Reviewed: 05/07/2015 Elsevier Interactive Patient Education  2019 [employer].

## 2018-09-29 NOTE — Progress Notes (Signed)
Subjective:     Patient ID: Sarah Gilbert , female    DOB: 18-Jan-1946 , 73 y.o.   MRN: [id number]   Chief Complaint  Patient presents with  . Hypertension   The patient states she uses status post hysterectomy for birth control. Last LMP was No LMP recorded. Patient has had a hysterectomy.. Negative for Dysmenorrhea and Negative for Menorrhagia Mammogram last done 11/01/2017. Negative for: breast discharge, breast lump(s), breast pain and breast self exam.  Pertinent negatives include abnormal bleeding (hematology), anxiety, decreased libido, depression, difficulty falling sleep, dyspareunia, history of infertility, nocturia, sexual dysfunction, sleep disturbances, urinary incontinence, urinary urgency, vaginal discharge and vaginal itching. Diet regular.The patient states her exercise level is  minimal.    The patient's tobacco use is:  Social History   Tobacco Use  Smoking Status Never Smoker  Smokeless Tobacco Never Used    She has been exposed to passive smoke. The patient's alcohol use is:  Social History   Substance and Sexual Activity  Alcohol Use No  . Additional information:  HPI  Seen Dr. Terrence Dupont in recent months.    Hypertension  This is a chronic problem. The current episode started more than 1 year ago. The problem is controlled. Pertinent negatives include no anxiety, chest pain or palpitations. There are no associated agents to hypertension. There are no known risk factors for coronary artery disease.     Past Medical History:  Diagnosis Date  . Anemia   . Arthritis   . Atrial fibrillation ([place])   . Bleeding nose    history of   . ESRD (end stage renal disease) ([place])   . History of bronchitis   . Hyperlipemia   . Hypertension   . Joint inflammation   . Obesity   . PONV (postoperative nausea and vomiting)   . Rotator cuff tear arthropathy of right shoulder 06/18/2015  . Thyroid disease   . Wears glasses      Family History  Problem Relation Age of  Onset  . Dementia Mother   . Cancer Father      Current Outpatient Medications:  .  amLODipine (NORVASC) 10 MG tablet, , Disp: , Rfl:  .  amLODipine (NORVASC) 5 MG tablet, Take 5 mg by mouth daily., Disp: , Rfl:  .  atorvastatin (LIPITOR) 20 MG tablet, Take 20 mg by mouth daily. , Disp: , Rfl:  .  b complex-vitamin c-folic acid (NEPHRO-VITE) 0.8 MG TABS, Take 0.8 mg by mouth at bedtime., Disp: , Rfl:  .  baclofen (LIORESAL) 10 MG tablet, Take 1 tablet (10 mg total) by mouth 3 (three) times daily. As needed for muscle spasm (Patient not taking: Reported on 09/29/2018), Disp: 50 tablet, Rfl: 0 .  furosemide (LASIX) 40 MG tablet, Take 40 mg by mouth daily. , Disp: , Rfl:  .  hydrocortisone 2.5 % lotion, APP EXT AA  BID PRN, Disp: , Rfl:  .  magnesium gluconate (MAGONATE) 500 MG tablet, Take 500 mg by mouth 2 (two) times daily., Disp: , Rfl:  .  metoprolol (LOPRESSOR) 50 MG tablet, Take 50 mg by mouth 2 (two) times daily., Disp: , Rfl:  .  mycophenolate (CELLCEPT) 250 MG capsule, Take 500 mg by mouth 2 (two) times daily., Disp: , Rfl:  .  ondansetron (ZOFRAN) 4 MG tablet, Take 1 tablet (4 mg total) by mouth every 8 (eight) hours as needed for nausea or vomiting. (Patient not taking: Reported on 09/29/2018), Disp: 30 tablet, Rfl: 0 .  oxyCODONE-acetaminophen (ROXICET) 5-325 MG tablet, Take 1-2 tablets by mouth every 6 (six) hours as needed for severe pain. (Patient not taking: Reported on 09/29/2018), Disp: 50 tablet, Rfl: 0 .  pantoprazole (PROTONIX) 40 MG tablet, , Disp: , Rfl:  .  potassium chloride SA (K-DUR,KLOR-CON) 20 MEQ tablet, Take 20 mEq by mouth daily. , Disp: , Rfl:  .  ranitidine (ZANTAC) 150 MG tablet, Take 150 mg by mouth daily., Disp: , Rfl:  .  rivaroxaban (XARELTO) 20 MG TABS tablet, Take 20 mg by mouth daily with supper., Disp: , Rfl:  .  sennosides-docusate sodium (SENOKOT-S) 8.6-50 MG tablet, Take 2 tablets by mouth daily. (Patient not taking: Reported on 09/29/2018), Disp: 30  tablet, Rfl: 1 .  tacrolimus (PROGRAF) 0.5 MG capsule, , Disp: , Rfl:  .  tacrolimus (PROGRAF) 1 MG capsule, Take 2 mg by mouth 2 (two) times daily. , Disp: , Rfl:    Allergies  Allergen Reactions  . Vancomycin Hives, Itching, Rash and Other (See Comments)    Blistering, swelling, and peeling of feet.  Pt denies any similar reactions around face or neck.  . Iohexol      Desc: iodine contrast/per dialysis center, Onset Date: [id number]      Review of Systems  Constitutional: Negative.   HENT: Negative.   Eyes: Negative.   Respiratory: Negative.   Cardiovascular: Negative.  Negative for chest pain, palpitations and leg swelling.  Gastrointestinal: Negative.   Endocrine: Negative.  Negative for polydipsia, polyphagia and polyuria.  Genitourinary: Negative.   Musculoskeletal: Negative.   Skin: Negative.        Left posterior back with soft mass.    Allergic/Immunologic: Negative.   Neurological: Negative.   Hematological: Negative.   Psychiatric/Behavioral: Negative.      Today's Vitals   09/29/18 0855  BP: 140/90  Pulse: 75  Temp: 98 F (36.7 C)  TempSrc: Oral  SpO2: 95%  Weight: 224 lb (101.6 kg)  Height: 5' 3.4" (1.61 m)  PainSc: 0-No pain   Body mass index is 39.18 kg/m.   Objective:  Physical Exam Vitals signs reviewed.  Constitutional:      Appearance: Normal appearance. She is well-developed.  HENT:     Head: Normocephalic and atraumatic.     Right Ear: Hearing, tympanic membrane, ear canal and external ear normal.     Left Ear: Hearing, tympanic membrane, ear canal and external ear normal.     Nose: Nose normal.     Mouth/Throat:     Mouth: Mucous membranes are moist.  Eyes:     General: Lids are normal.     Conjunctiva/sclera: Conjunctivae normal.     Pupils: Pupils are equal, round, and reactive to light.     Funduscopic exam:    Right eye: No papilledema.        Left eye: No papilledema.  Neck:     Musculoskeletal: Full passive range of motion  without pain, normal range of motion and neck supple.     Thyroid: No thyroid mass.     Vascular: No carotid bruit.  Cardiovascular:     Rate and Rhythm: Normal rate and regular rhythm.     Pulses: Normal pulses.     Heart sounds: Normal heart sounds. No murmur.  Pulmonary:     Effort: Pulmonary effort is normal.     Breath sounds: Normal breath sounds.  Chest:     Chest wall: No mass.     Breasts:  Right: Normal. No swelling, mass, nipple discharge or tenderness.        Left: Normal. No swelling, mass, nipple discharge or tenderness.  Abdominal:     General: Abdomen is flat. Bowel sounds are normal.     Palpations: Abdomen is soft.  Musculoskeletal: Normal range of motion.        General: No swelling.     Right lower leg: No edema.     Left lower leg: No edema.  Skin:    General: Skin is warm and dry.     Capillary Refill: Capillary refill takes less than 2 seconds.          Comments: Soft mass to posterior left back, mild tenderness  Neurological:     General: No focal deficit present.     Mental Status: She is alert and oriented to person, place, and time.     Cranial Nerves: No cranial nerve deficit.     Sensory: No sensory deficit.  Psychiatric:        Mood and Affect: Mood normal.        Behavior: Behavior normal.        Thought Content: Thought content normal.        Judgment: Judgment normal.         Assessment And Plan:   1. Health maintenance examination . Behavior modifications discussed and diet history reviewed.   . Pt will continue to exercise regularly and modify diet with low GI, plant based foods and decrease intake of processed foods.  . Recommend intake of daily multivitamin, Vitamin D, and calcium.  . Recommend for preventive screenings, as well as recommend immunizations that include influenza, TDAP  2. Encounter for hepatitis C screening test for low risk patient  Will check for Hepatitis C screening due to being born between the years  61-1963 - Hepatitis C antibody  3. Hypertension, essential . B/P is controlled.  . CMP ordered to check renal function.  . The importance of regular exercise and dietary modification was stressed to the patient.  . Stressed importance of losing ten percent of her body weight to help with B/P control.  . The weight loss would help with decreasing cardiac and cancer risk as well.  - EKG 12-Lead - POCT Urinalysis Dipstick (81002) - POCT UA - Microalbumin - CMP14 + Anion Gap  4. Mixed hyperlipidemia  Chronic, controlled  Continue with current medications - Lipid panel - CBC no Diff  5. Hematuria, unspecified type  Urine with moderate blood and large leukocytes  Will send for urine culture  No urinary complaints  - Culture, Urine  6. Lipoma of back  Left posterior back with large soft mass  I have advised her if this begins to cause her problems with ADLs or pain we can refer to a surgeon for removal.      [name], FNP

## 2018-09-29 NOTE — Patient Instructions (Signed)
Sarah Gilbert , Thank you for taking time to come for your Medicare Wellness Visit. I appreciate your ongoing commitment to your health goals. Please review the following plan we discussed and let me know if I can assist you in the future.   Screening recommendations/referrals: Colonoscopy: 08/2011 Mammogram: 10/2017 Bone Density: 10/2017 Recommended yearly ophthalmology/optometry visit for glaucoma screening and checkup Recommended yearly dental visit for hygiene and checkup  Vaccinations: Influenza vaccine: due Pneumococcal vaccine: 05/2008 Tdap vaccine: 05/2014 Shingles vaccine: discussed    Advanced directives: Advance directive discussed with you today. I have provided a copy for you to complete at home and have notarized. Once this is complete please bring a copy in to our office so we can scan it into your chart.   Conditions/risks identified: Obesity  Next appointment: 09/29/2018   Preventive Care 65 Years and Older, Female Preventive care refers to lifestyle choices and visits with your health care provider that can promote health and wellness. What does preventive care include?  A yearly physical exam. This is also called an annual well check.  Dental exams once or twice a year.  Routine eye exams. Ask your health care provider how often you should have your eyes checked.  Personal lifestyle choices, including:  Daily care of your teeth and gums.  Regular physical activity.  Eating a healthy diet.  Avoiding tobacco and drug use.  Limiting alcohol use.  Practicing safe sex.  Taking low-dose aspirin every day.  Taking vitamin and mineral supplements as recommended by your health care provider. What happens during an annual well check? The services and screenings done by your health care provider during your annual well check will depend on your age, overall health, lifestyle risk factors, and family history of disease. Counseling  Your health care provider may  ask you questions about your:  Alcohol use.  Tobacco use.  Drug use.  Emotional well-being.  Home and relationship well-being.  Sexual activity.  Eating habits.  History of falls.  Memory and ability to understand (cognition).  Work and work Statistician.  Reproductive health. Screening  You may have the following tests or measurements:  Height, weight, and BMI.  Blood pressure.  Lipid and cholesterol levels. These may be checked every 5 years, or more frequently if you are over 56 years old.  Skin check.  Lung cancer screening. You may have this screening every year starting at age 11 if you have a 30-pack-year history of smoking and currently smoke or have quit within the past 15 years.  Fecal occult blood test (FOBT) of the stool. You may have this test every year starting at age 43.  Flexible sigmoidoscopy or colonoscopy. You may have a sigmoidoscopy every 5 years or a colonoscopy every 10 years starting at age 83.  Hepatitis C blood test.  Hepatitis B blood test.  Sexually transmitted disease (STD) testing.  Diabetes screening. This is done by checking your blood sugar (glucose) after you have not eaten for a while (fasting). You may have this done every 1-3 years.  Bone density scan. This is done to screen for osteoporosis. You may have this done starting at age 73.  Mammogram. This may be done every 1-2 years. Talk to your health care provider about how often you should have regular mammograms. Talk with your health care provider about your test results, treatment options, and if necessary, the need for more tests. Vaccines  Your health care provider may recommend certain vaccines, such as:  Influenza vaccine.  This is recommended every year.  Tetanus, diphtheria, and acellular pertussis (Tdap, Td) vaccine. You may need a Td booster every 10 years.  Zoster vaccine. You may need this after age 5.  Pneumococcal 13-valent conjugate (PCV13) vaccine. One  dose is recommended after age 70.  Pneumococcal polysaccharide (PPSV23) vaccine. One dose is recommended after age 62. Talk to your health care provider about which screenings and vaccines you need and how often you need them. This information is not intended to replace advice given to you by your health care provider. Make sure you discuss any questions you have with your health care provider. Document Released: 08/30/2015 Document Revised: 04/22/2016 Document Reviewed: 06/04/2015 Elsevier Interactive Patient Education  2017 [place] Prevention in the Home Falls can cause injuries. They can happen to people of all ages. There are many things you can do to make your home safe and to help prevent falls. What can I do on the outside of my home?  Regularly fix the edges of walkways and driveways and fix any cracks.  Remove anything that might make you trip as you walk through a door, such as a raised step or threshold.  Trim any bushes or trees on the path to your home.  Use bright outdoor lighting.  Clear any walking paths of anything that might make someone trip, such as rocks or tools.  Regularly check to see if handrails are loose or broken. Make sure that both sides of any steps have handrails.  Any raised decks and porches should have guardrails on the edges.  Have any leaves, snow, or ice cleared regularly.  Use sand or salt on walking paths during winter.  Clean up any spills in your garage right away. This includes oil or grease spills. What can I do in the bathroom?  Use night lights.  Install grab bars by the toilet and in the tub and shower. Do not use towel bars as grab bars.  Use non-skid mats or decals in the tub or shower.  If you need to sit down in the shower, use a plastic, non-slip stool.  Keep the floor dry. Clean up any water that spills on the floor as soon as it happens.  Remove soap buildup in the tub or shower regularly.  Attach bath  mats securely with double-sided non-slip rug tape.  Do not have throw rugs and other things on the floor that can make you trip. What can I do in the bedroom?  Use night lights.  Make sure that you have a light by your bed that is easy to reach.  Do not use any sheets or blankets that are too big for your bed. They should not hang down onto the floor.  Have a firm chair that has side arms. You can use this for support while you get dressed.  Do not have throw rugs and other things on the floor that can make you trip. What can I do in the kitchen?  Clean up any spills right away.  Avoid walking on wet floors.  Keep items that you use a lot in easy-to-reach places.  If you need to reach something above you, use a strong step stool that has a grab bar.  Keep electrical cords out of the way.  Do not use floor polish or wax that makes floors slippery. If you must use wax, use non-skid floor wax.  Do not have throw rugs and other things on the floor that can make  you trip. What can I do with my stairs?  Do not leave any items on the stairs.  Make sure that there are handrails on both sides of the stairs and use them. Fix handrails that are broken or loose. Make sure that handrails are as long as the stairways.  Check any carpeting to make sure that it is firmly attached to the stairs. Fix any carpet that is loose or worn.  Avoid having throw rugs at the top or bottom of the stairs. If you do have throw rugs, attach them to the floor with carpet tape.  Make sure that you have a light switch at the top of the stairs and the bottom of the stairs. If you do not have them, ask someone to add them for you. What else can I do to help prevent falls?  Wear shoes that:  Do not have high heels.  Have rubber bottoms.  Are comfortable and fit you well.  Are closed at the toe. Do not wear sandals.  If you use a stepladder:  Make sure that it is fully opened. Do not climb a closed  stepladder.  Make sure that both sides of the stepladder are locked into place.  Ask someone to hold it for you, if possible.  Clearly mark and make sure that you can see:  Any grab bars or handrails.  First and last steps.  Where the edge of each step is.  Use tools that help you move around (mobility aids) if they are needed. These include:  Canes.  Walkers.  Scooters.  Crutches.  Turn on the lights when you go into a dark area. Replace any light bulbs as soon as they burn out.  Set up your furniture so you have a clear path. Avoid moving your furniture around.  If any of your floors are uneven, fix them.  If there are any pets around you, be aware of where they are.  Review your medicines with your doctor. Some medicines can make you feel dizzy. This can increase your chance of falling. Ask your doctor what other things that you can do to help prevent falls. This information is not intended to replace advice given to you by your health care provider. Make sure you discuss any questions you have with your health care provider. Document Released: 05/30/2009 Document Revised: 01/09/2016 Document Reviewed: 09/07/2014 Elsevier Interactive Patient Education  2017 [employer].

## 2018-09-29 NOTE — Progress Notes (Signed)
Subjective:   Sarah Gilbert is a 73 y.o. female who presents for Medicare Annual (Subsequent) preventive examination.  Review of Systems:  n/a Cardiac Risk Factors include: advanced age (>23men, >2 women);hypertension;obesity (BMI >30kg/m2);sedentary lifestyle     Objective:     Vitals: BP 140/90 (BP Location: Left Arm, Patient Position: Sitting)   Pulse 75   Temp 98 F (36.7 C) (Oral)   Ht 5' 3.4" (1.61 m)   Wt 224 lb 3.2 oz (101.7 kg)   SpO2 95%   BMI 39.22 kg/m   Body mass index is 39.22 kg/m.  Advanced Directives 09/29/2018 06/06/2015 04/18/2015 03/25/2015  Does Patient Have a Medical Advance Directive? No No No Yes  Type of Advance Directive - - - [place]  Would patient like information on creating a medical advance directive? Yes (MAU/Ambulatory/Procedural Areas - Information given) Yes - Educational materials given No - patient declined information -    Tobacco Social History   Tobacco Use  Smoking Status Never Smoker  Smokeless Tobacco Never Used     Counseling given: Not Answered   Clinical Intake:  Pre-visit preparation completed: Yes  Pain : No/denies pain Pain Score: 0-No pain     Nutritional Status: BMI > 30  Obese Nutritional Risks: Nausea/ vomitting/ diarrhea(occassional diarrhea with some foods) Diabetes: No  How often do you need to have someone help you when you read instructions, pamphlets, or other written materials from your doctor or pharmacy?: 1 - Never What is the last grade level you completed in school?: 12th grade     Information entered by :: NAllen LPN  Past Medical History:  Diagnosis Date  . Anemia   . Arthritis   . Atrial fibrillation ([place])   . Bleeding nose    history of   . ESRD (end stage renal disease) ([place])   . History of bronchitis   . Hyperlipemia   . Hypertension   . Joint inflammation   . Obesity   . PONV (postoperative nausea and vomiting)   . Rotator cuff tear arthropathy of  right shoulder 06/18/2015  . Thyroid disease   . Wears glasses    Past Surgical History:  Procedure Laterality Date  . ABDOMINAL HYSTERECTOMY    . APPENDECTOMY    . CARDIAC CATHETERIZATION N/A 04/18/2015   Procedure: Left Heart Cath and Coronary Angiography;  Surgeon: Charolette Forward, MD;  Location: [facility];  Service: Cardiovascular;  Laterality: N/A;  . CHOLECYSTECTOMY    . COLONOSCOPY    . KIDNEY TRANSPLANT     june 2011-[facility]  . TONSILLECTOMY    . TOTAL SHOULDER ARTHROPLASTY Right 06/18/2015   Procedure: RIGHT TOTAL SHOULDER ARTHROPLASTY;  Surgeon: Marchia Bond, MD;  Location: [facility];  Service: Orthopedics;  Laterality: Right;   Family History  Problem Relation Age of Onset  . Dementia Mother   . Cancer Father    Social History   Socioeconomic History  . Marital status: Single    Spouse name: Not on file  . Number of children: Not on file  . Years of education: Not on file  . Highest education level: Not on file  Occupational History  . Occupation: retired  Scientific laboratory technician  . Financial resource strain: Not hard at all  . Food insecurity:    Worry: Never true    Inability: Never true  . Transportation needs:    Medical: No    Non-medical: No  Tobacco Use  . Smoking status: Never Smoker  .  Smokeless tobacco: Never Used  Substance and Sexual Activity  . Alcohol use: No  . Drug use: No  . Sexual activity: Not Currently  Lifestyle  . Physical activity:    Days per week: 0 days    Minutes per session: 0 min  . Stress: Not at all  Relationships  . Social connections:    Talks on phone: Not on file    Gets together: Not on file    Attends religious service: Not on file    Active member of club or organization: Not on file    Attends meetings of clubs or organizations: Not on file    Relationship status: Not on file  Other Topics Concern  . Not on file  Social History Narrative  . Not on file    Outpatient Encounter Medications as of 09/29/2018    Medication Sig  . amLODipine (NORVASC) 10 MG tablet   . atorvastatin (LIPITOR) 20 MG tablet Take 20 mg by mouth daily.   Marland Kitchen b complex-vitamin c-folic acid (NEPHRO-VITE) 0.8 MG TABS Take 0.8 mg by mouth at bedtime.  . furosemide (LASIX) 40 MG tablet Take 40 mg by mouth daily.   . hydrocortisone 2.5 % lotion APP EXT AA  BID PRN  . magnesium gluconate (MAGONATE) 500 MG tablet Take 500 mg by mouth 2 (two) times daily.  . metoprolol (LOPRESSOR) 50 MG tablet Take 50 mg by mouth 2 (two) times daily.  . mycophenolate (CELLCEPT) 250 MG capsule Take 500 mg by mouth 2 (two) times daily.  . pantoprazole (PROTONIX) 40 MG tablet   . potassium chloride SA (K-DUR,KLOR-CON) 20 MEQ tablet Take 20 mEq by mouth daily.   . rivaroxaban (XARELTO) 20 MG TABS tablet Take 20 mg by mouth daily with supper.  . tacrolimus (PROGRAF) 0.5 MG capsule   . tacrolimus (PROGRAF) 1 MG capsule Take 2 mg by mouth 2 (two) times daily.   . [DISCONTINUED] amLODipine (NORVASC) 5 MG tablet Take 5 mg by mouth daily.  . baclofen (LIORESAL) 10 MG tablet Take 1 tablet (10 mg total) by mouth 3 (three) times daily. As needed for muscle spasm (Patient not taking: Reported on 09/29/2018)  . ranitidine (ZANTAC) 150 MG tablet Take 150 mg by mouth daily.  . [DISCONTINUED] ondansetron (ZOFRAN) 4 MG tablet Take 1 tablet (4 mg total) by mouth every 8 (eight) hours as needed for nausea or vomiting. (Patient not taking: Reported on 09/29/2018)  . [DISCONTINUED] oxyCODONE-acetaminophen (ROXICET) 5-325 MG tablet Take 1-2 tablets by mouth every 6 (six) hours as needed for severe pain. (Patient not taking: Reported on 09/29/2018)  . [DISCONTINUED] sennosides-docusate sodium (SENOKOT-S) 8.6-50 MG tablet Take 2 tablets by mouth daily. (Patient not taking: Reported on 09/29/2018)   No facility-administered encounter medications on file as of 09/29/2018.     Activities of Daily Living In your present state of health, do you have any difficulty performing the  following activities: 09/29/2018  Hearing? N  Vision? N  Difficulty concentrating or making decisions? N  Walking or climbing stairs? Y  Comment knee pain  Dressing or bathing? N  Doing errands, shopping? N  Preparing Food and eating ? N  Using the Toilet? N  In the past six months, have you accidently leaked urine? N  Do you have problems with loss of bowel control? N  Managing your Medications? N  Managing your Finances? N  Housekeeping or managing your Housekeeping? N  Some recent data might be hidden    Patient Care Team:  [name], FNP as PCP - General (General Practice)    Assessment:   This is a routine wellness examination for [place].  Exercise Activities and Dietary recommendations Current Exercise Habits: The patient does not participate in regular exercise at present, Exercise limited by: None identified  Goals    . Patient Stated (pt-stated)     Wants to maintain good health    . Weight (lb) < 200 lb (90.7 kg) (pt-stated)       Fall Risk Fall Risk  09/29/2018 03/25/2015  Falls in the past year? 0 No  Risk for fall due to : Medication side effect -   Is the patient's home free of loose throw rugs in walkways, pet beds, electrical cords, etc?   yes      Grab bars in the bathroom? no      Handrails on the stairs?   yes      Adequate lighting?   yes  Timed Get Up and Go performed: n/a   Depression Screen PHQ 2/9 Scores 09/29/2018 03/25/2015  PHQ - 2 Score 0 0  PHQ- 9 Score 0 -     Cognitive Function     6CIT Screen 09/29/2018  What Year? 0 points  What month? 0 points  What time? 0 points  Count back from 20 0 points  Months in reverse 0 points  Repeat phrase 0 points  Total Score 0    Immunization History  Administered Date(s) Administered  . Influenza,inj,Quad PF,6+ Mos 06/20/2015    Qualifies for Shingles Vaccine? yes  Screening Tests Health Maintenance  Topic Date Due  . PNA vac Low Risk Adult (1 of 2 - PCV13) 06/05/2013  .  INFLUENZA VACCINE  03/17/2018  . MAMMOGRAM  11/02/2019  . COLONOSCOPY  09/09/2021  . TETANUS/TDAP  06/04/2024  . DEXA SCAN  Completed  . Hepatitis C Screening  Completed    Cancer Screenings: Lung: Low Dose CT Chest recommended if Age 13-80 years, 30 pack-year currently smoking OR have quit w/in 15years. Patient does not qualify. Breast:  Up to date on Mammogram? Yes   Up to date of Bone Density/Dexa? Yes Colorectal: up to date  Additional Screenings: : Hepatitis C Screening: due     Plan:    Patient would like to lose weight and maintain good health. Order for prevnar 13 entered   I have personally reviewed and noted the following in the patient's chart:   . Medical and social history . Use of alcohol, tobacco or illicit drugs  . Current medications and supplements . Functional ability and status . Nutritional status . Physical activity . Advanced directives . List of other physicians . Hospitalizations, surgeries, and ER visits in previous 12 months . Vitals . Screenings to include cognitive, depression, and falls . Referrals and appointments  In addition, I have reviewed and discussed with patient certain preventive protocols, quality metrics, and best practice recommendations. A written personalized care plan for preventive services as well as general preventive health recommendations were provided to patient.     Kellie Simmering, LPN  1/61/0960

## 2018-09-30 ENCOUNTER — Ambulatory Visit: Payer: Self-pay | Admitting: Nurse Practitioner

## 2018-09-30 LAB — CMP14 + ANION GAP
ALT: 12 IU/L (ref 0–32)
AST: 22 IU/L (ref 0–40)
Albumin/Globulin Ratio: 1.5 (ref 1.2–2.2)
Albumin: 4.4 g/dL (ref 3.8–4.8)
Alkaline Phosphatase: 78 IU/L (ref 39–117)
Anion Gap: 18 mmol/L (ref 10.0–18.0)
BUN/Creatinine Ratio: 12 (ref 12–28)
BUN: 10 mg/dL (ref 8–27)
Bilirubin Total: 0.7 mg/dL (ref 0.0–1.2)
CO2: 24 mmol/L (ref 20–29)
Calcium: 9.8 mg/dL (ref 8.7–10.3)
Chloride: 103 mmol/L (ref 96–106)
Creatinine, Ser: 0.82 mg/dL (ref 0.57–1.00)
GFR calc Af Amer: 84 mL/min/{1.73_m2} (ref >59)
GFR calc non Af Amer: 73 mL/min/{1.73_m2} (ref >59)
Globulin, Total: 3 g/dL (ref 1.5–4.5)
Glucose: 81 mg/dL (ref 65–99)
Potassium: 3.7 mmol/L (ref 3.5–5.2)
Sodium: 145 mmol/L — ABNORMAL HIGH (ref 134–144)
Total Protein: 7.4 g/dL (ref 6.0–8.5)

## 2018-09-30 LAB — LIPID PANEL
Chol/HDL Ratio: 1.8 ratio (ref 0.0–4.4)
Cholesterol, Total: 153 mg/dL (ref 100–199)
HDL: 85 mg/dL (ref >39)
LDL Calculated: 60 mg/dL (ref 0–99)
Triglycerides: 42 mg/dL (ref 0–149)
VLDL Cholesterol Cal: 8 mg/dL (ref 5–40)

## 2018-09-30 LAB — CBC
Hematocrit: 37.6 % (ref 34.0–46.6)
Hemoglobin: 12.4 g/dL (ref 11.1–15.9)
MCH: 30.5 pg (ref 26.6–33.0)
MCHC: 33 g/dL (ref 31.5–35.7)
MCV: 92 fL (ref 79–97)
Platelets: 197 10*3/uL (ref 150–450)
RBC: 4.07 x10E6/uL (ref 3.77–5.28)
RDW: 12.8 % (ref 11.7–15.4)
WBC: 4.6 10*3/uL (ref 3.4–10.8)

## 2018-09-30 LAB — HEPATITIS C ANTIBODY: Hep C Virus Ab: <0.1 s/co ratio (ref 0.0–0.9)

## 2018-10-04 ENCOUNTER — Other Ambulatory Visit: Payer: Self-pay | Admitting: Nurse Practitioner

## 2018-10-07 ENCOUNTER — Other Ambulatory Visit: Payer: Self-pay

## 2018-10-07 DIAGNOSIS — Z992 Dependence on renal dialysis: Principal | ICD-10-CM

## 2018-10-07 DIAGNOSIS — N186 End stage renal disease: Secondary | ICD-10-CM

## 2018-10-13 ENCOUNTER — Other Ambulatory Visit: Payer: Self-pay

## 2018-10-13 ENCOUNTER — Encounter: Payer: Self-pay | Admitting: Vascular Surgery

## 2018-10-13 ENCOUNTER — Ambulatory Visit (HOSPITAL_COMMUNITY)
Admission: RE | Admit: 2018-10-13 | Discharge: 2018-10-13 | Disposition: A | Discharge status: Home / Self Care | Payer: Medicare PPO | Source: Ambulatory Visit | Attending: Vascular Surgery | Admitting: Vascular Surgery

## 2018-10-13 ENCOUNTER — Ambulatory Visit: Payer: Medicare PPO | Admitting: Vascular Surgery

## 2018-10-13 VITALS — BP 156/92 | HR 75 | Resp 18 | Ht 63.4 in | Wt 219.4 lb

## 2018-10-13 DIAGNOSIS — Z992 Dependence on renal dialysis: Secondary | ICD-10-CM | POA: Diagnosis not present

## 2018-10-13 DIAGNOSIS — T82590A Other mechanical complication of surgically created arteriovenous fistula, initial encounter: Secondary | ICD-10-CM

## 2018-10-13 DIAGNOSIS — N186 End stage renal disease: Secondary | ICD-10-CM | POA: Diagnosis not present

## 2018-10-13 NOTE — Progress Notes (Signed)
Patient is a 73 year old female referred for pain over a right upper arm AV graft.  This was placed in 2012.  She was on hemodialysis for 3 years and then received a kidney transplant.  About 2 weeks ago she began to have pain in the right upper arm graft.  She denies any drainage.  She states that sometimes it does swell over it.  Usually the pain is controlled with extra strength Tylenol.  She has not had any fever or chills.  She is on Xarelto for atrial fibrillation.  Review of systems: She denies shortness of breath.  She denies chest pain.  Current Outpatient Medications on File Prior to Visit  Medication Sig Dispense Refill  . amLODipine (NORVASC) 10 MG tablet     . atorvastatin (LIPITOR) 20 MG tablet Take 20 mg by mouth daily.     Marland Kitchen b complex-vitamin c-folic acid (NEPHRO-VITE) 0.8 MG TABS Take 0.8 mg by mouth at bedtime.    . furosemide (LASIX) 40 MG tablet Take 40 mg by mouth daily.     . hydrocortisone 2.5 % lotion APP EXT AA  BID PRN    . magnesium gluconate (MAGONATE) 500 MG tablet Take 500 mg by mouth 2 (two) times daily.    . metoprolol (LOPRESSOR) 50 MG tablet Take 50 mg by mouth 2 (two) times daily.    . mycophenolate (CELLCEPT) 250 MG capsule Take 500 mg by mouth 2 (two) times daily.    . pantoprazole (PROTONIX) 40 MG tablet     . potassium chloride SA (K-DUR,KLOR-CON) 20 MEQ tablet Take 20 mEq by mouth daily.     . ranitidine (ZANTAC) 150 MG tablet Take 150 mg by mouth daily.    . rivaroxaban (XARELTO) 20 MG TABS tablet Take 20 mg by mouth daily with supper.    . tacrolimus (PROGRAF) 0.5 MG capsule     . tacrolimus (PROGRAF) 1 MG capsule Take 2 mg by mouth 2 (two) times daily.     . baclofen (LIORESAL) 10 MG tablet Take 1 tablet (10 mg total) by mouth 3 (three) times daily. As needed for muscle spasm (Patient not taking: Reported on 09/29/2018) 50 tablet 0   No current facility-administered medications on file prior to visit.     Past Medical History:  Diagnosis Date  .  Anemia   . Arthritis   . Atrial fibrillation ([place])   . Bleeding nose    history of   . ESRD (end stage renal disease) ([place])   . History of bronchitis   . Hyperlipemia   . Hypertension   . Joint inflammation   . Obesity   . PONV (postoperative nausea and vomiting)   . Rotator cuff tear arthropathy of right shoulder 06/18/2015  . Thyroid disease   . Wears glasses    Physical exam:  Vitals:   10/13/18 1329  BP: (!) 156/92  Pulse: 75  Resp: 18  SpO2: 97%  Weight: 219 lb 5.7 oz (99.5 kg)  Height: 5' 3.4" (1.61 m)    Extremities: 1+ right radial pulse thrombosed right forearm and upper arm AV graft no audible bruit or palpable thrill  Skin: No erythema no fluctuance no warmth over the graft no obvious large chest wall collaterals  Data: Patient had a duplex ultrasound of her AV graft today which shows occlusion of the right upper arm AV graft with a thrombosed axillary vein  Assessment: Pain over right upper arm AV graft.  Currently no evidence of infection.  The  graft is thrombosed.  Not sure whether or not some of this pain may have been from graft thrombosis and vein irritation/thrombophlebitis.  Plan: The patient will follow-up with [name] on an as-needed basis if she has worsening symptoms or develops signs or symptoms of infection.  Ruta Hinds, MD [facility] Office: [phone] Pager: [phone]

## 2018-10-24 DIAGNOSIS — N186 End stage renal disease: Secondary | ICD-10-CM | POA: Diagnosis not present

## 2018-10-24 DIAGNOSIS — Z940 Kidney transplant status: Secondary | ICD-10-CM | POA: Diagnosis not present

## 2018-10-24 DIAGNOSIS — E785 Hyperlipidemia, unspecified: Secondary | ICD-10-CM | POA: Diagnosis not present

## 2018-10-24 DIAGNOSIS — I10 Essential (primary) hypertension: Secondary | ICD-10-CM | POA: Diagnosis not present

## 2018-10-24 DIAGNOSIS — M1990 Unspecified osteoarthritis, unspecified site: Secondary | ICD-10-CM | POA: Diagnosis not present

## 2018-10-24 DIAGNOSIS — I482 Chronic atrial fibrillation, unspecified: Secondary | ICD-10-CM | POA: Diagnosis not present

## 2018-11-04 DIAGNOSIS — L304 Erythema intertrigo: Secondary | ICD-10-CM | POA: Diagnosis not present

## 2018-11-04 DIAGNOSIS — L308 Other specified dermatitis: Secondary | ICD-10-CM | POA: Diagnosis not present

## 2018-11-22 DIAGNOSIS — Z940 Kidney transplant status: Secondary | ICD-10-CM | POA: Diagnosis not present

## 2018-11-22 DIAGNOSIS — Z23 Encounter for immunization: Secondary | ICD-10-CM | POA: Diagnosis not present

## 2018-11-22 DIAGNOSIS — E876 Hypokalemia: Secondary | ICD-10-CM | POA: Diagnosis not present

## 2018-11-29 ENCOUNTER — Telehealth: Payer: Self-pay

## 2018-11-29 NOTE — Telephone Encounter (Signed)
Pt called with questions about her labs. Concerns were handled patient is satisfied

## 2018-12-01 ENCOUNTER — Ambulatory Visit: Payer: Medicare PPO | Admitting: Podiatry

## 2018-12-01 ENCOUNTER — Encounter: Payer: Self-pay | Admitting: Podiatry

## 2018-12-01 ENCOUNTER — Other Ambulatory Visit: Payer: Self-pay

## 2018-12-01 VITALS — Temp 97.2°F

## 2018-12-01 DIAGNOSIS — B351 Tinea unguium: Secondary | ICD-10-CM | POA: Diagnosis not present

## 2018-12-01 DIAGNOSIS — M79675 Pain in left toe(s): Secondary | ICD-10-CM

## 2018-12-01 DIAGNOSIS — M79674 Pain in right toe(s): Secondary | ICD-10-CM

## 2018-12-01 DIAGNOSIS — M722 Plantar fascial fibromatosis: Secondary | ICD-10-CM

## 2018-12-01 MED ORDER — TRIAMCINOLONE ACETONIDE 10 MG/ML IJ SUSP
10.0000 mg | Freq: Once | INTRAMUSCULAR | Status: AC
  Administered 2018-12-01: 10 mg

## 2018-12-01 NOTE — Progress Notes (Signed)
Subjective:   Patient ID: Sarah Gilbert, female   DOB: 73 y.o.   MRN: [id number]   HPI Patient presents stating the mid arch is really started to hurt again right and the nails are thickened and damaged on the right and left foot   ROS      Objective:  Physical Exam  Mid arch fasciitis right with pain and mycotic painful nailbeds 1-5 both feet     Assessment:  Chronic plantar fasciitis right and mycotic nail infection with pain 1-5 both feet     Plan:  H&P reviewed conditions and did careful mid arch injection right 3 mg Kenalog 5 mg Xylocaine and applied fascial brace to lift the arch.  Debrided nailbeds 1-5 both feet with no iatrogenic bleeding and reappoint to recheck

## 2018-12-01 NOTE — Patient Instructions (Signed)

## 2018-12-06 ENCOUNTER — Other Ambulatory Visit: Payer: Self-pay | Admitting: Nurse Practitioner

## 2018-12-06 DIAGNOSIS — Z1231 Encounter for screening mammogram for malignant neoplasm of breast: Secondary | ICD-10-CM

## 2018-12-29 ENCOUNTER — Other Ambulatory Visit: Payer: Self-pay | Admitting: Nurse Practitioner

## 2018-12-29 ENCOUNTER — Other Ambulatory Visit: Payer: Self-pay

## 2018-12-29 ENCOUNTER — Encounter: Payer: Self-pay | Admitting: Nurse Practitioner

## 2018-12-29 ENCOUNTER — Ambulatory Visit: Payer: Medicare PPO | Admitting: Nurse Practitioner

## 2018-12-29 VITALS — BP 120/76 | HR 66 | Temp 98.3°F | Ht 63.2 in | Wt 223.4 lb

## 2018-12-29 DIAGNOSIS — R319 Hematuria, unspecified: Secondary | ICD-10-CM | POA: Diagnosis not present

## 2018-12-29 DIAGNOSIS — R7309 Other abnormal glucose: Secondary | ICD-10-CM

## 2018-12-29 DIAGNOSIS — E782 Mixed hyperlipidemia: Secondary | ICD-10-CM | POA: Diagnosis not present

## 2018-12-29 DIAGNOSIS — I10 Essential (primary) hypertension: Secondary | ICD-10-CM

## 2018-12-29 LAB — POCT URINALYSIS DIPSTICK
Bilirubin, UA: NEGATIVE
Glucose, UA: NEGATIVE
Ketones, UA: NEGATIVE
Nitrite, UA: NEGATIVE
Protein, UA: POSITIVE — AB
Spec Grav, UA: 1.02 (ref 1.010–1.025)
Urobilinogen, UA: 0.2 E.U./dL
pH, UA: 6.5 (ref 5.0–8.0)

## 2018-12-29 LAB — POCT UA - MICROALBUMIN
Albumin/Creatinine Ratio, Urine, POC: 300
Creatinine, POC: 200 mg/dL
Microalbumin Ur, POC: 150 mg/L

## 2018-12-29 NOTE — Progress Notes (Signed)
Subjective:     Patient ID: Sarah Gilbert , female    DOB: [birth year] , 73 y.o.   MRN: [id number]   Chief Complaint  Patient presents with  . Diabetes    HPI  Continues to go to [facility] every 3 months.     Hypertension  This is a chronic problem. The current episode started more than 1 year ago. The problem is controlled. Pertinent negatives include no anxiety, chest pain or palpitations. There are no associated agents to hypertension. There are no known risk factors for coronary artery disease. Past treatments include calcium channel blockers and diuretics. The current treatment provides significant improvement. There are no compliance problems.  There is no history of angina. There is no history of chronic renal disease.  Hematuria  Chronicity: last visit had moderate blood in urine. She describes the hematuria as microscopic hematuria. Irritative symptoms do not include frequency or nocturia. Pertinent negatives include no abdominal pain, chills, dysuria, nausea or vomiting. She is not sexually active. Her past medical history is significant for hypertension.     Past Medical History:  Diagnosis Date  . Anemia   . Arthritis   . Atrial fibrillation ([place])   . Bleeding nose    history of   . ESRD (end stage renal disease) ([place])   . History of bronchitis   . Hyperlipemia   . Hypertension   . Joint inflammation   . Obesity   . PONV (postoperative nausea and vomiting)   . Rotator cuff tear arthropathy of right shoulder 06/18/2015  . Thyroid disease   . Wears glasses      Family History  Problem Relation Age of Onset  . Dementia Mother   . Cancer Father      Current Outpatient Medications:  .  amLODipine (NORVASC) 10 MG tablet, , Disp: , Rfl:  .  atorvastatin (LIPITOR) 20 MG tablet, Take 20 mg by mouth daily. , Disp: , Rfl:  .  b complex-vitamin c-folic acid (NEPHRO-VITE) 0.8 MG TABS, Take 0.8 mg by mouth at bedtime., Disp: , Rfl:  .  furosemide (LASIX) 40  MG tablet, Take 40 mg by mouth daily. , Disp: , Rfl:  .  magnesium gluconate (MAGONATE) 500 MG tablet, Take 500 mg by mouth 2 (two) times daily., Disp: , Rfl:  .  metoprolol (LOPRESSOR) 50 MG tablet, Take 50 mg by mouth 2 (two) times daily., Disp: , Rfl:  .  mycophenolate (CELLCEPT) 250 MG capsule, Take 500 mg by mouth 2 (two) times daily., Disp: , Rfl:  .  pantoprazole (PROTONIX) 40 MG tablet, , Disp: , Rfl:  .  potassium chloride SA (K-DUR,KLOR-CON) 20 MEQ tablet, Take 20 mEq by mouth daily. , Disp: , Rfl:  .  rivaroxaban (XARELTO) 20 MG TABS tablet, Take 20 mg by mouth daily with supper., Disp: , Rfl:  .  tacrolimus (PROGRAF) 1 MG capsule, Take 2 mg by mouth 2 (two) times daily. , Disp: , Rfl:    Allergies  Allergen Reactions  . Vancomycin Hives, Itching, Rash and Other (See Comments)    Blistering, swelling, and peeling of feet.  Pt denies any similar reactions around face or neck.  . Iohexol      Desc: iodine contrast/per dialysis center, Onset Date: [id number]      Review of Systems  Constitutional: Negative.  Negative for chills.  HENT: Negative.   Eyes: Negative.   Respiratory: Negative.   Cardiovascular: Negative.  Negative for chest pain, palpitations and leg  swelling.  Gastrointestinal: Negative.  Negative for abdominal pain, nausea and vomiting.  Endocrine: Negative.  Negative for polydipsia, polyphagia and polyuria.  Genitourinary: Positive for hematuria. Negative for dysuria, frequency and nocturia.  Musculoskeletal: Negative.   Skin: Negative.   Allergic/Immunologic: Negative.   Neurological: Negative.   Hematological: Negative.   Psychiatric/Behavioral: Negative.      Today's Vitals   12/29/18 0851  BP: 120/76  Pulse: 66  Temp: 98.3 F (36.8 C)  TempSrc: Oral  Weight: 223 lb 6.4 oz (101.3 kg)  Height: 5' 3.2" (1.605 m)  PainSc: 0-No pain   Body mass index is 39.32 kg/m.   Objective:  Physical Exam Vitals signs reviewed.  Constitutional:       Appearance: Normal appearance. She is well-developed. She is obese.  HENT:     Right Ear: Hearing normal.     Left Ear: Hearing normal.  Eyes:     General: Lids are normal.     Funduscopic exam:    Right eye: No papilledema.        Left eye: No papilledema.  Neck:     Musculoskeletal: Full passive range of motion without pain, normal range of motion and neck supple.     Thyroid: No thyroid mass.     Vascular: No carotid bruit.  Cardiovascular:     Rate and Rhythm: Normal rate and regular rhythm.     Pulses: Normal pulses.     Heart sounds: Normal heart sounds. No murmur.  Pulmonary:     Effort: Pulmonary effort is normal.     Breath sounds: Normal breath sounds.  Chest:     Chest wall: No mass.     Breasts:        Right: Normal. No swelling, mass, nipple discharge or tenderness.        Left: Normal. No swelling, mass, nipple discharge or tenderness.  Musculoskeletal:        General: No swelling.     Right lower leg: No edema.     Left lower leg: No edema.  Skin:    General: Skin is warm and dry.     Capillary Refill: Capillary refill takes less than 2 seconds.  Neurological:     General: No focal deficit present.     Mental Status: She is alert and oriented to person, place, and time.     Cranial Nerves: No cranial nerve deficit.     Sensory: No sensory deficit.  Psychiatric:        Mood and Affect: Mood normal.        Behavior: Behavior normal.        Thought Content: Thought content normal.        Judgment: Judgment normal.         Assessment And Plan:     1. Hypertension, essential . B/P is well controlled.  . Continues to be followed by Dr. Terrence Dupont . BMP ordered to check renal function.  . The importance of regular exercise and dietary modification was stressed to the patient.  . Stressed importance of losing ten percent of her body weight to help with B/P control.  . The weight loss would help with decreasing cardiac and cancer risk as well.    2. Mixed  hyperlipidemia  Chronic, controlled  Continue with current medications  3. Hematuria, unspecified type  Will recheck urine, I do not see the culture results in the system from before  4. Abnormal glucose  Elevated at last visit will check  HgbA1c  Encouraged to limit intake of sugary foods and drinks.   [name], FNP    THE PATIENT IS ENCOURAGED TO PRACTICE SOCIAL DISTANCING DUE TO THE COVID-19 PANDEMIC.

## 2018-12-29 NOTE — Addendum Note (Signed)
Addended by: Luana Shu on: 12/29/2018 01:31 PM   Modules accepted: Orders

## 2018-12-29 NOTE — Addendum Note (Signed)
Addended by: Luana Shu on: 12/29/2018 10:38 AM   Modules accepted: Orders

## 2018-12-30 LAB — CBC WITH DIFFERENTIAL/PLATELET
Basophils Absolute: 0 10*3/uL (ref 0.0–0.2)
Basos: 0 %
EOS (ABSOLUTE): 0.2 10*3/uL (ref 0.0–0.4)
Eos: 3 %
Hematocrit: 39.8 % (ref 34.0–46.6)
Hemoglobin: 13.4 g/dL (ref 11.1–15.9)
Immature Grans (Abs): 0 10*3/uL (ref 0.0–0.1)
Immature Granulocytes: 0 %
Lymphocytes Absolute: 1.4 10*3/uL (ref 0.7–3.1)
Lymphs: 31 %
MCH: 31.3 pg (ref 26.6–33.0)
MCHC: 33.7 g/dL (ref 31.5–35.7)
MCV: 93 fL (ref 79–97)
Monocytes Absolute: 0.3 10*3/uL (ref 0.1–0.9)
Monocytes: 7 %
Neutrophils Absolute: 2.6 10*3/uL (ref 1.4–7.0)
Neutrophils: 59 %
Platelets: 220 10*3/uL (ref 150–450)
RBC: 4.28 x10E6/uL (ref 3.77–5.28)
RDW: 12.9 % (ref 11.7–15.4)
WBC: 4.5 10*3/uL (ref 3.4–10.8)

## 2018-12-30 LAB — HEMOGLOBIN A1C
Est. average glucose Bld gHb Est-mCnc: 117 mg/dL
Hgb A1c MFr Bld: 5.7 % — ABNORMAL HIGH (ref 4.8–5.6)

## 2018-12-30 LAB — URINE CULTURE

## 2019-01-23 DIAGNOSIS — E785 Hyperlipidemia, unspecified: Secondary | ICD-10-CM | POA: Diagnosis not present

## 2019-01-23 DIAGNOSIS — I4891 Unspecified atrial fibrillation: Secondary | ICD-10-CM | POA: Diagnosis not present

## 2019-01-23 DIAGNOSIS — I10 Essential (primary) hypertension: Secondary | ICD-10-CM | POA: Diagnosis not present

## 2019-01-23 DIAGNOSIS — D171 Benign lipomatous neoplasm of skin and subcutaneous tissue of trunk: Secondary | ICD-10-CM | POA: Diagnosis not present

## 2019-01-23 DIAGNOSIS — D631 Anemia in chronic kidney disease: Secondary | ICD-10-CM | POA: Diagnosis not present

## 2019-01-23 DIAGNOSIS — R809 Proteinuria, unspecified: Secondary | ICD-10-CM | POA: Diagnosis not present

## 2019-01-23 DIAGNOSIS — D899 Disorder involving the immune mechanism, unspecified: Secondary | ICD-10-CM | POA: Diagnosis not present

## 2019-01-23 DIAGNOSIS — I129 Hypertensive chronic kidney disease with stage 1 through stage 4 chronic kidney disease, or unspecified chronic kidney disease: Secondary | ICD-10-CM | POA: Diagnosis not present

## 2019-01-23 DIAGNOSIS — Z940 Kidney transplant status: Secondary | ICD-10-CM | POA: Diagnosis not present

## 2019-01-23 DIAGNOSIS — M1990 Unspecified osteoarthritis, unspecified site: Secondary | ICD-10-CM | POA: Diagnosis not present

## 2019-01-23 DIAGNOSIS — N189 Chronic kidney disease, unspecified: Secondary | ICD-10-CM | POA: Diagnosis not present

## 2019-01-23 DIAGNOSIS — Z8739 Personal history of other diseases of the musculoskeletal system and connective tissue: Secondary | ICD-10-CM | POA: Diagnosis not present

## 2019-01-24 DIAGNOSIS — N390 Urinary tract infection, site not specified: Secondary | ICD-10-CM | POA: Diagnosis not present

## 2019-02-01 ENCOUNTER — Other Ambulatory Visit: Payer: Self-pay

## 2019-02-01 ENCOUNTER — Ambulatory Visit
Admission: RE | Admit: 2019-02-01 | Discharge: 2019-02-01 | Disposition: A | Discharge status: Home / Self Care | Payer: Medicare PPO | Source: Ambulatory Visit | Attending: Nurse Practitioner | Admitting: Nurse Practitioner

## 2019-02-01 DIAGNOSIS — Z1231 Encounter for screening mammogram for malignant neoplasm of breast: Secondary | ICD-10-CM | POA: Diagnosis not present

## 2019-03-07 ENCOUNTER — Ambulatory Visit: Payer: Medicare PPO | Admitting: Podiatry

## 2019-04-12 DIAGNOSIS — Z940 Kidney transplant status: Secondary | ICD-10-CM | POA: Diagnosis not present

## 2019-04-12 DIAGNOSIS — I4891 Unspecified atrial fibrillation: Secondary | ICD-10-CM | POA: Diagnosis not present

## 2019-04-12 DIAGNOSIS — D171 Benign lipomatous neoplasm of skin and subcutaneous tissue of trunk: Secondary | ICD-10-CM | POA: Diagnosis not present

## 2019-04-26 DIAGNOSIS — I10 Essential (primary) hypertension: Secondary | ICD-10-CM | POA: Diagnosis not present

## 2019-04-26 DIAGNOSIS — I480 Paroxysmal atrial fibrillation: Secondary | ICD-10-CM | POA: Diagnosis not present

## 2019-04-26 DIAGNOSIS — E785 Hyperlipidemia, unspecified: Secondary | ICD-10-CM | POA: Diagnosis not present

## 2019-04-26 DIAGNOSIS — M1990 Unspecified osteoarthritis, unspecified site: Secondary | ICD-10-CM | POA: Diagnosis not present

## 2019-04-26 DIAGNOSIS — N186 End stage renal disease: Secondary | ICD-10-CM | POA: Diagnosis not present

## 2019-05-25 DIAGNOSIS — Z940 Kidney transplant status: Secondary | ICD-10-CM | POA: Diagnosis not present

## 2019-05-25 DIAGNOSIS — I129 Hypertensive chronic kidney disease with stage 1 through stage 4 chronic kidney disease, or unspecified chronic kidney disease: Secondary | ICD-10-CM | POA: Diagnosis not present

## 2019-05-25 DIAGNOSIS — D631 Anemia in chronic kidney disease: Secondary | ICD-10-CM | POA: Diagnosis not present

## 2019-05-25 DIAGNOSIS — N189 Chronic kidney disease, unspecified: Secondary | ICD-10-CM | POA: Diagnosis not present

## 2019-05-25 DIAGNOSIS — D171 Benign lipomatous neoplasm of skin and subcutaneous tissue of trunk: Secondary | ICD-10-CM | POA: Diagnosis not present

## 2019-05-25 DIAGNOSIS — D899 Disorder involving the immune mechanism, unspecified: Secondary | ICD-10-CM | POA: Diagnosis not present

## 2019-05-25 DIAGNOSIS — Z8739 Personal history of other diseases of the musculoskeletal system and connective tissue: Secondary | ICD-10-CM | POA: Diagnosis not present

## 2019-05-25 DIAGNOSIS — R809 Proteinuria, unspecified: Secondary | ICD-10-CM | POA: Diagnosis not present

## 2019-05-25 DIAGNOSIS — Z1159 Encounter for screening for other viral diseases: Secondary | ICD-10-CM | POA: Diagnosis not present

## 2019-05-31 ENCOUNTER — Ambulatory Visit: Payer: Medicare PPO | Admitting: Nurse Practitioner

## 2019-07-26 DIAGNOSIS — N186 End stage renal disease: Secondary | ICD-10-CM | POA: Diagnosis not present

## 2019-07-26 DIAGNOSIS — E785 Hyperlipidemia, unspecified: Secondary | ICD-10-CM | POA: Diagnosis not present

## 2019-07-26 DIAGNOSIS — I480 Paroxysmal atrial fibrillation: Secondary | ICD-10-CM | POA: Diagnosis not present

## 2019-07-26 DIAGNOSIS — M1990 Unspecified osteoarthritis, unspecified site: Secondary | ICD-10-CM | POA: Diagnosis not present

## 2019-07-26 DIAGNOSIS — I10 Essential (primary) hypertension: Secondary | ICD-10-CM | POA: Diagnosis not present

## 2019-08-01 DIAGNOSIS — Z23 Encounter for immunization: Secondary | ICD-10-CM | POA: Diagnosis not present

## 2019-10-05 ENCOUNTER — Ambulatory Visit: Payer: Medicare PPO

## 2019-10-05 ENCOUNTER — Encounter: Payer: Medicare PPO | Admitting: Nurse Practitioner

## 2019-10-13 ENCOUNTER — Ambulatory Visit: Payer: Medicare PPO | Attending: Internal Medicine

## 2019-10-13 DIAGNOSIS — Z23 Encounter for immunization: Secondary | ICD-10-CM

## 2019-10-13 NOTE — Progress Notes (Signed)
   Covid-19 Vaccination Clinic  Name:  Sarah Gilbert    MRN: [id number] DOB: May 01, 1946  10/13/2019  Sarah Gilbert was observed post Covid-19 immunization for 15 minutes without incidence. She was provided with Vaccine Information Sheet and instruction to access the V-Safe system.   Sarah Gilbert was instructed to call 911 with any severe reactions post vaccine: Marland Kitchen Difficulty breathing  . Swelling of your face and throat  . A fast heartbeat  . A bad rash all over your body  . Dizziness and weakness    Immunizations Administered    Name Date Dose VIS Date Route   Pfizer COVID-19 Vaccine 10/13/2019 12:17 PM 0.3 mL 07/28/2019 Intramuscular   Manufacturer: [place]   Lot: [id number]   [place]: [id number]

## 2019-10-26 ENCOUNTER — Ambulatory Visit: Payer: Medicare Other

## 2019-10-26 ENCOUNTER — Encounter: Payer: Self-pay | Admitting: Nurse Practitioner

## 2019-11-07 ENCOUNTER — Ambulatory Visit: Payer: Medicare Other | Attending: Internal Medicine

## 2019-11-07 DIAGNOSIS — Z23 Encounter for immunization: Secondary | ICD-10-CM

## 2019-11-07 NOTE — Progress Notes (Signed)
   Covid-19 Vaccination Clinic  Name:  Mackensey Bolte    MRN: [id number] DOB: October 20, 1945  11/07/2019  Ms. Kaminsky was observed post Covid-19 immunization for 15 minutes without incident. She was provided with Vaccine Information Sheet and instruction to access the V-Safe system.   Ms. Rada was instructed to call 911 with any severe reactions post vaccine: Marland Kitchen Difficulty breathing  . Swelling of face and throat  . A fast heartbeat  . A bad rash all over body  . Dizziness and weakness   Immunizations Administered    Name Date Dose VIS Date Route   Pfizer COVID-19 Vaccine 11/07/2019  2:44 PM 0.3 mL 07/28/2019 Intramuscular   Manufacturer: [place]   Lot: [id number]   [place]: [id number]

## 2019-11-16 ENCOUNTER — Ambulatory Visit (INDEPENDENT_AMBULATORY_CARE_PROVIDER_SITE_OTHER): Payer: Medicare Other | Admitting: Nurse Practitioner

## 2019-11-16 ENCOUNTER — Encounter: Payer: Self-pay | Admitting: Nurse Practitioner

## 2019-11-16 ENCOUNTER — Other Ambulatory Visit: Payer: Self-pay

## 2019-11-16 VITALS — BP 132/80 | HR 75 | Temp 98.3°F | Ht 63.6 in | Wt 227.0 lb

## 2019-11-16 DIAGNOSIS — I10 Essential (primary) hypertension: Secondary | ICD-10-CM

## 2019-11-16 DIAGNOSIS — L0232 Furuncle of buttock: Secondary | ICD-10-CM

## 2019-11-16 DIAGNOSIS — E782 Mixed hyperlipidemia: Secondary | ICD-10-CM

## 2019-11-16 DIAGNOSIS — R7309 Other abnormal glucose: Secondary | ICD-10-CM

## 2019-11-16 MED ORDER — CEPHALEXIN 500 MG PO CAPS: 500.0000 mg | ORAL_CAPSULE | Freq: Four times a day (QID) | ORAL | 0 refills | Status: AC

## 2019-11-16 NOTE — Patient Instructions (Signed)
   Warm compresses to the area 3-4 times a day  Take antibiotic until completely gone.

## 2019-11-16 NOTE — Progress Notes (Signed)
This visit occurred during the SARS-CoV-2 public health emergency.  Safety protocols were in place, including screening questions prior to the visit, additional usage of staff PPE, and extensive cleaning of exam room while observing appropriate contact time as indicated for disinfecting solutions.  Subjective:     Patient ID: Sarah Gilbert , female    DOB: [birth year] , 74 y.o.   MRN: [id number]   Chief Complaint  Patient presents with  . Recurrent Skin Infections    patient stated she has a boil on her bottom.     HPI  Boil - she has had an area on her buttocks that is a boil.  Pain about one week ago, wants to look at the area.      Past Medical History:  Diagnosis Date  . Anemia   . Arthritis   . Atrial fibrillation ([place])   . Bleeding nose    history of   . ESRD (end stage renal disease) ([place])   . History of bronchitis   . Hyperlipemia   . Hypertension   . Joint inflammation   . Obesity   . PONV (postoperative nausea and vomiting)   . Rotator cuff tear arthropathy of right shoulder 06/18/2015  . Thyroid disease   . Wears glasses      Family History  Problem Relation Age of Onset  . Dementia Mother   . Cancer Father      Current Outpatient Medications:  .  amLODipine (NORVASC) 10 MG tablet, Take 5 mg by mouth daily. , Disp: , Rfl:  .  atorvastatin (LIPITOR) 20 MG tablet, Take 20 mg by mouth daily. , Disp: , Rfl:  .  b complex-vitamin c-folic acid (NEPHRO-VITE) 0.8 MG TABS, Take 0.8 mg by mouth at bedtime., Disp: , Rfl:  .  furosemide (LASIX) 40 MG tablet, Take 40 mg by mouth daily. , Disp: , Rfl:  .  magnesium gluconate (MAGONATE) 500 MG tablet, Take 500 mg by mouth 2 (two) times daily., Disp: , Rfl:  .  metoprolol (LOPRESSOR) 50 MG tablet, Take 50 mg by mouth 2 (two) times daily., Disp: , Rfl:  .  mycophenolate (CELLCEPT) 250 MG capsule, Take 500 mg by mouth 2 (two) times daily., Disp: , Rfl:  .  pantoprazole (PROTONIX) 40 MG tablet, , Disp: , Rfl:  .   potassium chloride SA (K-DUR,KLOR-CON) 20 MEQ tablet, Take 20 mEq by mouth daily. , Disp: , Rfl:  .  rivaroxaban (XARELTO) 20 MG TABS tablet, Take 20 mg by mouth daily with supper., Disp: , Rfl:  .  tacrolimus (PROGRAF) 0.5 MG capsule, Take 0.5 mg by mouth 2 (two) times daily. Take one capsule along with the 56m capsule, Disp: , Rfl:  .  tacrolimus (PROGRAF) 1 MG capsule, Take 2 mg by mouth 2 (two) times daily. , Disp: , Rfl:    Allergies  Allergen Reactions  . Vancomycin Hives, Itching, Rash and Other (See Comments)    Blistering, swelling, and peeling of feet.  Pt denies any similar reactions around face or neck.  . Iohexol      Desc: iodine contrast/per dialysis center, Onset Date: [id number]     Review of Systems  Constitutional: Negative.   Respiratory: Negative.   Cardiovascular: Negative.  Negative for chest pain, palpitations and leg swelling.  Skin:       Boil to inner buttocks on right side  Neurological: Negative for dizziness and headaches.  Psychiatric/Behavioral: Negative.      Today's Vitals  11/16/19 1417  BP: 132/80  Pulse: 75  Temp: 98.3 F (36.8 C)  Weight: 227 lb (103 kg)  Height: 5' 3.6" (1.615 m)  PainSc: 0-No pain   Body mass index is 39.46 kg/m.   Objective:  Physical Exam Constitutional:      Appearance: Normal appearance.  Cardiovascular:     Rate and Rhythm: Normal rate and regular rhythm.     Pulses: Normal pulses.     Heart sounds: Normal heart sounds. No murmur.  Skin:    General: Skin is warm and dry.     Comments: Firm area noted to right side of buttocks, no erythema present  Neurological:     Mental Status: She is alert.         Assessment And Plan:     1. Boil of buttock  Firm boil to right buttocks, advised to place warm compress to area 3-4 times a day  If symptoms worsen return to office. - cephALEXin (KEFLEX) 500 MG capsule; Take 1 capsule (500 mg total) by mouth 4 (four) times daily for 10 days.  Dispense: 40  capsule; Refill: 0  2. Mixed hyperlipidemia  Chronic, she has not been to the office in more than 6 months will check lipid panel - Lipid panel - CBC - CMP14+EGFR  3. Hypertension, essential  Chronic, fair control  Continue medications  - CMP14+EGFR  4. Abnormal glucose  Chronic, stable,  Will check HgbA1c - Hemoglobin A1c - CMP14+EGFR   [name], FNP    THE PATIENT IS ENCOURAGED TO PRACTICE SOCIAL DISTANCING DUE TO THE COVID-19 PANDEMIC.

## 2019-11-17 LAB — CMP14+EGFR
ALT: 32 IU/L (ref 0–32)
AST: 27 IU/L (ref 0–40)
Albumin/Globulin Ratio: 1.4 (ref 1.2–2.2)
Albumin: 4.5 g/dL (ref 3.7–4.7)
Alkaline Phosphatase: 131 IU/L — ABNORMAL HIGH (ref 39–117)
BUN/Creatinine Ratio: 11 — ABNORMAL LOW (ref 12–28)
BUN: 10 mg/dL (ref 8–27)
Bilirubin Total: 0.8 mg/dL (ref 0.0–1.2)
CO2: 24 mmol/L (ref 20–29)
Calcium: 10 mg/dL (ref 8.7–10.3)
Chloride: 105 mmol/L (ref 96–106)
Creatinine, Ser: 0.94 mg/dL (ref 0.57–1.00)
GFR calc Af Amer: 71 mL/min/{1.73_m2} (ref >59)
GFR calc non Af Amer: 61 mL/min/{1.73_m2} (ref >59)
Globulin, Total: 3.3 g/dL (ref 1.5–4.5)
Glucose: 98 mg/dL (ref 65–99)
Potassium: 3.4 mmol/L — ABNORMAL LOW (ref 3.5–5.2)
Sodium: 146 mmol/L — ABNORMAL HIGH (ref 134–144)
Total Protein: 7.8 g/dL (ref 6.0–8.5)

## 2019-11-17 LAB — LIPID PANEL
Chol/HDL Ratio: 2.2 ratio (ref 0.0–4.4)
Cholesterol, Total: 169 mg/dL (ref 100–199)
HDL: 78 mg/dL (ref >39)
LDL Chol Calc (NIH): 81 mg/dL (ref 0–99)
Triglycerides: 50 mg/dL (ref 0–149)
VLDL Cholesterol Cal: 10 mg/dL (ref 5–40)

## 2019-11-17 LAB — HEMOGLOBIN A1C
Est. average glucose Bld gHb Est-mCnc: 120 mg/dL
Hgb A1c MFr Bld: 5.8 % — ABNORMAL HIGH (ref 4.8–5.6)

## 2019-11-17 LAB — CBC
Hematocrit: 39.7 % (ref 34.0–46.6)
Hemoglobin: 13.3 g/dL (ref 11.1–15.9)
MCH: 30 pg (ref 26.6–33.0)
MCHC: 33.5 g/dL (ref 31.5–35.7)
MCV: 90 fL (ref 79–97)
Platelets: 254 10*3/uL (ref 150–450)
RBC: 4.43 x10E6/uL (ref 3.77–5.28)
RDW: 12.7 % (ref 11.7–15.4)
WBC: 6.3 10*3/uL (ref 3.4–10.8)

## 2019-11-29 ENCOUNTER — Ambulatory Visit (INDEPENDENT_AMBULATORY_CARE_PROVIDER_SITE_OTHER): Payer: Medicare Other | Admitting: Nurse Practitioner

## 2019-11-29 ENCOUNTER — Encounter: Payer: Self-pay | Admitting: Nurse Practitioner

## 2019-11-29 ENCOUNTER — Ambulatory Visit (INDEPENDENT_AMBULATORY_CARE_PROVIDER_SITE_OTHER): Payer: Medicare Other

## 2019-11-29 ENCOUNTER — Telehealth: Payer: Self-pay

## 2019-11-29 ENCOUNTER — Other Ambulatory Visit: Payer: Self-pay

## 2019-11-29 VITALS — BP 132/80 | HR 71 | Temp 98.3°F | Ht 63.6 in | Wt 232.8 lb

## 2019-11-29 DIAGNOSIS — R82998 Other abnormal findings in urine: Secondary | ICD-10-CM

## 2019-11-29 DIAGNOSIS — T733XXA Exhaustion due to excessive exertion, initial encounter: Secondary | ICD-10-CM

## 2019-11-29 DIAGNOSIS — R197 Diarrhea, unspecified: Secondary | ICD-10-CM

## 2019-11-29 DIAGNOSIS — R1901 Right upper quadrant abdominal swelling, mass and lump: Secondary | ICD-10-CM

## 2019-11-29 DIAGNOSIS — R7309 Other abnormal glucose: Secondary | ICD-10-CM

## 2019-11-29 DIAGNOSIS — R21 Rash and other nonspecific skin eruption: Secondary | ICD-10-CM

## 2019-11-29 DIAGNOSIS — Z0000 Encounter for general adult medical examination without abnormal findings: Secondary | ICD-10-CM | POA: Diagnosis not present

## 2019-11-29 DIAGNOSIS — I10 Essential (primary) hypertension: Secondary | ICD-10-CM

## 2019-11-29 LAB — POCT URINALYSIS DIPSTICK
Bilirubin, UA: NEGATIVE
Glucose, UA: NEGATIVE
Ketones, UA: NEGATIVE
Nitrite, UA: NEGATIVE
Protein, UA: POSITIVE — AB
Spec Grav, UA: 1.02 (ref 1.010–1.025)
Urobilinogen, UA: 0.2 E.U./dL
pH, UA: 6.5 (ref 5.0–8.0)

## 2019-11-29 LAB — POCT UA - MICROALBUMIN
Creatinine, POC: 200 mg/dL
Microalbumin Ur, POC: 80 mg/L

## 2019-11-29 MED ORDER — NYSTATIN-TRIAMCINOLONE 100000-0.1 UNIT/GM-% EX OINT: 1.0000 "application " | TOPICAL_OINTMENT | Freq: Two times a day (BID) | CUTANEOUS | 0 refills | Status: DC

## 2019-11-29 NOTE — Progress Notes (Signed)
Subjective:     Patient ID: Sarah Gilbert , female    DOB: 1946-05-07 , 74 y.o.   MRN: [id number]   Chief Complaint  Patient presents with  . Annual Exam    HPI  Seen Dr. Terrence Dupont in recent months.    Here for HM.    Hypertension This is a chronic problem. The current episode started more than 1 year ago. The problem is unchanged. The problem is controlled. Pertinent negatives include no anxiety, chest pain, headaches or palpitations. There are no associated agents to hypertension. There are no known risk factors for coronary artery disease. Past treatments include beta blockers. There are no compliance problems.  There is no history of angina. There is no history of chronic renal disease.  Diarrhea  This is a new problem. The current episode started more than 1 year ago (several years per patient). The problem occurs less than 2 times per day. The problem has been unchanged. Pertinent negatives include no headaches.    The patient states she is status post hysterectomy Mammogram last done 02/01/2019.  Negative for: breast discharge, breast lump(s), breast pain and breast self exam.  Pertinent negatives include abnormal bleeding (hematology), anxiety, decreased libido, depression, difficulty falling sleep, dyspareunia, history of infertility, nocturia, sexual dysfunction, sleep disturbances, urinary incontinence, urinary urgency, vaginal discharge and vaginal itching. Diet regular.The patient states her exercise level is minimal.   The patient's tobacco use is:  Social History   Tobacco Use  Smoking Status Never Smoker  Smokeless Tobacco Never Used   She has been exposed to passive smoke. The patient's alcohol use is:  Social History   Substance and Sexual Activity  Alcohol Use No    Past Medical History:  Diagnosis Date  . Anemia   . Arthritis   . Atrial fibrillation ([place])   . Bleeding nose    history of   . ESRD (end stage renal disease) ([place])   . History of  bronchitis   . Hyperlipemia   . Hypertension   . Joint inflammation   . Obesity   . PONV (postoperative nausea and vomiting)   . Rotator cuff tear arthropathy of right shoulder 06/18/2015  . Thyroid disease   . Wears glasses      Family History  Problem Relation Age of Onset  . Dementia Mother   . Cancer Father      Current Outpatient Medications:  .  amLODipine (NORVASC) 10 MG tablet, Take 5 mg by mouth daily. , Disp: , Rfl:  .  atorvastatin (LIPITOR) 20 MG tablet, Take 20 mg by mouth daily. , Disp: , Rfl:  .  b complex-vitamin c-folic acid (NEPHRO-VITE) 0.8 MG TABS, Take 0.8 mg by mouth at bedtime., Disp: , Rfl:  .  furosemide (LASIX) 40 MG tablet, Take 40 mg by mouth daily. , Disp: , Rfl:  .  magnesium gluconate (MAGONATE) 500 MG tablet, Take 500 mg by mouth 2 (two) times daily., Disp: , Rfl:  .  metoprolol (LOPRESSOR) 50 MG tablet, Take 50 mg by mouth 2 (two) times daily., Disp: , Rfl:  .  mycophenolate (CELLCEPT) 250 MG capsule, Take 500 mg by mouth 2 (two) times daily., Disp: , Rfl:  .  pantoprazole (PROTONIX) 40 MG tablet, , Disp: , Rfl:  .  potassium chloride SA (K-DUR,KLOR-CON) 20 MEQ tablet, Take 20 mEq by mouth daily. , Disp: , Rfl:  .  rivaroxaban (XARELTO) 20 MG TABS tablet, Take 20 mg by mouth daily with supper., Disp: ,  Rfl:  .  tacrolimus (PROGRAF) 0.5 MG capsule, Take 0.5 mg by mouth 2 (two) times daily. Take one capsule along with the 1mg  capsule, Disp: , Rfl:  .  tacrolimus (PROGRAF) 1 MG capsule, Take 2 mg by mouth 2 (two) times daily. , Disp: , Rfl:    Allergies  Allergen Reactions  . Vancomycin Hives, Itching, Rash and Other (See Comments)    Blistering, swelling, and peeling of feet.  Pt denies any similar reactions around face or neck.  . Iohexol      Desc: iodine contrast/per dialysis center, Onset Date: [id number]      Review of Systems  Constitutional: Negative.   HENT: Negative.   Eyes: Negative.   Respiratory: Negative.   Cardiovascular:  Negative.  Negative for chest pain, palpitations and leg swelling.  Gastrointestinal: Positive for diarrhea.  Endocrine: Negative.  Negative for polydipsia, polyphagia and polyuria.  Genitourinary: Negative.   Musculoskeletal: Negative.   Skin: Positive for rash (dry itching rash behind left knee).       Left posterior back with soft mass she feels like slightly larger  Allergic/Immunologic: Negative.   Neurological: Negative.  Negative for dizziness and headaches.  Hematological: Negative.   Psychiatric/Behavioral: Negative.      Today's Vitals   11/29/19 0910  BP: 132/80  Pulse: 71  Temp: 98.3 F (36.8 C)  TempSrc: Oral  Weight: 232 lb 12.8 oz (105.6 kg)  Height: 5' 3.6" (1.615 m)  PainSc: 0-No pain   Body mass index is 40.46 kg/m.   Objective:  Physical Exam Vitals reviewed.  Constitutional:      General: She is not in acute distress.    Appearance: Normal appearance. She is well-developed. She is obese.  HENT:     Head: Normocephalic and atraumatic.     Right Ear: Hearing, tympanic membrane, ear canal and external ear normal. There is no impacted cerumen.     Left Ear: Hearing, tympanic membrane, ear canal and external ear normal. There is no impacted cerumen.     Nose:     Comments: Deferred - mask    Mouth/Throat:     Comments: Deferred - mask  Eyes:     General: Lids are normal.     Conjunctiva/sclera: Conjunctivae normal.     Funduscopic exam:    Right eye: No papilledema.        Left eye: No papilledema.  Neck:     Thyroid: No thyroid mass.     Vascular: No carotid bruit.  Cardiovascular:     Rate and Rhythm: Normal rate and regular rhythm.     Pulses: Normal pulses.     Heart sounds: Normal heart sounds. No murmur.  Pulmonary:     Effort: Pulmonary effort is normal. No respiratory distress.     Breath sounds: Normal breath sounds.  Chest:     Chest wall: No mass.     Breasts:        Right: Normal. No swelling, mass, nipple discharge or tenderness.         Left: Normal. No swelling, mass, nipple discharge or tenderness.  Abdominal:     General: Abdomen is flat. Bowel sounds are normal.     Palpations: Abdomen is soft. There is mass (firm mass to left abdomen, nontender).     Tenderness: There is no abdominal tenderness.  Musculoskeletal:        General: No swelling. Normal range of motion.     Cervical back: Full passive range of  motion without pain, normal range of motion and neck supple.     Right lower leg: No edema.     Left lower leg: No edema.  Skin:    General: Skin is warm and dry.     Capillary Refill: Capillary refill takes less than 2 seconds.     Findings: Rash (hyperpigmented and dry, scaly rash behind left knee) present.       Neurological:     General: No focal deficit present.     Mental Status: She is alert and oriented to person, place, and time.     Cranial Nerves: No cranial nerve deficit.     Sensory: No sensory deficit.  Psychiatric:        Mood and Affect: Mood normal.        Behavior: Behavior normal.        Thought Content: Thought content normal.        Judgment: Judgment normal.         Assessment And Plan:   1. Health maintenance examination . Behavior modifications discussed and diet history reviewed.   . Pt will continue to exercise regularly and modify diet with low GI, plant based foods and decrease intake of processed foods.  . Recommend intake of daily multivitamin, Vitamin D, and calcium.  . Recommend for preventive screenings, as well as recommend immunizations that include influenza, TDAP  2. Hypertension, essential B/P is controlled.  CMP ordered to check renal function.  The importance of regular exercise and dietary modification was stressed to the patient.  - POCT Urinalysis Dipstick (81002) - POCT UA - Microalbumin - EKG 12-Lead  3. Abnormal glucose Chronic, controlled Continue with current medications Encouraged to limit intake of sugary foods and drinks Encouraged to  increase physical activity to 150 minutes per week as toleratered  4. Fatigue due to excessive exertion, initial encounter  She does not have swelling to her lower extremities at this time  I will check BNP and add to her labs  Also discussed deconditioning  5. Diarrhea, unspecified type  Reports this as being ongoing for several years may be related to irritable bowel  I will give her samples of Restora (probiotic) to take   Advised to make sure she remains well hydrated with water.  6. Right upper quadrant abdominal mass  Firm area to right abdomen, nontender, will send for ultrasound for further evaluation.  - US Pelvic Complete With Transvaginal; Future  7. Urine WBC increased  Large white cells in urine  Will send for culture before treating as she did not have any symptoms.  - Culture, Urine     [name], FNP

## 2019-11-29 NOTE — Patient Instructions (Signed)
[name] , Thank you for taking time to come for your Medicare Wellness Visit. I appreciate your ongoing commitment to your health goals. Please review the following plan we discussed and let me know if I can assist you in the future.   Screening recommendations/referrals: Colonoscopy: 08/2011 Mammogram: 01/2019 Bone Density: 10/2017 Recommended yearly ophthalmology/optometry visit for glaucoma screening and checkup Recommended yearly dental visit for hygiene and checkup  Vaccinations: Influenza vaccine: decline Pneumococcal vaccine: hold due to covid vaccine Tdap vaccine: 05/2014 Shingles vaccine: discussed    Advanced directives: Advance directive discussed with you today. I have provided a copy for you to complete at home and have notarized. Once this is complete please bring a copy in to our office so we can scan it into your chart.  Conditions/risks identified: obesity  Next appointment: 05/13/2020 at 8:30   Preventive Care 72 Years and Older, Female Preventive care refers to lifestyle choices and visits with your health care provider that can promote health and wellness. What does preventive care include?  A yearly physical exam. This is also called an annual well check.  Dental exams once or twice a year.  Routine eye exams. Ask your health care provider how often you should have your eyes checked.  Personal lifestyle choices, including:  Daily care of your teeth and gums.  Regular physical activity.  Eating a healthy diet.  Avoiding tobacco and drug use.  Limiting alcohol use.  Practicing safe sex.  Taking low-dose aspirin every day.  Taking vitamin and mineral supplements as recommended by your health care provider. What happens during an annual well check? The services and screenings done by your health care provider during your annual well check will depend on your age, overall health, lifestyle risk factors, and family history of disease. Counseling    Your health care provider may ask you questions about your:  Alcohol use.  Tobacco use.  Drug use.  Emotional well-being.  Home and relationship well-being.  Sexual activity.  Eating habits.  History of falls.  Memory and ability to understand (cognition).  Work and work Statistician.  Reproductive health. Screening  You may have the following tests or measurements:  Height, weight, and BMI.  Blood pressure.  Lipid and cholesterol levels. These may be checked every 5 years, or more frequently if you are over 4 years old.  Skin check.  Lung cancer screening. You may have this screening every year starting at age 40 if you have a 30-pack-year history of smoking and currently smoke or have quit within the past 15 years.  Fecal occult blood test (FOBT) of the stool. You may have this test every year starting at age 75.  Flexible sigmoidoscopy or colonoscopy. You may have a sigmoidoscopy every 5 years or a colonoscopy every 10 years starting at age 54.  Hepatitis C blood test.  Hepatitis B blood test.  Sexually transmitted disease (STD) testing.  Diabetes screening. This is done by checking your blood sugar (glucose) after you have not eaten for a while (fasting). You may have this done every 1-3 years.  Bone density scan. This is done to screen for osteoporosis. You may have this done starting at age 60.  Mammogram. This may be done every 1-2 years. Talk to your health care provider about how often you should have regular mammograms. Talk with your health care provider about your test results, treatment options, and if necessary, the need for more tests. Vaccines  Your health care provider may recommend certain  vaccines, such as:  Influenza vaccine. This is recommended every year.  Tetanus, diphtheria, and acellular pertussis (Tdap, Td) vaccine. You may need a Td booster every 10 years.  Zoster vaccine. You may need this after age 72.  Pneumococcal 13-valent  conjugate (PCV13) vaccine. One dose is recommended after age 72.  Pneumococcal polysaccharide (PPSV23) vaccine. One dose is recommended after age 72. Talk to your health care provider about which screenings and vaccines you need and how often you need them. This information is not intended to replace advice given to you by your health care provider. Make sure you discuss any questions you have with your health care provider. Document Released: 08/30/2015 Document Revised: 04/22/2016 Document Reviewed: 06/04/2015 Elsevier Interactive Patient Education  2017 [place] Prevention in the Home Falls can cause injuries. They can happen to people of all ages. There are many things you can do to make your home safe and to help prevent falls. What can I do on the outside of my home?  Regularly fix the edges of walkways and driveways and fix any cracks.  Remove anything that might make you trip as you walk through a door, such as a raised step or threshold.  Trim any bushes or trees on the path to your home.  Use bright outdoor lighting.  Clear any walking paths of anything that might make someone trip, such as rocks or tools.  Regularly check to see if handrails are loose or broken. Make sure that both sides of any steps have handrails.  Any raised decks and porches should have guardrails on the edges.  Have any leaves, snow, or ice cleared regularly.  Use sand or salt on walking paths during winter.  Clean up any spills in your garage right away. This includes oil or grease spills. What can I do in the bathroom?  Use night lights.  Install grab bars by the toilet and in the tub and shower. Do not use towel bars as grab bars.  Use non-skid mats or decals in the tub or shower.  If you need to sit down in the shower, use a plastic, non-slip stool.  Keep the floor dry. Clean up any water that spills on the floor as soon as it happens.  Remove soap buildup in the tub or  shower regularly.  Attach bath mats securely with double-sided non-slip rug tape.  Do not have throw rugs and other things on the floor that can make you trip. What can I do in the bedroom?  Use night lights.  Make sure that you have a light by your bed that is easy to reach.  Do not use any sheets or blankets that are too big for your bed. They should not hang down onto the floor.  Have a firm chair that has side arms. You can use this for support while you get dressed.  Do not have throw rugs and other things on the floor that can make you trip. What can I do in the kitchen?  Clean up any spills right away.  Avoid walking on wet floors.  Keep items that you use a lot in easy-to-reach places.  If you need to reach something above you, use a strong step stool that has a grab bar.  Keep electrical cords out of the way.  Do not use floor polish or wax that makes floors slippery. If you must use wax, use non-skid floor wax.  Do not have throw rugs and other things  on the floor that can make you trip. What can I do with my stairs?  Do not leave any items on the stairs.  Make sure that there are handrails on both sides of the stairs and use them. Fix handrails that are broken or loose. Make sure that handrails are as long as the stairways.  Check any carpeting to make sure that it is firmly attached to the stairs. Fix any carpet that is loose or worn.  Avoid having throw rugs at the top or bottom of the stairs. If you do have throw rugs, attach them to the floor with carpet tape.  Make sure that you have a light switch at the top of the stairs and the bottom of the stairs. If you do not have them, ask someone to add them for you. What else can I do to help prevent falls?  Wear shoes that:  Do not have high heels.  Have rubber bottoms.  Are comfortable and fit you well.  Are closed at the toe. Do not wear sandals.  If you use a stepladder:  Make sure that it is fully  opened. Do not climb a closed stepladder.  Make sure that both sides of the stepladder are locked into place.  Ask someone to hold it for you, if possible.  Clearly mark and make sure that you can see:  Any grab bars or handrails.  First and last steps.  Where the edge of each step is.  Use tools that help you move around (mobility aids) if they are needed. These include:  Canes.  Walkers.  Scooters.  Crutches.  Turn on the lights when you go into a dark area. Replace any light bulbs as soon as they burn out.  Set up your furniture so you have a clear path. Avoid moving your furniture around.  If any of your floors are uneven, fix them.  If there are any pets around you, be aware of where they are.  Review your medicines with your doctor. Some medicines can make you feel dizzy. This can increase your chance of falling. Ask your doctor what other things that you can do to help prevent falls. This information is not intended to replace advice given to you by your health care provider. Make sure you discuss any questions you have with your health care provider. Document Released: 05/30/2009 Document Revised: 01/09/2016 Document Reviewed: 09/07/2014 Elsevier Interactive Patient Education  2017 [employer].

## 2019-11-29 NOTE — Patient Instructions (Signed)
Health Maintenance After Age 72 After age 72, you are at a higher risk for certain long-term diseases and infections as well as injuries from falls. Falls are a major cause of broken bones and head injuries in people who are older than age 72. Getting regular preventive care can help to keep you healthy and well. Preventive care includes getting regular testing and making lifestyle changes as recommended by your health care provider. Talk with your health care provider about:  Which screenings and tests you should have. A screening is a test that checks for a disease when you have no symptoms.  A diet and exercise plan that is right for you. What should I know about screenings and tests to prevent falls? Screening and testing are the best ways to find a health problem early. Early diagnosis and treatment give you the best chance of managing medical conditions that are common after age 72. Certain conditions and lifestyle choices may make you more likely to have a fall. Your health care provider may recommend:  Regular vision checks. Poor vision and conditions such as cataracts can make you more likely to have a fall. If you wear glasses, make sure to get your prescription updated if your vision changes.  Medicine review. Work with your health care provider to regularly review all of the medicines you are taking, including over-the-counter medicines. Ask your health care provider about any side effects that may make you more likely to have a fall. Tell your health care provider if any medicines that you take make you feel dizzy or sleepy.  Osteoporosis screening. Osteoporosis is a condition that causes the bones to get weaker. This can make the bones weak and cause them to break more easily.  Blood pressure screening. Blood pressure changes and medicines to control blood pressure can make you feel dizzy.  Strength and balance checks. Your health care provider may recommend certain tests to check your  strength and balance while standing, walking, or changing positions.  Foot health exam. Foot pain and numbness, as well as not wearing proper footwear, can make you more likely to have a fall.  Depression screening. You may be more likely to have a fall if you have a fear of falling, feel emotionally low, or feel unable to do activities that you used to do.  Alcohol use screening. Using too much alcohol can affect your balance and may make you more likely to have a fall. What actions can I take to lower my risk of falls? General instructions  Talk with your health care provider about your risks for falling. Tell your health care provider if: ? You fall. Be sure to tell your health care provider about all falls, even ones that seem minor. ? You feel dizzy, sleepy, or off-balance.  Take over-the-counter and prescription medicines only as told by your health care provider. These include any supplements.  Eat a healthy diet and maintain a healthy weight. A healthy diet includes low-fat dairy products, low-fat (lean) meats, and fiber from whole grains, beans, and lots of fruits and vegetables. Home safety  Remove any tripping hazards, such as rugs, cords, and clutter.  Install safety equipment such as grab bars in bathrooms and safety rails on stairs.  Keep rooms and walkways well-lit. Activity   Follow a regular exercise program to stay fit. This will help you maintain your balance. Ask your health care provider what types of exercise are appropriate for you.  If you need a cane or   walker, use it as recommended by your health care provider.  Wear supportive shoes that have nonskid soles. Lifestyle  Do not drink alcohol if your health care provider tells you not to drink.  If you drink alcohol, limit how much you have: ? 0-1 drink a day for women. ? 0-2 drinks a day for men.  Be aware of how much alcohol is in your drink. In the [place] one drink equals one typical bottle of beer (12  oz), one-half glass of wine (5 oz), or one shot of hard liquor (1 oz).  Do not use any products that contain nicotine or tobacco, such as cigarettes and e-cigarettes. If you need help quitting, ask your health care provider. Summary  Having a healthy lifestyle and getting preventive care can help to protect your health and wellness after age 53.  Screening and testing are the best way to find a health problem early and help you avoid having a fall. Early diagnosis and treatment give you the best chance for managing medical conditions that are more common for people who are older than age 29.  Falls are a major cause of broken bones and head injuries in people who are older than age 31. Take precautions to prevent a fall at home.  Work with your health care provider to learn what changes you can make to improve your health and wellness and to prevent falls. This information is not intended to replace advice given to you by your health care provider. Make sure you discuss any questions you have with your health care provider. Document Revised: 11/24/2018 Document Reviewed: 06/16/2017 Elsevier Patient Education  [place].    Atrial Fibrillation  Atrial fibrillation is a type of heartbeat that is irregular or fast. If you have this condition, your heart beats without any order. This makes it hard for your heart to pump blood in a normal way. Atrial fibrillation may come and go, or it may become a long-lasting problem. If this condition is not treated, it can put you at higher risk for stroke, heart failure, and other heart problems. What are the causes? This condition may be caused by diseases that damage the heart. They include:  High blood pressure.  Heart failure.  Heart valve disease.  Heart surgery. Other causes include:  Diabetes.  Thyroid disease.  Being overweight.  Kidney disease. Sometimes the cause is not known. What increases the risk? You are more likely  to develop this condition if:  You are older.  You smoke.  You exercise often and very hard.  You have a family history of this condition.  You are a man.  You use drugs.  You drink a lot of alcohol.  You have lung conditions, such as emphysema, pneumonia, or COPD.  You have sleep apnea. What are the signs or symptoms? Common symptoms of this condition include:  A feeling that your heart is beating very fast.  Chest pain or discomfort.  Feeling short of breath.  Suddenly feeling light-headed or weak.  Getting tired easily during activity.  Fainting.  Sweating. In some cases, there are no symptoms. How is this treated? Treatment for this condition depends on underlying conditions and how you feel when you have atrial fibrillation. They include:  Medicines to: ? Prevent blood clots. ? Treat heart rate or heart rhythm problems.  Using devices, such as a pacemaker, to correct heart rhythm problems.  Doing surgery to remove the part of the heart that sends bad signals.  Closing an area where clots can form in the heart (left atrial appendage). In some cases, your doctor will treat other underlying conditions. Follow these instructions at home: Medicines  Take over-the-counter and prescription medicines only as told by your doctor.  Do not take any new medicines without first talking to your doctor.  If you are taking blood thinners: ? Talk with your doctor before you take any medicines that have aspirin or NSAIDs, such as ibuprofen, in them. ? Take your medicine exactly as told by your doctor. Take it at the same time each day. ? Avoid activities that could hurt or bruise you. Follow instructions about how to prevent falls. ? Wear a bracelet that says you are taking blood thinners. Or, carry a card that lists what medicines you take. Lifestyle      Do not use any products that have nicotine or tobacco in them. These include cigarettes, e-cigarettes, and  chewing tobacco. If you need help quitting, ask your doctor.  Eat heart-healthy foods. Talk with your doctor about the right eating plan for you.  Exercise regularly as told by your doctor.  Do not drink alcohol.  Lose weight if you are overweight.  Do not use drugs, including cannabis. General instructions  If you have a condition that causes breathing to stop for a short period of time (apnea), treat it as told by your doctor.  Keep a healthy weight. Do not use diet pills unless your doctor says they are safe for you. Diet pills may make heart problems worse.  Keep all follow-up visits as told by your doctor. This is important. Contact a doctor if:  You notice a change in the speed, rhythm, or strength of your heartbeat.  You are taking a blood-thinning medicine and you get more bruising.  You get tired more easily when you move or exercise.  You have a sudden change in weight. Get help right away if:   You have pain in your chest or your belly (abdomen).  You have trouble breathing.  You have side effects of blood thinners, such as blood in your vomit, poop (stool), or pee (urine), or bleeding that cannot stop.  You have any signs of a stroke. "BE FAST" is an easy way to remember the main warning signs: ? B - Balance. Signs are dizziness, sudden trouble walking, or loss of balance. ? E - Eyes. Signs are trouble seeing or a change in how you see. ? F - Face. Signs are sudden weakness or loss of feeling in the face, or the face or eyelid drooping on one side. ? A - Arms. Signs are weakness or loss of feeling in an arm. This happens suddenly and usually on one side of the body. ? S - Speech. Signs are sudden trouble speaking, slurred speech, or trouble understanding what people say. ? T - Time. Time to call emergency services. Write down what time symptoms started.  You have other signs of a stroke, such as: ? A sudden, very bad headache with no known cause. ? Feeling  like you may vomit (nausea). ? Vomiting. ? A seizure. These symptoms may be an emergency. Do not wait to see if the symptoms will go away. Get medical help right away. Call your local emergency services (911 in the [place] Do not drive yourself to the hospital. Summary  Atrial fibrillation is a type of heartbeat that is irregular or fast.  You are at higher risk of this condition if you smoke,  are older, have diabetes, or are overweight.  Follow your doctor's instructions about medicines, diet, exercise, and follow-up visits.  Get help right away if you have signs or symptoms of a stroke.  Get help right away if you cannot catch your breath, or you have chest pain or discomfort. This information is not intended to replace advice given to you by your health care provider. Make sure you discuss any questions you have with your health care provider. Document Revised: 01/25/2019 Document Reviewed: 01/25/2019 Elsevier Patient Education  [place].

## 2019-11-29 NOTE — Telephone Encounter (Signed)
Patient was advised that she is to take her metoprolol in the morning and check her pulse for the next couple of days and if it over 80 she is to take her evening dose. Se also needs to contact her cardiologist per [name] DNP,FNP-BC. Tyler Deis

## 2019-11-29 NOTE — Addendum Note (Signed)
Addended by: [name] F on: 11/29/2019 05:17 PM   Modules accepted: Orders

## 2019-11-29 NOTE — Progress Notes (Signed)
This visit occurred during the SARS-CoV-2 public health emergency.  Safety protocols were in place, including screening questions prior to the visit, additional usage of staff PPE, and extensive cleaning of exam room while observing appropriate contact time as indicated for disinfecting solutions.  Subjective:   Sarah Gilbert is a 74 y.o. female who presents for Medicare Annual (Subsequent) preventive examination.  Review of Systems:  n/a Cardiac Risk Factors include: advanced age (>92men, >86 women);hypertension;obesity (BMI >30kg/m2);sedentary lifestyle     Objective:     Vitals: BP 132/80 (BP Location: Left Arm, Patient Position: Sitting)   Pulse 71   Temp 98.3 F (36.8 C) (Oral)   Ht 5' 3.6" (1.615 m)   Wt 232 lb 12.8 oz (105.6 kg)   BMI 40.46 kg/m   Body mass index is 40.46 kg/m.  Advanced Directives 11/29/2019 09/29/2018 06/06/2015 04/18/2015 03/25/2015  Does Patient Have a Medical Advance Directive? No No No No Yes  Type of Advance Directive - - - - [place]  Would patient like information on creating a medical advance directive? Yes (MAU/Ambulatory/Procedural Areas - Information given) Yes (MAU/Ambulatory/Procedural Areas - Information given) Yes - Educational materials given No - patient declined information -    Tobacco Social History   Tobacco Use  Smoking Status Never Smoker  Smokeless Tobacco Never Used     Counseling given: Not Answered   Clinical Intake:  Pre-visit preparation completed: Yes  Pain : No/denies pain     Nutritional Status: BMI > 30  Obese Nutritional Risks: None Diabetes: No  How often do you need to have someone help you when you read instructions, pamphlets, or other written materials from your doctor or pharmacy?: 1 - Never What is the last grade level you completed in school?: 12th grade  Interpreter Needed?: No  Information entered by :: NAllen LPN  Past Medical History:  Diagnosis Date  . Anemia   .  Arthritis   . Atrial fibrillation ([place])   . Bleeding nose    history of   . ESRD (end stage renal disease) ([place])   . History of bronchitis   . Hyperlipemia   . Hypertension   . Joint inflammation   . Obesity   . PONV (postoperative nausea and vomiting)   . Rotator cuff tear arthropathy of right shoulder 06/18/2015  . Thyroid disease   . Wears glasses    Past Surgical History:  Procedure Laterality Date  . ABDOMINAL HYSTERECTOMY    . APPENDECTOMY    . CARDIAC CATHETERIZATION N/A 04/18/2015   Procedure: Left Heart Cath and Coronary Angiography;  Surgeon: Charolette Forward, MD;  Location: [facility];  Service: Cardiovascular;  Laterality: N/A;  . CHOLECYSTECTOMY    . COLONOSCOPY    . KIDNEY TRANSPLANT     june 2011-[facility]  . TONSILLECTOMY    . TOTAL SHOULDER ARTHROPLASTY Right 06/18/2015   Procedure: RIGHT TOTAL SHOULDER ARTHROPLASTY;  Surgeon: Marchia Bond, MD;  Location: [facility];  Service: Orthopedics;  Laterality: Right;   Family History  Problem Relation Age of Onset  . Dementia Mother   . Cancer Father    Social History   Socioeconomic History  . Marital status: Single    Spouse name: Not on file  . Number of children: Not on file  . Years of education: Not on file  . Highest education level: Not on file  Occupational History  . Occupation: retired  Tobacco Use  . Smoking status: Never Smoker  . Smokeless  tobacco: Never Used  Substance and Sexual Activity  . Alcohol use: No  . Drug use: No  . Sexual activity: Not Currently  Other Topics Concern  . Not on file  Social History Narrative  . Not on file   Social Determinants of Health   Financial Resource Strain: Low Risk   . Difficulty of Paying Living Expenses: Not hard at all  Food Insecurity: No Food Insecurity  . Worried About Charity fundraiser in the Last Year: Never true  . Ran Out of Food in the Last Year: Never true  Transportation Needs: No Transportation Needs  . Lack of Transportation  (Medical): No  . Lack of Transportation (Non-Medical): No  Physical Activity: Inactive  . Days of Exercise per Week: 0 days  . Minutes of Exercise per Session: 0 min  Stress: No Stress Concern Present  . Feeling of Stress : Not at all  Social Connections:   . Frequency of Communication with Friends and Family:   . Frequency of Social Gatherings with Friends and Family:   . Attends Religious Services:   . Active Member of Clubs or Organizations:   . Attends Archivist Meetings:   Marland Kitchen Marital Status:     Outpatient Encounter Medications as of 11/29/2019  Medication Sig  . amLODipine (NORVASC) 10 MG tablet Take 5 mg by mouth daily.   Marland Kitchen atorvastatin (LIPITOR) 20 MG tablet Take 20 mg by mouth daily.   Marland Kitchen b complex-vitamin c-folic acid (NEPHRO-VITE) 0.8 MG TABS Take 0.8 mg by mouth at bedtime.  . furosemide (LASIX) 40 MG tablet Take 40 mg by mouth daily.   . magnesium gluconate (MAGONATE) 500 MG tablet Take 500 mg by mouth 2 (two) times daily.  . metoprolol (LOPRESSOR) 50 MG tablet Take 50 mg by mouth 2 (two) times daily.  . mycophenolate (CELLCEPT) 250 MG capsule Take 500 mg by mouth 2 (two) times daily.  . pantoprazole (PROTONIX) 40 MG tablet   . potassium chloride SA (K-DUR,KLOR-CON) 20 MEQ tablet Take 20 mEq by mouth daily.   . rivaroxaban (XARELTO) 20 MG TABS tablet Take 20 mg by mouth daily with supper.  . tacrolimus (PROGRAF) 0.5 MG capsule Take 0.5 mg by mouth 2 (two) times daily. Take one capsule along with the 1mg  capsule  . tacrolimus (PROGRAF) 1 MG capsule Take 2 mg by mouth 2 (two) times daily.    No facility-administered encounter medications on file as of 11/29/2019.    Activities of Daily Living In your present state of health, do you have any difficulty performing the following activities: 11/29/2019 11/16/2019  Hearing? N N  Vision? N N  Difficulty concentrating or making decisions? Y N  Comment forgetfulness -  Walking or climbing stairs? Y N  Comment a little  -  Dressing or bathing? N N  Doing errands, shopping? N N  Preparing Food and eating ? N -  Using the Toilet? N -  In the past six months, have you accidently leaked urine? N -  Do you have problems with loss of bowel control? N -  Managing your Medications? N -  Managing your Finances? N -  Housekeeping or managing your Housekeeping? N -  Some recent data might be hidden    Patient Care Team: [name], FNP as PCP - General (General Practice)    Assessment:   This is a routine wellness examination for Sarah Gilbert.  Exercise Activities and Dietary recommendations Current Exercise Habits: The patient does not participate  in regular exercise at present  Goals    . Patient Stated (pt-stated)     Wants to maintain good health    . Weight (lb) < 200 lb (90.7 kg) (pt-stated)    . Weight (lb) < 200 lb (90.7 kg)     11/29/2019, wants to weigh 175-200 pounds       Fall Risk Fall Risk  11/29/2019 11/16/2019 12/29/2018 09/29/2018 03/25/2015  Falls in the past year? 0 0 0 0 No  Risk for fall due to : Medication side effect - - Medication side effect -  Follow up Falls evaluation completed;Education provided;Falls prevention discussed - - - -   Is the patient's home free of loose throw rugs in walkways, pet beds, electrical cords, etc?   yes      Grab bars in the bathroom? no      Handrails on the stairs?   n/a      Adequate lighting?   yes  Timed Get Up and Go performed: n/a  Depression Screen PHQ 2/9 Scores 11/29/2019 11/16/2019 12/29/2018 09/29/2018  PHQ - 2 Score 0 0 0 0  PHQ- 9 Score - - - 0     Cognitive Function     6CIT Screen 11/29/2019 09/29/2018  What Year? 0 points 0 points  What month? 0 points 0 points  What time? 0 points 0 points  Count back from 20 0 points 0 points  Months in reverse 0 points 0 points  Repeat phrase 4 points 0 points  Total Score 4 0    Immunization History  Administered Date(s) Administered  . Influenza,inj,Quad PF,6+ Mos 06/20/2015  . PFIZER  SARS-COV-2 Vaccination 10/13/2019, 11/07/2019    Qualifies for Shingles Vaccine? yes  Screening Tests Health Maintenance  Topic Date Due  . PNA vac Low Risk Adult (1 of 2 - PCV13) 11/28/2020 (Originally 06/05/2013)  . INFLUENZA VACCINE  03/17/2020  . MAMMOGRAM  01/31/2021  . COLONOSCOPY  09/09/2021  . TETANUS/TDAP  06/04/2024  . DEXA SCAN  Completed  . Hepatitis C Screening  Completed    Cancer Screenings: Lung: Low Dose CT Chest recommended if Age 44-80 years, 30 pack-year currently smoking OR have quit w/in 15years. Patient does not qualify. Breast:  Up to date on Mammogram? Yes   Up to date of Bone Density/Dexa? Yes Colorectal: up to date  Additional Screenings: : Hepatitis C Screening: 09/29/2018     Plan:    Patient wants to weigh 175-200 pounds.   I have personally reviewed and noted the following in the patient's chart:   . Medical and social history . Use of alcohol, tobacco or illicit drugs  . Current medications and supplements . Functional ability and status . Nutritional status . Physical activity . Advanced directives . List of other physicians . Hospitalizations, surgeries, and ER visits in previous 12 months . Vitals . Screenings to include cognitive, depression, and falls . Referrals and appointments  In addition, I have reviewed and discussed with patient certain preventive protocols, quality metrics, and best practice recommendations. A written personalized care plan for preventive services as well as general preventive health recommendations were provided to patient.     Kellie Simmering, LPN  12/03/3788

## 2019-11-30 NOTE — Addendum Note (Signed)
Addended by: [name] F on: 11/30/2019 03:52 PM   Modules accepted: Orders

## 2019-12-01 LAB — URINE CULTURE

## 2019-12-03 ENCOUNTER — Encounter: Payer: Self-pay | Admitting: Nurse Practitioner

## 2019-12-04 ENCOUNTER — Other Ambulatory Visit: Payer: Self-pay | Admitting: Nurse Practitioner

## 2019-12-04 DIAGNOSIS — N390 Urinary tract infection, site not specified: Secondary | ICD-10-CM

## 2019-12-04 MED ORDER — NITROFURANTOIN MONOHYD MACRO 100 MG PO CAPS: 100.0000 mg | ORAL_CAPSULE | Freq: Two times a day (BID) | ORAL | 0 refills | Status: AC

## 2019-12-07 ENCOUNTER — Ambulatory Visit
Admission: RE | Admit: 2019-12-07 | Discharge: 2019-12-07 | Disposition: A | Discharge status: Home / Self Care | Payer: Medicare Other | Source: Ambulatory Visit | Attending: Nurse Practitioner | Admitting: Nurse Practitioner

## 2019-12-07 DIAGNOSIS — R1901 Right upper quadrant abdominal swelling, mass and lump: Secondary | ICD-10-CM

## 2020-01-07 ENCOUNTER — Inpatient Hospital Stay (HOSPITAL_COMMUNITY): Payer: Medicare Other | Admitting: Certified Registered"

## 2020-01-07 ENCOUNTER — Emergency Department (HOSPITAL_COMMUNITY): Payer: Medicare Other

## 2020-01-07 ENCOUNTER — Other Ambulatory Visit: Payer: Self-pay

## 2020-01-07 ENCOUNTER — Inpatient Hospital Stay (HOSPITAL_COMMUNITY)
Admission: EM | Admit: 2020-01-07 | Discharge: 2020-01-09 | DRG: 024 | Disposition: A | Discharge status: Home Health | Payer: Medicare Other | Attending: Neurology | Admitting: Neurology

## 2020-01-07 ENCOUNTER — Encounter (HOSPITAL_COMMUNITY): Payer: Self-pay | Admitting: Student in an Organized Health Care Education/Training Program

## 2020-01-07 ENCOUNTER — Inpatient Hospital Stay (HOSPITAL_COMMUNITY): Payer: Medicare Other

## 2020-01-07 ENCOUNTER — Encounter (HOSPITAL_COMMUNITY)
Admission: EM | Disposition: A | Discharge status: Home Health | Payer: Self-pay | Source: Home / Self Care | Attending: Neurology

## 2020-01-07 DIAGNOSIS — Z940 Kidney transplant status: Secondary | ICD-10-CM | POA: Diagnosis not present

## 2020-01-07 DIAGNOSIS — E785 Hyperlipidemia, unspecified: Secondary | ICD-10-CM | POA: Diagnosis present

## 2020-01-07 DIAGNOSIS — G460 Middle cerebral artery syndrome: Secondary | ICD-10-CM | POA: Diagnosis not present

## 2020-01-07 DIAGNOSIS — R471 Dysarthria and anarthria: Secondary | ICD-10-CM | POA: Diagnosis present

## 2020-01-07 DIAGNOSIS — I4891 Unspecified atrial fibrillation: Secondary | ICD-10-CM | POA: Diagnosis present

## 2020-01-07 DIAGNOSIS — I361 Nonrheumatic tricuspid (valve) insufficiency: Secondary | ICD-10-CM | POA: Diagnosis not present

## 2020-01-07 DIAGNOSIS — I639 Cerebral infarction, unspecified: Secondary | ICD-10-CM | POA: Diagnosis present

## 2020-01-07 DIAGNOSIS — I120 Hypertensive chronic kidney disease with stage 5 chronic kidney disease or end stage renal disease: Secondary | ICD-10-CM | POA: Diagnosis present

## 2020-01-07 DIAGNOSIS — R29716 NIHSS score 16: Secondary | ICD-10-CM | POA: Diagnosis not present

## 2020-01-07 DIAGNOSIS — Z90710 Acquired absence of both cervix and uterus: Secondary | ICD-10-CM

## 2020-01-07 DIAGNOSIS — E782 Mixed hyperlipidemia: Secondary | ICD-10-CM | POA: Diagnosis present

## 2020-01-07 DIAGNOSIS — I6521 Occlusion and stenosis of right carotid artery: Secondary | ICD-10-CM | POA: Diagnosis present

## 2020-01-07 DIAGNOSIS — M1990 Unspecified osteoarthritis, unspecified site: Secondary | ICD-10-CM | POA: Diagnosis present

## 2020-01-07 DIAGNOSIS — I672 Cerebral atherosclerosis: Secondary | ICD-10-CM | POA: Diagnosis present

## 2020-01-07 DIAGNOSIS — G8190 Hemiplegia, unspecified affecting unspecified side: Secondary | ICD-10-CM | POA: Diagnosis not present

## 2020-01-07 DIAGNOSIS — E876 Hypokalemia: Secondary | ICD-10-CM | POA: Diagnosis present

## 2020-01-07 DIAGNOSIS — Z6838 Body mass index (BMI) 38.0-38.9, adult: Secondary | ICD-10-CM

## 2020-01-07 DIAGNOSIS — R531 Weakness: Secondary | ICD-10-CM | POA: Diagnosis present

## 2020-01-07 DIAGNOSIS — R29810 Facial weakness: Secondary | ICD-10-CM | POA: Diagnosis present

## 2020-01-07 DIAGNOSIS — D689 Coagulation defect, unspecified: Secondary | ICD-10-CM | POA: Diagnosis not present

## 2020-01-07 DIAGNOSIS — Z20822 Contact with and (suspected) exposure to covid-19: Secondary | ICD-10-CM | POA: Diagnosis present

## 2020-01-07 DIAGNOSIS — H53462 Homonymous bilateral field defects, left side: Secondary | ICD-10-CM | POA: Diagnosis not present

## 2020-01-07 DIAGNOSIS — Z809 Family history of malignant neoplasm, unspecified: Secondary | ICD-10-CM

## 2020-01-07 DIAGNOSIS — I63411 Cerebral infarction due to embolism of right middle cerebral artery: Principal | ICD-10-CM | POA: Diagnosis present

## 2020-01-07 DIAGNOSIS — I10 Essential (primary) hypertension: Secondary | ICD-10-CM | POA: Diagnosis present

## 2020-01-07 DIAGNOSIS — D849 Immunodeficiency, unspecified: Secondary | ICD-10-CM | POA: Diagnosis present

## 2020-01-07 DIAGNOSIS — Z79899 Other long term (current) drug therapy: Secondary | ICD-10-CM

## 2020-01-07 DIAGNOSIS — Z7901 Long term (current) use of anticoagulants: Secondary | ICD-10-CM

## 2020-01-07 DIAGNOSIS — E669 Obesity, unspecified: Secondary | ICD-10-CM | POA: Diagnosis present

## 2020-01-07 HISTORY — PX: IR US GUIDE VASC ACCESS RIGHT: IMG2390

## 2020-01-07 HISTORY — PX: IR CT HEAD LTD: IMG2386

## 2020-01-07 HISTORY — PX: RADIOLOGY WITH ANESTHESIA: SHX6223

## 2020-01-07 HISTORY — PX: IR PERCUTANEOUS ART THROMBECTOMY/INFUSION INTRACRANIAL INC DIAG ANGIO: IMG6087

## 2020-01-07 LAB — DIFFERENTIAL
Abs Immature Granulocytes: 0.04 10*3/uL (ref 0.00–0.07)
Basophils Absolute: 0 10*3/uL (ref 0.0–0.1)
Basophils Relative: 1 %
Eosinophils Absolute: 0.2 10*3/uL (ref 0.0–0.5)
Eosinophils Relative: 3 %
Immature Granulocytes: 1 %
Lymphocytes Relative: 25 %
Lymphs Abs: 1.6 10*3/uL (ref 0.7–4.0)
Monocytes Absolute: 0.5 10*3/uL (ref 0.1–1.0)
Monocytes Relative: 8 %
Neutro Abs: 4.1 10*3/uL (ref 1.7–7.7)
Neutrophils Relative %: 62 %

## 2020-01-07 LAB — CBC
HCT: 42 % (ref 36.0–46.0)
Hemoglobin: 13.8 g/dL (ref 12.0–15.0)
MCH: 30.7 pg (ref 26.0–34.0)
MCHC: 32.9 g/dL (ref 30.0–36.0)
MCV: 93.3 fL (ref 80.0–100.0)
Platelets: 222 10*3/uL (ref 150–400)
RBC: 4.5 MIL/uL (ref 3.87–5.11)
RDW: 12.4 % (ref 11.5–15.5)
WBC: 6.5 10*3/uL (ref 4.0–10.5)
nRBC: 0 % (ref 0.0–0.2)

## 2020-01-07 LAB — MRSA PCR SCREENING: MRSA by PCR: NEGATIVE

## 2020-01-07 LAB — COMPREHENSIVE METABOLIC PANEL
ALT: 41 U/L (ref 0–44)
AST: 55 U/L — ABNORMAL HIGH (ref 15–41)
Albumin: 3.6 g/dL (ref 3.5–5.0)
Alkaline Phosphatase: 103 U/L (ref 38–126)
Anion gap: 13 (ref 5–15)
BUN: 10 mg/dL (ref 8–23)
CO2: 23 mmol/L (ref 22–32)
Calcium: 9.5 mg/dL (ref 8.9–10.3)
Chloride: 106 mmol/L (ref 98–111)
Creatinine, Ser: 0.97 mg/dL (ref 0.44–1.00)
GFR calc Af Amer: >60 mL/min (ref >60)
GFR calc non Af Amer: 59 mL/min — ABNORMAL LOW (ref >60)
Glucose, Bld: 169 mg/dL — ABNORMAL HIGH (ref 70–99)
Potassium: 3.3 mmol/L — ABNORMAL LOW (ref 3.5–5.1)
Sodium: 142 mmol/L (ref 135–145)
Total Bilirubin: 0.8 mg/dL (ref 0.3–1.2)
Total Protein: 7.7 g/dL (ref 6.5–8.1)

## 2020-01-07 LAB — I-STAT CHEM 8, ED
BUN: 12 mg/dL (ref 8–23)
Calcium, Ion: 1.13 mmol/L — ABNORMAL LOW (ref 1.15–1.40)
Chloride: 105 mmol/L (ref 98–111)
Creatinine, Ser: 0.8 mg/dL (ref 0.44–1.00)
Glucose, Bld: 166 mg/dL — ABNORMAL HIGH (ref 70–99)
HCT: 44 % (ref 36.0–46.0)
Hemoglobin: 15 g/dL (ref 12.0–15.0)
Potassium: 3.4 mmol/L — ABNORMAL LOW (ref 3.5–5.1)
Sodium: 141 mmol/L (ref 135–145)
TCO2: 29 mmol/L (ref 22–32)

## 2020-01-07 LAB — PROTIME-INR
INR: 1.3 — ABNORMAL HIGH (ref 0.8–1.2)
Prothrombin Time: 15.9 seconds — ABNORMAL HIGH (ref 11.4–15.2)

## 2020-01-07 LAB — CBG MONITORING, ED: Glucose-Capillary: 150 mg/dL — ABNORMAL HIGH (ref 70–99)

## 2020-01-07 LAB — SARS CORONAVIRUS 2 BY RT PCR (HOSPITAL ORDER, PERFORMED IN ~~LOC~~ HOSPITAL LAB): SARS Coronavirus 2: NEGATIVE

## 2020-01-07 LAB — APTT: aPTT: 30 seconds (ref 24–36)

## 2020-01-07 SURGERY — IR WITH ANESTHESIA
Anesthesia: General

## 2020-01-07 MED ORDER — IOHEXOL 300 MG/ML  SOLN
50.0000 mL | Freq: Once | INTRAMUSCULAR | Status: AC | PRN
  Administered 2020-01-07: 20 mL via INTRA_ARTERIAL

## 2020-01-07 MED ORDER — ROCURONIUM BROMIDE 10 MG/ML (PF) SYRINGE
PREFILLED_SYRINGE | INTRAVENOUS | Status: DC | PRN
  Administered 2020-01-07: 60 mg via INTRAVENOUS

## 2020-01-07 MED ORDER — ACETAMINOPHEN 650 MG RE SUPP: 650.0000 mg | RECTAL | Status: DC | PRN

## 2020-01-07 MED ORDER — SODIUM CHLORIDE 0.9 % IV BOLUS
500.0000 mL | Freq: Once | INTRAVENOUS | Status: AC
  Administered 2020-01-07: 500 mL via INTRAVENOUS

## 2020-01-07 MED ORDER — ONDANSETRON HCL 4 MG/2ML IJ SOLN
INTRAMUSCULAR | Status: DC | PRN
  Administered 2020-01-07: 4 mg via INTRAVENOUS

## 2020-01-07 MED ORDER — SUGAMMADEX SODIUM 200 MG/2ML IV SOLN
INTRAVENOUS | Status: DC | PRN
Start: 2020-01-07 — End: 2020-01-07
  Administered 2020-01-07: 400 mg via INTRAVENOUS

## 2020-01-07 MED ORDER — PROPOFOL 10 MG/ML IV BOLUS
INTRAVENOUS | Status: DC | PRN
  Administered 2020-01-07: 120 mg via INTRAVENOUS

## 2020-01-07 MED ORDER — ACETAMINOPHEN 325 MG PO TABS: 650.0000 mg | ORAL_TABLET | ORAL | Status: DC | PRN

## 2020-01-07 MED ORDER — SODIUM CHLORIDE 0.9% FLUSH: 3.0000 mL | Freq: Once | INTRAVENOUS | Status: DC

## 2020-01-07 MED ORDER — HYDRALAZINE HCL 20 MG/ML IJ SOLN: 10.0000 mg | INTRAMUSCULAR | Status: DC | PRN

## 2020-01-07 MED ORDER — TACROLIMUS 1 MG PO CAPS
2.0000 mg | ORAL_CAPSULE | Freq: Every day | ORAL | Status: DC
  Administered 2020-01-07 – 2020-01-09 (×3): 2 mg via ORAL
  Filled 2020-01-07 (×3): qty 2

## 2020-01-07 MED ORDER — SENNOSIDES-DOCUSATE SODIUM 8.6-50 MG PO TABS: 1.0000 | ORAL_TABLET | Freq: Every evening | ORAL | Status: DC | PRN

## 2020-01-07 MED ORDER — SODIUM CHLORIDE 0.9 % IV SOLN: INTRAVENOUS | Status: DC

## 2020-01-07 MED ORDER — TACROLIMUS 1 MG PO CAPS
1.5000 mg | ORAL_CAPSULE | Freq: Every day | ORAL | Status: DC
  Administered 2020-01-07 – 2020-01-08 (×2): 1.5 mg via ORAL
  Filled 2020-01-07 (×4): qty 1

## 2020-01-07 MED ORDER — GLYCOPYRROLATE 0.2 MG/ML IJ SOLN
INTRAMUSCULAR | Status: DC | PRN
  Administered 2020-01-07: .2 mg via INTRAVENOUS

## 2020-01-07 MED ORDER — ORAL CARE MOUTH RINSE: 15.0000 mL | Freq: Two times a day (BID) | OROMUCOSAL | Status: DC

## 2020-01-07 MED ORDER — STROKE: EARLY STAGES OF RECOVERY BOOK
Freq: Once | Status: DC
  Filled 2020-01-07: qty 1

## 2020-01-07 MED ORDER — ACETAMINOPHEN 160 MG/5ML PO SOLN: 650.0000 mg | ORAL | Status: DC | PRN

## 2020-01-07 MED ORDER — LABETALOL HCL 5 MG/ML IV SOLN: 10.0000 mg | INTRAVENOUS | Status: DC | PRN

## 2020-01-07 MED ORDER — CLEVIDIPINE BUTYRATE 0.5 MG/ML IV EMUL: 0.0000 mg/h | INTRAVENOUS | Status: DC

## 2020-01-07 MED ORDER — IOHEXOL 300 MG/ML  SOLN
150.0000 mL | Freq: Once | INTRAMUSCULAR | Status: AC | PRN
  Administered 2020-01-07: 45 mL via INTRA_ARTERIAL

## 2020-01-07 MED ORDER — LABETALOL HCL 5 MG/ML IV SOLN
INTRAVENOUS | Status: DC | PRN
  Administered 2020-01-07: 10 mg via INTRAVENOUS

## 2020-01-07 MED ORDER — CHLORHEXIDINE GLUCONATE CLOTH 2 % EX PADS
6.0000 | MEDICATED_PAD | Freq: Every day | CUTANEOUS | Status: DC
  Administered 2020-01-08 – 2020-01-09 (×2): 6 via TOPICAL

## 2020-01-07 MED ORDER — POTASSIUM CHLORIDE 10 MEQ/100ML IV SOLN
10.0000 meq | INTRAVENOUS | Status: AC
  Administered 2020-01-07 (×4): 10 meq via INTRAVENOUS
  Filled 2020-01-07 (×4): qty 100

## 2020-01-07 MED ORDER — PHENYLEPHRINE HCL-NACL 10-0.9 MG/250ML-% IV SOLN
INTRAVENOUS | Status: DC | PRN
  Administered 2020-01-07: 50 ug/min via INTRAVENOUS

## 2020-01-07 MED ORDER — MYCOPHENOLATE MOFETIL 250 MG PO CAPS
500.0000 mg | ORAL_CAPSULE | Freq: Two times a day (BID) | ORAL | Status: DC
  Administered 2020-01-07 – 2020-01-09 (×4): 500 mg via ORAL
  Filled 2020-01-07 (×6): qty 2

## 2020-01-07 MED ORDER — LIDOCAINE 2% (20 MG/ML) 5 ML SYRINGE
INTRAMUSCULAR | Status: DC | PRN
  Administered 2020-01-07: 40 mg via INTRAVENOUS

## 2020-01-07 MED ORDER — CHLORHEXIDINE GLUCONATE 0.12 % MT SOLN: 15.0000 mL | Freq: Two times a day (BID) | OROMUCOSAL | Status: DC

## 2020-01-07 MED ORDER — SODIUM CHLORIDE 0.9 % IV SOLN: INTRAVENOUS | Status: DC | PRN

## 2020-01-07 NOTE — Sedation Documentation (Signed)
Spoke with Audelia Acton in pt placement to confirm 4N bed request. Spoke with Anderson Malta, RN, 4N charge. Pt to be admitted to 4N20. Requested full O2 tank to be placed on bed. Pt transport called and requested to bring 4N20 to IR2.

## 2020-01-07 NOTE — Progress Notes (Signed)
NeuroInterventional Radiology  Pre-Procedure Note  History: 74 yo female presents to [facility] with symptoms of left sided weakness and dysarthria.  Family recognized symptoms this morning.  Last known well was last night 2100.    She walks with a cane, but is otherwise taking care of herself and her uncle, with whom she lives.   She is taking xarelto for atrial fibrillation.   Baseline mRS: 1 NIHSS:  16  CT ASPECTS: 10 CTA:   Not performed, given high suspicion and history of renal transplanty     Case discussed with Dr. Rory Percy of stroke neurology.  Advanced imaging might include CTA & CTP, vs MRI/MRA, as the patient has history of renal transplant.  Given the patient's symptoms, imaging findings on non-contrast CT, CT-clinical mismatch, and baseline function, I agree they are an appropriate candidate for mechanical thrombectomy, and proceeding with angio and attempt at mechanical thrombectomy is indicated.    The risks and benefits of the procedure were discussed with the patient and her family, with specific risks including: bleeding, infection, arterial injury/dissection, contrast reaction, kidney injury, need for further procedure/surgery, neurologic deficit, 10-15% risk of intracranial hemorrhage, cardiopulmonary collapse, death. All questions were answered.  The patient demonstrated appropriate understanding and signed her own consent.  Both the family and the patient would like to proceed with attempt at thrombectomy.   Plan for cerebral angiogram and attempt at mechanical thrombectomy.   Signed,   Dulcy Fanny. Earleen Newport, DO

## 2020-01-07 NOTE — ED Triage Notes (Signed)
Patient presents to ED via [facility]  From home states she was last seen normal 9 pm last night, her uncle of whom she lives with found her on the floor this am. Patient has a gaze to the right with left arm and leg flaccid, patient is alert oriented and able to answer question appropriately.

## 2020-01-07 NOTE — Anesthesia Preprocedure Evaluation (Signed)
Anesthesia Evaluation  Patient identified by MRN, date of birth, ID band Patient confused    Reviewed: Patient's Chart, lab work & pertinent test resultsPreop documentation limited or incomplete due to emergent nature of procedure.  Airway Mallampati: II  TM Distance: >3 FB     Dental  (+) Edentulous Upper, Edentulous Lower   Pulmonary    breath sounds clear to auscultation       Cardiovascular hypertension,  Rhythm:Irregular Rate:Normal     Neuro/Psych    GI/Hepatic   Endo/Other    Renal/GU      Musculoskeletal   Abdominal   Peds  Hematology   Anesthesia Other Findings   Reproductive/Obstetrics                             Anesthesia Physical Anesthesia Plan  ASA: IV and emergent  Anesthesia Plan: General   Post-op Pain Management:    Induction: Intravenous  PONV Risk Score and Plan: Ondansetron and Dexamethasone  Airway Management Planned: Oral ETT  Additional Equipment:   Intra-op Plan:   Post-operative Plan: Extubation in OR  Informed Consent: I have reviewed the patients History and Physical, chart, labs and discussed the procedure including the risks, benefits and alternatives for the proposed anesthesia with the patient or authorized representative who has indicated his/her understanding and acceptance.       Plan Discussed with: CRNA and Anesthesiologist  Anesthesia Plan Comments:         Anesthesia Quick Evaluation

## 2020-01-07 NOTE — ED Provider Notes (Signed)
[facility] EMERGENCY DEPARTMENT Provider Note   CSN: [id number] Arrival date & time: 01/07/20  3295  An emergency department physician performed an initial assessment on this suspected stroke patient at 0957.  History Chief Complaint  Patient presents with  . Code Stroke    Sarah Gilbert is a 74 y.o. female.  Patient is a 74 year old female with extensive past medical history including end-stage renal disease formally on dialysis, now status post renal transplant on antirejection medications.  She presents today for evaluation of strokelike symptoms.  According to EMS, patient was found by her husband on the floor with weakness in her left arm, left leg, and left-sided facial droop.  She was also displaying slurred speech.  From what I am told, she was found at approximately 9 AM with last time seen normal the night before when going to bed.  Patient takes Xarelto and reports last dose yesterday morning.  The history is provided by the patient.       Past Medical History:  Diagnosis Date  . Anemia   . Arthritis   . Atrial fibrillation ([place])   . Bleeding nose    history of   . ESRD (end stage renal disease) ([place])   . History of bronchitis   . Hyperlipemia   . Hypertension   . Joint inflammation   . Obesity   . PONV (postoperative nausea and vomiting)   . Rotator cuff tear arthropathy of right shoulder 06/18/2015  . Thyroid disease   . Wears glasses     Patient Active Problem List   Diagnosis Date Noted  . Acute ischemic stroke ([place]) 01/07/2020  . Abnormal glucose 12/29/2018  . Mixed hyperlipidemia 09/29/2018  . Hematuria 09/29/2018  . Lipoma of back 09/29/2018  . Rotator cuff tear arthropathy of right shoulder 06/18/2015  . S/p reverse total shoulder arthroplasty 06/18/2015  . A-fib ([place]) 05/01/2015  . Chronic anticoagulation 05/01/2015  . Right foot pain 03/25/2015  . Shingles (herpes zoster) polyneuropathy 02/06/2014  . Constipation  01/27/2012  . Hypercholesteremia 01/27/2012  . Hypertension, essential 01/27/2012  . Immunosuppression ([place]) 01/27/2012  . Obesity 01/27/2012    Past Surgical History:  Procedure Laterality Date  . ABDOMINAL HYSTERECTOMY    . APPENDECTOMY    . CARDIAC CATHETERIZATION N/A 04/18/2015   Procedure: Left Heart Cath and Coronary Angiography;  Surgeon: Charolette Forward, MD;  Location: [facility];  Service: Cardiovascular;  Laterality: N/A;  . CHOLECYSTECTOMY    . COLONOSCOPY    . KIDNEY TRANSPLANT     june 2011-[facility]  . TONSILLECTOMY    . TOTAL SHOULDER ARTHROPLASTY Right 06/18/2015   Procedure: RIGHT TOTAL SHOULDER ARTHROPLASTY;  Surgeon: Marchia Bond, MD;  Location: [facility];  Service: Orthopedics;  Laterality: Right;     OB History   No obstetric history on file.     Family History  Problem Relation Age of Onset  . Dementia Mother   . Cancer Father     Social History   Tobacco Use  . Smoking status: Never Smoker  . Smokeless tobacco: Never Used  Substance Use Topics  . Alcohol use: No  . Drug use: No    Home Medications Prior to Admission medications   Medication Sig Start Date End Date Taking? Authorizing Provider  amLODipine (NORVASC) 10 MG tablet Take 5 mg by mouth daily.  08/03/18   [provider]  atorvastatin (LIPITOR) 20 MG tablet Take 20 mg by mouth daily.  02/05/15   [provider]  b complex-vitamin c-folic acid (NEPHRO-VITE) 0.8 MG TABS Take 0.8 mg by mouth at bedtime.    [provider]  furosemide (LASIX) 40 MG tablet Take 40 mg by mouth daily.     [provider]  magnesium gluconate (MAGONATE) 500 MG tablet Take 500 mg by mouth 2 (two) times daily.    [provider]  metoprolol (LOPRESSOR) 50 MG tablet Take 50 mg by mouth 2 (two) times daily.    [provider]  mycophenolate (CELLCEPT) 250 MG capsule Take 500 mg by mouth 2 (two) times daily.    [provider]  nystatin-triamcinolone  ointment (MYCOLOG) Apply 1 application topically 2 (two) times daily. 11/29/19   [name], FNP  pantoprazole (PROTONIX) 40 MG tablet  08/16/18   [provider]  potassium chloride SA (K-DUR,KLOR-CON) 20 MEQ tablet Take 20 mEq by mouth daily.     [provider]  rivaroxaban (XARELTO) 20 MG TABS tablet Take 20 mg by mouth daily with supper.    [provider]  tacrolimus (PROGRAF) 0.5 MG capsule Take 0.5 mg by mouth 2 (two) times daily. Take one capsule along with the 1mg  capsule    [provider]  tacrolimus (PROGRAF) 1 MG capsule Take 2 mg by mouth 2 (two) times daily.     [provider]    Allergies    Vancomycin and Iohexol  Review of Systems   Review of Systems  All other systems reviewed and are negative.   Physical Exam Updated Vital Signs BP (!) 141/123 (BP Location: Right Arm)   Pulse 66   Resp 20   Ht 5\' 8"  (1.727 m)   Wt 99.8 kg   SpO2 94%   BMI 33.45 kg/m   Physical Exam Vitals and nursing note reviewed.  Constitutional:      General: She is not in acute distress.    Appearance: She is well-developed. She is not diaphoretic.  HENT:     Head: Normocephalic and atraumatic.  Cardiovascular:     Rate and Rhythm: Normal rate and regular rhythm.     Heart sounds: No murmur. No friction rub. No gallop.   Pulmonary:     Effort: Pulmonary effort is normal. No respiratory distress.     Breath sounds: Normal breath sounds. No wheezing.  Abdominal:     General: Bowel sounds are normal. There is no distension.     Palpations: Abdomen is soft.     Tenderness: There is no abdominal tenderness.  Musculoskeletal:        General: Normal range of motion.     Cervical back: Normal range of motion and neck supple.  Skin:    General: Skin is warm and dry.  Neurological:     Mental Status: She is alert and oriented to person, place, and time.     Comments: Patient awake and alert.  She does have left arm and leg weakness as  well as left-sided facial droop.  She also is displaying neglect and stairs to the left.     ED Results / Procedures / Treatments   Labs (all labs ordered are listed, but only abnormal results are displayed) Labs Reviewed  PROTIME-INR - Abnormal; Notable for the following components:      Result Value   Prothrombin Time 15.9 (*)    INR 1.3 (*)    All other components within normal limits  COMPREHENSIVE METABOLIC PANEL - Abnormal; Notable for the following components:  Potassium 3.3 (*)    Glucose, Bld 169 (*)    AST 55 (*)    GFR calc non Af Amer 59 (*)    All other components within normal limits  I-STAT CHEM 8, ED - Abnormal; Notable for the following components:   Potassium 3.4 (*)    Glucose, Bld 166 (*)    Calcium, Ion 1.13 (*)    All other components within normal limits  CBG MONITORING, ED - Abnormal; Notable for the following components:   Glucose-Capillary 150 (*)    All other components within normal limits  SARS CORONAVIRUS 2 BY RT PCR (HOSPITAL ORDER, [facility])  APTT  CBC  DIFFERENTIAL    EKG None  Radiology CT HEAD CODE STROKE WO CONTRAST  Result Date: 01/07/2020 CLINICAL DATA:  Code stroke.  Right MCA syndrome EXAM: CT HEAD WITHOUT CONTRAST TECHNIQUE: Contiguous axial images were obtained from the base of the skull through the vertex without intravenous contrast. COMPARISON:  01/28/2013 FINDINGS: Brain: No evidence of acute infarction, hemorrhage, hydrocephalus, extra-axial collection or mass lesion/mass effect. Vascular: Suspect hyperdense right supraclinoid ICA. Skull: Negative for fracture or bone lesion Sinuses/Orbits: Right cataract resection.  Gaze to the right. Other: These results were called by telephone at the time of interpretation on 01/07/2020 at 10:14 am to provider Rory Percy, who verbally acknowledged these results. ASPECTS [facility] Stroke Program Early CT Score) - Ganglionic level infarction (caudate, lentiform nuclei,  internal capsule, insula, M1-M3 cortex): 7 - Supraganglionic infarction (M4-M6 cortex): 3 Total score (0-10 with 10 being normal): 10 IMPRESSION: 1. Concern for hyperdense right supraclinoid ICA. 2. No intracranial hemorrhage.  ASPECTS is 10. Electronically Signed   By: Monte Fantasia M.D.   On: 01/07/2020 10:15    Procedures Procedures (including critical care time)  Medications Ordered in ED Medications  sodium chloride flush (NS) 0.9 % injection 3 mL (has no administration in time range)   stroke: mapping our early stages of recovery book (has no administration in time range)  0.9 %  sodium chloride infusion (has no administration in time range)  acetaminophen (TYLENOL) tablet 650 mg (has no administration in time range)    Or  acetaminophen (TYLENOL) 160 MG/5ML solution 650 mg (has no administration in time range)    Or  acetaminophen (TYLENOL) suppository 650 mg (has no administration in time range)  senna-docusate (Senokot-S) tablet 1 tablet (has no administration in time range)    ED Course  I have reviewed the triage vital signs and the nursing notes.  Pertinent labs & imaging results that were available during my care of the patient were reviewed by me and considered in my medical decision making (see chart for details).    MDM Rules/Calculators/A&P  Patient arrives here as a code stroke with evidence for LVO.  She was seen immediately by myself and Dr. Rory Percy from neurology upon arrival and went immediately to radiology for a head CT.  This was read as negative.  Due to the timing and nature of her symptoms, patient will go to IR for possible intervention.  To be admitted by neurology.  CRITICAL CARE Performed by: Veryl Speak Total critical care time: 35 minutes Critical care time was exclusive of separately billable procedures and treating other patients. Critical care was necessary to treat or prevent imminent or life-threatening deterioration. Critical care was time  spent personally by me on the following activities: development of treatment plan with patient and/or surrogate as well as nursing, discussions with  consultants, evaluation of patient's response to treatment, examination of patient, obtaining history from patient or surrogate, ordering and performing treatments and interventions, ordering and review of laboratory studies, ordering and review of radiographic studies, pulse oximetry and re-evaluation of patient's condition.   Final Clinical Impression(s) / ED Diagnoses Final diagnoses:  Stroke [facility])  Acute ischemic stroke [facility])    Rx / DC Orders ED Discharge Orders    None       Veryl Speak, MD 01/07/20 1205

## 2020-01-07 NOTE — H&P (Addendum)
NEURO HOSPITALIST  H&P     Chief Complaint:  Left side weakness  History obtained from:  Patient   HPI:                                                                                                                                         Sarah Gilbert is an 74 y.o. female double kidney transplant (2011), HTN, . A fib (on xarelto), HLD who presented as a code stroke. For left side weakness.  She is coming from home where she lives with her uncle. Patient went to bed last night and was her usual self @ 2100 01/06/20. She woke up this morning and was noted by family to have right gaze and left facial weakness as well as slurred speech.  EMS were called-evaluate the patient-EMS activated code stroke-LVO positive.  At baseline, patient walks with cane.   Denies CP, SOB, HA, nausea vomiting fevers chills or exposure to anybody with Covid. Patient does take xarelto for her A. fib, daily-was last taken yesterday morning sometime.  Her examination was consistent with large vessel occlusion stroke involving the right hemisphere. Stat head CT-aspects 10. Given her double kidney transplant history, decision was made to take her directly for diagnostic cerebral angiogram and possible thrombectomy to have the least amount of contrast exposure. ED course:  CTH: no hemorrhage 120/70 BG: 167 tPA Given: No: on xarelto Modified Rankin: Rankin Score=1 NIHSS:16     Past Medical History:  Diagnosis Date  . Anemia   . Arthritis   . Atrial fibrillation ([place])   . Bleeding nose    history of   . ESRD (end stage renal disease) ([place])   . History of bronchitis   . Hyperlipemia   . Hypertension   . Joint inflammation   . Obesity   . PONV (postoperative nausea and vomiting)   . Rotator cuff tear arthropathy of right shoulder 06/18/2015  . Thyroid disease   . Wears glasses     Past Surgical History:  Procedure Laterality Date  . ABDOMINAL HYSTERECTOMY     . APPENDECTOMY    . CARDIAC CATHETERIZATION N/A 04/18/2015   Procedure: Left Heart Cath and Coronary Angiography;  Surgeon: Charolette Forward, MD;  Location: [facility];  Service: Cardiovascular;  Laterality: N/A;  . CHOLECYSTECTOMY    . COLONOSCOPY    . KIDNEY TRANSPLANT     june 2011-[facility]  . TONSILLECTOMY    . TOTAL SHOULDER ARTHROPLASTY Right 06/18/2015   Procedure: RIGHT TOTAL SHOULDER ARTHROPLASTY;  Surgeon: Marchia Bond, MD;  Location: [facility];  Service: Orthopedics;  Laterality: Right;  Family History  Problem Relation Age of Onset  . Dementia Mother   . Cancer Father          Social History:  reports that she has never smoked. She has never used smokeless tobacco. She reports that she does not drink alcohol or use drugs.  Allergies:  Allergies  Allergen Reactions  . Vancomycin Hives, Itching, Rash and Other (See Comments)    Blistering, swelling, and peeling of feet.  Pt denies any similar reactions around face or neck.  . Iohexol      Desc: iodine contrast/per dialysis center, Onset Date: [id number]     Medications:                                                                                                                           Current Facility-Administered Medications  Medication Dose Route Frequency Provider Last Rate Last Admin  . sodium chloride flush (NS) 0.9 % injection 3 mL  3 mL Intravenous Once Veryl Speak, MD       Current Outpatient Medications  Medication Sig Dispense Refill  . amLODipine (NORVASC) 10 MG tablet Take 5 mg by mouth daily.     Marland Kitchen atorvastatin (LIPITOR) 20 MG tablet Take 20 mg by mouth daily.     Marland Kitchen b complex-vitamin c-folic acid (NEPHRO-VITE) 0.8 MG TABS Take 0.8 mg by mouth at bedtime.    . furosemide (LASIX) 40 MG tablet Take 40 mg by mouth daily.     . magnesium gluconate (MAGONATE) 500 MG tablet Take 500 mg by mouth 2 (two) times daily.    . metoprolol (LOPRESSOR) 50 MG tablet Take 50 mg by mouth 2 (two) times daily.     . mycophenolate (CELLCEPT) 250 MG capsule Take 500 mg by mouth 2 (two) times daily.    Marland Kitchen nystatin-triamcinolone ointment (MYCOLOG) Apply 1 application topically 2 (two) times daily. 30 g 0  . pantoprazole (PROTONIX) 40 MG tablet     . potassium chloride SA (K-DUR,KLOR-CON) 20 MEQ tablet Take 20 mEq by mouth daily.     . rivaroxaban (XARELTO) 20 MG TABS tablet Take 20 mg by mouth daily with supper.    . tacrolimus (PROGRAF) 0.5 MG capsule Take 0.5 mg by mouth 2 (two) times daily. Take one capsule along with the 17m capsule    . tacrolimus (PROGRAF) 1 MG capsule Take 2 mg by mouth 2 (two) times daily.     '   ROS:  ROS was performed and is negative except as noted in HPI    General Examination:                                                                                                      There were no vitals taken for this visit.  Physical Exam  Constitutional: Appears well-developed and well-nourished.  Psych: Affect appropriate to situation Eyes: Normal external eye and conjunctiva. HENT: Normocephalic, no lesions, without obvious abnormality.   Musculoskeletal-no joint tenderness, deformity or swelling Cardiovascular: Normal rate and regular rhythm.  Respiratory: Effort normal, non-labored breathing saturations WNL GI: Soft.  No distension. There is no tenderness.  Skin: WDI  Neurological Examination Mental Status: Alert, oriented, thought content appropriate. Naming and repetition intact.  Speech fluent without evidence of aphasia. Slight dysarthria. Able to follow  commands without difficulty. Cranial Nerves: right forced gaze, comp;ete hemianopia  IV, VI: ptosis not present, extra-ocular motions intact bilaterally, pupils equal, round, reactive to light and accommodation, smile asymmetric, left facial droop, hearing normal  bilaterally  Motor: Right : Upper extremity   5/5 Left:     Upper extremity   2/5  Lower extremity   5/5  Lower extremity   2/5 Tone and bulk:normal tone throughout; no atrophy noted Sensory: neglecting left side with DSS. Intact to light touch Cerebellar: No ataxia noted on right side Gait: deferred   Lab Results: Basic Metabolic Panel: Recent Labs  Lab 01/07/20 1021  NA 141  K 3.4*  CL 105  GLUCOSE 166*  BUN 12  CREATININE 0.80    CBC: Recent Labs  Lab 01/07/20 1013 01/07/20 1021  WBC 6.5  --   NEUTROABS 4.1  --   HGB 13.8 15.0  HCT 42.0 44.0  MCV 93.3  --   PLT 222  --      CBG: Recent Labs  Lab 01/07/20 0956  GLUCAP 150*    Imaging: CT HEAD CODE STROKE WO CONTRAST  Result Date: 01/07/2020 CLINICAL DATA:  Code stroke.  Right MCA syndrome EXAM: CT HEAD WITHOUT CONTRAST TECHNIQUE: Contiguous axial images were obtained from the base of the skull through the vertex without intravenous contrast. COMPARISON:  01/28/2013 FINDINGS: Brain: No evidence of acute infarction, hemorrhage, hydrocephalus, extra-axial collection or mass lesion/mass effect. Vascular: Suspect hyperdense right supraclinoid ICA. Skull: Negative for fracture or bone lesion Sinuses/Orbits: Right cataract resection.  Gaze to the right. Other: These results were called by telephone at the time of interpretation on 01/07/2020 at 10:14 am to provider Rory Percy, who verbally acknowledged these results. ASPECTS [facility] Stroke Program Early CT Score) - Ganglionic level infarction (caudate, lentiform nuclei, internal capsule, insula, M1-M3 cortex): 7 - Supraganglionic infarction (M4-M6 cortex): 3 Total score (0-10 with 10 being normal): 10 IMPRESSION: 1. Concern for hyperdense right supraclinoid ICA. 2. No intracranial hemorrhage.  ASPECTS is 10. Electronically Signed   By: Monte Fantasia M.D.   On: 01/07/2020 10:15       Laurey Morale, MSN, NP-C [facility] [phone]  01/07/2020, 10:01 AM    Assessment: 74 y.o. female 74 y.o. female  double kidney transplant (2011), HTN, . A fib (on xarelto), HLD who presented as a code stroke.  Patient is not a candidate for TPA d/t being on xarelto. NIHSS: 16. CTH did now show a hemorrhage, but Concern for hyperdense right supraclinoid ICA. ASPECTS 10. Patient taken to IR for thrombectomy. Decision was made between Dr. Jacqualyn Posey and Dr.Raymonda Pell to not obtain CTA/CTP d/t patient's history of double kidney transplant and not wanting to overload patient with contrast dye.  Stroke Risk Factors - atrial fibrillation, hyperlipidemia and hypertension   Plan per attending physician:  Delays in process: obtaining IV access and attempting to reach family for collateral history.  Also discussion with internationalist about skipping the CTA/CTP given double renal transplant history and good ASPECTS .   --Please page the Stroke team from 8am-4pm.   You can look them up on [email or web address]    Attending addendum Patient seen and examined as an acute code stroke at the request of Dr. Stark Jock.  Activated in the field by EMS. HPI: Came in via EMS from home for complaints of left facial droop, left-sided weakness, rightward gaze and slurred speech. Has a history of atrial fibrillation-on Xarelto.  Last dose yesterday morning.  Also has a history of double renal transplant with renal function test from April showing normal creatinine. Went to bed normal at 9 PM on 01/06/2020 and woke up this morning with the deficits. Family noticed a deficits, called EMS, brought to the ER, as a code stroke LVO positive. She is able to provide consistent history.  At baseline, uses a cane only for some balance support but otherwise is independent in terms of walking, ADLs including bathing cleaning cooking etc. Examined in the ER-symptoms consistent with LVO. Stat CT head negative for acute process.  No bleed.  Aspects 10.  Possible dense right MCA  Imaging reviewed by me personally: CT of  the head-no bleed.  Aspects 10.  Possible dense right MCA.  On examination: She is awake alert oriented x3 Attention concentration is normal Speech is mildly dysarthric No evidence of aphasia-has intact fluency, comprehension, naming and repetition. Cranial nerves: Pupils are round reactive to light, extraocular movement examination shows forced right gaze, there is left homonymous hemianopsia, left lower facial complete weakness, tongue and palate midline. Motor exam: She is barely 2/5 in the left upper and 2/5 in the left lower extremity.  Right side is full strength 5/5-upper and lower extremity. Sensory exam: Diminished on the left but there is profound extinction on double simultaneous stimulation. Coordination: No dysmetria on the right.  Unable to perform the left Gait testing deferred at this time  NIH stroke scale-16.  Labs from today that resulted after the decision for thrombectomy was made and patient taken for the procedure include a CMP that shows mild hypokalemia, hyperglycemia with glucose 169, creatinine 0.97, GFR greater than 60. CBC that shows normal leukocytes, normal hemoglobin and hematocrit as well as normal platelet count.  Assessment: 74 year old with above past medical history presenting with symptoms consistent with a right MCA syndrome, aspects 10 and no bleed. At baseline, she is independent-modified Rankin-1 I discussed with her the possibilities of treatment which include the possibility of mechanical thrombectomy.  She is outside the window for IV TPA. Given her extensive renal history including that of transplant and being on immunosuppression, I discussed this with the interventionalists, Dr. Earleen Newport as well-that the fact that she is now by time 13 hours off of last normal but her aspects is 10 makes  it less likely that the last truly known well was 13 hours ago-since this is a wake-up stroke, possibly it happened closer to waking up then to going to bed.  The  dye load that would be given for CT a head and neck and perfusion would be very high and might be detrimental to her kidneys, and after that if she needs the intervention, there will be additional dye load.  To prevent that, we offered her to go directly for a diagnostic cerebral angio followed by thrombectomy-which she agreed to and we proceeded with that with her consent and with clear description that anybody outside the 6-hour window by standards of care does require CTA and perfusion and this would be something that is being done because of her specific case and renal status.  She was taken in for emergent thrombectomy after this detailed discussion.  Successful revascularization of the right carotid terminus thrombus.    Impression: -Right ICA/MCA stroke-likely cardioembolic-status post emergent thrombectomy with successful TICI3 revascularization. -Acute ischemic stroke due to embolism of the nondominant right middle cerebral artery -Acute ischemic stroke due to right carotid occlusion. -Atrial fibrillation -ESRD patient status post double renal transplant with normal renal function. -Hypertension  Recommendations/plan: -Admit to neuro ICU -Due to revascularization being successful-systolic blood pressure goal should be maintained between 120-140 -Obtain MRI of the brain -Obtain MRA of the head and neck without contrast -No need for Dopplers -2D echo -High intensity statin if LDL greater than 70-obtain A1c and lipid panel -PT OT speech -Blood glucose control with goal between 1 40-1 80 during hospitalization.  Use sliding scale as needed. -Stroke swallow screen-n.p.o. until cleared -Hold home Xarelto for now.  Decision on resumption of anticoagulation will be based on the size of the stroke once MRI is completed.  CNS: -Close neuro monitoring -MRI and stroke work-up as above -PT OT ST.  Might need PMR consult  CVS: -History of essential hypertension-came in normotensive with  labile pressure at times. -Strict blood pressure goals as [id number] systolic due to revascularization-use labetalol/hydralazine as needed and Cleviprex drip if needed for longer -2D echo -Twelve-lead EKG  Respiratory: -Was intubated for the procedure and extubated -Monitor clinically -Chest x-ray  Hematological: -No acute issues.  Check CBC again in the morning -Possible coagulopathy due to home use of Xarelto.  Last dose ~24 hours prior to presentation.  Renal: -Double transplant recipient -Received contrast during diagnosis cerebral angiogram but much less so than what she would have if she had a CTA head and neck and CT perfusion study. -Continue gentle hydration with normal saline 75 cc an hour and check CMP in the morning. -Continue suppressive medications from her transplant  GI: -No active issues -Docusate and senna for bowel -Pantoprazole for GI prophylaxis  Fluids/electrolytes: -hypokalemic-will replace. -Check BMP in the morning and replete as necessary  ID: Possible aspiration pneumonia -NPO -Chest x-ray.  No antibiotics for now   Full code N.p.o. until cleared by bedside swallow evaluation or formal swallow evaluation Dispo: TBD Family: Uncle and another family member in the waiting room-updated by Dr. Earleen Newport.  All of the patient's and family's questions were answered.  Stroke team will continue to follow.  -- Amie Portland, MD [facility] Pager: [phone] If 7pm to 7am, please call on call as listed on AMION.  CRITICAL CARE ATTESTATION Performed by: Amie Portland, MD Total critical care time: 50 minutes Critical care time was exclusive of separately billable procedures and treating other patients and/or supervising APPs/Residents/Students Critical  care was necessary to treat or prevent imminent or life-threatening deterioration due to acute ischemic stroke, atrial fibrillation This patient is critically ill and at significant risk  for neurological worsening and/or death and care requires constant monitoring. Critical care was time spent personally by me on the following activities: development of treatment plan with patient and/or surrogate as well as nursing, discussions with consultants, evaluation of patient's response to treatment, examination of patient, obtaining history from patient or surrogate, ordering and performing treatments and interventions, ordering and review of laboratory studies, ordering and review of radiographic studies, pulse oximetry, re-evaluation of patient's condition, participation in multidisciplinary rounds and medical decision making of high complexity in the care of this patient.  Present on admission: Acute ischemic stroke, hemiparesis, hemiplegia, essential hypertension, possible aspiration pneumonia, ESRD patient now with renal transplant on immunosuppression, coagulopathy secondary to being on Xarelto as an outpatient  Delays in the process were: Initially obtaining a line to try to get the CTA and perfusion while other decisions will be made, no family was reachable to get collateral information-the later arrived and were met in the consultation room and detailed discussion with them was had with Dr. Earleen Newport

## 2020-01-07 NOTE — ED Notes (Signed)
purewick placed on pt and hooked to suction canister.

## 2020-01-07 NOTE — Anesthesia Procedure Notes (Signed)
Arterial Line Insertion Start/End5/23/2021 11:05 AM, 01/07/2020 11:09 AM Performed by: Babs Bertin, CRNA, CRNA  Preanesthetic checklist: patient identified, IV checked, risks and benefits discussed, surgical consent, monitors and equipment checked, pre-op evaluation and timeout performed Left, radial was placed Catheter size: 20 G Hand hygiene performed  and maximum sterile barriers used   Attempts: 1 Procedure performed without using ultrasound guided technique. Ultrasound Notes:anatomy identified Following insertion, dressing applied and Biopatch.

## 2020-01-07 NOTE — Sedation Documentation (Signed)
Pt admitted to room 4N20. SBAR given to [place], Therapist, sports and Mickel Baas, Therapist, sports. All questions answered to satisfaction. Groin and pulses unchanged, see flowsheet.

## 2020-01-07 NOTE — Procedures (Addendum)
Neuro-Interventional Radiology  Post Cerebral Angiogram Procedure Note  History: 74 yo female presents to [facility] with symptoms of left sided weakness and dysarthria.  Family recognized symptoms this morning.  Last known well was last night 2100.    She walks with a cane, but is otherwise taking care of herself and her uncle, with whom she lives.   She is taking xarelto for atrial fibrillation.   Baseline mRS:  1 NIHSS:   16 Last Known Well:  2100 Saturday night.  ASPECTS:   10 Site of occlusion:  Right ICA terminus   Skin Puncture:   11:10  First Pass Time:  11:22  Device:   ADAPT strategy, with Zoom 71 catheter  Result   TICI 2a Second Pass Time:  11:34  Device:   Combo strategy, with Zoom 55 catheter, and solitaire 4x40  Result   TICI 3  IV tPA administered?: no IA Medication:  no  Anesthesia    GETA  Procedure:  - US guided R CFA access - Cerebral Angiogram - Mechanical thrombectomy of ELVO involving right ICA terminus - Deployment of Angioseal for hemostasis - Flat panel CT in NIR suite  Findings:  Start:  Right ICA terminus occlusion. Final: TICI 3 flow  CT: No hemorrhage.  No stain  Complications: None  EBL: 100cc  Recommendations: - right hip straight overnight - Goal SBP 120-140.   - Frequent NV checks - Repeat CT or MRI imaging recommended within 36 hours, discretion of Neurology - NIR to follow  Family: Durene Cal "daughter" [phone] Hubbard Robinson, uncle [phone], [phone]   Signed,  Dulcy Fanny. Earleen Newport, DO

## 2020-01-07 NOTE — Sedation Documentation (Signed)
SBAR called to [facility], Therapist, sports.

## 2020-01-07 NOTE — ED Notes (Signed)
Patient taken to IR 1022 1027am 152/66-80-18-96% ra Dr. Rory Percy at bedside explained procedure to patient and she fully understood. Witness consent signed. 1030 am 147/75 -81-100%-18 denies pain remains a/o 1038 am 132/68-67-20-97% 1040am CRNA at bedside in IR suite report given.  1051 report given to [employer], family here in consult A Dr. Rory Percy and Dr. Jacqualyn Posey spoke with family

## 2020-01-07 NOTE — Anesthesia Procedure Notes (Signed)

## 2020-01-07 NOTE — Sedation Documentation (Signed)
Spoke with [place] in PACU. Will go to floor to recover pt. Will call when on the way with pt.

## 2020-01-07 NOTE — Transfer of Care (Signed)
Immediate Anesthesia Transfer of Care Note  Patient: Sarah Gilbert  Procedure(s) Performed: IR WITH ANESTHESIA (N/A )  Patient Location: ICU  Anesthesia Type:General  Level of Consciousness: awake, alert  and oriented  Airway & Oxygen Therapy: Patient Spontanous Breathing and Patient connected to face mask oxygen  Post-op Assessment: Report given to RN, Post -op Vital signs reviewed and stable and Patient moving all extremities X 4  Post vital signs: Reviewed and stable  Last Vitals:  Vitals Value Taken Time  BP    Temp    Pulse    Resp    SpO2      Last Pain:  Vitals:   01/07/20 1103  PainSc: 0-No pain         Complications: No apparent anesthesia complications

## 2020-01-07 NOTE — Anesthesia Postprocedure Evaluation (Signed)
Anesthesia Post Note  Patient: Sarah Gilbert  Procedure(s) Performed: IR WITH ANESTHESIA (N/A )     Patient location during evaluation: NICU Anesthesia Type: General Level of consciousness: awake and patient cooperative Pain management: pain level controlled Vital Signs Assessment: post-procedure vital signs reviewed and stable Respiratory status: spontaneous breathing, nonlabored ventilation and patient connected to nasal cannula oxygen Cardiovascular status: blood pressure returned to baseline Anesthetic complications: no    Last Vitals:  Vitals:   01/07/20 1430 01/07/20 1500  BP: (!) 112/56 119/79  Pulse: 69 88  Resp: (!) 26 19  Temp:    SpO2: 96% 97%    Last Pain:  Vitals:   01/07/20 1325  TempSrc: Oral  PainSc:                  [name]

## 2020-01-08 ENCOUNTER — Inpatient Hospital Stay (HOSPITAL_COMMUNITY): Payer: Medicare Other

## 2020-01-08 DIAGNOSIS — I639 Cerebral infarction, unspecified: Secondary | ICD-10-CM

## 2020-01-08 DIAGNOSIS — I361 Nonrheumatic tricuspid (valve) insufficiency: Secondary | ICD-10-CM

## 2020-01-08 LAB — LIPID PANEL
Cholesterol: 127 mg/dL (ref 0–200)
HDL: 44 mg/dL (ref >40)
LDL Cholesterol: 74 mg/dL (ref 0–99)
Total CHOL/HDL Ratio: 2.9 RATIO
Triglycerides: 44 mg/dL (ref <150)
VLDL: 9 mg/dL (ref 0–40)

## 2020-01-08 LAB — HEMOGLOBIN A1C
Hgb A1c MFr Bld: 6.1 % — ABNORMAL HIGH (ref 4.8–5.6)
Mean Plasma Glucose: 128.37 mg/dL

## 2020-01-08 LAB — ECHOCARDIOGRAM COMPLETE
Height: 65 in
Weight: 3703.73 oz

## 2020-01-08 MED ORDER — MAGNESIUM GLUCONATE 500 MG PO TABS
500.0000 mg | ORAL_TABLET | Freq: Two times a day (BID) | ORAL | Status: DC
  Administered 2020-01-08 – 2020-01-09 (×2): 500 mg via ORAL
  Filled 2020-01-08 (×5): qty 1

## 2020-01-08 MED ORDER — HYDRALAZINE HCL 20 MG/ML IJ SOLN: 10.0000 mg | INTRAMUSCULAR | Status: DC | PRN

## 2020-01-08 MED ORDER — APIXABAN 5 MG PO TABS
5.0000 mg | ORAL_TABLET | Freq: Two times a day (BID) | ORAL | Status: DC
  Administered 2020-01-08 – 2020-01-09 (×2): 5 mg via ORAL
  Filled 2020-01-08 (×2): qty 1

## 2020-01-08 MED ORDER — ASPIRIN EC 81 MG PO TBEC
81.0000 mg | DELAYED_RELEASE_TABLET | Freq: Every day | ORAL | Status: DC
  Administered 2020-01-08: 81 mg via ORAL
  Filled 2020-01-08: qty 1

## 2020-01-08 MED ORDER — ATORVASTATIN CALCIUM 40 MG PO TABS
40.0000 mg | ORAL_TABLET | Freq: Every day | ORAL | Status: DC
  Administered 2020-01-08 – 2020-01-09 (×2): 40 mg via ORAL
  Filled 2020-01-08 (×2): qty 1

## 2020-01-08 MED ORDER — AMLODIPINE BESYLATE 5 MG PO TABS
5.0000 mg | ORAL_TABLET | Freq: Every day | ORAL | Status: DC
  Administered 2020-01-08 – 2020-01-09 (×2): 5 mg via ORAL
  Filled 2020-01-08 (×2): qty 1

## 2020-01-08 MED ORDER — ENOXAPARIN SODIUM 40 MG/0.4ML ~~LOC~~ SOLN
40.0000 mg | SUBCUTANEOUS | Status: DC
  Administered 2020-01-08: 40 mg via SUBCUTANEOUS
  Filled 2020-01-08: qty 0.4

## 2020-01-08 MED ORDER — FUROSEMIDE 40 MG PO TABS: 40.0000 mg | ORAL_TABLET | Freq: Every day | ORAL | Status: DC

## 2020-01-08 MED ORDER — METOPROLOL TARTRATE 50 MG PO TABS
50.0000 mg | ORAL_TABLET | Freq: Two times a day (BID) | ORAL | Status: DC
  Administered 2020-01-08 – 2020-01-09 (×3): 50 mg via ORAL
  Filled 2020-01-08 (×3): qty 1

## 2020-01-08 MED ORDER — POTASSIUM CHLORIDE CRYS ER 20 MEQ PO TBCR
20.0000 meq | EXTENDED_RELEASE_TABLET | Freq: Every day | ORAL | Status: DC
  Administered 2020-01-08 – 2020-01-09 (×2): 20 meq via ORAL
  Filled 2020-01-08 (×2): qty 1

## 2020-01-08 MED ORDER — PANTOPRAZOLE SODIUM 40 MG PO TBEC
40.0000 mg | DELAYED_RELEASE_TABLET | Freq: Every day | ORAL | Status: DC
  Administered 2020-01-08 – 2020-01-09 (×2): 40 mg via ORAL
  Filled 2020-01-08 (×2): qty 1

## 2020-01-08 MED ORDER — LABETALOL HCL 5 MG/ML IV SOLN: 10.0000 mg | INTRAVENOUS | Status: DC | PRN

## 2020-01-08 MED ORDER — APIXABAN 5 MG PO TABS
5.0000 mg | ORAL_TABLET | Freq: Two times a day (BID) | ORAL | 0 refills | Status: DC
Start: 2020-01-08 — End: 2020-01-08

## 2020-01-08 NOTE — TOC CAGE-AID Note (Addendum)
Transition of Care [facility]) - CAGE-AID Screening   Patient Details  Name: Sarah Gilbert MRN: [id number] Date of Birth: March 13, 1946  Transition of Care [facility]) CM/SW Contact:    Emeterio Reeve, [place] Phone Number: 01/08/2020, 12:04 PM   Clinical Narrative:  Pt denied alcohol and substance use.   CAGE-AID Screening:    Have You Ever Felt You Ought to Cut Down on Your Drinking or Drug Use?: No Have People Annoyed You By Critizing Your Drinking Or Drug Use?: No Have You Felt Bad Or Guilty About Your Drinking Or Drug Use?: No Have You Ever Had a Drink or Used Drugs First Thing In The Morning to STeady Your Nerves or to Get Rid of a Hangover?: No CAGE-AID Score: 0  Substance Abuse Education Offered: No     Blima Ledger, [place] Social Worker [phone]

## 2020-01-08 NOTE — Progress Notes (Signed)
Transitions of Care Pharmacist Note  Patty Leitzke is a 74 y.o. female that has been diagnosed with A Fib and will be prescribed Eliquis (apixaban) at discharge.   Patient Education: I provided the following education on Apixaban 5/24 to the patient: How to take the medication Described what the medication is Signs of bleeding Signs/symptoms of VTE and stroke  Answered their questions  Discharge Medications Plan: The patient wants to have their discharge medications filled by the Transitions of Care pharmacy rather than their usual pharmacy.  The discharge orders pharmacy has been changed to the Transitions of Care pharmacy, the patient will receive a phone call regarding co-pay, and their medications will be delivered by the Transitions of Care pharmacy.    Thank you,   Sherren Kerns, PharmD PGY1 Acute Care Pharmacy Resident Jan 08, 2020

## 2020-01-08 NOTE — Progress Notes (Signed)
Referring Physician(s): Code Stroke- Rory Percy, Ashish  Supervising Physician: Corrie Mckusick  Patient Status:  [facility] - In-pt  Chief Complaint: None  Subjective:  History of acute CVA s/p cerebral arteriogram with emergent mechanical thrombectomy of right ICA terminus occlusion achieving a TICI 3 revascularization via right femoral approach 01/07/2020 by Dr. Earleen Newport. Patient awake and alert sitting in chair with no complaints at this time. Can spontaneously move all extremities. Right groin incision c/d/i.  MRA neck 01/07/2020: 1. Patent cervical carotid and vertebral arteries with no evidence of hemodynamically significant stenosis on noncontrast MRA.  MRA head 01/07/2020: 1. Negative intracranial MRA status post endovascular treatment of right ICA terminus occlusion. 2. Mild intracranial atherosclerosis without stenosis.  MR brain 01/07/2020: 1. Truncated exam by patient request.  DWI, SWI, and FLAIR obtained. 2. Only several small scattered areas of restricted diffusion are identified in the right hemisphere with mild FLAIR hyperintensity. No evidence of hemorrhage or mass effect.   Allergies: Vancomycin and Iohexol  Medications: Prior to Admission medications   Medication Sig Start Date End Date Taking? Authorizing Provider  amLODipine (NORVASC) 10 MG tablet Take 5 mg by mouth daily.  08/03/18  Yes [provider]  atorvastatin (LIPITOR) 20 MG tablet Take 20 mg by mouth daily.  02/05/15  Yes [provider]  b complex-vitamin c-folic acid (NEPHRO-VITE) 0.8 MG TABS Take 0.8 mg by mouth at bedtime.   Yes [provider]  furosemide (LASIX) 40 MG tablet Take 40 mg by mouth daily.    Yes [provider]  magnesium gluconate (MAGONATE) 500 MG tablet Take 500 mg by mouth 2 (two) times daily.   Yes [provider]  metoprolol (LOPRESSOR) 50 MG tablet Take 50 mg by mouth 2 (two) times daily.   Yes [provider]  mycophenolate  (CELLCEPT) 250 MG capsule Take 500 mg by mouth 2 (two) times daily.   Yes [provider]  pantoprazole (PROTONIX) 40 MG tablet Take 40 mg by mouth daily.  08/16/18  Yes [provider]  potassium chloride SA (K-DUR,KLOR-CON) 20 MEQ tablet Take 20 mEq by mouth daily.    Yes [provider]  rivaroxaban (XARELTO) 20 MG TABS tablet Take 20 mg by mouth daily with supper.   Yes [provider]  tacrolimus (PROGRAF) 0.5 MG capsule Take 0.5 mg by mouth 2 (two) times daily. Take one capsule along with the 1mg  capsule   Yes [provider]  tacrolimus (PROGRAF) 1 MG capsule Take 2 mg by mouth 2 (two) times daily.    Yes [provider]  nystatin-triamcinolone ointment (MYCOLOG) Apply 1 application topically 2 (two) times daily. Patient not taking: Reported on 01/07/2020 11/29/19   [name], FNP     Vital Signs: BP (!) 145/92   Pulse 66   Temp 98.2 F (36.8 C)   Resp (!) 26   Ht 5\' 5"  (1.651 m)   Wt 231 lb 7.7 oz (105 kg)   SpO2 98%   BMI 38.52 kg/m   Physical Exam Vitals and nursing note reviewed.  Constitutional:      General: She is not in acute distress.    Appearance: Normal appearance.  Pulmonary:     Effort: Pulmonary effort is normal. No respiratory distress.  Skin:    General: Skin is warm and dry.     Comments: Right groin incision soft without active bleeding or hematoma.  Neurological:     Mental Status: She is alert.  Comments: Alert, awake, and oriented x3. Speech and comprehension intact. PERRL bilaterally. EOMs intact bilaterally without nystagmus or subjective diplopia. No facial asymmetry. Tongue midline. Can spontaneously move all extremities. No pronator drift. Fine motor and coordination intact and symmetric. Distal pulses (DPs) 1+ bilaterally.     Imaging: MR ANGIO HEAD WO CONTRAST  Result Date: 01/07/2020 CLINICAL DATA:  74 year old female code stroke presentation earlier today, right ICA  terminus occlusion treated with endovascular reperfusion. History of renal transplant. EXAM: MRA HEAD WITHOUT CONTRAST TECHNIQUE: Angiographic images of the Circle of Willis were obtained using MRA technique without intravenous contrast. COMPARISON:  Brain MRI and neck MRA today reported separately. FINDINGS: Antegrade flow in the posterior circulation with mildly dominant distal left vertebral artery. No distal vertebral stenosis. Tortuous but patent vertebrobasilar junction. Patent bilateral PICA origins. The basilar artery is tortuous but patent without stenosis. Patent SCA and left PCA origins. There is a fetal type right PCA origin. Bilateral P1/posterior communicating arteries are tortuous. Bilateral PCA branches are within normal limits. Antegrade flow in both ICA siphons. Tortuous siphons with mild irregularity but no significant stenosis. Normal ophthalmic and right posterior communicating artery origins. Patent and symmetric carotid termini. Normal MCA and ACA origins. Mildly tortuous A1 segments. Anterior communicating artery and visible ACA branches are within normal limits. MCA M1 segments and bifurcations are patent without stenosis. Visible bilateral MCA branches are within normal limits. IMPRESSION: Negative intracranial MRA status post endovascular treatment of right ICA terminus occlusion. Mild intracranial atherosclerosis without stenosis. Electronically Signed   By: Genevie Ann M.D.   On: 01/07/2020 19:01   MR ANGIO NECK WO CONTRAST  Result Date: 01/07/2020 CLINICAL DATA:  74 year old female code stroke presentation earlier today, right ICA terminus occlusion treated with endovascular reperfusion. History of renal transplant. EXAM: MRA NECK WITHOUT CONTRAST TECHNIQUE: Angiographic images of the neck were obtained using MRA technique without intravenous contrast. Carotid stenosis measurements (when applicable) are obtained utilizing NASCET criteria, using the distal internal carotid diameter as  the denominator. COMPARISON:  Brain MRI today reported separately. FINDINGS: Time-of-flight images demonstrate a 3 vessel arch configuration. There is antegrade flow in both common carotid arteries. Both carotid bifurcations are patent. Antegrade flow in both cervical ICAs to the skull base. Only mild irregularity at the ICA origins and greater on the left. No hemodynamically significant carotid stenosis identified. Antegrade flow in both cervical vertebral arteries, the left appears dominant. Vertebral artery origins not visible but no V2 or V3 vertebral artery stenosis identified. IMPRESSION: Patent cervical carotid and vertebral arteries with no evidence of hemodynamically significant stenosis on noncontrast MRA. Electronically Signed   By: Genevie Ann M.D.   On: 01/07/2020 18:57   MR BRAIN WO CONTRAST  Result Date: 01/07/2020 CLINICAL DATA:  74 year old female code stroke presentation earlier today, right ICA terminus occlusion treated with endovascular reperfusion. History of renal transplant. EXAM: MRI HEAD WITHOUT CONTRAST TECHNIQUE: Multiplanar, multiecho pulse sequences of the brain and surrounding structures were obtained without intravenous contrast. COMPARISON:  Presentation noncontrast head CT 1004 hours. FINDINGS: The examination had to be discontinued prior to completion by patient request. Axial and coronal DWI, axial SWI, and axial FLAIR imaging only was obtained. DWI reveals only a few small (10 mm or smaller) areas of restricted diffusion in the right hemisphere including portions of the right amygdala (series 7, image 57), posterosuperior right temporal gyrus (series 5, image 78), and punctate cortex of the right frontal operculum. No other restricted diffusion. No midline shift, mass effect, or  evidence of intracranial mass lesion. No ventriculomegaly. No evidence of intracranial hemorrhage. Subtle FLAIR hyperintensity at the areas of abnormal DWI. Mild for age superimposed scattered white  matter FLAIR hyperintensity elsewhere. No chronic cortical encephalomalacia. IMPRESSION: 1. Truncated exam by patient request.  DWI, SWI, and FLAIR obtained. 2. Only several small scattered areas of restricted diffusion are identified in the right hemisphere with mild FLAIR hyperintensity. No evidence of hemorrhage or mass effect. Electronically Signed   By: Genevie Ann M.D.   On: 01/07/2020 18:54   IR CT Head Ltd  Result Date: 01/07/2020 INDICATION: 74 year old female with a history of acute stroke, right MCA occlusion suspected. Patient has a history of renal transplant, with no CT angiogram or perfusion performed. She presents for attempt at mechanical thrombectomy based on clinical CT mismatch. EXAM: ULTRASOUND GUIDED ACCESS RIGHT COMMON FEMORAL ARTERY CERVICAL AND CEREBRAL ANGIOGRAM MECHANICAL THROMBECTOMY RIGHT MCA ANGIO-SEAL FOR HEMOSTASIS COMPARISON:  Head CT 01/07/2020 MEDICATIONS: None ANESTHESIA/SEDATION: The anesthesia team was present to provide general endotracheal tube anesthesia and for patient monitoring during the procedure. Interventional neuro radiology nursing staff was also present. CONTRAST:  65 cc contrast FLUOROSCOPY TIME:  Fluoroscopy Time: 18 minutes 24 seconds (896 mGy). COMPLICATIONS: None TECHNIQUE: Informed written consent was obtained from the patient's family after a thorough discussion of the procedural risks, benefits and alternatives. Specific risks discussed include: Bleeding, infection, contrast reaction, kidney injury/failure, need for further procedure/surgery, arterial injury or dissection, embolization to new territory, intracranial hemorrhage (10-15% risk), neurologic deterioration, cardiopulmonary collapse, death. All questions were addressed. Maximal Sterile Barrier Technique was utilized including during the procedure including caps, mask, sterile gowns, sterile gloves, sterile drape, hand hygiene and skin antiseptic. A timeout was performed prior to the initiation of  the procedure. The anesthesia team was present to provide general endotracheal tube anesthesia and for patient monitoring during the procedure. Interventional neuro radiology nursing staff was also present. FINDINGS: Initial Findings: Right internal carotid artery: Cervical ICA patent to the skull base. The horizontal petrous portion was occluded at the skull base on the initial angiogram. Right MCA:  Occlusion Right ACA:  Occlusion Completion Findings: Right MCA: After treatment, there is restoration of flow through the cervical ICA segment, restoration of flow through the ICA terminus, and complete revascularization of the right MCA territory. TICI 3 Flat panel CT: No contrast staining, and no intraparenchymal hemorrhage or subarachnoid hemorrhage. PROCEDURE: The anesthesia team was present to provide general endotracheal tube anesthesia and for patient monitoring during the procedure. Intubation was performed in negative pressure [place] in neuro IR holding. Interventional neuro radiology nursing staff was also present. Ultrasound survey of the right inguinal region was performed with images stored and sent to PACs. 11 blade scalpel was used to make a small incision. Blunt dissection was performed with US guidance. A micropuncture needle was used access the right common femoral artery under ultrasound. With excellent arterial blood flow returned, an .018 micro wire was passed through the needle, observed to enter the abdominal aorta under fluoroscopy. The needle was removed, and a micropuncture sheath was placed over the wire. The inner dilator and wire were removed, and an 035 wire was advanced under fluoroscopy into the abdominal aorta. The sheath was removed and a 25cm 12F straight vascular sheath was placed. The dilator was removed and the sheath was flushed. Sheath was attached to pressurized and heparinized saline bag for constant forward flow. A coaxial system was then advanced over the 035 wire. This included  a 95cm 087 "Walrus"  balloon guide with coaxial 125cm Berenstein diagnostic catheter. This was advanced to the proximal descending thoracic aorta. Wire was then removed. Double flush of the catheter was performed. Catheter was then used to select the innominate artery. Angiogram was performed. Using roadmap technique, the catheter was advanced over a standard glide wire into right cervical ICA, with distal position achieved of the balloon guide. The diagnostic catheter and the wire were removed. Formal angiogram was performed. Road map function was used once the occluded vessel was identified, with terminus occlusion identified. Copious back flush was performed and the balloon catheter was attached to heparinized and pressurized saline bag for forward flow. A second coaxial system was then advanced through the balloon catheter, which included the selected intermediate catheter, microcatheter, and microwire. In this scenario, the set up included a 71 zoom catheter, a Trevo Provue18 microcatheter, and 014 synchro soft wire. This system was advanced through the balloon guide catheter under the road-map function, with adequate back-flush at the rotating hemostatic valve at that back end of the balloon guide. Microcatheter and the intermediate catheter system were advanced through the terminal ICA into the proximal M1 segment. The aspiration catheter was then advanced into the cavernous segment. The proprietary engine was then attached to the back end of the supra athero. Aspiration was then performed confirming blood return in the cavernous segment. The catheter was slowly advanced through the siphon until blood return halted. At this point the microcatheter and the microwire were removed from the system. Aspiration was performed for 2 minutes and the catheter was withdrawn. Once return of flow occurred at the tip of the catheter, the catheter was removed. Catheter was removed while aspiration was performed at the tip of  the balloon guide. Aspiration catheter was removed from the system, the Touhy was removed, and aspiration was performed at the hub of the balloon guide confirming flow. Repeat angiogram was performed. This confirmed persisting partial occlusion in the M1 segment, with less than 50% reperfusion of the MCA territory. At this point there was restoration of flow into a fetal PCA. Fetal PCA had significant perfusion of the temporal arteries, with leptomeningeal collateralization to the MCA territory. We then elected to perform a second pass. Coaxial system was then advanced through the balloon catheter, which included the selected intermediate catheter, microcatheter, and microwire. In this scenario, the set up included a 55 zoom catheter, a Trevo Provue18 microcatheter, and 014 synchro soft wire. This system was advanced through the balloon guide catheter under the road-map function, with adequate back-flush at the rotating hemostatic valve at that back end of the balloon guide. Microcatheter and the intermediate catheter system were advanced through the terminal ICA into the distal M1 segment. Microwire was then carefully advanced through the occluded segment. Microcatheter was then manipulated through the occluded segment and the wire was removed with saline drip at the hub. Blood was then aspirated through the hub of the microcatheter, and a gentle contrast injection was performed confirming intraluminal position. A rotating hemostatic valve was then attached to the back end of the microcatheter, and a pressurized and heparinized saline bag was attached to the catheter. 4 x 40 solitaire device was then selected. Back flush was achieved at the rotating hemostatic valve, and then the device was gently advanced through the microcatheter to the distal end. The retriever was then unsheathed by withdrawing the microcatheter under fluoroscopy. Once the retriever was completely unsheathed, the microcatheter was carefully  stripped from the delivery device. Control angiogram was performed  from the intermediate catheter. A 3 minute time interval was observed. The balloon at the balloon guide catheter was then inflated under fluoroscopy for proximal flow arrest. Constant aspiration using the proprietary engine was then performed at the intermediate catheter, as the retriever was gently and slowly withdrawn with fluoroscopic observation. Once the retriever was "corked" within the tip of the intermediate catheter, both were removed from the system. Free aspiration was attempted at the hub of the balloon guide catheter, with no free blood return confirmed. The balloon was then deflated, with constant aspiration on the hub of the balloon guide. With no blood return, the balloon guide was slowly withdrawn from the cervical segment, through the aorta, and removed from the 8 French sheath. At this point flush was performed to remove a large embolus from the tip of the walrus balloon guide. JB 1 catheter was then advanced on a Glidewire to the aortic arch. Wire was removed and double flush was performed. JB diagnostic catheter was then used to select the cervical ICA. Angiogram was performed. Restoration of flow was confirmed, with TICI 3 flow. Angiogram of the cervical ICA was performed. Balloon guide was then removed. The skin at the puncture site was then cleaned with Chlorhexidine. The 8 French sheath was removed and an 61F angioseal was deployed. Flat panel CT was performed. Patient was extubated once the CT was reviewed. Patient tolerated the procedure well and remained hemodynamically stable throughout. No complications were encountered and no significant blood loss encountered. IMPRESSION: Status post ultrasound guided access right common femoral artery for cervical and cerebral angiogram, mechanical thrombectomy of a right ICA terminus occlusion, and complete restoration of flow through the MCA territory (TICI 3). PLAN: ICU status  Target systolic blood pressure of 120-140 Right hip straight time 6 hours Frequent neurovascular checks Repeat neurologic imaging with CT and/MRI at the discretion of neurology team Electronically Signed   By: Corrie Mckusick D.O.   On: 01/07/2020 12:41   IR US Guide Vasc Access Right  Result Date: 01/07/2020 INDICATION: 74 year old female with a history of acute stroke, right MCA occlusion suspected. Patient has a history of renal transplant, with no CT angiogram or perfusion performed. She presents for attempt at mechanical thrombectomy based on clinical CT mismatch. EXAM: ULTRASOUND GUIDED ACCESS RIGHT COMMON FEMORAL ARTERY CERVICAL AND CEREBRAL ANGIOGRAM MECHANICAL THROMBECTOMY RIGHT MCA ANGIO-SEAL FOR HEMOSTASIS COMPARISON:  Head CT 01/07/2020 MEDICATIONS: None ANESTHESIA/SEDATION: The anesthesia team was present to provide general endotracheal tube anesthesia and for patient monitoring during the procedure. Interventional neuro radiology nursing staff was also present. CONTRAST:  65 cc contrast FLUOROSCOPY TIME:  Fluoroscopy Time: 18 minutes 24 seconds (896 mGy). COMPLICATIONS: None TECHNIQUE: Informed written consent was obtained from the patient's family after a thorough discussion of the procedural risks, benefits and alternatives. Specific risks discussed include: Bleeding, infection, contrast reaction, kidney injury/failure, need for further procedure/surgery, arterial injury or dissection, embolization to new territory, intracranial hemorrhage (10-15% risk), neurologic deterioration, cardiopulmonary collapse, death. All questions were addressed. Maximal Sterile Barrier Technique was utilized including during the procedure including caps, mask, sterile gowns, sterile gloves, sterile drape, hand hygiene and skin antiseptic. A timeout was performed prior to the initiation of the procedure. The anesthesia team was present to provide general endotracheal tube anesthesia and for patient monitoring during the  procedure. Interventional neuro radiology nursing staff was also present. FINDINGS: Initial Findings: Right internal carotid artery: Cervical ICA patent to the skull base. The horizontal petrous portion was occluded at the skull base  on the initial angiogram. Right MCA:  Occlusion Right ACA:  Occlusion Completion Findings: Right MCA: After treatment, there is restoration of flow through the cervical ICA segment, restoration of flow through the ICA terminus, and complete revascularization of the right MCA territory. TICI 3 Flat panel CT: No contrast staining, and no intraparenchymal hemorrhage or subarachnoid hemorrhage. PROCEDURE: The anesthesia team was present to provide general endotracheal tube anesthesia and for patient monitoring during the procedure. Intubation was performed in negative pressure [place] in neuro IR holding. Interventional neuro radiology nursing staff was also present. Ultrasound survey of the right inguinal region was performed with images stored and sent to PACs. 11 blade scalpel was used to make a small incision. Blunt dissection was performed with US guidance. A micropuncture needle was used access the right common femoral artery under ultrasound. With excellent arterial blood flow returned, an .018 micro wire was passed through the needle, observed to enter the abdominal aorta under fluoroscopy. The needle was removed, and a micropuncture sheath was placed over the wire. The inner dilator and wire were removed, and an 035 wire was advanced under fluoroscopy into the abdominal aorta. The sheath was removed and a 25cm 77F straight vascular sheath was placed. The dilator was removed and the sheath was flushed. Sheath was attached to pressurized and heparinized saline bag for constant forward flow. A coaxial system was then advanced over the 035 wire. This included a 95cm 087 "Walrus" balloon guide with coaxial 125cm Berenstein diagnostic catheter. This was advanced to the proximal descending  thoracic aorta. Wire was then removed. Double flush of the catheter was performed. Catheter was then used to select the innominate artery. Angiogram was performed. Using roadmap technique, the catheter was advanced over a standard glide wire into right cervical ICA, with distal position achieved of the balloon guide. The diagnostic catheter and the wire were removed. Formal angiogram was performed. Road map function was used once the occluded vessel was identified, with terminus occlusion identified. Copious back flush was performed and the balloon catheter was attached to heparinized and pressurized saline bag for forward flow. A second coaxial system was then advanced through the balloon catheter, which included the selected intermediate catheter, microcatheter, and microwire. In this scenario, the set up included a 71 zoom catheter, a Trevo Provue18 microcatheter, and 014 synchro soft wire. This system was advanced through the balloon guide catheter under the road-map function, with adequate back-flush at the rotating hemostatic valve at that back end of the balloon guide. Microcatheter and the intermediate catheter system were advanced through the terminal ICA into the proximal M1 segment. The aspiration catheter was then advanced into the cavernous segment. The proprietary engine was then attached to the back end of the supra athero. Aspiration was then performed confirming blood return in the cavernous segment. The catheter was slowly advanced through the siphon until blood return halted. At this point the microcatheter and the microwire were removed from the system. Aspiration was performed for 2 minutes and the catheter was withdrawn. Once return of flow occurred at the tip of the catheter, the catheter was removed. Catheter was removed while aspiration was performed at the tip of the balloon guide. Aspiration catheter was removed from the system, the Touhy was removed, and aspiration was performed at the  hub of the balloon guide confirming flow. Repeat angiogram was performed. This confirmed persisting partial occlusion in the M1 segment, with less than 50% reperfusion of the MCA territory. At this point there was restoration of  flow into a fetal PCA. Fetal PCA had significant perfusion of the temporal arteries, with leptomeningeal collateralization to the MCA territory. We then elected to perform a second pass. Coaxial system was then advanced through the balloon catheter, which included the selected intermediate catheter, microcatheter, and microwire. In this scenario, the set up included a 55 zoom catheter, a Trevo Provue18 microcatheter, and 014 synchro soft wire. This system was advanced through the balloon guide catheter under the road-map function, with adequate back-flush at the rotating hemostatic valve at that back end of the balloon guide. Microcatheter and the intermediate catheter system were advanced through the terminal ICA into the distal M1 segment. Microwire was then carefully advanced through the occluded segment. Microcatheter was then manipulated through the occluded segment and the wire was removed with saline drip at the hub. Blood was then aspirated through the hub of the microcatheter, and a gentle contrast injection was performed confirming intraluminal position. A rotating hemostatic valve was then attached to the back end of the microcatheter, and a pressurized and heparinized saline bag was attached to the catheter. 4 x 40 solitaire device was then selected. Back flush was achieved at the rotating hemostatic valve, and then the device was gently advanced through the microcatheter to the distal end. The retriever was then unsheathed by withdrawing the microcatheter under fluoroscopy. Once the retriever was completely unsheathed, the microcatheter was carefully stripped from the delivery device. Control angiogram was performed from the intermediate catheter. A 3 minute time interval was  observed. The balloon at the balloon guide catheter was then inflated under fluoroscopy for proximal flow arrest. Constant aspiration using the proprietary engine was then performed at the intermediate catheter, as the retriever was gently and slowly withdrawn with fluoroscopic observation. Once the retriever was "corked" within the tip of the intermediate catheter, both were removed from the system. Free aspiration was attempted at the hub of the balloon guide catheter, with no free blood return confirmed. The balloon was then deflated, with constant aspiration on the hub of the balloon guide. With no blood return, the balloon guide was slowly withdrawn from the cervical segment, through the aorta, and removed from the 8 French sheath. At this point flush was performed to remove a large embolus from the tip of the walrus balloon guide. JB 1 catheter was then advanced on a Glidewire to the aortic arch. Wire was removed and double flush was performed. JB diagnostic catheter was then used to select the cervical ICA. Angiogram was performed. Restoration of flow was confirmed, with TICI 3 flow. Angiogram of the cervical ICA was performed. Balloon guide was then removed. The skin at the puncture site was then cleaned with Chlorhexidine. The 8 French sheath was removed and an 18F angioseal was deployed. Flat panel CT was performed. Patient was extubated once the CT was reviewed. Patient tolerated the procedure well and remained hemodynamically stable throughout. No complications were encountered and no significant blood loss encountered. IMPRESSION: Status post ultrasound guided access right common femoral artery for cervical and cerebral angiogram, mechanical thrombectomy of a right ICA terminus occlusion, and complete restoration of flow through the MCA territory (TICI 3). PLAN: ICU status Target systolic blood pressure of 120-140 Right hip straight time 6 hours Frequent neurovascular checks Repeat neurologic imaging  with CT and/MRI at the discretion of neurology team Electronically Signed   By: Corrie Mckusick D.O.   On: 01/07/2020 12:41   DG Chest Port 1 View  Result Date: 01/07/2020 CLINICAL DATA:  Acute  ischemic stroke EXAM: PORTABLE CHEST 1 VIEW COMPARISON:  None. FINDINGS: Cardiomegaly. The hila and mediastinum are normal. No pneumothorax. No nodules or masses. The right lung is clear. Minimal opacity in left lung may represent atelectasis. No other acute abnormalities. IMPRESSION: Cardiomegaly. Mild atelectasis suspected on the left. No other abnormalities. Electronically Signed   By: Dorise Bullion III M.D   On: 01/07/2020 15:27   IR PERCUTANEOUS ART THROMBECTOMY/INFUSION INTRACRANIAL INC DIAG ANGIO  Result Date: 01/07/2020 INDICATION: 74 year old female with a history of acute stroke, right MCA occlusion suspected. Patient has a history of renal transplant, with no CT angiogram or perfusion performed. She presents for attempt at mechanical thrombectomy based on clinical CT mismatch. EXAM: ULTRASOUND GUIDED ACCESS RIGHT COMMON FEMORAL ARTERY CERVICAL AND CEREBRAL ANGIOGRAM MECHANICAL THROMBECTOMY RIGHT MCA ANGIO-SEAL FOR HEMOSTASIS COMPARISON:  Head CT 01/07/2020 MEDICATIONS: None ANESTHESIA/SEDATION: The anesthesia team was present to provide general endotracheal tube anesthesia and for patient monitoring during the procedure. Interventional neuro radiology nursing staff was also present. CONTRAST:  65 cc contrast FLUOROSCOPY TIME:  Fluoroscopy Time: 18 minutes 24 seconds (896 mGy). COMPLICATIONS: None TECHNIQUE: Informed written consent was obtained from the patient's family after a thorough discussion of the procedural risks, benefits and alternatives. Specific risks discussed include: Bleeding, infection, contrast reaction, kidney injury/failure, need for further procedure/surgery, arterial injury or dissection, embolization to new territory, intracranial hemorrhage (10-15% risk), neurologic deterioration,  cardiopulmonary collapse, death. All questions were addressed. Maximal Sterile Barrier Technique was utilized including during the procedure including caps, mask, sterile gowns, sterile gloves, sterile drape, hand hygiene and skin antiseptic. A timeout was performed prior to the initiation of the procedure. The anesthesia team was present to provide general endotracheal tube anesthesia and for patient monitoring during the procedure. Interventional neuro radiology nursing staff was also present. FINDINGS: Initial Findings: Right internal carotid artery: Cervical ICA patent to the skull base. The horizontal petrous portion was occluded at the skull base on the initial angiogram. Right MCA:  Occlusion Right ACA:  Occlusion Completion Findings: Right MCA: After treatment, there is restoration of flow through the cervical ICA segment, restoration of flow through the ICA terminus, and complete revascularization of the right MCA territory. TICI 3 Flat panel CT: No contrast staining, and no intraparenchymal hemorrhage or subarachnoid hemorrhage. PROCEDURE: The anesthesia team was present to provide general endotracheal tube anesthesia and for patient monitoring during the procedure. Intubation was performed in negative pressure [place] in neuro IR holding. Interventional neuro radiology nursing staff was also present. Ultrasound survey of the right inguinal region was performed with images stored and sent to PACs. 11 blade scalpel was used to make a small incision. Blunt dissection was performed with US guidance. A micropuncture needle was used access the right common femoral artery under ultrasound. With excellent arterial blood flow returned, an .018 micro wire was passed through the needle, observed to enter the abdominal aorta under fluoroscopy. The needle was removed, and a micropuncture sheath was placed over the wire. The inner dilator and wire were removed, and an 035 wire was advanced under fluoroscopy into the  abdominal aorta. The sheath was removed and a 25cm 7F straight vascular sheath was placed. The dilator was removed and the sheath was flushed. Sheath was attached to pressurized and heparinized saline bag for constant forward flow. A coaxial system was then advanced over the 035 wire. This included a 95cm 087 "Walrus" balloon guide with coaxial 125cm Berenstein diagnostic catheter. This was advanced to the proximal descending thoracic aorta. Wire  was then removed. Double flush of the catheter was performed. Catheter was then used to select the innominate artery. Angiogram was performed. Using roadmap technique, the catheter was advanced over a standard glide wire into right cervical ICA, with distal position achieved of the balloon guide. The diagnostic catheter and the wire were removed. Formal angiogram was performed. Road map function was used once the occluded vessel was identified, with terminus occlusion identified. Copious back flush was performed and the balloon catheter was attached to heparinized and pressurized saline bag for forward flow. A second coaxial system was then advanced through the balloon catheter, which included the selected intermediate catheter, microcatheter, and microwire. In this scenario, the set up included a 71 zoom catheter, a Trevo Provue18 microcatheter, and 014 synchro soft wire. This system was advanced through the balloon guide catheter under the road-map function, with adequate back-flush at the rotating hemostatic valve at that back end of the balloon guide. Microcatheter and the intermediate catheter system were advanced through the terminal ICA into the proximal M1 segment. The aspiration catheter was then advanced into the cavernous segment. The proprietary engine was then attached to the back end of the supra athero. Aspiration was then performed confirming blood return in the cavernous segment. The catheter was slowly advanced through the siphon until blood return halted.  At this point the microcatheter and the microwire were removed from the system. Aspiration was performed for 2 minutes and the catheter was withdrawn. Once return of flow occurred at the tip of the catheter, the catheter was removed. Catheter was removed while aspiration was performed at the tip of the balloon guide. Aspiration catheter was removed from the system, the Touhy was removed, and aspiration was performed at the hub of the balloon guide confirming flow. Repeat angiogram was performed. This confirmed persisting partial occlusion in the M1 segment, with less than 50% reperfusion of the MCA territory. At this point there was restoration of flow into a fetal PCA. Fetal PCA had significant perfusion of the temporal arteries, with leptomeningeal collateralization to the MCA territory. We then elected to perform a second pass. Coaxial system was then advanced through the balloon catheter, which included the selected intermediate catheter, microcatheter, and microwire. In this scenario, the set up included a 55 zoom catheter, a Trevo Provue18 microcatheter, and 014 synchro soft wire. This system was advanced through the balloon guide catheter under the road-map function, with adequate back-flush at the rotating hemostatic valve at that back end of the balloon guide. Microcatheter and the intermediate catheter system were advanced through the terminal ICA into the distal M1 segment. Microwire was then carefully advanced through the occluded segment. Microcatheter was then manipulated through the occluded segment and the wire was removed with saline drip at the hub. Blood was then aspirated through the hub of the microcatheter, and a gentle contrast injection was performed confirming intraluminal position. A rotating hemostatic valve was then attached to the back end of the microcatheter, and a pressurized and heparinized saline bag was attached to the catheter. 4 x 40 solitaire device was then selected. Back flush  was achieved at the rotating hemostatic valve, and then the device was gently advanced through the microcatheter to the distal end. The retriever was then unsheathed by withdrawing the microcatheter under fluoroscopy. Once the retriever was completely unsheathed, the microcatheter was carefully stripped from the delivery device. Control angiogram was performed from the intermediate catheter. A 3 minute time interval was observed. The balloon at the balloon guide catheter  was then inflated under fluoroscopy for proximal flow arrest. Constant aspiration using the proprietary engine was then performed at the intermediate catheter, as the retriever was gently and slowly withdrawn with fluoroscopic observation. Once the retriever was "corked" within the tip of the intermediate catheter, both were removed from the system. Free aspiration was attempted at the hub of the balloon guide catheter, with no free blood return confirmed. The balloon was then deflated, with constant aspiration on the hub of the balloon guide. With no blood return, the balloon guide was slowly withdrawn from the cervical segment, through the aorta, and removed from the 8 French sheath. At this point flush was performed to remove a large embolus from the tip of the walrus balloon guide. JB 1 catheter was then advanced on a Glidewire to the aortic arch. Wire was removed and double flush was performed. JB diagnostic catheter was then used to select the cervical ICA. Angiogram was performed. Restoration of flow was confirmed, with TICI 3 flow. Angiogram of the cervical ICA was performed. Balloon guide was then removed. The skin at the puncture site was then cleaned with Chlorhexidine. The 8 French sheath was removed and an 31F angioseal was deployed. Flat panel CT was performed. Patient was extubated once the CT was reviewed. Patient tolerated the procedure well and remained hemodynamically stable throughout. No complications were encountered and no  significant blood loss encountered. IMPRESSION: Status post ultrasound guided access right common femoral artery for cervical and cerebral angiogram, mechanical thrombectomy of a right ICA terminus occlusion, and complete restoration of flow through the MCA territory (TICI 3). PLAN: ICU status Target systolic blood pressure of 120-140 Right hip straight time 6 hours Frequent neurovascular checks Repeat neurologic imaging with CT and/MRI at the discretion of neurology team Electronically Signed   By: Corrie Mckusick D.O.   On: 01/07/2020 12:41   CT HEAD CODE STROKE WO CONTRAST  Result Date: 01/07/2020 CLINICAL DATA:  Code stroke.  Right MCA syndrome EXAM: CT HEAD WITHOUT CONTRAST TECHNIQUE: Contiguous axial images were obtained from the base of the skull through the vertex without intravenous contrast. COMPARISON:  01/28/2013 FINDINGS: Brain: No evidence of acute infarction, hemorrhage, hydrocephalus, extra-axial collection or mass lesion/mass effect. Vascular: Suspect hyperdense right supraclinoid ICA. Skull: Negative for fracture or bone lesion Sinuses/Orbits: Right cataract resection.  Gaze to the right. Other: These results were called by telephone at the time of interpretation on 01/07/2020 at 10:14 am to provider Rory Percy, who verbally acknowledged these results. ASPECTS [facility] Stroke Program Early CT Score) - Ganglionic level infarction (caudate, lentiform nuclei, internal capsule, insula, M1-M3 cortex): 7 - Supraganglionic infarction (M4-M6 cortex): 3 Total score (0-10 with 10 being normal): 10 IMPRESSION: 1. Concern for hyperdense right supraclinoid ICA. 2. No intracranial hemorrhage.  ASPECTS is 10. Electronically Signed   By: Monte Fantasia M.D.   On: 01/07/2020 10:15    Labs:  CBC: Recent Labs    11/16/19 1456 01/07/20 1013 01/07/20 1021  WBC 6.3 6.5  --   HGB 13.3 13.8 15.0  HCT 39.7 42.0 44.0  PLT 254 222  --     COAGS: Recent Labs    01/07/20 1013  INR 1.3*  APTT 30     BMP: Recent Labs    11/16/19 1456 01/07/20 1013 01/07/20 1021  NA 146* 142 141  K 3.4* 3.3* 3.4*  CL 105 106 105  CO2 24 23  --   GLUCOSE 98 169* 166*  BUN 10 10 12   CALCIUM 10.0  9.5  --   CREATININE 0.94 0.97 0.80  GFRNONAA 61 59*  --   GFRAA 71 >60  --     LIVER FUNCTION TESTS: Recent Labs    11/16/19 1456 01/07/20 1013  BILITOT 0.8 0.8  AST 27 55*  ALT 32 41  ALKPHOS 131* 103  PROT 7.8 7.7  ALBUMIN 4.5 3.6    Assessment and Plan:  History of acute CVA s/p cerebral arteriogram with emergent mechanical thrombectomy of right ICA terminus occlusion achieving a TICI 3 revascularization via right femoral approach 01/07/2020 by Dr. Earleen Newport. Patient's condition stable- no focal neurological deficits noted, can spontaneously move all extremities. Right groin incision stable, distal pulses (DPs) 1+ bilaterally. Further plans per neurology- appreciate and agree with management. Please call NIR with questions/concerns.   Electronically Signed: Earley Abide, PA-C 01/08/2020, 10:21 AM   I spent a total of 25 Minutes at the the patient's bedside AND on the patient's hospital floor or unit, greater than 50% of which was counseling/coordinating care for CVA s/p revascularization.

## 2020-01-08 NOTE — Evaluation (Signed)
Speech Language Pathology Evaluation Patient Details Name: Sarah Gilbert MRN: [id number] DOB: [birth year] Today's Date: 01/08/2020 Time: 0355-9741 SLP Time Calculation (min) (ACUTE ONLY): 25 min  Problem List:  Patient Active Problem List   Diagnosis Date Noted  . Acute ischemic stroke ([place]) 01/07/2020  . Abnormal glucose 12/29/2018  . Mixed hyperlipidemia 09/29/2018  . Hematuria 09/29/2018  . Lipoma of back 09/29/2018  . Rotator cuff tear arthropathy of right shoulder 06/18/2015  . S/p reverse total shoulder arthroplasty 06/18/2015  . A-fib ([place]) 05/01/2015  . Chronic anticoagulation 05/01/2015  . Right foot pain 03/25/2015  . Shingles (herpes zoster) polyneuropathy 02/06/2014  . Constipation 01/27/2012  . Hypercholesteremia 01/27/2012  . Hypertension, essential 01/27/2012  . Immunosuppression ([place]) 01/27/2012  . Obesity 01/27/2012   Past Medical History:  Past Medical History:  Diagnosis Date  . Anemia   . Arthritis   . Atrial fibrillation ([place])   . Bleeding nose    history of   . ESRD (end stage renal disease) ([place])   . History of bronchitis   . Hyperlipemia   . Hypertension   . Joint inflammation   . Obesity   . PONV (postoperative nausea and vomiting)   . Rotator cuff tear arthropathy of right shoulder 06/18/2015  . Thyroid disease   . Wears glasses    Past Surgical History:  Past Surgical History:  Procedure Laterality Date  . ABDOMINAL HYSTERECTOMY    . APPENDECTOMY    . CARDIAC CATHETERIZATION N/A 04/18/2015   Procedure: Left Heart Cath and Coronary Angiography;  Surgeon: Charolette Forward, MD;  Location: [facility];  Service: Cardiovascular;  Laterality: N/A;  . CHOLECYSTECTOMY    . COLONOSCOPY    . IR CT HEAD LTD  01/07/2020  . IR PERCUTANEOUS ART THROMBECTOMY/INFUSION INTRACRANIAL INC DIAG ANGIO  01/07/2020  . IR US GUIDE VASC ACCESS RIGHT  01/07/2020  . KIDNEY TRANSPLANT     june 2011-[facility]  . TONSILLECTOMY    . TOTAL SHOULDER ARTHROPLASTY  Right 06/18/2015   Procedure: RIGHT TOTAL SHOULDER ARTHROPLASTY;  Surgeon: Marchia Bond, MD;  Location: [facility];  Service: Orthopedics;  Laterality: Right;   HPI:  Pt is a 74 yo female admitted with R ICA/MCA stroke s/p thrombectomy with successful revascularization. PMH includes: thyroid disease, HTN, HLD, ESRD s/p double transplant, Afib   Assessment / Plan / Recommendation Clinical Impression  Pt scored a 25/30 on the SLUMS, suggestive of possible mild cognitive impairment. She reports having baseline memory deficits, although she was able to complete delayed recall task with 100% accuracy. The majority of her points (four) were lost in testing during the clock drawing task, during which pt exhibited trouble with more executive functions. She had reduced organization/planning, self-monitoring and correcting, and more complex problem solving. In sharing this with pt, she believes that she would have had trouble with this PTA as well, and that she is at her baseline level of function. We reviewed strategies to help her with her memory and activities to keep her mind engaged. She does also have support from her uncle  - recommend intermittent supervision upon initial return home.     SLP Assessment  SLP Recommendation/Assessment: Patient does not need any further Speech Lanaguage Pathology Services SLP Visit Diagnosis: Cognitive communication deficit (R41.841)    Follow Up Recommendations  Other (comment)(intermittent supervision upon initial return home)    Frequency and Duration           SLP Evaluation Cognition  Overall Cognitive Status: History  of cognitive impairments - at baseline Orientation Level: Oriented X4       Comprehension  Auditory Comprehension Overall Auditory Comprehension: Appears within functional limits for tasks assessed    Expression Expression Primary Mode of Expression: Verbal Verbal Expression Overall Verbal Expression: Appears within functional limits for  tasks assessed Written Expression Dominant Hand: Right   Oral / Motor  Motor Speech Overall Motor Speech: Appears within functional limits for tasks assessed   GO                     Osie Bond., M.A. [facility] Pager [phone] Office [phone]  01/08/2020, 10:32 AM

## 2020-01-08 NOTE — Evaluation (Signed)
Physical Therapy Evaluation Patient Details Name: Sarah Gilbert MRN: [id number] DOB: 01/02/1946 Today's Date: 01/08/2020   History of Present Illness  Sarah Gilbert is an 74 y.o. female double kidney transplant (2011), HTN, . A fib (on xarelto), HLD who presented as a code stroke. For left side weakness. 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 Pt underwent thrombectomy in IR.    Clinical Impression  Pt admitted with above. Pt functioning near baseline. Mild L sided weakness, good co-ordination and sequencing. Pt more steady with RW. Recommend RW to aide with balance as HHPT progresses pt back to indep. Pt denies blurred or double vision or altered sensation. Acute PT to cont to follow to progress mobility.    Follow Up Recommendations Home health PT;Supervision/Assistance - 24 hour    Equipment Recommendations  Rolling walker with 5" wheels    Recommendations for Other Services       Precautions / Restrictions Precautions Precautions: Fall Restrictions Weight Bearing Restrictions: No      Mobility  Bed Mobility Overal bed mobility: Needs Assistance Bed Mobility: Supine to Sit     Supine to sit: Supervision     General bed mobility comments: for lines and safety, supine>sit from flat bed   Transfers Overall transfer level: Needs assistance Equipment used: 1 person hand held assist;2 person hand held assist Transfers: Sit to/from Stand Sit to Stand: Min guard         General transfer comment: min guard for safety for first time up, pt with wide base of support, mildly unsteady upon standing but able to gain balance  Ambulation/Gait Ambulation/Gait assistance: Min assist;+2 safety/equipment Gait Distance (Feet): 120 Feet Assistive device: Rolling walker (2 wheeled) Gait Pattern/deviations: Step-through pattern;Decreased stride length;Wide base of support Gait velocity: decreased   General Gait Details: pt with increased UE support on RW. minA for  walker management and navigating around obstacles  Stairs            Wheelchair Mobility    Modified Rankin (Stroke Patients Only) Modified Rankin (Stroke Patients Only) Pre-Morbid Rankin Score: No significant disability Modified Rankin: Slight disability     Balance Overall balance assessment: Needs assistance Sitting-balance support: Feet supported;No upper extremity supported Sitting balance-Leahy Scale: Fair     Standing balance support: Single extremity supported;During functional activity Standing balance-Leahy Scale: Fair Standing balance comment: pt observed working with OT at sink and able to perform pericare                             Pertinent Vitals/Pain Pain Assessment: No/denies pain    Home Living Family/patient expects to be discharged to:: Private residence Living Arrangements: Other relatives(lives with uncle ) Available Help at Discharge: Family Type of Home: House Home Access: Ramped entrance     [place]: One [place]: [place] - single point;Bedside commode      Prior Function Level of Independence: Independent with assistive device(s)         Comments: uses SPC outside of the home, ambulatory without AD inside the home, driving PTA      Hand Dominance   Dominant Hand: Right    Extremity/Trunk Assessment   Upper Extremity Assessment Upper Extremity Assessment: Defer to OT evaluation    Lower Extremity Assessment Lower Extremity Assessment: LLE deficits/detail LLE Deficits / Details: L LE mild weakness compared to R, able to amb without buckling    Cervical / Trunk Assessment  Cervical / Trunk Assessment: Normal  Communication   Communication: No difficulties  Cognition Arousal/Alertness: Awake/alert Behavior During Therapy: WFL for tasks assessed/performed Overall Cognitive Status: History of cognitive impairments - at baseline                                 General Comments: pt  oriented and with good safety awareness      General Comments General comments (skin integrity, edema, etc.): some bruising on UEs, 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    Exercises     Assessment/Plan    PT Assessment Patient needs continued PT services  PT Problem List Decreased strength;Decreased range of motion;Decreased activity tolerance;Decreased balance;Decreased mobility;Decreased coordination;Decreased cognition;Decreased knowledge of use of DME;Decreased safety awareness       PT Treatment Interventions DME instruction;Gait training;Stair training;Functional mobility training;Therapeutic activities;Therapeutic exercise;Balance training    PT Goals (Current goals can be found in the Care Plan section)  Acute Rehab PT Goals Patient Stated Goal: home ASAP PT Goal Formulation: With patient Time For Goal Achievement: 01/22/20 Potential to Achieve Goals: Good    Frequency Min 4X/week   Barriers to discharge        Co-evaluation PT/OT/SLP Co-Evaluation/Treatment: Yes Reason for Co-Treatment: To address functional/ADL transfers PT goals addressed during session: Mobility/safety with mobility         AM-PAC PT "6 Clicks" Mobility  Outcome Measure Help needed turning from your back to your side while in a flat bed without using bedrails?: A Little Help needed moving from lying on your back to sitting on the side of a flat bed without using bedrails?: A Little Help needed moving to and from a bed to a chair (including a wheelchair)?: A Little Help needed standing up from a chair using your arms (e.g., wheelchair or bedside chair)?: A Little Help needed to walk in hospital room?: A Little Help needed climbing 3-5 steps with a railing? : A Little 6 Click Score: 18    End of Session Equipment Utilized During Treatment: Gait belt Activity Tolerance: Patient tolerated treatment well Patient left: in chair;with call bell/phone within reach;with chair alarm set;with  nursing/sitter in room(MD present) Nurse Communication: Mobility status PT Visit Diagnosis: Unsteadiness on feet (R26.81);Muscle weakness (generalized) (M62.81);History of falling (Z91.81);Ataxic gait (R26.0);Difficulty in walking, not elsewhere classified (R26.2)    Time: 4854-6270 PT Time Calculation (min) (ACUTE ONLY): 27 min   Charges:   PT Evaluation $PT Eval Moderate Complexity: 1 Mod          Kittie Plater, PT, DPT Acute Rehabilitation Services Pager #: [phone] Office #: [phone]   Berline Lopes 01/08/2020, 10:34 AM

## 2020-01-08 NOTE — Care Management (Signed)
Benefits inquiry placed for Eliquis 5mg  twice daily vs. Warfarin 5mg  daily/ 30 tablets.    Results to follow.   Reinaldo Raddle, RN, BSN  Trauma/Neuro ICU Case Manager [phone]

## 2020-01-08 NOTE — Progress Notes (Signed)
STROKE TEAM PROGRESS NOTE   INTERVAL HISTORY Her therapists are at the bedside.  She is up with them walking in the hall. She is anxious to not have another stroke. Reports she was getting AC from Dr. Zenia Resides office, but had ran out and his office was out as well. Last dose 3-5 days ago.  Blood pressure well controlled.  Neurologically continues to improve.  No complaints.  Vitals:   01/08/20 1100 01/08/20 1144 01/08/20 1200 01/08/20 1300  BP: 131/70  138/70   Pulse:   83 83  Resp: (!) 22 (!) 24 (!) 22 18  Temp:  97.7 F (36.5 C)    TempSrc:  Oral    SpO2:   99% 98%  Weight:      Height:        CBC:  Recent Labs  Lab 01/07/20 1013 01/07/20 1021  WBC 6.5  --   NEUTROABS 4.1  --   HGB 13.8 15.0  HCT 42.0 44.0  MCV 93.3  --   PLT 222  --     Basic Metabolic Panel:  Recent Labs  Lab 01/07/20 1013 01/07/20 1021  NA 142 141  K 3.3* 3.4*  CL 106 105  CO2 23  --   GLUCOSE 169* 166*  BUN 10 12  CREATININE 0.97 0.80  CALCIUM 9.5  --    Lipid Panel:     Component Value Date/Time   CHOL 127 01/08/2020 0510   CHOL 169 11/16/2019 1456   TRIG 44 01/08/2020 0510   HDL 44 01/08/2020 0510   HDL 78 11/16/2019 1456   CHOLHDL 2.9 01/08/2020 0510   VLDL 9 01/08/2020 0510   LDLCALC 74 01/08/2020 0510   LDLCALC 81 11/16/2019 1456   HgbA1c:  Lab Results  Component Value Date   HGBA1C 6.1 (H) 01/08/2020   Urine Drug Screen: No results found for: LABOPIA, COCAINSCRNUR, LABBENZ, AMPHETMU, THCU, LABBARB  Alcohol Level No results found for: ETH  IMAGING past 24 hours MR ANGIO HEAD WO CONTRAST  Result Date: 01/07/2020 CLINICAL DATA:  74 year old female code stroke presentation earlier today, right ICA terminus occlusion treated with endovascular reperfusion. History of renal transplant. EXAM: MRA HEAD WITHOUT CONTRAST TECHNIQUE: Angiographic images of the Circle of Willis were obtained using MRA technique without intravenous contrast. COMPARISON:  Brain MRI and neck MRA today  reported separately. FINDINGS: Antegrade flow in the posterior circulation with mildly dominant distal left vertebral artery. No distal vertebral stenosis. Tortuous but patent vertebrobasilar junction. Patent bilateral PICA origins. The basilar artery is tortuous but patent without stenosis. Patent SCA and left PCA origins. There is a fetal type right PCA origin. Bilateral P1/posterior communicating arteries are tortuous. Bilateral PCA branches are within normal limits. Antegrade flow in both ICA siphons. Tortuous siphons with mild irregularity but no significant stenosis. Normal ophthalmic and right posterior communicating artery origins. Patent and symmetric carotid termini. Normal MCA and ACA origins. Mildly tortuous A1 segments. Anterior communicating artery and visible ACA branches are within normal limits. MCA M1 segments and bifurcations are patent without stenosis. Visible bilateral MCA branches are within normal limits. IMPRESSION: Negative intracranial MRA status post endovascular treatment of right ICA terminus occlusion. Mild intracranial atherosclerosis without stenosis. Electronically Signed   By: Genevie Ann M.D.   On: 01/07/2020 19:01   MR ANGIO NECK WO CONTRAST  Result Date: 01/07/2020 CLINICAL DATA:  74 year old female code stroke presentation earlier today, right ICA terminus occlusion treated with endovascular reperfusion. History of renal transplant. EXAM: MRA NECK  WITHOUT CONTRAST TECHNIQUE: Angiographic images of the neck were obtained using MRA technique without intravenous contrast. Carotid stenosis measurements (when applicable) are obtained utilizing NASCET criteria, using the distal internal carotid diameter as the denominator. COMPARISON:  Brain MRI today reported separately. FINDINGS: Time-of-flight images demonstrate a 3 vessel arch configuration. There is antegrade flow in both common carotid arteries. Both carotid bifurcations are patent. Antegrade flow in both cervical ICAs to the  skull base. Only mild irregularity at the ICA origins and greater on the left. No hemodynamically significant carotid stenosis identified. Antegrade flow in both cervical vertebral arteries, the left appears dominant. Vertebral artery origins not visible but no V2 or V3 vertebral artery stenosis identified. IMPRESSION: Patent cervical carotid and vertebral arteries with no evidence of hemodynamically significant stenosis on noncontrast MRA. Electronically Signed   By: Genevie Ann M.D.   On: 01/07/2020 18:57   MR BRAIN WO CONTRAST  Result Date: 01/07/2020 CLINICAL DATA:  75 year old female code stroke presentation earlier today, right ICA terminus occlusion treated with endovascular reperfusion. History of renal transplant. EXAM: MRI HEAD WITHOUT CONTRAST TECHNIQUE: Multiplanar, multiecho pulse sequences of the brain and surrounding structures were obtained without intravenous contrast. COMPARISON:  Presentation noncontrast head CT 1004 hours. FINDINGS: The examination had to be discontinued prior to completion by patient request. Axial and coronal DWI, axial SWI, and axial FLAIR imaging only was obtained. DWI reveals only a few small (10 mm or smaller) areas of restricted diffusion in the right hemisphere including portions of the right amygdala (series 7, image 57), posterosuperior right temporal gyrus (series 5, image 78), and punctate cortex of the right frontal operculum. No other restricted diffusion. No midline shift, mass effect, or evidence of intracranial mass lesion. No ventriculomegaly. No evidence of intracranial hemorrhage. Subtle FLAIR hyperintensity at the areas of abnormal DWI. Mild for age superimposed scattered white matter FLAIR hyperintensity elsewhere. No chronic cortical encephalomalacia. IMPRESSION: 1. Truncated exam by patient request.  DWI, SWI, and FLAIR obtained. 2. Only several small scattered areas of restricted diffusion are identified in the right hemisphere with mild FLAIR  hyperintensity. No evidence of hemorrhage or mass effect. Electronically Signed   By: Genevie Ann M.D.   On: 01/07/2020 18:54   DG Chest Port 1 View  Result Date: 01/07/2020 CLINICAL DATA:  Acute ischemic stroke EXAM: PORTABLE CHEST 1 VIEW COMPARISON:  None. FINDINGS: Cardiomegaly. The hila and mediastinum are normal. No pneumothorax. No nodules or masses. The right lung is clear. Minimal opacity in left lung may represent atelectasis. No other acute abnormalities. IMPRESSION: Cardiomegaly. Mild atelectasis suspected on the left. No other abnormalities. Electronically Signed   By: Dorise Bullion III M.D   On: 01/07/2020 15:27   ECHOCARDIOGRAM COMPLETE  Result Date: 01/08/2020    ECHOCARDIOGRAM REPORT   Patient Name:   Sarah Gilbert [facility] Date of Exam: 01/08/2020 Medical Rec #:  [id number]          Height:       65.0 in Accession #:    [id number]         Weight:       231.5 lb Date of Birth:  Mar 10, 1946         BSA:          2.105 m Patient Age:    64 years           BP:           138/70 mmHg Patient Gender: F  HR:           83 bpm. Exam Location:  Inpatient Procedure: 2D Echo, Cardiac Doppler and Color Doppler Indications:    CVA  History:        Patient has no prior history of Echocardiogram examinations.                 Stroke, Arrythmias:Atrial Fibrillation; Risk                 Factors:Hypertension and Dyslipidemia. Kidney transplant, ESRD.  Sonographer:    Dustin Flock Referring Phys: [id number] ASHISH ARORA IMPRESSIONS  1. Left ventricular ejection fraction, by estimation, is 60 to 65%. The left ventricle has normal function. The left ventricle has no regional wall motion abnormalities. There is moderate asymmetric left ventricular hypertrophy. Left ventricular diastolic function could not be evaluated.  2. Right ventricular systolic function is normal. The right ventricular size is normal. There is mildly elevated pulmonary artery systolic pressure.  3. Left atrial size was moderately  dilated.  4. Right atrial size was moderately dilated.  5. The mitral valve is grossly normal. No evidence of mitral valve regurgitation. No evidence of mitral stenosis.  6. Tricuspid valve regurgitation is mild to moderate.  7. The aortic valve is normal in structure. Aortic valve regurgitation is trivial. No aortic stenosis is present. FINDINGS  Left Ventricle: Left ventricular ejection fraction, by estimation, is 60 to 65%. The left ventricle has normal function. The left ventricle has no regional wall motion abnormalities. The left ventricular internal cavity size was normal in size. There is  moderate asymmetric left ventricular hypertrophy. Left ventricular diastolic function could not be evaluated due to atrial fibrillation. Left ventricular diastolic function could not be evaluated. Right Ventricle: The right ventricular size is normal. No increase in right ventricular wall thickness. Right ventricular systolic function is normal. There is mildly elevated pulmonary artery systolic pressure. The tricuspid regurgitant velocity is 2.88  m/s, and with an assumed right atrial pressure of 3 mmHg, the estimated right ventricular systolic pressure is 62.2 mmHg. Left Atrium: Left atrial size was moderately dilated. Right Atrium: Right atrial size was moderately dilated. Pericardium: There is no evidence of pericardial effusion. Mitral Valve: The mitral valve is grossly normal. No evidence of mitral valve regurgitation. No evidence of mitral valve stenosis. Tricuspid Valve: The tricuspid valve is grossly normal. Tricuspid valve regurgitation is mild to moderate. No evidence of tricuspid stenosis. Aortic Valve: The aortic valve is normal in structure. Aortic valve regurgitation is trivial. Aortic regurgitation PHT measures 571 msec. No aortic stenosis is present. Aortic valve peak gradient measures 15.7 mmHg. Pulmonic Valve: The pulmonic valve was normal in structure. Pulmonic valve regurgitation is not visualized. No  evidence of pulmonic stenosis. Aorta: The aortic root and ascending aorta are structurally normal, with no evidence of dilitation. IAS/Shunts: The atrial septum is grossly normal.  LEFT VENTRICLE PLAX 2D LVIDd:         3.75 cm  Diastology LVIDs:         2.50 cm  LV e' lateral:   17.60 cm/s LV PW:         1.19 cm  LV E/e' lateral: 5.1 LV IVS:        1.47 cm  LV e' medial:    7.62 cm/s LVOT diam:     2.00 cm  LV E/e' medial:  11.7 LV SV:         68 LV SV Index:   33 LVOT Area:  3.14 cm  RIGHT VENTRICLE RV Basal diam:  2.83 cm RV S prime:     10.60 cm/s TAPSE (M-mode): 3.2 cm LEFT ATRIUM             Index       RIGHT ATRIUM           Index LA diam:        3.40 cm 1.62 cm/m  RA Area:     24.90 cm LA Vol (A2C):   88.9 ml 42.23 ml/m RA Volume:   78.40 ml  37.25 ml/m LA Vol (A4C):   94.3 ml 44.80 ml/m LA Biplane Vol: 92.4 ml 43.90 ml/m  AORTIC VALVE AV Area (Vmax): 1.60 cm AV Vmax:        198.00 cm/s AV Peak Grad:   15.7 mmHg LVOT Vmax:      101.00 cm/s LVOT Vmean:     56.700 cm/s LVOT VTI:       0.218 m AI PHT:         571 msec  AORTA Ao Root diam: 2.90 cm MITRAL VALVE               TRICUSPID VALVE MV Area (PHT): 6.96 cm    TR Peak grad:   33.2 mmHg MV Decel Time: 109 msec    TR Vmax:        288.00 cm/s MV E velocity: 89.20 cm/s MV A velocity: 32.30 cm/s  SHUNTS MV E/A ratio:  2.76        Systemic VTI:  0.22 m                            Systemic Diam: 2.00 cm Mertie Moores MD Electronically signed by Mertie Moores MD Signature Date/Time: 01/08/2020/2:01:18 PM    Final     PHYSICAL EXAM Pleasant elderly African-American lady who is not in distress. . Afebrile. Head is nontraumatic. Neck is supple without bruit.    Cardiac exam no murmur or gallop. Lungs are clear to auscultation. Distal pulses are well felt. Neurological Exam ;  Awake  Alert oriented x 3. Normal speech and language.eye movements full without nystagmus.fundi were not visualized. Vision acuity and fields appear normal. Hearing is normal.  Palatal movements are normal. Face symmetric. Tongue midline. Normal strength, tone, reflexes and coordination.  Diminished fine finger movements on the left.  Orbits right over left upper extremity.  Mild weakness of left grip.  Slight diminished foot tapping on the left.  Normal sensation. Gait deferred. NIHSS 0 ASSESSMENT/PLAN [name] is a 74 y.o. female with history of double kidney transplant (2011), HTN, . A fib (on xarelto), HLD presenting with L isded weakness, R gaze, L facial and slurred speech.   Stroke:   Severeal small R MCA infarcts embolic secondary to known AF off AC  Code Stroke CT head hyperdense R supraclinoid ICA. ASPECTS 10.     Cerebral angio R ICA terminus thrombus s/p TICI3 revascularization w/ 2 passes Earleen Newport)  Post IR CT no hemorrhage, no statin   MRI  Several small scattered R hemisphere infarcts  MRA head mild intracranial atherosclerosis   MRA neck Unremarkable   2D Echo EF 60-65%. No source of embolus. LA dilated  LDL 74  HgbA1c 6.1  SCDs for VTE prophylaxis changed to Lovenox 40 mg sq daily   Xarelto (rivaroxaban) daily prior to admission but had missed 3-4 days, now on No antithrombotic. Will check DOAC  insurance coverage and add AC based on affordability  Therapy recommendations:  HH PT  Disposition:  pending   Transfer to the floor  Anticipate d/c am  Atrial Fibrillation  Home anticoagulation:  Xarelto (rivaroxaban) daily- she had missed 3-4 days of meds PTA as Dr. Terrence Dupont supplies it to her and did not have  Will check DOAC insurance coverage and add AC based on affordability  Hypertension  Home meds:  amlodpine 5, lasix 40, metoprolol 50 bid     SBP goal now < 180  Stable 140s  Resume home meds x lasix for now . Long-term BP goal normotensive  Hyperlipidemia  Home meds:  lipitor 20  Statin increased to lipitor 40  LDL 74, goal < 70  Continue statin at discharge  Other Stroke Risk Factors  Advanced  age  Obesity, Body mass index is 38.52 kg/m., recommend weight loss, diet and exercise as appropriate   Other Active Problems  ESRD s/p B kidney transplant on prograf and cellcept. Not on HD  Thyroid dz    Hypokalemia 3.3->3.4 - supplemented  Hospital day # 1 Patient presented with embolic right MCA infarct due to not being on anticoagulation for her A. fib and underwent successful mechanical thrombectomy with excellent clinical outcome.  She will need to resume anticoagulation but hopefully be able to afford it.  Will check with her health insurance what her co-pays for NOAC will be if she cannot afford it may need to consider warfarin.  Long discussion with patient and answered questions.  Discussed with care team.  Continue close neurological monitoring and strict blood pressure control as per post intervention protocol.This patient is critically ill and at significant risk of neurological worsening, death and care requires constant monitoring of vital signs, hemodynamics,respiratory and cardiac monitoring, extensive review of multiple databases, frequent neurological assessment, discussion with family, other specialists and medical decision making of high complexity.I have made any additions or clarifications directly to the above note.This critical care time does not reflect procedure time, or teaching time or supervisory time of PA/NP/Med Resident etc but could involve care discussion time.  I spent 30 minutes of neurocritical care time  in the care of  this patient.    Antony Contras, MD  To contact Stroke Continuity provider, please refer to [email or web address] After hours, contact General Neurology

## 2020-01-08 NOTE — Discharge Summary (Addendum)
Stroke Discharge Summary  Patient ID: Sarah Gilbert   MRN: [id number]      DOB: 11/18/1945  Date of Admission: 01/07/2020 Date of Discharge: 01/09/2020  Attending Physician:  Garvin Fila, MD, Stroke MD Consultant(s):     Corrie Mckusick, DO (Interventional Neuroradiologist) Patient's PCP:  [name], FNP  DISCHARGE DIAGNOSIS:  Principal Problem:   Acute ischemic stroke ([place]) - R MCA embolic from AF off AC s/p thrombectomy Active Problems:   A-fib ([place])   Chronic anticoagulation   Hypertension, essential   Immunosuppression ([place])   Mixed hyperlipidemia   Allergies as of 01/09/2020       Reactions   Vancomycin Hives, Itching, Rash, Other (See Comments)   Blistering, swelling, and peeling of feet.  Pt denies any similar reactions around face or neck.   Iohexol     Desc: iodine contrast/per dialysis center, Onset Date: [id number]        Medication List     STOP taking these medications    nystatin-triamcinolone ointment Commonly known as: MYCOLOG   rivaroxaban 20 MG Tabs tablet Commonly known as: XARELTO       TAKE these medications    amLODipine 10 MG tablet Commonly known as: NORVASC Take 5 mg by mouth daily.   apixaban 5 MG Tabs tablet Commonly known as: ELIQUIS Take 1 tablet (5 mg total) by mouth 2 (two) times daily.   atorvastatin 40 MG tablet Commonly known as: LIPITOR Take 1 tablet (40 mg total) by mouth daily. Start taking on: Jan 10, 2020 What changed:  medication strength how much to take   b complex-vitamin c-folic acid 0.8 MG Tabs tablet Take 0.8 mg by mouth at bedtime.   furosemide 40 MG tablet Commonly known as: LASIX Take 40 mg by mouth daily.   magnesium gluconate 500 MG tablet Commonly known as: MAGONATE Take 500 mg by mouth 2 (two) times daily.   metoprolol tartrate 50 MG tablet Commonly known as: LOPRESSOR Take 50 mg by mouth 2 (two) times daily.   mycophenolate 250 MG capsule Commonly known as: CELLCEPT Take  500 mg by mouth 2 (two) times daily.   pantoprazole 40 MG tablet Commonly known as: PROTONIX Take 40 mg by mouth daily.   potassium chloride SA 20 MEQ tablet Commonly known as: KLOR-CON Take 20 mEq by mouth daily.   tacrolimus 0.5 MG capsule Commonly known as: PROGRAF Take 0.5 mg by mouth 2 (two) times daily. Take one capsule along with the 1mg  capsule   tacrolimus 1 MG capsule Commonly known as: PROGRAF Take 2 mg by mouth 2 (two) times daily.               Durable Medical Equipment  (From admission, onward)           Start     Ordered   01/09/20 1209  For home use only DME Walker rolling  Once    Question Answer Comment  Walker: With [place]   Patient needs a walker to treat with the following condition Stroke ([place])      01/09/20 1208   01/09/20 1209  For home use only DME 3 n 1  Once     01/09/20 1208            LABORATORY STUDIES CBC    Component Value Date/Time   WBC 5.8 01/09/2020 0630   RBC 3.87 01/09/2020 0630   HGB 12.0 01/09/2020 0630   HGB 13.3 11/16/2019 1456  HCT 36.5 01/09/2020 0630   HCT 39.7 11/16/2019 1456   PLT 224 01/09/2020 0630   PLT 254 11/16/2019 1456   MCV 94.3 01/09/2020 0630   MCV 90 11/16/2019 1456   MCH 31.0 01/09/2020 0630   MCHC 32.9 01/09/2020 0630   RDW 12.6 01/09/2020 0630   RDW 12.7 11/16/2019 1456   LYMPHSABS 1.6 01/07/2020 1013   LYMPHSABS 1.4 12/29/2018 1034   MONOABS 0.5 01/07/2020 1013   EOSABS 0.2 01/07/2020 1013   EOSABS 0.2 12/29/2018 1034   BASOSABS 0.0 01/07/2020 1013   BASOSABS 0.0 12/29/2018 1034   CMP    Component Value Date/Time   NA 142 01/09/2020 0630   NA 146 (H) 11/16/2019 1456   K 3.3 (L) 01/09/2020 0630   CL 109 01/09/2020 0630   CO2 24 01/09/2020 0630   GLUCOSE 100 (H) 01/09/2020 0630   BUN <5 (L) 01/09/2020 0630   BUN 10 11/16/2019 1456   CREATININE 0.79 01/09/2020 0630   CALCIUM 9.2 01/09/2020 0630   PROT 7.7 01/07/2020 1013   PROT 7.8 11/16/2019 1456   ALBUMIN 3.6  01/07/2020 1013   ALBUMIN 4.5 11/16/2019 1456   AST 55 (H) 01/07/2020 1013   ALT 41 01/07/2020 1013   ALKPHOS 103 01/07/2020 1013   BILITOT 0.8 01/07/2020 1013   BILITOT 0.8 11/16/2019 1456   GFRNONAA >60 01/09/2020 0630   GFRAA >60 01/09/2020 0630   COAGS Lab Results  Component Value Date   INR 1.3 (H) 01/07/2020   INR 1.51 (H) 06/18/2015   INR 1.45 05/18/2010   Lipid Panel    Component Value Date/Time   CHOL 127 01/08/2020 0510   CHOL 169 11/16/2019 1456   TRIG 44 01/08/2020 0510   HDL 44 01/08/2020 0510   HDL 78 11/16/2019 1456   CHOLHDL 2.9 01/08/2020 0510   VLDL 9 01/08/2020 0510   LDLCALC 74 01/08/2020 0510   LDLCALC 81 11/16/2019 1456   HgbA1C  Lab Results  Component Value Date   HGBA1C 6.1 (H) 01/08/2020   Urinalysis    Component Value Date/Time   COLORURINE YELLOW 05/16/2010 1802   APPEARANCEUR CLEAR 05/16/2010 1802   LABSPEC 1.019 05/16/2010 1802   PHURINE 7.0 05/16/2010 1802   GLUCOSEU NEGATIVE 05/16/2010 1802   HGBUR NEGATIVE 05/16/2010 1802   BILIRUBINUR negative 11/29/2019 1255   KETONESUR 15 (A) 05/16/2010 1802   PROTEINUR Positive (A) 11/29/2019 1255   PROTEINUR 30 (A) 05/16/2010 1802   UROBILINOGEN 0.2 11/29/2019 1255   UROBILINOGEN 0.2 05/16/2010 1802   NITRITE negative 11/29/2019 1255   NITRITE NEGATIVE 05/16/2010 1802   LEUKOCYTESUR Large (3+) (A) 11/29/2019 1255    SIGNIFICANT DIAGNOSTIC STUDIES MR ANGIO HEAD WO CONTRAST  Result Date: 01/07/2020 CLINICAL DATA:  74 year old female code stroke presentation earlier today, right ICA terminus occlusion treated with endovascular reperfusion. History of renal transplant. EXAM: MRA HEAD WITHOUT CONTRAST TECHNIQUE: Angiographic images of the Circle of Willis were obtained using MRA technique without intravenous contrast. COMPARISON:  Brain MRI and neck MRA today reported separately. FINDINGS: Antegrade flow in the posterior circulation with mildly dominant distal left vertebral artery. No distal  vertebral stenosis. Tortuous but patent vertebrobasilar junction. Patent bilateral PICA origins. The basilar artery is tortuous but patent without stenosis. Patent SCA and left PCA origins. There is a fetal type right PCA origin. Bilateral P1/posterior communicating arteries are tortuous. Bilateral PCA branches are within normal limits. Antegrade flow in both ICA siphons. Tortuous siphons with mild irregularity but no significant stenosis. Normal ophthalmic  and right posterior communicating artery origins. Patent and symmetric carotid termini. Normal MCA and ACA origins. Mildly tortuous A1 segments. Anterior communicating artery and visible ACA branches are within normal limits. MCA M1 segments and bifurcations are patent without stenosis. Visible bilateral MCA branches are within normal limits. IMPRESSION: Negative intracranial MRA status post endovascular treatment of right ICA terminus occlusion. Mild intracranial atherosclerosis without stenosis. Electronically Signed   By: Genevie Ann M.D.   On: 01/07/2020 19:01   MR ANGIO NECK WO CONTRAST  Result Date: 01/07/2020 CLINICAL DATA:  74 year old female code stroke presentation earlier today, right ICA terminus occlusion treated with endovascular reperfusion. History of renal transplant. EXAM: MRA NECK WITHOUT CONTRAST TECHNIQUE: Angiographic images of the neck were obtained using MRA technique without intravenous contrast. Carotid stenosis measurements (when applicable) are obtained utilizing NASCET criteria, using the distal internal carotid diameter as the denominator. COMPARISON:  Brain MRI today reported separately. FINDINGS: Time-of-flight images demonstrate a 3 vessel arch configuration. There is antegrade flow in both common carotid arteries. Both carotid bifurcations are patent. Antegrade flow in both cervical ICAs to the skull base. Only mild irregularity at the ICA origins and greater on the left. No hemodynamically significant carotid stenosis  identified. Antegrade flow in both cervical vertebral arteries, the left appears dominant. Vertebral artery origins not visible but no V2 or V3 vertebral artery stenosis identified. IMPRESSION: Patent cervical carotid and vertebral arteries with no evidence of hemodynamically significant stenosis on noncontrast MRA. Electronically Signed   By: Genevie Ann M.D.   On: 01/07/2020 18:57   MR BRAIN WO CONTRAST  Result Date: 01/07/2020 CLINICAL DATA:  74 year old female code stroke presentation earlier today, right ICA terminus occlusion treated with endovascular reperfusion. History of renal transplant. EXAM: MRI HEAD WITHOUT CONTRAST TECHNIQUE: Multiplanar, multiecho pulse sequences of the brain and surrounding structures were obtained without intravenous contrast. COMPARISON:  Presentation noncontrast head CT 1004 hours. FINDINGS: The examination had to be discontinued prior to completion by patient request. Axial and coronal DWI, axial SWI, and axial FLAIR imaging only was obtained. DWI reveals only a few small (10 mm or smaller) areas of restricted diffusion in the right hemisphere including portions of the right amygdala (series 7, image 57), posterosuperior right temporal gyrus (series 5, image 78), and punctate cortex of the right frontal operculum. No other restricted diffusion. No midline shift, mass effect, or evidence of intracranial mass lesion. No ventriculomegaly. No evidence of intracranial hemorrhage. Subtle FLAIR hyperintensity at the areas of abnormal DWI. Mild for age superimposed scattered white matter FLAIR hyperintensity elsewhere. No chronic cortical encephalomalacia. IMPRESSION: 1. Truncated exam by patient request.  DWI, SWI, and FLAIR obtained. 2. Only several small scattered areas of restricted diffusion are identified in the right hemisphere with mild FLAIR hyperintensity. No evidence of hemorrhage or mass effect. Electronically Signed   By: Genevie Ann M.D.   On: 01/07/2020 18:54   IR CT Head  Ltd  Result Date: 01/07/2020 INDICATION: 74 year old female with a history of acute stroke, right MCA occlusion suspected. Patient has a history of renal transplant, with no CT angiogram or perfusion performed. She presents for attempt at mechanical thrombectomy based on clinical CT mismatch. EXAM: ULTRASOUND GUIDED ACCESS RIGHT COMMON FEMORAL ARTERY CERVICAL AND CEREBRAL ANGIOGRAM MECHANICAL THROMBECTOMY RIGHT MCA ANGIO-SEAL FOR HEMOSTASIS COMPARISON:  Head CT 01/07/2020 MEDICATIONS: None ANESTHESIA/SEDATION: The anesthesia team was present to provide general endotracheal tube anesthesia and for patient monitoring during the procedure. Interventional neuro radiology nursing staff was also present. CONTRAST:  65 cc contrast FLUOROSCOPY TIME:  Fluoroscopy Time: 18 minutes 24 seconds (896 mGy). COMPLICATIONS: None TECHNIQUE: Informed written consent was obtained from the patient's family after a thorough discussion of the procedural risks, benefits and alternatives. Specific risks discussed include: Bleeding, infection, contrast reaction, kidney injury/failure, need for further procedure/surgery, arterial injury or dissection, embolization to new territory, intracranial hemorrhage (10-15% risk), neurologic deterioration, cardiopulmonary collapse, death. All questions were addressed. Maximal Sterile Barrier Technique was utilized including during the procedure including caps, mask, sterile gowns, sterile gloves, sterile drape, hand hygiene and skin antiseptic. A timeout was performed prior to the initiation of the procedure. The anesthesia team was present to provide general endotracheal tube anesthesia and for patient monitoring during the procedure. Interventional neuro radiology nursing staff was also present. FINDINGS: Initial Findings: Right internal carotid artery: Cervical ICA patent to the skull base. The horizontal petrous portion was occluded at the skull base on the initial angiogram. Right MCA:  Occlusion  Right ACA:  Occlusion Completion Findings: Right MCA: After treatment, there is restoration of flow through the cervical ICA segment, restoration of flow through the ICA terminus, and complete revascularization of the right MCA territory. TICI 3 Flat panel CT: No contrast staining, and no intraparenchymal hemorrhage or subarachnoid hemorrhage. PROCEDURE: The anesthesia team was present to provide general endotracheal tube anesthesia and for patient monitoring during the procedure. Intubation was performed in negative pressure [place] in neuro IR holding. Interventional neuro radiology nursing staff was also present. Ultrasound survey of the right inguinal region was performed with images stored and sent to PACs. 11 blade scalpel was used to make a small incision. Blunt dissection was performed with US guidance. A micropuncture needle was used access the right common femoral artery under ultrasound. With excellent arterial blood flow returned, an .018 micro wire was passed through the needle, observed to enter the abdominal aorta under fluoroscopy. The needle was removed, and a micropuncture sheath was placed over the wire. The inner dilator and wire were removed, and an 035 wire was advanced under fluoroscopy into the abdominal aorta. The sheath was removed and a 25cm 31F straight vascular sheath was placed. The dilator was removed and the sheath was flushed. Sheath was attached to pressurized and heparinized saline bag for constant forward flow. A coaxial system was then advanced over the 035 wire. This included a 95cm 087 "Walrus" balloon guide with coaxial 125cm Berenstein diagnostic catheter. This was advanced to the proximal descending thoracic aorta. Wire was then removed. Double flush of the catheter was performed. Catheter was then used to select the innominate artery. Angiogram was performed. Using roadmap technique, the catheter was advanced over a standard glide wire into right cervical ICA, with distal  position achieved of the balloon guide. The diagnostic catheter and the wire were removed. Formal angiogram was performed. Road map function was used once the occluded vessel was identified, with terminus occlusion identified. Copious back flush was performed and the balloon catheter was attached to heparinized and pressurized saline bag for forward flow. A second coaxial system was then advanced through the balloon catheter, which included the selected intermediate catheter, microcatheter, and microwire. In this scenario, the set up included a 71 zoom catheter, a Trevo Provue18 microcatheter, and 014 synchro soft wire. This system was advanced through the balloon guide catheter under the road-map function, with adequate back-flush at the rotating hemostatic valve at that back end of the balloon guide. Microcatheter and the intermediate catheter system were advanced through the terminal ICA into  the proximal M1 segment. The aspiration catheter was then advanced into the cavernous segment. The proprietary engine was then attached to the back end of the supra athero. Aspiration was then performed confirming blood return in the cavernous segment. The catheter was slowly advanced through the siphon until blood return halted. At this point the microcatheter and the microwire were removed from the system. Aspiration was performed for 2 minutes and the catheter was withdrawn. Once return of flow occurred at the tip of the catheter, the catheter was removed. Catheter was removed while aspiration was performed at the tip of the balloon guide. Aspiration catheter was removed from the system, the Touhy was removed, and aspiration was performed at the hub of the balloon guide confirming flow. Repeat angiogram was performed. This confirmed persisting partial occlusion in the M1 segment, with less than 50% reperfusion of the MCA territory. At this point there was restoration of flow into a fetal PCA. Fetal PCA had significant  perfusion of the temporal arteries, with leptomeningeal collateralization to the MCA territory. We then elected to perform a second pass. Coaxial system was then advanced through the balloon catheter, which included the selected intermediate catheter, microcatheter, and microwire. In this scenario, the set up included a 55 zoom catheter, a Trevo Provue18 microcatheter, and 014 synchro soft wire. This system was advanced through the balloon guide catheter under the road-map function, with adequate back-flush at the rotating hemostatic valve at that back end of the balloon guide. Microcatheter and the intermediate catheter system were advanced through the terminal ICA into the distal M1 segment. Microwire was then carefully advanced through the occluded segment. Microcatheter was then manipulated through the occluded segment and the wire was removed with saline drip at the hub. Blood was then aspirated through the hub of the microcatheter, and a gentle contrast injection was performed confirming intraluminal position. A rotating hemostatic valve was then attached to the back end of the microcatheter, and a pressurized and heparinized saline bag was attached to the catheter. 4 x 40 solitaire device was then selected. Back flush was achieved at the rotating hemostatic valve, and then the device was gently advanced through the microcatheter to the distal end. The retriever was then unsheathed by withdrawing the microcatheter under fluoroscopy. Once the retriever was completely unsheathed, the microcatheter was carefully stripped from the delivery device. Control angiogram was performed from the intermediate catheter. A 3 minute time interval was observed. The balloon at the balloon guide catheter was then inflated under fluoroscopy for proximal flow arrest. Constant aspiration using the proprietary engine was then performed at the intermediate catheter, as the retriever was gently and slowly withdrawn with fluoroscopic  observation. Once the retriever was "corked" within the tip of the intermediate catheter, both were removed from the system. Free aspiration was attempted at the hub of the balloon guide catheter, with no free blood return confirmed. The balloon was then deflated, with constant aspiration on the hub of the balloon guide. With no blood return, the balloon guide was slowly withdrawn from the cervical segment, through the aorta, and removed from the 8 French sheath. At this point flush was performed to remove a large embolus from the tip of the walrus balloon guide. JB 1 catheter was then advanced on a Glidewire to the aortic arch. Wire was removed and double flush was performed. JB diagnostic catheter was then used to select the cervical ICA. Angiogram was performed. Restoration of flow was confirmed, with TICI 3 flow. Angiogram of the cervical  ICA was performed. Balloon guide was then removed. The skin at the puncture site was then cleaned with Chlorhexidine. The 8 French sheath was removed and an 70F angioseal was deployed. Flat panel CT was performed. Patient was extubated once the CT was reviewed. Patient tolerated the procedure well and remained hemodynamically stable throughout. No complications were encountered and no significant blood loss encountered. IMPRESSION: Status post ultrasound guided access right common femoral artery for cervical and cerebral angiogram, mechanical thrombectomy of a right ICA terminus occlusion, and complete restoration of flow through the MCA territory (TICI 3). PLAN: ICU status Target systolic blood pressure of 120-140 Right hip straight time 6 hours Frequent neurovascular checks Repeat neurologic imaging with CT and/MRI at the discretion of neurology team Electronically Signed   By: Corrie Mckusick D.O.   On: 01/07/2020 12:41   IR US Guide Vasc Access Right  Result Date: 01/07/2020 INDICATION: 74 year old female with a history of acute stroke, right MCA occlusion suspected.  Patient has a history of renal transplant, with no CT angiogram or perfusion performed. She presents for attempt at mechanical thrombectomy based on clinical CT mismatch. EXAM: ULTRASOUND GUIDED ACCESS RIGHT COMMON FEMORAL ARTERY CERVICAL AND CEREBRAL ANGIOGRAM MECHANICAL THROMBECTOMY RIGHT MCA ANGIO-SEAL FOR HEMOSTASIS COMPARISON:  Head CT 01/07/2020 MEDICATIONS: None ANESTHESIA/SEDATION: The anesthesia team was present to provide general endotracheal tube anesthesia and for patient monitoring during the procedure. Interventional neuro radiology nursing staff was also present. CONTRAST:  65 cc contrast FLUOROSCOPY TIME:  Fluoroscopy Time: 18 minutes 24 seconds (896 mGy). COMPLICATIONS: None TECHNIQUE: Informed written consent was obtained from the patient's family after a thorough discussion of the procedural risks, benefits and alternatives. Specific risks discussed include: Bleeding, infection, contrast reaction, kidney injury/failure, need for further procedure/surgery, arterial injury or dissection, embolization to new territory, intracranial hemorrhage (10-15% risk), neurologic deterioration, cardiopulmonary collapse, death. All questions were addressed. Maximal Sterile Barrier Technique was utilized including during the procedure including caps, mask, sterile gowns, sterile gloves, sterile drape, hand hygiene and skin antiseptic. A timeout was performed prior to the initiation of the procedure. The anesthesia team was present to provide general endotracheal tube anesthesia and for patient monitoring during the procedure. Interventional neuro radiology nursing staff was also present. FINDINGS: Initial Findings: Right internal carotid artery: Cervical ICA patent to the skull base. The horizontal petrous portion was occluded at the skull base on the initial angiogram. Right MCA:  Occlusion Right ACA:  Occlusion Completion Findings: Right MCA: After treatment, there is restoration of flow through the cervical ICA  segment, restoration of flow through the ICA terminus, and complete revascularization of the right MCA territory. TICI 3 Flat panel CT: No contrast staining, and no intraparenchymal hemorrhage or subarachnoid hemorrhage. PROCEDURE: The anesthesia team was present to provide general endotracheal tube anesthesia and for patient monitoring during the procedure. Intubation was performed in negative pressure [place] in neuro IR holding. Interventional neuro radiology nursing staff was also present. Ultrasound survey of the right inguinal region was performed with images stored and sent to PACs. 11 blade scalpel was used to make a small incision. Blunt dissection was performed with US guidance. A micropuncture needle was used access the right common femoral artery under ultrasound. With excellent arterial blood flow returned, an .018 micro wire was passed through the needle, observed to enter the abdominal aorta under fluoroscopy. The needle was removed, and a micropuncture sheath was placed over the wire. The inner dilator and wire were removed, and an 035 wire was advanced under fluoroscopy  into the abdominal aorta. The sheath was removed and a 25cm 63F straight vascular sheath was placed. The dilator was removed and the sheath was flushed. Sheath was attached to pressurized and heparinized saline bag for constant forward flow. A coaxial system was then advanced over the 035 wire. This included a 95cm 087 "Walrus" balloon guide with coaxial 125cm Berenstein diagnostic catheter. This was advanced to the proximal descending thoracic aorta. Wire was then removed. Double flush of the catheter was performed. Catheter was then used to select the innominate artery. Angiogram was performed. Using roadmap technique, the catheter was advanced over a standard glide wire into right cervical ICA, with distal position achieved of the balloon guide. The diagnostic catheter and the wire were removed. Formal angiogram was performed. Road map  function was used once the occluded vessel was identified, with terminus occlusion identified. Copious back flush was performed and the balloon catheter was attached to heparinized and pressurized saline bag for forward flow. A second coaxial system was then advanced through the balloon catheter, which included the selected intermediate catheter, microcatheter, and microwire. In this scenario, the set up included a 71 zoom catheter, a Trevo Provue18 microcatheter, and 014 synchro soft wire. This system was advanced through the balloon guide catheter under the road-map function, with adequate back-flush at the rotating hemostatic valve at that back end of the balloon guide. Microcatheter and the intermediate catheter system were advanced through the terminal ICA into the proximal M1 segment. The aspiration catheter was then advanced into the cavernous segment. The proprietary engine was then attached to the back end of the supra athero. Aspiration was then performed confirming blood return in the cavernous segment. The catheter was slowly advanced through the siphon until blood return halted. At this point the microcatheter and the microwire were removed from the system. Aspiration was performed for 2 minutes and the catheter was withdrawn. Once return of flow occurred at the tip of the catheter, the catheter was removed. Catheter was removed while aspiration was performed at the tip of the balloon guide. Aspiration catheter was removed from the system, the Touhy was removed, and aspiration was performed at the hub of the balloon guide confirming flow. Repeat angiogram was performed. This confirmed persisting partial occlusion in the M1 segment, with less than 50% reperfusion of the MCA territory. At this point there was restoration of flow into a fetal PCA. Fetal PCA had significant perfusion of the temporal arteries, with leptomeningeal collateralization to the MCA territory. We then elected to perform a second  pass. Coaxial system was then advanced through the balloon catheter, which included the selected intermediate catheter, microcatheter, and microwire. In this scenario, the set up included a 55 zoom catheter, a Trevo Provue18 microcatheter, and 014 synchro soft wire. This system was advanced through the balloon guide catheter under the road-map function, with adequate back-flush at the rotating hemostatic valve at that back end of the balloon guide. Microcatheter and the intermediate catheter system were advanced through the terminal ICA into the distal M1 segment. Microwire was then carefully advanced through the occluded segment. Microcatheter was then manipulated through the occluded segment and the wire was removed with saline drip at the hub. Blood was then aspirated through the hub of the microcatheter, and a gentle contrast injection was performed confirming intraluminal position. A rotating hemostatic valve was then attached to the back end of the microcatheter, and a pressurized and heparinized saline bag was attached to the catheter. 4 x 40 solitaire device was  then selected. Back flush was achieved at the rotating hemostatic valve, and then the device was gently advanced through the microcatheter to the distal end. The retriever was then unsheathed by withdrawing the microcatheter under fluoroscopy. Once the retriever was completely unsheathed, the microcatheter was carefully stripped from the delivery device. Control angiogram was performed from the intermediate catheter. A 3 minute time interval was observed. The balloon at the balloon guide catheter was then inflated under fluoroscopy for proximal flow arrest. Constant aspiration using the proprietary engine was then performed at the intermediate catheter, as the retriever was gently and slowly withdrawn with fluoroscopic observation. Once the retriever was "corked" within the tip of the intermediate catheter, both were removed from the system. Free  aspiration was attempted at the hub of the balloon guide catheter, with no free blood return confirmed. The balloon was then deflated, with constant aspiration on the hub of the balloon guide. With no blood return, the balloon guide was slowly withdrawn from the cervical segment, through the aorta, and removed from the 8 French sheath. At this point flush was performed to remove a large embolus from the tip of the walrus balloon guide. JB 1 catheter was then advanced on a Glidewire to the aortic arch. Wire was removed and double flush was performed. JB diagnostic catheter was then used to select the cervical ICA. Angiogram was performed. Restoration of flow was confirmed, with TICI 3 flow. Angiogram of the cervical ICA was performed. Balloon guide was then removed. The skin at the puncture site was then cleaned with Chlorhexidine. The 8 French sheath was removed and an 82F angioseal was deployed. Flat panel CT was performed. Patient was extubated once the CT was reviewed. Patient tolerated the procedure well and remained hemodynamically stable throughout. No complications were encountered and no significant blood loss encountered. IMPRESSION: Status post ultrasound guided access right common femoral artery for cervical and cerebral angiogram, mechanical thrombectomy of a right ICA terminus occlusion, and complete restoration of flow through the MCA territory (TICI 3). PLAN: ICU status Target systolic blood pressure of 120-140 Right hip straight time 6 hours Frequent neurovascular checks Repeat neurologic imaging with CT and/MRI at the discretion of neurology team Electronically Signed   By: Corrie Mckusick D.O.   On: 01/07/2020 12:41   DG Chest Port 1 View  Result Date: 01/07/2020 CLINICAL DATA:  Acute ischemic stroke EXAM: PORTABLE CHEST 1 VIEW COMPARISON:  None. FINDINGS: Cardiomegaly. The hila and mediastinum are normal. No pneumothorax. No nodules or masses. The right lung is clear. Minimal opacity in left lung  may represent atelectasis. No other acute abnormalities. IMPRESSION: Cardiomegaly. Mild atelectasis suspected on the left. No other abnormalities. Electronically Signed   By: Dorise Bullion III M.D   On: 01/07/2020 15:27   ECHOCARDIOGRAM COMPLETE  Result Date: 01/08/2020    ECHOCARDIOGRAM REPORT   Patient Name:   Sarah Gilbert [facility] Date of Exam: 01/08/2020 Medical Rec #:  [id number]          Height:       65.0 in Accession #:    [id number]         Weight:       231.5 lb Date of Birth:  1946-08-17         BSA:          2.105 m Patient Age:    8 years           BP:           138/70  mmHg Patient Gender: F                  HR:           83 bpm. Exam Location:  Inpatient Procedure: 2D Echo, Cardiac Doppler and Color Doppler Indications:    CVA  History:        Patient has no prior history of Echocardiogram examinations.                 Stroke, Arrythmias:Atrial Fibrillation; Risk                 Factors:Hypertension and Dyslipidemia. Kidney transplant, ESRD.  Sonographer:    Dustin Flock Referring Phys: [id number] ASHISH ARORA IMPRESSIONS  1. Left ventricular ejection fraction, by estimation, is 60 to 65%. The left ventricle has normal function. The left ventricle has no regional wall motion abnormalities. There is moderate asymmetric left ventricular hypertrophy. Left ventricular diastolic function could not be evaluated.  2. Right ventricular systolic function is normal. The right ventricular size is normal. There is mildly elevated pulmonary artery systolic pressure.  3. Left atrial size was moderately dilated.  4. Right atrial size was moderately dilated.  5. The mitral valve is grossly normal. No evidence of mitral valve regurgitation. No evidence of mitral stenosis.  6. Tricuspid valve regurgitation is mild to moderate.  7. The aortic valve is normal in structure. Aortic valve regurgitation is trivial. No aortic stenosis is present. FINDINGS  Left Ventricle: Left ventricular ejection fraction, by estimation,  is 60 to 65%. The left ventricle has normal function. The left ventricle has no regional wall motion abnormalities. The left ventricular internal cavity size was normal in size. There is  moderate asymmetric left ventricular hypertrophy. Left ventricular diastolic function could not be evaluated due to atrial fibrillation. Left ventricular diastolic function could not be evaluated. Right Ventricle: The right ventricular size is normal. No increase in right ventricular wall thickness. Right ventricular systolic function is normal. There is mildly elevated pulmonary artery systolic pressure. The tricuspid regurgitant velocity is 2.88  m/s, and with an assumed right atrial pressure of 3 mmHg, the estimated right ventricular systolic pressure is 28.3 mmHg. Left Atrium: Left atrial size was moderately dilated. Right Atrium: Right atrial size was moderately dilated. Pericardium: There is no evidence of pericardial effusion. Mitral Valve: The mitral valve is grossly normal. No evidence of mitral valve regurgitation. No evidence of mitral valve stenosis. Tricuspid Valve: The tricuspid valve is grossly normal. Tricuspid valve regurgitation is mild to moderate. No evidence of tricuspid stenosis. Aortic Valve: The aortic valve is normal in structure. Aortic valve regurgitation is trivial. Aortic regurgitation PHT measures 571 msec. No aortic stenosis is present. Aortic valve peak gradient measures 15.7 mmHg. Pulmonic Valve: The pulmonic valve was normal in structure. Pulmonic valve regurgitation is not visualized. No evidence of pulmonic stenosis. Aorta: The aortic root and ascending aorta are structurally normal, with no evidence of dilitation. IAS/Shunts: The atrial septum is grossly normal.  LEFT VENTRICLE PLAX 2D LVIDd:         3.75 cm  Diastology LVIDs:         2.50 cm  LV e' lateral:   17.60 cm/s LV PW:         1.19 cm  LV E/e' lateral: 5.1 LV IVS:        1.47 cm  LV e' medial:    7.62 cm/s LVOT diam:     2.00 cm  LV  E/e' medial:  11.7 LV SV:         68 LV SV Index:   33 LVOT Area:     3.14 cm  RIGHT VENTRICLE RV Basal diam:  2.83 cm RV S prime:     10.60 cm/s TAPSE (M-mode): 3.2 cm LEFT ATRIUM             Index       RIGHT ATRIUM           Index LA diam:        3.40 cm 1.62 cm/m  RA Area:     24.90 cm LA Vol (A2C):   88.9 ml 42.23 ml/m RA Volume:   78.40 ml  37.25 ml/m LA Vol (A4C):   94.3 ml 44.80 ml/m LA Biplane Vol: 92.4 ml 43.90 ml/m  AORTIC VALVE AV Area (Vmax): 1.60 cm AV Vmax:        198.00 cm/s AV Peak Grad:   15.7 mmHg LVOT Vmax:      101.00 cm/s LVOT Vmean:     56.700 cm/s LVOT VTI:       0.218 m AI PHT:         571 msec  AORTA Ao Root diam: 2.90 cm MITRAL VALVE               TRICUSPID VALVE MV Area (PHT): 6.96 cm    TR Peak grad:   33.2 mmHg MV Decel Time: 109 msec    TR Vmax:        288.00 cm/s MV E velocity: 89.20 cm/s MV A velocity: 32.30 cm/s  SHUNTS MV E/A ratio:  2.76        Systemic VTI:  0.22 m                            Systemic Diam: 2.00 cm Mertie Moores MD Electronically signed by Mertie Moores MD Signature Date/Time: 01/08/2020/2:01:18 PM    Final    IR PERCUTANEOUS ART THROMBECTOMY/INFUSION INTRACRANIAL INC DIAG ANGIO  Result Date: 01/07/2020 INDICATION: 74 year old female with a history of acute stroke, right MCA occlusion suspected. Patient has a history of renal transplant, with no CT angiogram or perfusion performed. She presents for attempt at mechanical thrombectomy based on clinical CT mismatch. EXAM: ULTRASOUND GUIDED ACCESS RIGHT COMMON FEMORAL ARTERY CERVICAL AND CEREBRAL ANGIOGRAM MECHANICAL THROMBECTOMY RIGHT MCA ANGIO-SEAL FOR HEMOSTASIS COMPARISON:  Head CT 01/07/2020 MEDICATIONS: None ANESTHESIA/SEDATION: The anesthesia team was present to provide general endotracheal tube anesthesia and for patient monitoring during the procedure. Interventional neuro radiology nursing staff was also present. CONTRAST:  65 cc contrast FLUOROSCOPY TIME:  Fluoroscopy Time: 18 minutes 24 seconds  (896 mGy). COMPLICATIONS: None TECHNIQUE: Informed written consent was obtained from the patient's family after a thorough discussion of the procedural risks, benefits and alternatives. Specific risks discussed include: Bleeding, infection, contrast reaction, kidney injury/failure, need for further procedure/surgery, arterial injury or dissection, embolization to new territory, intracranial hemorrhage (10-15% risk), neurologic deterioration, cardiopulmonary collapse, death. All questions were addressed. Maximal Sterile Barrier Technique was utilized including during the procedure including caps, mask, sterile gowns, sterile gloves, sterile drape, hand hygiene and skin antiseptic. A timeout was performed prior to the initiation of the procedure. The anesthesia team was present to provide general endotracheal tube anesthesia and for patient monitoring during the procedure. Interventional neuro radiology nursing staff was also present. FINDINGS: Initial Findings: Right internal carotid artery: Cervical ICA patent to the skull base. The horizontal petrous portion  was occluded at the skull base on the initial angiogram. Right MCA:  Occlusion Right ACA:  Occlusion Completion Findings: Right MCA: After treatment, there is restoration of flow through the cervical ICA segment, restoration of flow through the ICA terminus, and complete revascularization of the right MCA territory. TICI 3 Flat panel CT: No contrast staining, and no intraparenchymal hemorrhage or subarachnoid hemorrhage. PROCEDURE: The anesthesia team was present to provide general endotracheal tube anesthesia and for patient monitoring during the procedure. Intubation was performed in negative pressure [place] in neuro IR holding. Interventional neuro radiology nursing staff was also present. Ultrasound survey of the right inguinal region was performed with images stored and sent to PACs. 11 blade scalpel was used to make a small incision. Blunt dissection was  performed with US guidance. A micropuncture needle was used access the right common femoral artery under ultrasound. With excellent arterial blood flow returned, an .018 micro wire was passed through the needle, observed to enter the abdominal aorta under fluoroscopy. The needle was removed, and a micropuncture sheath was placed over the wire. The inner dilator and wire were removed, and an 035 wire was advanced under fluoroscopy into the abdominal aorta. The sheath was removed and a 25cm 40F straight vascular sheath was placed. The dilator was removed and the sheath was flushed. Sheath was attached to pressurized and heparinized saline bag for constant forward flow. A coaxial system was then advanced over the 035 wire. This included a 95cm 087 "Walrus" balloon guide with coaxial 125cm Berenstein diagnostic catheter. This was advanced to the proximal descending thoracic aorta. Wire was then removed. Double flush of the catheter was performed. Catheter was then used to select the innominate artery. Angiogram was performed. Using roadmap technique, the catheter was advanced over a standard glide wire into right cervical ICA, with distal position achieved of the balloon guide. The diagnostic catheter and the wire were removed. Formal angiogram was performed. Road map function was used once the occluded vessel was identified, with terminus occlusion identified. Copious back flush was performed and the balloon catheter was attached to heparinized and pressurized saline bag for forward flow. A second coaxial system was then advanced through the balloon catheter, which included the selected intermediate catheter, microcatheter, and microwire. In this scenario, the set up included a 71 zoom catheter, a Trevo Provue18 microcatheter, and 014 synchro soft wire. This system was advanced through the balloon guide catheter under the road-map function, with adequate back-flush at the rotating hemostatic valve at that back end of  the balloon guide. Microcatheter and the intermediate catheter system were advanced through the terminal ICA into the proximal M1 segment. The aspiration catheter was then advanced into the cavernous segment. The proprietary engine was then attached to the back end of the supra athero. Aspiration was then performed confirming blood return in the cavernous segment. The catheter was slowly advanced through the siphon until blood return halted. At this point the microcatheter and the microwire were removed from the system. Aspiration was performed for 2 minutes and the catheter was withdrawn. Once return of flow occurred at the tip of the catheter, the catheter was removed. Catheter was removed while aspiration was performed at the tip of the balloon guide. Aspiration catheter was removed from the system, the Touhy was removed, and aspiration was performed at the hub of the balloon guide confirming flow. Repeat angiogram was performed. This confirmed persisting partial occlusion in the M1 segment, with less than 50% reperfusion of the MCA territory. At  this point there was restoration of flow into a fetal PCA. Fetal PCA had significant perfusion of the temporal arteries, with leptomeningeal collateralization to the MCA territory. We then elected to perform a second pass. Coaxial system was then advanced through the balloon catheter, which included the selected intermediate catheter, microcatheter, and microwire. In this scenario, the set up included a 55 zoom catheter, a Trevo Provue18 microcatheter, and 014 synchro soft wire. This system was advanced through the balloon guide catheter under the road-map function, with adequate back-flush at the rotating hemostatic valve at that back end of the balloon guide. Microcatheter and the intermediate catheter system were advanced through the terminal ICA into the distal M1 segment. Microwire was then carefully advanced through the occluded segment. Microcatheter was then  manipulated through the occluded segment and the wire was removed with saline drip at the hub. Blood was then aspirated through the hub of the microcatheter, and a gentle contrast injection was performed confirming intraluminal position. A rotating hemostatic valve was then attached to the back end of the microcatheter, and a pressurized and heparinized saline bag was attached to the catheter. 4 x 40 solitaire device was then selected. Back flush was achieved at the rotating hemostatic valve, and then the device was gently advanced through the microcatheter to the distal end. The retriever was then unsheathed by withdrawing the microcatheter under fluoroscopy. Once the retriever was completely unsheathed, the microcatheter was carefully stripped from the delivery device. Control angiogram was performed from the intermediate catheter. A 3 minute time interval was observed. The balloon at the balloon guide catheter was then inflated under fluoroscopy for proximal flow arrest. Constant aspiration using the proprietary engine was then performed at the intermediate catheter, as the retriever was gently and slowly withdrawn with fluoroscopic observation. Once the retriever was "corked" within the tip of the intermediate catheter, both were removed from the system. Free aspiration was attempted at the hub of the balloon guide catheter, with no free blood return confirmed. The balloon was then deflated, with constant aspiration on the hub of the balloon guide. With no blood return, the balloon guide was slowly withdrawn from the cervical segment, through the aorta, and removed from the 8 French sheath. At this point flush was performed to remove a large embolus from the tip of the walrus balloon guide. JB 1 catheter was then advanced on a Glidewire to the aortic arch. Wire was removed and double flush was performed. JB diagnostic catheter was then used to select the cervical ICA. Angiogram was performed. Restoration of flow  was confirmed, with TICI 3 flow. Angiogram of the cervical ICA was performed. Balloon guide was then removed. The skin at the puncture site was then cleaned with Chlorhexidine. The 8 French sheath was removed and an 51F angioseal was deployed. Flat panel CT was performed. Patient was extubated once the CT was reviewed. Patient tolerated the procedure well and remained hemodynamically stable throughout. No complications were encountered and no significant blood loss encountered. IMPRESSION: Status post ultrasound guided access right common femoral artery for cervical and cerebral angiogram, mechanical thrombectomy of a right ICA terminus occlusion, and complete restoration of flow through the MCA territory (TICI 3). PLAN: ICU status Target systolic blood pressure of 120-140 Right hip straight time 6 hours Frequent neurovascular checks Repeat neurologic imaging with CT and/MRI at the discretion of neurology team Electronically Signed   By: Corrie Mckusick D.O.   On: 01/07/2020 12:41   CT HEAD CODE STROKE WO CONTRAST  Result Date: 01/07/2020 CLINICAL DATA:  Code stroke.  Right MCA syndrome EXAM: CT HEAD WITHOUT CONTRAST TECHNIQUE: Contiguous axial images were obtained from the base of the skull through the vertex without intravenous contrast. COMPARISON:  01/28/2013 FINDINGS: Brain: No evidence of acute infarction, hemorrhage, hydrocephalus, extra-axial collection or mass lesion/mass effect. Vascular: Suspect hyperdense right supraclinoid ICA. Skull: Negative for fracture or bone lesion Sinuses/Orbits: Right cataract resection.  Gaze to the right. Other: These results were called by telephone at the time of interpretation on 01/07/2020 at 10:14 am to provider Rory Percy, who verbally acknowledged these results. ASPECTS [facility] Stroke Program Early CT Score) - Ganglionic level infarction (caudate, lentiform nuclei, internal capsule, insula, M1-M3 cortex): 7 - Supraganglionic infarction (M4-M6 cortex): 3 Total score (0-10  with 10 being normal): 10 IMPRESSION: 1. Concern for hyperdense right supraclinoid ICA. 2. No intracranial hemorrhage.  ASPECTS is 10. Electronically Signed   By: Monte Fantasia M.D.   On: 01/07/2020 10:15      HISTORY OF PRESENT ILLNESS Sarah Gilbert is an 74 y.o. female double kidney transplant (2011), HTN, A fib (on xarelto), HLD presenting with left side weakness from home where she lives with her uncle. She went to bed last night and was her usual self @ 2100 01/06/20 (LKW). She woke up this morning and was noted by family to have right gaze, left facial weakness, and slurred speech.  EMS was called-evaluated the patient-EMS activated code stroke-LVO positive. At baseline, patient walks with cane. She denies CP, SOB, HA, nausea vomiting fevers chills or exposure to anybody with Covid. Patient does take xarelto for her A. fib, daily-was last taken yesterday morning sometime. Her examination was consistent with large vessel occlusion stroke involving the right hemisphere. Stat head CT-aspects 10, no hemorrhage. BP 120/70. BG: 167. tPA was not given as she is on xarelto. Modified Rankin: Rankin Score=1. NIHSS:16. Given her double kidney transplant history, decision was made to take her directly for diagnostic cerebral angiogram and possible thrombectomy to have the least amount of contrast exposure.   HOSPITAL COURSE [name] is a 74 y.o. female with history of double kidney transplant (2011), HTN, . A fib (on xarelto), HLD presenting with L isded weakness, R gaze, L facial and slurred speech.    Stroke:   Severeal small R MCA infarcts embolic secondary to known AF off AC Code Stroke CT head hyperdense R supraclinoid ICA. ASPECTS 10.    Cerebral angio R ICA terminus thrombus s/p TICI3 revascularization w/ 2 passes Earleen Newport) Post IR CT no hemorrhage, no statin  MRI  Several small scattered R hemisphere infarcts MRA head mild intracranial atherosclerosis  MRA neck Unremarkable  2D  Echo EF 60-65%. No source of embolus. LA dilated LDL 74 HgbA1c 6.1 Xarelto (rivaroxaban) daily prior to admission but had missed 3-4 days, now on Eliquis 5 mg bid.  Therapy recommendations:  HH PT, HH OT - [facility] Disposition:  return home  Atrial Fibrillation Home anticoagulation:  Xarelto (rivaroxaban) daily- she had missed 3-4 days of meds PTA as Dr. Terrence Dupont supplies it to her and office did not have any more Now on Eliquis 5 mg bid ($40/mo copay)  Hypertension Home meds:  amlodpine 5, lasix 40, metoprolol 50 bid    Resume home meds  BP goal normotensive   Hyperlipidemia Home meds:  lipitor 20 Statin increased to lipitor 40 LDL 74, goal < 70 Continue statin at discharge   Other Stroke Risk Factors Advanced age Obesity, Body mass index  is 38.52 kg/m., recommend weight loss, diet and exercise as appropriate    Other Active Problems ESRD s/p B kidney transplant on prograf and cellcept. Not on HD Thyroid dz   Hypokalemia 3.3->3.4->3.3 - supplemented   DISCHARGE EXAM Blood pressure 136/76, pulse 69, temperature 98 F (36.7 C), temperature source Oral, resp. rate 18, height 5\' 5"  (1.651 m), weight 105 kg, SpO2 99 %. Pleasant elderly African-American lady who is not in distress. Afebrile. Head is nontraumatic. Neck is supple without bruit.    Cardiac exam no murmur or gallop. Lungs are clear to auscultation. Distal pulses are well felt. Neurological Exam ;  Awake  Alert oriented x 3. Normal speech and language.eye movements full without nystagmus.fundi were not visualized. Vision acuity and fields appear normal. Hearing is normal. Palatal movements are normal. Face symmetric. Tongue midline. Normal strength, tone, reflexes and coordination.  Diminished fine finger movements on the left.  Orbits right over left upper extremity.  Mild weakness of left grip.  Slight diminished foot tapping on the left.  Normal sensation. Gait deferred. NIHSS 0  Discharge Diet   Heart healthy thin  liquids  DISCHARGE PLAN Disposition:  Return home Home health PT and OT - [facility] Eliquis (apixaban) daily for secondary stroke prevention  Ongoing stroke risk factor control by Primary Care Physician at time of discharge Follow-up PCP [name], FNP in 2 weeks. Follow-up in [facility] in 4 weeks, office to schedule an appointment.   32 minutes were spent preparing discharge.  Burnetta Sabin, MSN, APRN, ANVP-BC, AGPCNP-BC Advanced Practice Stroke Nurse [place] for Schedule & Pager information 01/09/2020 2:55 PM   I have personally obtained history,examined this patient, reviewed notes, independently viewed imaging studies, participated in medical decision making and plan of care.ROS completed by me personally and pertinent positives fully documented  I have made any additions or clarifications directly to the above note. Agree with note above.    Antony Contras, MD Medical Director [facility] Pager: [phone] 01/09/2020 3:28 PM

## 2020-01-08 NOTE — TOC Benefit Eligibility Note (Signed)
Transition of Care [facility]) Benefit Eligibility Note    Patient Details  Name: Sarah Gilbert MRN: [id number] Date of Birth: 1946/01/30   Medication/Dose: Arne Cleveland  5 MG BID  Covered?: Yes  Tier: 3 Drug  Prescription Coverage Preferred Pharmacy: [facility] with Person/Company/Phone Number:: JEN  @  [employer]   #   [phone]  Co-Pay: $40.00  Prior Approval: No  Deductible: Met  Additional Notes: WARFARIN 9 MG DAILY COVER- [place]   TIER-1 DRUG  P/A- NO    Memory Argue Phone Number: 01/08/2020, 5:06 PM

## 2020-01-08 NOTE — Progress Notes (Signed)
  Echocardiogram 2D Echocardiogram has been performed.  Sarah Gilbert 01/08/2020, 1:31 PM

## 2020-01-08 NOTE — Evaluation (Signed)
Occupational Therapy Evaluation Patient Details Name: Sarah Gilbert MRN: [id number] DOB: Sep 17, 1945 Today's Date: 01/08/2020    History of Present Illness Sarah Gilbert is an 74 y.o. female double kidney transplant (2011), HTN, . A fib (on xarelto), HLD who presented as a code stroke. For left side weakness. 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 Pt underwent thrombectomy in IR.   Clinical Impression   This 74 y/o female presents with the above. PTA pt reports mod independence with ADL and functional mobility using SPC (reports mostly using in the community). Pt very pleasant and willing to work with therapy today. Overall pt requiring minA for mobility tasks via HHA and with RW. Pt requiring minA for LB, toileting and standing grooming ADL tasks. Pt with gross LUE weakness however pt demonstrates ability to use bil UEs functionally and without notable difficulties. She will benefit from continued acute OT services and currently recommend follow up [facility] services after discharge to progress pt towards her PLOF. Will follow.   Follow Up Recommendations  Home health OT;Supervision/Assistance - 24 hour    Equipment Recommendations  Tub/shower seat           Precautions / Restrictions Precautions Precautions: Fall Restrictions Weight Bearing Restrictions: No      Mobility Bed Mobility Overal bed mobility: Needs Assistance Bed Mobility: Supine to Sit     Supine to sit: Supervision     General bed mobility comments: for lines and safety, supine>sit from flat bed   Transfers Overall transfer level: Needs assistance Equipment used: 1 person hand held assist;2 person hand held assist Transfers: Sit to/from Stand Sit to Stand: Min guard         General transfer comment: min guard for safety for first time up, pt with wide base of support, mildly unsteady upon standing but able to gain balance    Balance Overall balance assessment: Needs  assistance Sitting-balance support: Feet supported;No upper extremity supported Sitting balance-Leahy Scale: Fair     Standing balance support: Single extremity supported;During functional activity Standing balance-Leahy Scale: Fair Standing balance comment: pt observed working with OT at sink and able to perform pericare                           ADL either performed or assessed with clinical judgement   ADL Overall ADL's : Needs assistance/impaired Eating/Feeding: Modified independent;Sitting   Grooming: Wash/dry hands;Minimal assistance;Standing Grooming Details (indicate cue type and reason): for balance  Upper Body Bathing: Set up;Min guard;Sitting   Lower Body Bathing: Minimal assistance;Sit to/from stand   Upper Body Dressing : Set up;Supervision/safety;Sitting   Lower Body Dressing: Minimal assistance;Sit to/from stand Lower Body Dressing Details (indicate cue type and reason): pt reaching forward to adjust socks seated EOB, minA for standing balance  Toilet Transfer: Minimal assistance;+2 for safety/equipment;Ambulation;Regular Toilet;Grab bars   Toileting- Clothing Manipulation and Hygiene: Minimal assistance;Sitting/lateral lean;Sit to/from stand Toileting - Clothing Manipulation Details (indicate cue type and reason): pt performing pericare via lateral leans seated on toilet      Functional mobility during ADLs: Minimal assistance;+2 for safety/equipment;Rolling walker General ADL Comments: pt with generalized weakness, ?possible L inattention, decreased standing balance as pt typically ambulatory with/without SPC     Vision Baseline Vision/History: Wears glasses Wears Glasses: At all times Patient Visual Report: No change from baseline Vision Assessment?: Yes Eye Alignment: Within Functional Limits Ocular Range of Motion: Within Functional Limits Alignment/Gaze Preference: Within Defined Limits  Tracking/Visual Pursuits: Able to track stimulus in all  quads without difficulty Additional Comments: ?questionable L inattention, will continue to assess      Perception     Praxis      Pertinent Vitals/Pain Pain Assessment: No/denies pain     Hand Dominance Right   Extremity/Trunk Assessment Upper Extremity Assessment Upper Extremity Assessment: LUE deficits/detail LUE Deficits / Details: gross weakness compared to RUE   Lower Extremity Assessment Lower Extremity Assessment: Defer to PT evaluation   Cervical / Trunk Assessment Cervical / Trunk Assessment: Normal   Communication Communication Communication: No difficulties   Cognition Arousal/Alertness: Awake/alert Behavior During Therapy: WFL for tasks assessed/performed Overall Cognitive Status: Within Functional Limits for tasks assessed                                 General Comments: pt oriented and with good safety awareness   General Comments  some bruising on UEs, 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    Exercises     Shoulder Instructions      Home Living Family/patient expects to be discharged to:: Private residence Living Arrangements: Other relatives(lives with uncle ) Available Help at Discharge: Family Type of Home: House Home Access: Ramped entrance     Home Layout: One level     Bathroom Shower/Tub: Teacher, early years/pre: Standard     Home Equipment: Cane - single point;Bedside commode          Prior Functioning/Environment Level of Independence: Independent with assistive device(s)        Comments: uses SPC outside of the home, ambulatory without AD inside the home, driving PTA         OT Problem List: Decreased strength;Decreased range of motion;Decreased activity tolerance;Impaired balance (sitting and/or standing);Decreased knowledge of use of DME or AE;Obesity      OT Treatment/Interventions: Self-care/ADL training;Therapeutic exercise;Energy conservation;DME and/or AE instruction;Therapeutic  activities;Cognitive remediation/compensation;Patient/family education;Balance training    OT Goals(Current goals can be found in the care plan section) Acute Rehab OT Goals Patient Stated Goal: home ASAP OT Goal Formulation: With patient Time For Goal Achievement: 01/22/20 Potential to Achieve Goals: Good  OT Frequency: Min 2X/week   Barriers to D/C:            Co-evaluation PT/OT/SLP Co-Evaluation/Treatment: Yes Reason for Co-Treatment: For patient/therapist safety;To address functional/ADL transfers   OT goals addressed during session: ADL's and self-care      AM-PAC OT "6 Clicks" Daily Activity     Outcome Measure Help from another person eating meals?: None Help from another person taking care of personal grooming?: A Little Help from another person toileting, which includes using toliet, bedpan, or urinal?: A Little Help from another person bathing (including washing, rinsing, drying)?: A Little Help from another person to put on and taking off regular upper body clothing?: A Little Help from another person to put on and taking off regular lower body clothing?: A Little 6 Click Score: 19   End of Session Equipment Utilized During Treatment: Gait belt;Rolling walker Nurse Communication: Mobility status  Activity Tolerance: Patient tolerated treatment well Patient left: in chair;with call bell/phone within reach;with chair alarm set;with nursing/sitter in room(RN and MD in room end of session)  OT Visit Diagnosis: Unsteadiness on feet (R26.81);Muscle weakness (generalized) (M62.81)                Time: 1696-7893 OT Time Calculation (min): 27 min  Charges:  OT General Charges $OT Visit: 1 Visit OT Evaluation $OT Eval Moderate Complexity: [place], OT Acute Rehabilitation Services Pager [phone] Office [phone]   Raymondo Band 01/08/2020, 2:38 PM

## 2020-01-08 NOTE — Discharge Instructions (Signed)

## 2020-01-09 LAB — CBC
HCT: 36.5 % (ref 36.0–46.0)
Hemoglobin: 12 g/dL (ref 12.0–15.0)
MCH: 31 pg (ref 26.0–34.0)
MCHC: 32.9 g/dL (ref 30.0–36.0)
MCV: 94.3 fL (ref 80.0–100.0)
Platelets: 224 10*3/uL (ref 150–400)
RBC: 3.87 MIL/uL (ref 3.87–5.11)
RDW: 12.6 % (ref 11.5–15.5)
WBC: 5.8 10*3/uL (ref 4.0–10.5)
nRBC: 0 % (ref 0.0–0.2)

## 2020-01-09 LAB — BASIC METABOLIC PANEL
Anion gap: 9 (ref 5–15)
BUN: <5 mg/dL — ABNORMAL LOW (ref 8–23)
CO2: 24 mmol/L (ref 22–32)
Calcium: 9.2 mg/dL (ref 8.9–10.3)
Chloride: 109 mmol/L (ref 98–111)
Creatinine, Ser: 0.79 mg/dL (ref 0.44–1.00)
GFR calc Af Amer: >60 mL/min (ref >60)
GFR calc non Af Amer: >60 mL/min (ref >60)
Glucose, Bld: 100 mg/dL — ABNORMAL HIGH (ref 70–99)
Potassium: 3.3 mmol/L — ABNORMAL LOW (ref 3.5–5.1)
Sodium: 142 mmol/L (ref 135–145)

## 2020-01-09 MED ORDER — ATORVASTATIN CALCIUM 40 MG PO TABS: 40.0000 mg | ORAL_TABLET | Freq: Every day | ORAL | 2 refills | Status: AC

## 2020-01-09 MED ORDER — APIXABAN 5 MG PO TABS: 5.0000 mg | ORAL_TABLET | Freq: Two times a day (BID) | ORAL | 2 refills | Status: AC

## 2020-01-09 MED FILL — ELIQUIS 5 MG TABLET: 5 | 30 days supply | Qty: 60 | Fill #0

## 2020-01-09 MED FILL — ATORVASTATIN CALCIUM 40 MG: 40 | 30 days supply | Qty: 30 | Fill #0

## 2020-01-09 NOTE — Progress Notes (Signed)
Physical Therapy Treatment Patient Details Name: Sarah Gilbert MRN: [id number] DOB: 15-Aug-1946 Today's Date: 01/09/2020    History of Present Illness Sarah Gilbert is an 74 y.o. female double kidney transplant (2011), HTN, . A fib (on xarelto), HLD who presented as a code stroke. For left side weakness. 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 Pt underwent thrombectomy in IR.    PT Comments    Patient is making progress toward PT goals and tolerated increased gait distance well. Pt with mild SOB however reports this is baseline with ambulation. Pt continues to be more steady with use of UE support and agreeable to use of RW upon discharge for decreased risk of falls. Pt will continue to benefit from further skilled PT services to maximize independence and safety with mobility.     Follow Up Recommendations  Home health PT;Supervision/Assistance - 24 hour     Equipment Recommendations  Rolling walker with 5" wheels    Recommendations for Other Services       Precautions / Restrictions Precautions Precautions: Fall Restrictions Weight Bearing Restrictions: No    Mobility  Bed Mobility Overal bed mobility: Modified Independent                Transfers Overall transfer level: Needs assistance Equipment used: Rolling walker (2 wheeled) Transfers: Sit to/from Stand Sit to Stand: Supervision         General transfer comment: able to stand with supervision and RW use upon standing  Ambulation/Gait Ambulation/Gait assistance: Min guard Gait Distance (Feet): 225 Feet Assistive device: Rolling walker (2 wheeled) Gait Pattern/deviations: Step-through pattern;Decreased stride length;Wide base of support Gait velocity: decreased   General Gait Details: min guard for safety with RW; cues for maintaining closer proximity to RW; pt able to ambulate short distance around bed without AD but more steady with UE support    Stairs             Wheelchair  Mobility    Modified Rankin (Stroke Patients Only) Modified Rankin (Stroke Patients Only) Pre-Morbid Rankin Score: No significant disability Modified Rankin: Slight disability     Balance Overall balance assessment: Needs assistance Sitting-balance support: Feet supported;No upper extremity supported Sitting balance-Leahy Scale: Fair     Standing balance support: Single extremity supported;During functional activity Standing balance-Leahy Scale: Fair                              Cognition Arousal/Alertness: Awake/alert Behavior During Therapy: WFL for tasks assessed/performed Overall Cognitive Status: Within Functional Limits for tasks assessed                                        Exercises      General Comments        Pertinent Vitals/Pain Pain Assessment: No/denies pain    Home Living                      Prior Function            PT Goals (current goals can now be found in the care plan section) Progress towards PT goals: Progressing toward goals    Frequency    Min 4X/week      PT Plan Current plan remains appropriate    Co-evaluation  AM-PAC PT "6 Clicks" Mobility   Outcome Measure  Help needed turning from your back to your side while in a flat bed without using bedrails?: A Little Help needed moving from lying on your back to sitting on the side of a flat bed without using bedrails?: A Little Help needed moving to and from a bed to a chair (including a wheelchair)?: A Little Help needed standing up from a chair using your arms (e.g., wheelchair or bedside chair)?: A Little Help needed to walk in hospital room?: A Little Help needed climbing 3-5 steps with a railing? : A Little 6 Click Score: 18    End of Session Equipment Utilized During Treatment: Gait belt Activity Tolerance: Patient tolerated treatment well Patient left: in chair;with call bell/phone within reach Nurse  Communication: Mobility status PT Visit Diagnosis: Unsteadiness on feet (R26.81);Muscle weakness (generalized) (M62.81);History of falling (Z91.81);Ataxic gait (R26.0);Difficulty in walking, not elsewhere classified (R26.2)     Time: 7741-2878 PT Time Calculation (min) (ACUTE ONLY): 44 min  Charges:  $Gait Training: 23-37 mins                     Earney Navy, PTA Acute Rehabilitation Services Pager: [phone] Office: [phone]     Darliss Cheney 01/09/2020, 3:38 PM

## 2020-01-09 NOTE — Progress Notes (Signed)
Patient being discharged home, educated on discharge instructions. Patient left with script, home health, equipment, and all belongings.

## 2020-01-09 NOTE — TOC Transition Note (Addendum)
Transition of Care [facility]) - CM/SW Discharge Note   Patient Details  Name: Dustyn Armbrister MRN: [id number] Date of Birth: Jul 13, 1946  [facility] of [facility]) CM/SW Contact:  Pollie Friar, RN Phone Number: 01/09/2020, 10:58 AM   Clinical Narrative:    Pt with orders for [facility] service. CM provided choice and [facility] decided on. Cory with [facility] accepted the referral.  Pt has recommended DME at home.  She denies any issues with her home medications or with transportation. If meds delivered to room per TOC she wont need a 30 day Eliquis card. If she picks up her prescriptions outside the hospital she will need the card. CM following.  Pt decided she wants 3 n 1 and walker for home. DME ordered through [employer] and will be delivered to the room.    Final next level of care: Home w Home Health Services Barriers to Discharge: No Barriers Identified   Patient Goals and CMS Choice   CMS Medicare.gov Compare Post Acute Care list provided to:: Patient Choice offered to / list presented to : Patient  Discharge Placement                       Discharge Plan and Services                          HH Arranged: PT, OT [facility] Agency: [place] Date [place]: 01/09/20   Representative spoke with at [place]: [place] ([place]) Interventions     Readmission Risk Interventions No flowsheet data found.

## 2020-01-10 ENCOUNTER — Telehealth: Payer: Self-pay

## 2020-01-10 NOTE — Telephone Encounter (Signed)
Transition Care Management Follow-up Telephone Call  Date of discharge and from where: 01/09/20  How have you been since you were released from the hospital? Very well  Any questions or concerns? No  Items Reviewed:  Did the pt receive and understand the discharge instructions provided? Yes  Medications obtained and verified? Yes and yes  Any new allergies since your discharge? No  Dietary orders reviewed? Yes  Do you have support at home? Yes  Other (ie: DME, Home Health, etc)   Functional Questionnaire: (I = Independent and D = Dependent) ADL's: I  Bathing/Dressing- I   Meal Prep- I  Eating- I  Maintaining continence- I  Transferring/Ambulation- I  Managing Meds- I   Follow up appointments reviewed:    PCP Hospital f/u appt confirmed? Yes  Specialist Hospital f/u appt confirmed?  Yes  Are transportation arrangements needed? No  If their condition worsens, is the pt aware to call  their PCP or go to the ED? Yes  Was the patient provided with contact information for the PCP's office or ED? Yes  Was the pt encouraged to call back with questions or concerns? Yes

## 2020-01-10 NOTE — Telephone Encounter (Signed)
After finishing hospital f/u questions pt stated she had a question for you she is asking how long is she suppose to keep the bandage on her (R) groin area? She was not told when to take it off

## 2020-01-22 DIAGNOSIS — I69392 Facial weakness following cerebral infarction: Secondary | ICD-10-CM

## 2020-01-22 DIAGNOSIS — I69354 Hemiplegia and hemiparesis following cerebral infarction affecting left non-dominant side: Secondary | ICD-10-CM | POA: Diagnosis not present

## 2020-01-22 DIAGNOSIS — Z48812 Encounter for surgical aftercare following surgery on the circulatory system: Secondary | ICD-10-CM

## 2020-01-22 DIAGNOSIS — I69328 Other speech and language deficits following cerebral infarction: Secondary | ICD-10-CM

## 2020-01-24 ENCOUNTER — Other Ambulatory Visit: Payer: Self-pay

## 2020-01-24 ENCOUNTER — Encounter: Payer: Self-pay | Admitting: Nurse Practitioner

## 2020-01-24 ENCOUNTER — Ambulatory Visit (INDEPENDENT_AMBULATORY_CARE_PROVIDER_SITE_OTHER): Payer: Medicare Other | Admitting: Nurse Practitioner

## 2020-01-24 VITALS — BP 130/82 | HR 86 | Temp 98.5°F | Ht 63.0 in | Wt 218.6 lb

## 2020-01-24 DIAGNOSIS — D171 Benign lipomatous neoplasm of skin and subcutaneous tissue of trunk: Secondary | ICD-10-CM | POA: Diagnosis not present

## 2020-01-24 DIAGNOSIS — I639 Cerebral infarction, unspecified: Secondary | ICD-10-CM

## 2020-01-24 NOTE — Progress Notes (Signed)
Subjective:     Patient ID: Sarah Gilbert , female    DOB: August 01, 1946 , 74 y.o.   MRN: [id number]   Chief Complaint  Patient presents with  . Hospitalization Follow-up    Stroke    HPI  Sarah Gilbert presents today for previous hospitalization from 5/23 - 5/25 following an embolic stroke after having left side weakness while at home.  She had gone to bed her usual self the night before and woke the next morning noted to have a right gaze, left facial weakness and slurred speech.  She had not taken her medications for Atrial Fibrillation on her own.  She was admitted for a total of three days in the hospital. She is currently receiving PT/OT rehab at home and reports that she is doing well and has one more visit left. With her last OT therapy already done based on the initial assessment.  Stroke-The event occurred because she missed a dose of Xarelto. She is now on eliquis and understands the importance of compliance. Her Lipitor was also increased to 40 mg since the stroke.  She has not been to a follow up to any specialist yet but states that she has an upcoming appointment with the vascular doctor. She is watching the salt at home and is drinking more water than before the stroke. She is drinking less soda, and eating less pork products. She is having the diarrhea bouts but says that it is inconsistent and she states that she feels that the lipoma on her back is getting bigger since the stroke.    Hypertension This is a chronic problem. The current episode started more than 1 year ago. The problem is unchanged. The problem is controlled. Pertinent negatives include no anxiety, chest pain or palpitations. There are no associated agents to hypertension. There are no known risk factors for coronary artery disease. Past treatments include beta blockers. The current treatment provides moderate improvement. There are no compliance problems.  Hypertensive end-organ damage includes CVA. There is no  history of angina. There is no history of chronic renal disease.  Diarrhea  This is a recurrent problem. The current episode started today. The problem occurs less than 2 times per day. The problem has been unchanged. The stool consistency is described as watery. The patient states that diarrhea does not awaken her from sleep. Associated symptoms include bloating. Nothing aggravates the symptoms. There are no known risk factors. She has tried anti-motility drug for the symptoms. The treatment provided mild relief.    l.   The patient's tobacco use is:  Social History   Tobacco Use  Smoking Status Never Smoker  Smokeless Tobacco Never Used   She has been exposed to passive smoke. The patient's alcohol use is:  Social History   Substance and Sexual Activity  Alcohol Use No    Past Medical History:  Diagnosis Date  . Anemia   . Arthritis   . Atrial fibrillation ([place])   . Bleeding nose    history of   . ESRD (end stage renal disease) ([place])   . History of bronchitis   . Hyperlipemia   . Hypertension   . Joint inflammation   . Obesity   . PONV (postoperative nausea and vomiting)   . Rotator cuff tear arthropathy of right shoulder 06/18/2015  . Thyroid disease   . Wears glasses      Family History  Problem Relation Age of Onset  . Dementia Mother   . Cancer Father  Current Outpatient Medications:  .  amLODipine (NORVASC) 10 MG tablet, Take 5 mg by mouth daily. , Disp: , Rfl:  .  apixaban (ELIQUIS) 5 MG TABS tablet, Take 1 tablet (5 mg total) by mouth 2 (two) times daily., Disp: 60 tablet, Rfl: 2 .  atorvastatin (LIPITOR) 40 MG tablet, Take 1 tablet (40 mg total) by mouth daily., Disp: 30 tablet, Rfl: 2 .  b complex-vitamin c-folic acid (NEPHRO-VITE) 0.8 MG TABS, Take 0.8 mg by mouth at bedtime., Disp: , Rfl:  .  furosemide (LASIX) 40 MG tablet, Take 40 mg by mouth daily. , Disp: , Rfl:  .  magnesium gluconate (MAGONATE) 500 MG tablet, Take 500 mg by mouth 2 (two) times  daily., Disp: , Rfl:  .  metoprolol (LOPRESSOR) 50 MG tablet, Take 50 mg by mouth 2 (two) times daily., Disp: , Rfl:  .  mycophenolate (CELLCEPT) 250 MG capsule, Take 500 mg by mouth 2 (two) times daily., Disp: , Rfl:  .  pantoprazole (PROTONIX) 40 MG tablet, Take 40 mg by mouth daily. , Disp: , Rfl:  .  potassium chloride SA (K-DUR,KLOR-CON) 20 MEQ tablet, Take 20 mEq by mouth daily. , Disp: , Rfl:  .  tacrolimus (PROGRAF) 0.5 MG capsule, Take 0.5 mg by mouth 2 (two) times daily. Take one capsule along with the 1mg  capsule, Disp: , Rfl:  .  tacrolimus (PROGRAF) 1 MG capsule, Take 2 mg by mouth 2 (two) times daily. , Disp: , Rfl:    Allergies  Allergen Reactions  . Vancomycin Hives, Itching, Rash and Other (See Comments)    Blistering, swelling, and peeling of feet.  Pt denies any similar reactions around face or neck.  . Iohexol      Desc: iodine contrast/per dialysis center, Onset Date: [id number]      Review of Systems  Constitutional: Negative.   HENT: Negative.   Eyes: Negative.   Respiratory: Negative.   Cardiovascular: Positive for leg swelling. Negative for chest pain and palpitations.       Right ankle is swollen and is chronic before stroke.  Gastrointestinal: Positive for bloating and diarrhea.  Endocrine: Negative.  Negative for polydipsia, polyphagia and polyuria.  Genitourinary: Negative.   Musculoskeletal: Negative.   Skin: Positive for rash (dry itching rash behind left knee).       Left posterior back with soft mass she feels like slightly larger since her stroke  Allergic/Immunologic: Negative.   Neurological: Negative.  Negative for dizziness.       No residual symptoms following the stroke.  Hematological: Negative.   Psychiatric/Behavioral: Negative.      Today's Vitals   01/24/20 1525  BP: 130/82  Pulse: 86  Temp: 98.5 F (36.9 C)  TempSrc: Oral  SpO2: 94%  Weight: 218 lb 9.6 oz (99.2 kg)  Height: 5\' 3"  (1.6 m)  PainSc: 0-No pain   Body mass index  is 38.72 kg/m.   Objective:  Physical Exam Constitutional:      Appearance: Normal appearance. She is obese.  Cardiovascular:     Rate and Rhythm: Rhythm irregular.     Heart sounds: No murmur heard.  No friction rub. No gallop.      Comments: Afib/Aflutter Pulmonary:     Effort: Pulmonary effort is normal.     Breath sounds: Normal breath sounds. No wheezing or rales.  Musculoskeletal:        General: Swelling present. Normal range of motion.     Comments: Right ankle brace present  Skin:    General: Skin is warm and dry.     Capillary Refill: Capillary refill takes less than 2 seconds.     Comments: Large lipoma present on the mid upper back measuring 10 cm x 10.5 cm  Neurological:     Mental Status: She is alert and oriented to person, place, and time.     Sensory: No sensory deficit.     Motor: No weakness.     Coordination: Coordination abnormal (mild dysmetria left hand).     Gait: Gait normal.  Psychiatric:        Mood and Affect: Mood normal.        Behavior: Behavior normal.        Thought Content: Thought content normal.        Judgment: Judgment normal.         Assessment And Plan:   1. Ischemic stroke ([place]) TCM Performed. A member of the clinical team spoke with the patient upon dischare. Discharge summary was reviewed in full detail during the visit. Meds reconciled and compared to discharge meds. Medication list is updated and reviewed with the patient.  Greater than 50% face to face time was spent in counseling an coordination of care.  All questions were answered to the satisfaction of the patient.   Stroke occurred on right side she does have mild left hand dysmetria.    2. Lipoma of back  Continues to have lipoma which appears slightly larger, explained this could be related to her recent weight loss of about 12 lbs since being in the hospital and has caused this to be more prominent      Marylu Lund, RN

## 2020-02-13 ENCOUNTER — Ambulatory Visit: Payer: Medicare Other | Admitting: Adult Health

## 2020-02-13 ENCOUNTER — Encounter: Payer: Self-pay | Admitting: Adult Health

## 2020-02-13 VITALS — BP 143/80 | HR 86 | Ht 65.0 in | Wt 225.0 lb

## 2020-02-13 DIAGNOSIS — I639 Cerebral infarction, unspecified: Secondary | ICD-10-CM | POA: Diagnosis not present

## 2020-02-13 DIAGNOSIS — E782 Mixed hyperlipidemia: Secondary | ICD-10-CM | POA: Diagnosis not present

## 2020-02-13 DIAGNOSIS — Z9189 Other specified personal risk factors, not elsewhere classified: Secondary | ICD-10-CM

## 2020-02-13 DIAGNOSIS — I4811 Longstanding persistent atrial fibrillation: Secondary | ICD-10-CM

## 2020-02-13 DIAGNOSIS — I10 Essential (primary) hypertension: Secondary | ICD-10-CM

## 2020-02-13 NOTE — Progress Notes (Signed)
[facility] [address] [place]. [place] [address] [phone]       HOSPITAL FOLLOW UP NOTE  [name] Date of Birth:  30-Jul-1946 Medical Record Number:  [id number]   Reason for Referral:  hospital stroke follow up    SUBJECTIVE:   CHIEF COMPLAINT:  Chief Complaint  Patient presents with  . Follow-up    hospital fu, with uncle, pt reports fatigue    HPI:   Sarah Gilbert a 74 y.o.femalewith history of double kidney transplant (2011), HTN, . A fib (on xarelto), HLDwho presented on 01/07/2020 with L isded weakness, R gaze, L facial and slurred speech. Stroke work-up revealed several small right MCA infarcts w/ right ICA terminus thrombus s/p TICI 3 revascularization, infarcts embolic secondary to known AF off AC.  MRA head/neck unremarkable post IR.  Previously on Xarelto but per report, missed 3 to 4 days due to running out of prescription and transition to Eliquis 5 mg twice daily for secondary stroke prevention and history of atrial fibrillation.  History of HTN and resumed home amlodipine, Lasix and metoprolol.  History of HLD with LDL 74 and increased home dose atorvastatin from 20 mg to 40 mg daily.  Other stroke risk factors include advanced age and obesity but no prior stroke history.  Other active problems include ESRD s/p bilateral kidney transplant on Prograf and CellCept (not on HD), thyroid disease and hypokalemia.  Evaluated by therapies and discharged home with home health PT/OT.  Stroke:Severeal small R MCAinfarctsembolic secondary to known AF off AC  Code Stroke CT headhyperdense R supraclinoid ICA.ASPECTS 10.  Cerebral angioR ICA terminus thrombus s/p TICI3 revascularization w/ 2 passes([place])  Post IR CTno hemorrhage, no statin  MRISeveral small scattered R hemisphere infarcts  MRA headmild intracranial atherosclerosis   MRA neckUnremarkable  2D EchoEF60-65%. No source of embolus. LA  dilated  LDL74 -increase atorvastatin from 20 mg to 40 mg daily  HgbA1c6.1  Xarelto (rivaroxaban) dailyprior to admission but had missed 3-4 days, now onEliquis 5 mg bid.   Therapy recommendations:HH PT, [place] home  Today, 02/13/2020, Sarah Gilbert is being seen for hospital follow-up accompanied by her uncle.  She has been doing well since discharge without residual stroke deficits.  Participated in [facility] PT/OT which has since been completed.  She does report increased fatigue since her stroke.  Sleeps well at night.  Sleeps alone but denies snoring or witnessed apneas.  She has not previously underwent sleep study to rule out sleep apnea.  Continues on Eliquis 5 mg twice daily without bleeding or bruising.  Continues to follow with cardiologist Dr. Terrence Dupont routinely.  Continues on atorvastatin 40 mg daily without myalgias.  Blood pressure today 143/80.  No concerns at this time.     ROS:   14 system review of systems performed and negative with exception of fatigue  PMH:  Past Medical History:  Diagnosis Date  . Anemia   . Arthritis   . Atrial fibrillation ([place])   . Bleeding nose    history of   . ESRD (end stage renal disease) ([place])   . History of bronchitis   . Hyperlipemia   . Hypertension   . Joint inflammation   . Obesity   . PONV (postoperative nausea and vomiting)   . Rotator cuff tear arthropathy of right shoulder 06/18/2015  . Thyroid disease   . Wears glasses     PSH:  Past Surgical History:  Procedure Laterality Date  .  ABDOMINAL HYSTERECTOMY    . APPENDECTOMY    . CARDIAC CATHETERIZATION N/A 04/18/2015   Procedure: Left Heart Cath and Coronary Angiography;  Surgeon: Charolette Forward, MD;  Location: [facility];  Service: Cardiovascular;  Laterality: N/A;  . CHOLECYSTECTOMY    . COLONOSCOPY    . IR CT HEAD LTD  01/07/2020  . IR PERCUTANEOUS ART THROMBECTOMY/INFUSION INTRACRANIAL INC DIAG ANGIO  01/07/2020  . IR US GUIDE  VASC ACCESS RIGHT  01/07/2020  . KIDNEY TRANSPLANT     june 2011-[facility]  . RADIOLOGY WITH ANESTHESIA N/A 01/07/2020   Procedure: IR WITH ANESTHESIA;  Surgeon: Radiologist, Medication, MD;  Location: [facility];  Service: Radiology;  Laterality: N/A;  . TONSILLECTOMY    . TOTAL SHOULDER ARTHROPLASTY Right 06/18/2015   Procedure: RIGHT TOTAL SHOULDER ARTHROPLASTY;  Surgeon: Marchia Bond, MD;  Location: [facility];  Service: Orthopedics;  Laterality: Right;    Social History:  Social History   Socioeconomic History  . Marital status: Single    Spouse name: Not on file  . Number of children: Not on file  . Years of education: Not on file  . Highest education level: Not on file  Occupational History  . Occupation: retired  Tobacco Use  . Smoking status: Never Smoker  . Smokeless tobacco: Never Used  Vaping Use  . Vaping Use: Never used  Substance and Sexual Activity  . Alcohol use: No  . Drug use: No  . Sexual activity: Not Currently  Other Topics Concern  . Not on file  Social History Narrative  . Not on file   Social Determinants of Health   Financial Resource Strain: Low Risk   . Difficulty of Paying Living Expenses: Not hard at all  Food Insecurity: No Food Insecurity  . Worried About Charity fundraiser in the Last Year: Never true  . Ran Out of Food in the Last Year: Never true  Transportation Needs: No Transportation Needs  . Lack of Transportation (Medical): No  . Lack of Transportation (Non-Medical): No  Physical Activity: Inactive  . Days of Exercise per Week: 0 days  . Minutes of Exercise per Session: 0 min  Stress: No Stress Concern Present  . Feeling of Stress : Not at all  Social Connections:   . Frequency of Communication with Friends and Family:   . Frequency of Social Gatherings with Friends and Family:   . Attends Religious Services:   . Active Member of Clubs or Organizations:   . Attends Archivist Meetings:   Marland Kitchen Marital Status:   Intimate Partner  Violence:   . Fear of Current or Ex-Partner:   . Emotionally Abused:   Marland Kitchen Physically Abused:   . Sexually Abused:     Family History:  Family History  Problem Relation Age of Onset  . Dementia Mother   . Cancer Father     Medications:   Current Outpatient Medications on File Prior to Visit  Medication Sig Dispense Refill  . amLODipine (NORVASC) 10 MG tablet Take 5 mg by mouth daily.     Marland Kitchen apixaban (ELIQUIS) 5 MG TABS tablet Take 1 tablet (5 mg total) by mouth 2 (two) times daily. 60 tablet 2  . atorvastatin (LIPITOR) 40 MG tablet Take 1 tablet (40 mg total) by mouth daily. 30 tablet 2  . b complex-vitamin c-folic acid (NEPHRO-VITE) 0.8 MG TABS Take 0.8 mg by mouth at bedtime.    . furosemide (LASIX) 40 MG tablet Take 40 mg by  mouth daily.     . magnesium gluconate (MAGONATE) 500 MG tablet Take 500 mg by mouth 2 (two) times daily.    . metoprolol (LOPRESSOR) 50 MG tablet Take 50 mg by mouth 2 (two) times daily.    . mycophenolate (CELLCEPT) 250 MG capsule Take 500 mg by mouth 2 (two) times daily.    . pantoprazole (PROTONIX) 40 MG tablet Take 40 mg by mouth daily.     . potassium chloride SA (K-DUR,KLOR-CON) 20 MEQ tablet Take 20 mEq by mouth daily.     . tacrolimus (PROGRAF) 0.5 MG capsule Take 0.5 mg by mouth 2 (two) times daily. Take one capsule along with the 1mg  capsule    . tacrolimus (PROGRAF) 1 MG capsule Take 2 mg by mouth 2 (two) times daily.      No current facility-administered medications on file prior to visit.    Allergies:   Allergies  Allergen Reactions  . Vancomycin Hives, Itching, Rash and Other (See Comments)    Blistering, swelling, and peeling of feet.  Pt denies any similar reactions around face or neck.  . Iohexol      Desc: iodine contrast/per dialysis center, Onset Date: [id number]       OBJECTIVE:  Physical Exam  Vitals:   02/13/20 0905  BP: (!) 143/80  Pulse: 86  Weight: 225 lb (102.1 kg)  Height: 5\' 5"  (1.651 m)   Body mass index is  37.44 kg/m. No exam data present  Depression screen [facility] 2/9 02/13/2020  Decreased Interest 0  Down, Depressed, Hopeless 0  PHQ - 2 Score 0  Altered sleeping -  Tired, decreased energy -  Change in appetite -  Feeling bad or failure about yourself  -  Trouble concentrating -  Moving slowly or fidgety/restless -  Suicidal thoughts -  PHQ-9 Score -     General: well developed, well nourished,  pleasant elderly African-American female, seated, in no evident distress Head: head normocephalic and atraumatic.   Neck: supple with no carotid or supraclavicular bruits Cardiovascular: irregular rate and rhythm, no murmurs Musculoskeletal: no deformity Skin:  no rash/petichiae Vascular:  Normal pulses all extremities   Neurologic Exam Mental Status: Awake and fully alert. Oriented to place and time. Recent and remote memory intact. Attention span, concentration and fund of knowledge appropriate. Mood and affect appropriate.  Cranial Nerves: Fundoscopic exam reveals sharp disc margins. Pupils equal, briskly reactive to light. Extraocular movements full without nystagmus. Visual fields full to confrontation. Hearing intact. Facial sensation intact. Face, tongue, palate moves normally and symmetrically.  Motor: Normal bulk and tone. Normal strength in all tested extremity muscles. Sensory.: intact to touch , pinprick , position and vibratory sensation.  Coordination: Rapid alternating movements normal in all extremities except slightly decreased left hand. Finger-to-nose and heel-to-shin performed accurately bilaterally. Mild action tremor right hand (chronic) Gait and Station: Arises from chair without difficulty. Stance is normal. Gait demonstrates normal stride length and balance Reflexes: 1+ and symmetric. Toes downgoing.     NIHSS  0 Modified Rankin  1 CHA2DS2-VASc 5 HAS-BLED 2     ASSESSMENT: Sarah Gilbert is a 74 y.o. year old female presented with left-sided weakness, right  gaze preference, left facial droop and slurred speech on 01/07/2020 with stroke work-up revealing several small right MCA infarcts w/ right ICA terminus thrombus s/p TICI 3 revascularization, infarct embolic secondary to known AF off AC (missed 3-4 doses of Xarelto).  Transition to Eliquis for secondary stroke prevention.  Vascular risk  factors include HTN, HLD, A. fib and obesity.      PLAN:  1. Right MCA infarcts:  -Residual deficits: Mild decrease left hand dexterity -Continue Eliquis (apixaban) daily  and atorvastatin 40 mg daily for secondary stroke prevention. -Ongoing follow-up with PCP for management of stroke risk factors for secondary stroke prevention 2. Atrial fibrillation: -Continuation of Eliquis 5 mg twice daily -Continue to follow with cardiology for prescribing, monitoring and management 3. Risk for sleep apnea -High risk for sleep apnea with history of atrial fibrillation and recent stroke -Reports daytime fatigue prior to stroke and slightly worsened after stroke -Referral placed to [facility] for evaluation for possible sleep apnea -Educated on increased risk for cardiovascular disease and recurrent stroke with untreated sleep apnea 4. HTN:  -BP goal<130/90 -Stable on amlodipine, furosemide and metoprolol -Continue to follow with PCP for management of above medications and routine monitoring 5. HLD:  -LDL goal<70 -Continue atorvastatin 40 mg daily -Continue to follow with PCP for prescribing, monitoring and management    Follow up in 4 months or call earlier if needed   I spent 45 minutes of face-to-face and non-face-to-face time with patient and uncle.  This included previsit chart review, lab review, study review, order entry, electronic health record documentation, patient education regarding recent stroke, residual deficits, risk for sleep apnea, importance of managing stroke risk factors and answered all questions to patient satisfaction   Frann Rider, AGNP-BC  [facility] [address] [place], [place] [address]  Phone [phone] Fax [phone] Note: This document was prepared with digital dictation and possible smart phrase technology. Any transcriptional errors that result from this process are unintentional.

## 2020-02-13 NOTE — Progress Notes (Signed)
I agree with the above plan 

## 2020-02-13 NOTE — Patient Instructions (Signed)
Referral placed to [place] sleep [facility] for further evaluation of sleep apnea - you will be called to schedule visit  Continue Eliquis (apixaban) daily  and atorvastatin 40 mg daily for secondary stroke prevention  Continue to follow up with PCP regarding cholesterol and blood pressure management   Continue to follow with cardiology for atrial fibrillation and Eliquis management  Continue to monitor blood pressure at home  Maintain strict control of hypertension with blood pressure goal below 130/90, diabetes with hemoglobin A1c goal below 6.5% and cholesterol with LDL cholesterol (bad cholesterol) goal below 70 mg/dL. I also advised the patient to eat a healthy diet with plenty of whole grains, cereals, fruits and vegetables, exercise regularly and maintain ideal body weight.  Followup in the future with me in 4 months or call earlier if needed       Thank you for coming to see [name] at [facility]. I hope we have been able to provide you high quality care today.  You may receive a patient satisfaction survey over the next few weeks. We would appreciate your feedback and comments so that we may continue to improve ourselves and the health of our patients.

## 2020-03-14 ENCOUNTER — Other Ambulatory Visit: Payer: Self-pay

## 2020-03-14 ENCOUNTER — Encounter: Payer: Self-pay | Admitting: Neurology

## 2020-03-14 ENCOUNTER — Ambulatory Visit: Payer: Medicare Other | Admitting: Neurology

## 2020-03-14 VITALS — BP 141/82 | HR 77 | Ht 65.0 in | Wt 217.0 lb

## 2020-03-14 DIAGNOSIS — I482 Chronic atrial fibrillation, unspecified: Secondary | ICD-10-CM | POA: Diagnosis not present

## 2020-03-14 DIAGNOSIS — E669 Obesity, unspecified: Secondary | ICD-10-CM

## 2020-03-14 DIAGNOSIS — Z8673 Personal history of transient ischemic attack (TIA), and cerebral infarction without residual deficits: Secondary | ICD-10-CM | POA: Diagnosis not present

## 2020-03-14 NOTE — Patient Instructions (Signed)
  Here is what we discussed today and what we came up with as our plan for you:    Based on your symptoms and your exam I believe you are at risk for obstructive sleep apnea (aka OSA), and I think we should proceed with a sleep study to determine whether you do or do not have OSA and how severe it is. Even, if you have mild OSA, I may want you to consider treatment with CPAP, as treatment of even borderline or mild sleep apnea can result and improvement of symptoms such as sleep disruption, daytime sleepiness, nighttime bathroom breaks, restless leg symptoms, improvement of headache syndromes, even improved mood disorder.   As explained, an attended sleep study meaning you get to stay overnight in the sleep lab, lets us monitor sleep-related behaviors such as sleep talking and leg movements in sleep, in addition to monitoring for sleep apnea.  A home sleep test is a screening tool for sleep apnea only, and unfortunately does not help with any other sleep-related diagnoses.  Please remember, the long-term risks and ramifications of untreated moderate to severe obstructive sleep apnea are: increased Cardiovascular disease, including congestive heart failure, stroke, difficult to control hypertension, treatment resistant obesity, arrhythmias, especially irregular heartbeat commonly known as A. Fib. (atrial fibrillation); even type 2 diabetes has been linked to untreated OSA.   Sleep apnea can cause disruption of sleep and sleep deprivation in most cases, which, in turn, can cause recurrent headaches, problems with memory, mood, concentration, focus, and vigilance. Most people with untreated sleep apnea report excessive daytime sleepiness, which can affect their ability to drive. Please do not drive if you feel sleepy. Patients with sleep apnea can also develop difficulty initiating and maintaining sleep (aka insomnia).   Having sleep apnea may increase your risk for other sleep disorders, including  involuntary behaviors sleep such as sleep terrors, sleep talking, sleepwalking.    Having sleep apnea can also increase your risk for restless leg syndrome and leg movements at night.   Please note that untreated obstructive sleep apnea may carry additional perioperative morbidity. Patients with significant obstructive sleep apnea (typically, in the moderate to severe degree) should receive, if possible, perioperative PAP (positive airway pressure) therapy and the surgeons and particularly the anesthesiologists should be informed of the diagnosis and the severity of the sleep disordered breathing.   I will likely see you back after your sleep study to go over the test results and where to go from there. We will call you after your sleep study to advise about the results (most likely, you will hear from Megan, my nurse) and to set up an appointment at the time, as necessary.    Our sleep lab administrative assistant will call you to schedule your sleep study and give you further instructions, regarding the check in process for the sleep study, arrival time, what to bring, when you can expect to leave after the study, etc., and to answer any other logistical questions you may have. If you don't hear back from her by about 2 weeks from now, please feel free to call her direct line at [phone] or you can call our general clinic number, or email us through My Chart.    

## 2020-03-14 NOTE — Progress Notes (Signed)
Subjective:    Patient ID: Sarah Gilbert is a 74 y.o. female.  HPI     Star Age, MD, PhD [facility] [address], [address] [place], [place] [address]  Dear Janett Billow,   I saw your patient, Sarah Gilbert, upon your kind request, in my Sleep clinic today for initial consultation of her sleep disorder, in particular, concern for underlying obstructive sleep apnea.  The patient is accompanied by her maternal uncle today.  As you know, Sarah Gilbert is a 74 year old right-handed woman with an underlying complex medical history of embolic stroke in May 1950, chronic A. fib, end-stage renal disease with status post kidney transplant in 2011, arthritis, hypertension, hyperlipidemia, anemia, thyroid disease and obesity, who reports some daytime tiredness but no frank sleepiness.  She tries to go to bed early and is a early riser.  Her Epworth sleepiness score is 3 out of 24.  He is not sure if she snores.  She is single and has no children.  They have no pets at the house.  Bedtime is generally around 730 and rise time around 5:30 AM.  She is a non-smoker and does not utilize alcohol and drinks very little caffeine, not necessarily daily.  She has no known family history of sleep apnea.  She had a tonsillectomy as a child.  Her Past Medical History Is Significant For: Past Medical History:  Diagnosis Date  . Anemia   . Arthritis   . Atrial fibrillation ([place])   . Bleeding nose    history of   . ESRD (end stage renal disease) ([place])   . History of bronchitis   . Hyperlipemia   . Hypertension   . Joint inflammation   . Obesity   . PONV (postoperative nausea and vomiting)   . Rotator cuff tear arthropathy of right shoulder 06/18/2015  . Thyroid disease   . Wears glasses     Her Past Surgical History Is Significant For: Past Surgical History:  Procedure Laterality Date  . ABDOMINAL HYSTERECTOMY    . APPENDECTOMY    . CARDIAC CATHETERIZATION N/A  04/18/2015   Procedure: Left Heart Cath and Coronary Angiography;  Surgeon: Charolette Forward, MD;  Location: [facility];  Service: Cardiovascular;  Laterality: N/A;  . CHOLECYSTECTOMY    . COLONOSCOPY    . IR CT HEAD LTD  01/07/2020  . IR PERCUTANEOUS ART THROMBECTOMY/INFUSION INTRACRANIAL INC DIAG ANGIO  01/07/2020  . IR US GUIDE VASC ACCESS RIGHT  01/07/2020  . KIDNEY TRANSPLANT     june 2011-[facility]  . RADIOLOGY WITH ANESTHESIA N/A 01/07/2020   Procedure: IR WITH ANESTHESIA;  Surgeon: Radiologist, Medication, MD;  Location: [facility];  Service: Radiology;  Laterality: N/A;  . TONSILLECTOMY    . TOTAL SHOULDER ARTHROPLASTY Right 06/18/2015   Procedure: RIGHT TOTAL SHOULDER ARTHROPLASTY;  Surgeon: Marchia Bond, MD;  Location: [facility];  Service: Orthopedics;  Laterality: Right;    Her Family History Is Significant For: Family History  Problem Relation Age of Onset  . Dementia Mother   . Cancer Father     Her Social History Is Significant For: Social History   Socioeconomic History  . Marital status: Single    Spouse name: Not on file  . Number of children: Not on file  . Years of education: Not on file  . Highest education level: Not on file  Occupational History  . Occupation: retired  Tobacco Use  . Smoking status: Never Smoker  . Smokeless  tobacco: Never Used  Vaping Use  . Vaping Use: Never used  Substance and Sexual Activity  . Alcohol use: No  . Drug use: No  . Sexual activity: Not Currently  Other Topics Concern  . Not on file  Social History Narrative  . Not on file   Social Determinants of Health   Financial Resource Strain: Low Risk   . Difficulty of Paying Living Expenses: Not hard at all  Food Insecurity: No Food Insecurity  . Worried About Charity fundraiser in the Last Year: Never true  . Ran Out of Food in the Last Year: Never true  Transportation Needs: No Transportation Needs  . Lack of Transportation (Medical): No  . Lack of Transportation  (Non-Medical): No  Physical Activity: Inactive  . Days of Exercise per Week: 0 days  . Minutes of Exercise per Session: 0 min  Stress: No Stress Concern Present  . Feeling of Stress : Not at all  Social Connections:   . Frequency of Communication with Friends and Family:   . Frequency of Social Gatherings with Friends and Family:   . Attends Religious Services:   . Active Member of Clubs or Organizations:   . Attends Archivist Meetings:   Marland Kitchen Marital Status:     Her Allergies Are:  Allergies  Allergen Reactions  . Vancomycin Hives, Itching, Rash and Other (See Comments)    Blistering, swelling, and peeling of feet.  Pt denies any similar reactions around face or neck.  . Iohexol      Desc: iodine contrast/per dialysis center, Onset Date: [id number]   :   Her Current Medications Are:  Outpatient Encounter Medications as of 03/14/2020  Medication Sig  . amLODipine (NORVASC) 10 MG tablet Take 5 mg by mouth daily.   Marland Kitchen apixaban (ELIQUIS) 5 MG TABS tablet Take 1 tablet (5 mg total) by mouth 2 (two) times daily.  Marland Kitchen atorvastatin (LIPITOR) 40 MG tablet Take 1 tablet (40 mg total) by mouth daily.  Marland Kitchen b complex-vitamin c-folic acid (NEPHRO-VITE) 0.8 MG TABS Take 0.8 mg by mouth at bedtime.  . furosemide (LASIX) 40 MG tablet Take 40 mg by mouth daily.   . magnesium gluconate (MAGONATE) 500 MG tablet Take 500 mg by mouth 2 (two) times daily.  . metoprolol (LOPRESSOR) 50 MG tablet Take 50 mg by mouth 2 (two) times daily.  . mycophenolate (CELLCEPT) 250 MG capsule Take 500 mg by mouth 2 (two) times daily.  . pantoprazole (PROTONIX) 40 MG tablet Take 40 mg by mouth daily.   . potassium chloride SA (K-DUR,KLOR-CON) 20 MEQ tablet Take 20 mEq by mouth daily.   . tacrolimus (PROGRAF) 0.5 MG capsule Take 0.5 mg by mouth 2 (two) times daily. Take one capsule along with the 1mg  capsule  . tacrolimus (PROGRAF) 1 MG capsule Take 2 mg by mouth 2 (two) times daily.    No facility-administered  encounter medications on file as of 03/14/2020.  :   Review of Systems:  Out of a complete 14 point review of systems, all are reviewed and negative with the exception of these symptoms as listed below:  Review of Systems  Neurological:       Referred by Frann Rider to rule out OSA. Denies ever having a sleep study before. Also denies being told that she snores during the night or being fatigued during the day.   Denies family history of sleep apnea or sleep disorders.   Epworth Sleepiness Scale 0= would never  doze 1= slight chance of dozing 2= moderate chance of dozing 3= high chance of dozing  Sitting and reading: 0 Watching TV: 1 Sitting inactive in a public place (ex. Theater or meeting): 0 As a passenger in a car for an hour without a break: 0 Lying down to rest in the afternoon: 1 Sitting and talking to someone: 0 Sitting quietly after lunch (no alcohol): 1 In a car, while stopped in traffic: 0 Total:3     Objective:  Neurological Exam  Physical Exam Physical Examination:   Vitals:   03/14/20 0749  BP: (!) 141/82  Pulse: 77    General Examination: The patient is a very pleasant 74 y.o. female in no acute distress. She appears well-developed and well-nourished and well groomed.   HEENT: Normocephalic, atraumatic, pupils are equal, round and reactive to light, extraocular tracking is good without limitation to gaze excursion or nystagmus noted. Hearing is grossly intact. Face is symmetric with normal facial animation. Speech is clear with no dysarthria noted. There is no hypophonia. There is no lip, neck/head, jaw or voice tremor. Neck is supple with full range of passive and active motion. There are no carotid bruits on auscultation. Oropharynx exam reveals: mild mouth dryness, adequate dental hygiene and mild airway crowding, due to small airway entry, uvula is small, Mallampati class I, tonsils absent.  Tongue protrudes centrally and palate elevates symmetrically.   She has a mild overbite.  Chest: Clear to auscultation without wheezing, rhonchi or crackles noted.  Heart: S1+S2+0, irregularly irregular.  No murmurs noted.     Abdomen: Soft, non-tender and non-distended with normal bowel sounds appreciated on auscultation.  Extremities: There is trace to 1+ pitting edema right distal leg.  She is wearing an ankle brace on the right.  She has trace edema in the left leg.  She has AV fistulas in both forearms from her hemodialysis in the past.  Skin: Warm and dry without trophic changes noted.   Musculoskeletal: exam reveals right knee discomfort and mild swelling of the right knee.   Neurologically:  Mental status: The patient is awake, alert and oriented in all 4 spheres. Her immediate and remote memory, attention, language skills and fund of knowledge are appropriate. There is no evidence of aphasia, agnosia, apraxia or anomia. Speech is clear with normal prosody and enunciation. Thought process is linear. Mood is normal and affect is normal.  Cranial nerves II - XII are as described above under HEENT exam.  Motor exam: Normal bulk, strength and tone is noted globally but she does have right leg weakness in the 4 out of 5 range.  Romberg is not tested for safety concerns.   Cerebellar testing: No dysmetria or intention tremor. There is no truncal or gait ataxia.  Sensory exam: intact to light touch in the upper and lower extremities.  Gait, station and balance: She stands with mild difficulty.  Right knee discomfort is noted as well as genu valgus deformity on the right knee.  She walks with a single-point cane.   Assessment and Plan:   In summary, Emmani Lesueur is a very pleasant 74 y.o.-year old female with an underlying complex medical history of embolic stroke in May 1829, chronic A. fib, end-stage renal disease with status post kidney transplant in 2011, arthritis, hypertension, hyperlipidemia, anemia, thyroid disease and obesity, whose  history and physical exam are concerning for obstructive sleep apnea (OSA). I had a long chat with the patient and her uncle about my findings and  the diagnosis of OSA, its prognosis and treatment options. We talked about medical treatments, surgical interventions and non-pharmacological approaches. I explained in particular the risks and ramifications of untreated moderate to severe OSA, especially with respect to developing cardiovascular disease down the Road, including congestive heart failure, difficult to treat hypertension, cardiac arrhythmias, or stroke. Even type 2 diabetes has, in part, been linked to untreated OSA. Symptoms of untreated OSA include daytime sleepiness, memory problems, mood irritability and mood disorder such as depression and anxiety, lack of energy, as well as recurrent headaches, especially morning headaches. We talked about trying to maintain a healthy lifestyle in general, as well as the importance of weight control. We also talked about the importance of good sleep hygiene. I recommended the following at this time: sleep study.  I explained the sleep test procedure to the patient and also outlined possible surgical and non-surgical treatment options of OSA, including the use of PAP treatment. She indicated that she would be willing to try CPAP if the need arises. I explained the importance of being compliant with PAP treatment, not only for insurance purposes but primarily to improve Her symptoms, and for the patient's long term health benefit, including to reduce Her cardiovascular risks. I answered all their questions today and the patient and her uncle were in agreement. I plan to see her back after the sleep study is completed and encouraged her to call with any interim questions, concerns, problems or updates.   Thank you very much for allowing me to participate in the care of this nice patient. If I can be of any further assistance to you please do not hesitate to talk to  me.  Sincerely,   Star Age, MD, PhD

## 2020-03-31 ENCOUNTER — Ambulatory Visit (INDEPENDENT_AMBULATORY_CARE_PROVIDER_SITE_OTHER): Payer: Medicare Other | Admitting: Neurology

## 2020-03-31 DIAGNOSIS — Z8673 Personal history of transient ischemic attack (TIA), and cerebral infarction without residual deficits: Secondary | ICD-10-CM

## 2020-03-31 DIAGNOSIS — G4733 Obstructive sleep apnea (adult) (pediatric): Secondary | ICD-10-CM | POA: Diagnosis not present

## 2020-03-31 DIAGNOSIS — I482 Chronic atrial fibrillation, unspecified: Secondary | ICD-10-CM

## 2020-03-31 DIAGNOSIS — G4720 Circadian rhythm sleep disorder, unspecified type: Secondary | ICD-10-CM

## 2020-03-31 DIAGNOSIS — E669 Obesity, unspecified: Secondary | ICD-10-CM

## 2020-03-31 DIAGNOSIS — R9431 Abnormal electrocardiogram [ECG] [EKG]: Secondary | ICD-10-CM

## 2020-04-03 ENCOUNTER — Encounter: Payer: Self-pay | Admitting: Nurse Practitioner

## 2020-04-03 ENCOUNTER — Ambulatory Visit (INDEPENDENT_AMBULATORY_CARE_PROVIDER_SITE_OTHER): Payer: Medicare Other | Admitting: Nurse Practitioner

## 2020-04-03 ENCOUNTER — Other Ambulatory Visit: Payer: Self-pay

## 2020-04-03 VITALS — BP 132/84 | HR 72 | Temp 98.4°F | Ht 63.4 in | Wt 211.8 lb

## 2020-04-03 DIAGNOSIS — I482 Chronic atrial fibrillation, unspecified: Secondary | ICD-10-CM | POA: Diagnosis not present

## 2020-04-03 DIAGNOSIS — I10 Essential (primary) hypertension: Secondary | ICD-10-CM | POA: Diagnosis not present

## 2020-04-03 DIAGNOSIS — R7309 Other abnormal glucose: Secondary | ICD-10-CM

## 2020-04-03 DIAGNOSIS — R413 Other amnesia: Secondary | ICD-10-CM | POA: Diagnosis not present

## 2020-04-03 NOTE — Progress Notes (Signed)
This visit occurred during the SARS-CoV-2 public health emergency.  Safety protocols were in place, including screening questions prior to the visit, additional usage of staff PPE, and extensive cleaning of exam room while observing appropriate contact time as indicated for disinfecting solutions.  Subjective:     Patient ID: Sarah Gilbert , female    DOB: 25-Apr-1946 , 74 y.o.   MRN: [id number]   Chief Complaint  Patient presents with  . Hypertension  . abnormal gulcose f/u  . sleep study f/u    patient stated she had a sleep study on sunday and would like the results    HPI  Here for 3 month diabetes and hypertension follow up.   She had a sleep study done with Dr. Rexene Alberts on Sunday.  She feels everything is going well. She is having a problem with her memory.  She feels that sometimes she is forgetful.    She received a call from her Nephrologist to have the 3rd Booster vaccine   She is going to [place] [facility] to recheck her urine today  Hypertension This is a chronic problem. The current episode started more than 1 year ago. The problem is controlled. Pertinent negatives include no anxiety, chest pain, headaches, palpitations or shortness of breath. There are no associated agents to hypertension. There are no known risk factors for coronary artery disease. Past treatments include calcium channel blockers and diuretics. The current treatment provides significant improvement. There are no compliance problems.  There is no history of angina. There is no history of chronic renal disease.     Past Medical History:  Diagnosis Date  . Anemia   . Arthritis   . Atrial fibrillation ([place])   . Bleeding nose    history of   . ESRD (end stage renal disease) ([place])   . History of bronchitis   . Hyperlipemia   . Hypertension   . Joint inflammation   . Obesity   . PONV (postoperative nausea and vomiting)   . Rotator cuff tear arthropathy of right shoulder 06/18/2015  . Thyroid  disease   . Wears glasses      Family History  Problem Relation Age of Onset  . Dementia Mother   . Cancer Father      Current Outpatient Medications:  .  amLODipine (NORVASC) 10 MG tablet, Take 5 mg by mouth daily. , Disp: , Rfl:  .  apixaban (ELIQUIS) 5 MG TABS tablet, Take 1 tablet (5 mg total) by mouth 2 (two) times daily., Disp: 60 tablet, Rfl: 2 .  atorvastatin (LIPITOR) 40 MG tablet, Take 1 tablet (40 mg total) by mouth daily., Disp: 30 tablet, Rfl: 2 .  b complex-vitamin c-folic acid (NEPHRO-VITE) 0.8 MG TABS, Take 0.8 mg by mouth at bedtime., Disp: , Rfl:  .  furosemide (LASIX) 40 MG tablet, Take 40 mg by mouth daily. , Disp: , Rfl:  .  magnesium gluconate (MAGONATE) 500 MG tablet, Take 500 mg by mouth 2 (two) times daily., Disp: , Rfl:  .  metoprolol (LOPRESSOR) 50 MG tablet, Take 50 mg by mouth 2 (two) times daily., Disp: , Rfl:  .  mycophenolate (CELLCEPT) 250 MG capsule, Take 500 mg by mouth 2 (two) times daily., Disp: , Rfl:  .  pantoprazole (PROTONIX) 40 MG tablet, Take 40 mg by mouth daily. , Disp: , Rfl:  .  potassium chloride SA (K-DUR,KLOR-CON) 20 MEQ tablet, Take 20 mEq by mouth daily. , Disp: , Rfl:  .  tacrolimus (PROGRAF)  0.5 MG capsule, Take 0.5 mg by mouth 2 (two) times daily. Take one capsule along with the 76m capsule, Disp: , Rfl:  .  tacrolimus (PROGRAF) 1 MG capsule, Take 2 mg by mouth 2 (two) times daily. , Disp: , Rfl:    Allergies  Allergen Reactions  . Vancomycin Hives, Itching, Rash and Other (See Comments)    Blistering, swelling, and peeling of feet.  Pt denies any similar reactions around face or neck.  . Iohexol      Desc: iodine contrast/per dialysis center, Onset Date: [id number]     Review of Systems  Constitutional: Negative.  Negative for fatigue.  Eyes: Negative for visual disturbance.  Respiratory: Negative.  Negative for shortness of breath and wheezing.   Cardiovascular: Negative.  Negative for chest pain, palpitations and leg  swelling.  Gastrointestinal: Negative.   Endocrine: Negative.  Negative for polydipsia, polyphagia and polyuria.  Musculoskeletal: Negative.   Skin: Negative.   Neurological: Negative.  Negative for dizziness, weakness and headaches.  Psychiatric/Behavioral: Negative for confusion. The patient is not nervous/anxious.      Today's Vitals   04/03/20 0843  BP: 132/84  Pulse: 72  Temp: 98.4 F (36.9 C)  TempSrc: Oral  Weight: 211 lb 12.8 oz (96.1 kg)  Height: 5' 3.4" (1.61 m)  PainSc: 0-No pain   Body mass index is 37.05 kg/m.   Objective:  Physical Exam Constitutional:      General: She is not in acute distress.    Appearance: Normal appearance. She is obese.  Cardiovascular:     Rate and Rhythm: Normal rate and regular rhythm.     Pulses: Normal pulses.     Heart sounds: Normal heart sounds. No murmur heard.   Pulmonary:     Effort: Pulmonary effort is normal. No respiratory distress.     Breath sounds: Normal breath sounds. No wheezing.  Musculoskeletal:     Right lower leg: No edema.     Left lower leg: Edema (trace to 1+) present.  Skin:    General: Skin is warm and dry.  Neurological:     General: No focal deficit present.     Mental Status: She is alert and oriented to person, place, and time.     Cranial Nerves: No cranial nerve deficit.  Psychiatric:        Mood and Affect: Mood normal.        Behavior: Behavior normal.        Thought Content: Thought content normal.        Judgment: Judgment normal.         Assessment And Plan:     1. Hypertension, essential Chronic, blood pressure is fairly controlled. Continue with current medications and follow up with Cardiology (Dr. HTerrence Dupont  2. Abnormal glucose  Chronic, stable.  Continue with current medications  Encouraged to limit intake of sugary foods and drinks  Encouraged to increase physical activity to 150 minutes per week as tolerated - Hemoglobin A1c - CMP14+EGFR - Lipid panel  3. Memory  changes  Will check for metabolic causes  Memory 6CIT score was 4 in April today is 0 - VITAMIN D 25 Hydroxy (Vit-D Deficiency, Fractures) - Vitamin B12 - T pallidum Screening [place]  4. Chronic atrial fibrillation (HCC)  Stable, continue with Eliquis   Continue follow up with Dr. HTerrence Dupont   Patient was given opportunity to ask questions. Patient verbalized understanding of the plan and was able to repeat key elements of the  plan. All questions were answered to their satisfaction.  [name], FNP   I, [name], FNP, have reviewed all documentation for this visit. The documentation on 04/04/20 for the exam, diagnosis, procedures, and orders are all accurate and complete.  THE PATIENT IS ENCOURAGED TO PRACTICE SOCIAL DISTANCING DUE TO THE COVID-19 PANDEMIC.

## 2020-04-04 LAB — VITAMIN D 25 HYDROXY (VIT D DEFICIENCY, FRACTURES): Vit D, 25-Hydroxy: 11.1 ng/mL — ABNORMAL LOW (ref 30.0–100.0)

## 2020-04-04 LAB — LIPID PANEL
Chol/HDL Ratio: 2.3 ratio (ref 0.0–4.4)
Cholesterol, Total: 145 mg/dL (ref 100–199)
HDL: 63 mg/dL (ref >39)
LDL Chol Calc (NIH): 71 mg/dL (ref 0–99)
Triglycerides: 49 mg/dL (ref 0–149)
VLDL Cholesterol Cal: 11 mg/dL (ref 5–40)

## 2020-04-04 LAB — CMP14+EGFR
ALT: 12 IU/L (ref 0–32)
AST: 15 IU/L (ref 0–40)
Albumin/Globulin Ratio: 1.3 (ref 1.2–2.2)
Albumin: 4.3 g/dL (ref 3.7–4.7)
Alkaline Phosphatase: 127 IU/L — ABNORMAL HIGH (ref 48–121)
BUN/Creatinine Ratio: 11 — ABNORMAL LOW (ref 12–28)
BUN: 11 mg/dL (ref 8–27)
Bilirubin Total: 0.8 mg/dL (ref 0.0–1.2)
CO2: 25 mmol/L (ref 20–29)
Calcium: 10.3 mg/dL (ref 8.7–10.3)
Chloride: 103 mmol/L (ref 96–106)
Creatinine, Ser: 1.03 mg/dL — ABNORMAL HIGH (ref 0.57–1.00)
GFR calc Af Amer: 63 mL/min/{1.73_m2} (ref >59)
GFR calc non Af Amer: 55 mL/min/{1.73_m2} — ABNORMAL LOW (ref >59)
Globulin, Total: 3.2 g/dL (ref 1.5–4.5)
Glucose: 104 mg/dL — ABNORMAL HIGH (ref 65–99)
Potassium: 4.1 mmol/L (ref 3.5–5.2)
Sodium: 143 mmol/L (ref 134–144)
Total Protein: 7.5 g/dL (ref 6.0–8.5)

## 2020-04-04 LAB — T PALLIDUM SCREENING CASCADE: T pallidum Antibodies (TP-PA): NONREACTIVE

## 2020-04-04 LAB — HEMOGLOBIN A1C
Est. average glucose Bld gHb Est-mCnc: 123 mg/dL
Hgb A1c MFr Bld: 5.9 % — ABNORMAL HIGH (ref 4.8–5.6)

## 2020-04-04 LAB — VITAMIN B12: Vitamin B-12: 545 pg/mL (ref 232–1245)

## 2020-04-09 ENCOUNTER — Other Ambulatory Visit: Payer: Self-pay | Admitting: Nurse Practitioner

## 2020-04-09 DIAGNOSIS — E559 Vitamin D deficiency, unspecified: Secondary | ICD-10-CM

## 2020-04-09 MED ORDER — VITAMIN D (ERGOCALCIFEROL) 1.25 MG (50000 UNIT) PO CAPS: 50000.0000 [IU] | ORAL_CAPSULE | ORAL | 1 refills | Status: DC

## 2020-04-10 ENCOUNTER — Other Ambulatory Visit: Payer: Self-pay

## 2020-04-10 DIAGNOSIS — E559 Vitamin D deficiency, unspecified: Secondary | ICD-10-CM

## 2020-04-10 MED ORDER — VITAMIN D (ERGOCALCIFEROL) 1.25 MG (50000 UNIT) PO CAPS: 50000.0000 [IU] | ORAL_CAPSULE | ORAL | 2 refills | Status: DC

## 2020-04-11 NOTE — Procedures (Signed)
PATIENT'S NAME:  Sarah, Gilbert DOB:      November 28, 1945      MR#:    [id number]     DATE OF RECORDING: 03/31/2020 REFERRING M.D.:  [name], FNP Study Performed:   Baseline Polysomnogram HISTORY: 74 year old woman with a history of embolic stroke in May 6269, chronic A. fib, end-stage renal disease with status post kidney transplant in 2011, arthritis, hypertension, hyperlipidemia, anemia, thyroid disease and obesity, who reports some daytime tiredness. The patient endorsed the Epworth Sleepiness Scale at 3 points. The patient's weight 216 pounds with a height of 65 (inches), resulting in a BMI of 36. kg/m2. The patient's neck circumference measured 13.5 inches.  CURRENT MEDICATIONS: Norvasc, Eliquis, Lipitor, Lasix, Lopressor, Cellcept, Protonix, Prograf   PROCEDURE:  This is a multichannel digital polysomnogram utilizing the Somnostar 11.2 system.  Electrodes and sensors were applied and monitored per AASM Specifications.   EEG, EOG, Chin and Limb EMG, were sampled at 200 Hz.  ECG, Snore and Nasal Pressure, Thermal Airflow, Respiratory Effort, CPAP Flow and Pressure, Oximetry was sampled at 50 Hz. Digital video and audio were recorded.      BASELINE STUDY  Lights Out was at 20:59 and Lights On at 04:39.  Total recording time (TRT) was 460.5 minutes, with a total sleep time (TST) of 254 minutes.   The patient's sleep latency was 83 minutes, which is delayed. REM latency was 123 minutes, which is delayed. The sleep efficiency was 55.2 %, which is reduced.     SLEEP ARCHITECTURE: WASO (Wake after sleep onset) was 115.5 minutes with moderate to severe sleep fragmentation noted. There were 51.5 minutes in Stage N1, 74.5 minutes Stage N2, 80 minutes Stage N3 and 48 minutes in Stage REM.  The percentage of Stage N1 was 20.3%, which is increased, Stage N2 was 29.3%, which is reduced, Stage N3 was 31.5%, which is increased, and Stage R (REM sleep) was 18.9%, which is near-normal. The arousals were  noted as: 75 were spontaneous, 0 were associated with PLMs, 10 were associated with respiratory events.  RESPIRATORY ANALYSIS:  There were a total of 26 respiratory events:  0 obstructive apneas, 0 central apneas and 0 mixed apneas with a total of 0 apneas and an apnea index (AI) of 0 /hour. There were 26 hypopneas with a hypopnea index of 6.1 /hour. The patient also had 0 respiratory event related arousals (RERAs).      The total APNEA/HYPOPNEA INDEX (AHI) was 6.1/hour and the total RESPIRATORY DISTURBANCE INDEX was  6.1 /hour.  10 events occurred in REM sleep and 32 events in NREM. The REM AHI was  12.5 /hour, versus a non-REM AHI of 4.7. The patient spent 254 minutes of total sleep time in the supine position and 0 minutes in non-supine.. The supine AHI was 6.1 versus a non-supine AHI of 0.0.  OXYGEN SATURATION & C02:  The Wake baseline 02 saturation was 98%, with the lowest being 87%. Time spent below 89% saturation equaled 1 minutes.   PERIODIC LIMB MOVEMENTS: The patient had a total of 0 Periodic Limb Movements.  The Periodic Limb Movement (PLM) index was 0 and the PLM Arousal index was 0/hour.  Audio and video analysis did not show any abnormal or unusual movements, behaviors, phonations or vocalizations. She slept elevated with 3 pillows. The patient took 1 bathroom break. Mild snoring was noted. The EKG was in keeping A. Fib and PVCs noted.   Post-study, the patient indicated that sleep was the same as usual.  IMPRESSION: 1. Obstructive Sleep Apnea (OSA) 2. Dysfunctions associated with sleep stages or arousal from sleep 3. Non-specific abnormal EKG  RECOMMENDATIONS: 1. This study demonstrates was limited due to reduced sleep efficiency and poor sleep consolidation.  There is evidence of overall mild obstructive sleep apnea with an AHI of 6.1/h, REM AHI of 12.5, O2 nadir of 87%.  Given the patient's medical history and sleep related complaints, treatment with positive airway pressure is  recommended; this can be achieved in the form of autoPAP. Alternatively, a full-night CPAP titration study would allow optimization of therapy, if needed, down the road. Other treatment options may include avoidance of supine sleep position along with weight loss, upper airway or jaw surgery in selected patients or the use of an oral appliance in certain patients. ENT evaluation and/or consultation with a maxillofacial surgeon or dentist may be feasible in some instances.    2. Please note that untreated obstructive sleep apnea may carry additional perioperative morbidity. Patients with significant obstructive sleep apnea should receive perioperative PAP therapy and the surgeons and particularly the anesthesiologist should be informed of the diagnosis and the severity of the sleep disordered breathing. 3. This study shows sleep fragmentation and abnormal sleep stage percentages; these are nonspecific findings and per se do not signify an intrinsic sleep disorder or a cause for the patient's sleep-related symptoms. Causes include (but are not limited to) the first night effect of the sleep study, circadian rhythm disturbances, medication effect or an underlying mood disorder or medical problem.  4. The study showed evidence of atrial fibrillation and PVCs on single lead EKG; clinical correlation is recommended. 5. The patient should be cautioned not to drive, work at heights, or operate dangerous or heavy equipment when tired or sleepy. Review and reiteration of good sleep hygiene measures should be pursued with any patient. 6. The patient will be seen in follow-up by Dr. Rexene Alberts at [facility] for discussion of the test results and further management strategies. The referring provider will be notified of the test results.  I certify that I have reviewed the entire raw data recording prior to the issuance of this report in accordance with the Standards of Accreditation of the American Academy of Sleep Medicine  (AASM)  Star Age, MD, PhD Diplomat, American Board of Neurology and Sleep Medicine (Neurology and Sleep Medicine)

## 2020-04-11 NOTE — Addendum Note (Signed)
Addended by: Star Age on: 04/11/2020 06:46 PM   Modules accepted: Orders

## 2020-04-11 NOTE — Progress Notes (Signed)
Patient referred by [name], NP, seen by me on 03/14/20, diagnostic PSG on 03/31/20.    Please call and notify the patient that the recent sleep study showed mild obstructive sleep apnea.  Unfortunately, she did not sleep very well.  She had poor sleep consolidation, sleep disruption but given her medical history of stroke and chronic A. fib, I would like to recommend treatment for sleep apnea in the form of AutoPap therapy at home. This means, that we don't have to bring her back for a second sleep study with CPAP, but will let her start using an autoPAP machine at home, through a DME company (of her choice, or as per insurance requirement). The DME representative will educate her on how to use the machine, how to put the mask on, etc. I have placed an order in the chart. Please send referral, talk to patient, send report to referring MD. We will need a FU in sleep clinic for 10 weeks post-PAP set up, please arrange that with me or one of our NPs. Thanks,   Star Age, MD, PhD [facility])

## 2020-04-12 ENCOUNTER — Other Ambulatory Visit: Payer: Self-pay

## 2020-04-12 NOTE — Patient Outreach (Signed)
[facility]) Care Management  04/12/2020  Sarah Gilbert 1946/07/30 [id number]  Telephone outreach to patient to obtain mRS was successfully completed. MRS=1  [facility] Management Assistant [phone]

## 2020-04-12 NOTE — Patient Outreach (Signed)
[facility]) Care Management  04/12/2020  Sarah Gilbert December 15, 1945 [id number]  First telephone outreach attempt to obtain mRS. No answer. Left message for returned call.  [facility] Management Assistant [phone]

## 2020-04-15 ENCOUNTER — Telehealth: Payer: Self-pay

## 2020-04-15 NOTE — Telephone Encounter (Signed)
I called pt. I advised pt that Dr. Rexene Alberts reviewed their sleep study results and found that pt mild osa was found. Dr. Rexene Alberts recommends that pt start an autopap therapy at home for osa and due to hx of stroke on a-fib. I reviewed PAP compliance expectations with the pt. Pt is agreeable to starting an auto-PAP. I advised pt that an order will be sent to a DME, [employer], and [employer] will call the pt within about one week after they file with the pt's insurance. [facility] will show the pt how to use the machine, fit for masks, and troubleshoot the auto-PAP if needed. A follow up appt was made for insurance purposes with Dr. Rexene Alberts  on 06/24/2020 at 1130 am. Pt verbalized understanding to arrive 15 minutes early and bring their auto-PAP. A letter with all of this information in it will be mailed to the pt as a reminder. I verified with the pt that the address we have on file is correct. Pt verbalized understanding of results. Pt had no questions at this time but was encouraged to call back if questions arise. I have sent the order to [employer] and have received confirmation that they have received the order.   Pt called back after this message was completed and sts she would like to hold of on starting the cpap until she can speak with the MD. Pt scheduled for 05/28/2020.

## 2020-04-15 NOTE — Telephone Encounter (Signed)
-----   Message from Star Age, MD sent at 04/11/2020  6:46 PM EDT ----- Patient referred by [name], NP, seen by me on 03/14/20, diagnostic PSG on 03/31/20.    Please call and notify the patient that the recent sleep study showed mild obstructive sleep apnea.  Unfortunately, she did not sleep very well.  She had poor sleep consolidation, sleep disruption but given her medical history of stroke and chronic A. fib, I would like to recommend treatment for sleep apnea in the form of AutoPap therapy at home. This means, that we don't have to bring her back for a second sleep study with CPAP, but will let her start using an autoPAP machine at home, through a DME company (of her choice, or as per insurance requirement). The DME representative will educate her on how to use the machine, how to put the mask on, etc. I have placed an order in the chart. Please send referral, talk to patient, send report to referring MD. We will need a FU in sleep clinic for 10 weeks post-PAP set up, please arrange that with me or one of our NPs. Thanks,   Star Age, MD, PhD [facility])

## 2020-04-17 NOTE — Telephone Encounter (Signed)
Pt was contacted and offered two appts for tomorrow with Dr. Rexene Alberts to discuss her sleep study. Pt reports she has a funeral service to attend tomorrow. Pt will be kept on the wait list.

## 2020-04-30 ENCOUNTER — Ambulatory Visit: Payer: Self-pay | Admitting: Neurology

## 2020-04-30 ENCOUNTER — Telehealth: Payer: Self-pay

## 2020-04-30 NOTE — Telephone Encounter (Signed)
Pt returned phone call, confirm rescheduled 9/16 at 1 pm.

## 2020-04-30 NOTE — Telephone Encounter (Signed)
Noted  

## 2020-04-30 NOTE — Telephone Encounter (Addendum)
I called the pt and left a vm asking for her to call the office back. Dr. Rexene Alberts is out of the office today and we need to reschedule her appt.  I have tentatively rescheduled her for 05/02/2020 at 1 pm if this works for her schedule and Dr. Rexene Alberts feels well enough to return.  Please update the pt on this once she calls back.

## 2020-05-02 ENCOUNTER — Ambulatory Visit: Payer: Medicare Other | Admitting: Neurology

## 2020-05-02 ENCOUNTER — Encounter: Payer: Self-pay | Admitting: Neurology

## 2020-05-02 ENCOUNTER — Other Ambulatory Visit: Payer: Self-pay

## 2020-05-02 VITALS — BP 134/62 | HR 64 | Ht 65.0 in | Wt 213.0 lb

## 2020-05-02 DIAGNOSIS — G4733 Obstructive sleep apnea (adult) (pediatric): Secondary | ICD-10-CM | POA: Diagnosis not present

## 2020-05-02 NOTE — Progress Notes (Signed)
Subjective:    Patient ID: Sarah Gilbert is a 74 y.o. female.  HPI     Interim history:   [name] is a 74 year old right-handed woman with an underlying complex medical history of embolic stroke in May 0964, chronic A. fib, end-stage renal disease with status post kidney transplant in 2011, arthritis, hypertension, hyperlipidemia, anemia, thyroid disease and obesity, who Presents for follow-up consultation of her sleep disorder, after recent sleep testing.  The patient is accompanied by her uncle again today.  I first met her on 03/14/2020 at the request of Sarah Gilbert, nurse practitioner, at which time the patient reported daytime tiredness.  She had a recent history of embolic stroke in May 3838.  She was advised to proceed with sleep study testing.  She had a baseline sleep study on 03/31/2020 which showed evidence of mild obstructive sleep apnea but study was limited due to reduced sleep efficiency.  Sleep latency was 83 minutes, REM latency 123 minutes and sleep efficiency 55.2%.  She had moderate to severe sleep fragmentation, wake after sleep onset was 115.5 minutes.  She had a total AHI of 6.1/h, REM AHI was 12.5/h.  She did not achieve any supine sleep.  Average oxygen saturation was 98%, nadir was 87%.  She did not have any significant PLM's.  EKG showed A. fib and PVCs.  She was advised to consider AutoPap therapy for sleep apnea management, given her test results and medical history.  She requested a follow-up appointment to discuss further.   Today, 05/02/20: She reports no new symptoms, goes to bed early and rises early typically between 430 and 5 AM.  She does have sleep disruption.  She is not quite ready to pursue treatment for her sleep apnea.  We did go over her test results and she also requested to have a printed copy which I provided.  She would like to discuss with her kidney doctor and her cardiologist her sleep apnea diagnosis first.  I explained AutoPap therapy to the  patient and her uncle.  The patient's allergies, current medications, family history, past medical history, past social history, past surgical history and problem list were reviewed and updated as appropriate.   Previously:   03/14/20: (She) reports some daytime tiredness but no frank sleepiness.  She tries to go to bed early and is a early riser.  Her Epworth sleepiness score is 3 out of 24.  He is not sure if she snores.  She is single and has no children.  They have no pets at the house.  Bedtime is generally around 730 and rise time around 5:30 AM.  She is a non-smoker and does not utilize alcohol and drinks very little caffeine, not necessarily daily.  She has no known family history of sleep apnea.  She had a tonsillectomy as a child.   Her Past Medical History Is Significant For: Past Medical History:  Diagnosis Date  . Anemia   . Arthritis   . Atrial fibrillation ([place])   . Bleeding nose    history of   . ESRD (end stage renal disease) (Sarah Gilbert)   . History of bronchitis   . Hyperlipemia   . Hypertension   . Joint inflammation   . Obesity   . PONV (postoperative nausea and vomiting)   . Rotator cuff tear arthropathy of right shoulder 06/18/2015  . Thyroid disease   . Wears glasses     Her Past Surgical History Is Significant For: Past Surgical History:  Procedure  Laterality Date  . ABDOMINAL HYSTERECTOMY    . APPENDECTOMY    . CARDIAC CATHETERIZATION N/A 04/18/2015   Procedure: Left Heart Cath and Coronary Angiography;  Surgeon: Charolette Forward, MD;  Location: [facility];  Service: Cardiovascular;  Laterality: N/A;  . CHOLECYSTECTOMY    . COLONOSCOPY    . IR CT HEAD LTD  01/07/2020  . IR PERCUTANEOUS ART THROMBECTOMY/INFUSION INTRACRANIAL INC DIAG ANGIO  01/07/2020  . IR US GUIDE VASC ACCESS RIGHT  01/07/2020  . KIDNEY TRANSPLANT     june 2011-[facility]  . RADIOLOGY WITH ANESTHESIA N/A 01/07/2020   Procedure: IR WITH ANESTHESIA;  Surgeon: Radiologist, Medication, MD;   Location: [facility];  Service: Radiology;  Laterality: N/A;  . TONSILLECTOMY    . TOTAL SHOULDER ARTHROPLASTY Right 06/18/2015   Procedure: RIGHT TOTAL SHOULDER ARTHROPLASTY;  Surgeon: Marchia Bond, MD;  Location: [facility];  Service: Orthopedics;  Laterality: Right;    Her Family History Is Significant For: Family History  Problem Relation Age of Onset  . Dementia Mother   . Cancer Father     Her Social History Is Significant For: Social History   Socioeconomic History  . Marital status: Single    Spouse name: Not on file  . Number of children: Not on file  . Years of education: Not on file  . Highest education level: Not on file  Occupational History  . Occupation: retired  Tobacco Use  . Smoking status: Never Smoker  . Smokeless tobacco: Never Used  Vaping Use  . Vaping Use: Never used  Substance and Sexual Activity  . Alcohol use: No  . Drug use: No  . Sexual activity: Not Currently  Other Topics Concern  . Not on file  Social History Narrative  . Not on file   Social Determinants of Health   Financial Resource Strain: Low Risk   . Difficulty of Paying Living Expenses: Not hard at all  Food Insecurity: No Food Insecurity  . Worried About Charity fundraiser in the Last Year: Never true  . Ran Out of Food in the Last Year: Never true  Transportation Needs: No Transportation Needs  . Lack of Transportation (Medical): No  . Lack of Transportation (Non-Medical): No  Physical Activity: Inactive  . Days of Exercise per Week: 0 days  . Minutes of Exercise per Session: 0 min  Stress: No Stress Concern Present  . Feeling of Stress : Not at all  Social Connections:   . Frequency of Communication with Friends and Family: Not on file  . Frequency of Social Gatherings with Friends and Family: Not on file  . Attends Religious Services: Not on file  . Active Member of Clubs or Organizations: Not on file  . Attends Archivist Meetings: Not on file  . Marital Status:  Not on file    Her Allergies Are:  Allergies  Allergen Reactions  . Vancomycin Hives, Itching, Rash and Other (See Comments)    Blistering, swelling, and peeling of feet.  Pt denies any similar reactions around face or neck.  . Iohexol      Desc: iodine contrast/per dialysis center, Onset Date: [id number]   :   Her Current Medications Are:  Outpatient Encounter Medications as of 05/02/2020  Medication Sig  . amLODipine (NORVASC) 10 MG tablet Take 5 mg by mouth daily.   Marland Kitchen apixaban (ELIQUIS) 5 MG TABS tablet Take 1 tablet (5 mg total) by mouth 2 (two) times daily.  Marland Kitchen atorvastatin (LIPITOR)  40 MG tablet Take 1 tablet (40 mg total) by mouth daily.  Marland Kitchen b complex-vitamin c-folic acid (NEPHRO-VITE) 0.8 MG TABS Take 0.8 mg by mouth at bedtime.  . furosemide (LASIX) 40 MG tablet Take 40 mg by mouth daily.   . magnesium gluconate (MAGONATE) 500 MG tablet Take 500 mg by mouth 2 (two) times daily.  . metoprolol (LOPRESSOR) 50 MG tablet Take 50 mg by mouth 2 (two) times daily.  . mycophenolate (CELLCEPT) 250 MG capsule Take 500 mg by mouth 2 (two) times daily.  . pantoprazole (PROTONIX) 40 MG tablet Take 40 mg by mouth daily.   . potassium chloride SA (K-DUR,KLOR-CON) 20 MEQ tablet Take 20 mEq by mouth daily.   . tacrolimus (PROGRAF) 0.5 MG capsule Take 0.5 mg by mouth 2 (two) times daily. Take one capsule along with the 52m capsule  . tacrolimus (PROGRAF) 1 MG capsule Take 2 mg by mouth 2 (two) times daily.   . Vitamin D, Ergocalciferol, (DRISDOL) 1.25 MG (50000 UNIT) CAPS capsule Take 1 capsule (50,000 Units total) by mouth 2 (two) times a week.   No facility-administered encounter medications on file as of 05/02/2020.  :  Review of Systems:  Out of a complete 14 point review of systems, all are reviewed and negative with the exception of these symptoms as listed below: Review of Systems  Neurological:       Here to f/u on sleep study. Pt has several questions to review with the MD.      Objective:  Neurological Exam  Physical Exam Physical Examination:   Vitals:   05/02/20 1300  BP: 134/62  Pulse: 64  SpO2: 98%    General Examination: The patient is a very pleasant 74y.o. female in no acute distress. She appears well-developed and well-nourished and well groomed.   HEENT: Normocephalic, atraumatic, pupils are equal, round and reactive to light, extraocular tracking is good. Hearing is grossly intact. Face is symmetric with normal facial animation. Speech is clear with no dysarthria noted. There is no hypophonia. There is no lip, neck/head, jaw or voice tremor.   Chest: Clear to auscultation without wheezing, rhonchi or crackles noted.  Heart: S1+S2+0, irregularly irregular.  No murmurs noted.     Abdomen: Soft, non-tender and non-distended.  Extremities: There is trace to 1+ edema on the right and trace edema in the left leg.  She has AV fistulas.  Skin: Warm and dry without trophic changes noted.   Musculoskeletal: exam reveals mild swelling of the right knee.   Neurologically:  Mental status: The patient is awake, alert and oriented in all 4 spheres. Her immediate and remote memory, attention, language skills and fund of knowledge are appropriate. There is no evidence of aphasia, agnosia, apraxia or anomia. Speech is clear with normal prosody and enunciation. Thought process is linear. Mood is normal and affect is normal.  Cranial nerves II - XII are as described above under HEENT exam.  Motor exam: No obvious change noted.  Sensory exam: intact to light touch in the upper and lower extremities.  Gait, station and balance: She stands with mild difficulty. Walks slowly.   Assessment and Plan:   In summary, AAriyan Sinnettis a very pleasant 74y.o.-year old female with an underlying complex medical history of embolic stroke in May 22992 chronic A. fib, end-stage renal disease with status post kidney transplant in 2011, arthritis,  hypertension, hyperlipidemia, anemia, thyroid disease and obesity, who presents for follow-up consultation of her sleep disturbance  after a baseline sleep study on 03/31/2020.  She had difficulty maintaining sleep.  She had a reduced sleep efficiency but did achieve all stages of sleep. She had a total AHI of 6.1/h, REM AHI was 12.5/h. O2 nadir was 87%.  Given her history of stroke and A. fib, I recommended treatment of her sleep apnea with AutoPap therapy.  We talked about her test results in detail today.  I answered all their questions.  She is not quite ready to pursue treatment.  She is advised that mild sleep apnea does not necessarily increase risk for heart disease or stroke or complications long-term in general but given the limitation of her sleep study with reduced sleep efficiency, there may be an underestimation of her sleep disordered breathing.  Nevertheless, she would like to discuss this also with her cardiologist and her kidney doctor and get back to [name] regarding starting AutoPap therapy.  At this juncture, we will await her phone call when she is ready to start treatment.  In the interim, she is advised to keep her appointment with nurse practitioner in the stroke clinic or she can schedule an appointment with Dr. Leonie Man, whom she saw in the hospital. I answered all their questions today and the patient and her uncle were in agreement.I spent 20 minutes in total face-to-face time and in reviewing records during pre-charting, more than 50% of which was spent in counseling and coordination of care, reviewing test results, reviewing medications and treatment regimen and/or in discussing or reviewing the diagnosis of OSA, the prognosis and treatment options. Pertinent laboratory and imaging test results that were available during this visit with the patient were reviewed by me and considered in my medical decision making (see chart for details).

## 2020-05-02 NOTE — Patient Instructions (Signed)
Given your history of stroke and chronic atrial fibrillation, I recommend that you consider treatment of your overall mild sleep apnea with AutoPap therapy.  As discussed, we can wait till you have a chance to discuss this with your kidney doctor and her doctor.  Please call us back when you ready to start with treatment of your sleep apnea with a machine called AutoPap.  You have an appointment in the stroke clinic with Frann Rider, nurse practitioner in November.  Please keep that appointment as well.

## 2020-05-13 ENCOUNTER — Ambulatory Visit: Payer: Medicare Other | Admitting: Nurse Practitioner

## 2020-05-22 ENCOUNTER — Telehealth: Payer: Self-pay

## 2020-05-22 NOTE — Telephone Encounter (Signed)
Received message from [place],   Please refer to the screenshot below for records of our contact attempts to this patient:    There is not an email address on file, so I am unable to try reaching out via email. Due to the lack of response or follow up, I have voided the order.

## 2020-05-23 ENCOUNTER — Telehealth: Payer: Self-pay

## 2020-05-23 NOTE — Telephone Encounter (Signed)
I called the pt and we reviewed the recommendation from Dr. Rexene Alberts on 05/02/2020.  Given your history of stroke and chronic atrial fibrillation, I recommend that you consider treatment of your overall mild sleep apnea with AutoPap therapy.  As discussed, we can wait till you have a chance to discuss this with your kidney doctor and her doctor.  Please call us back when you ready to start with treatment of your sleep apnea with a machine called AutoPap.  I advised the pt this recommendation still stands and to let you know when she would like to proceed with auto cpap.  Pt verbalized understanding.

## 2020-05-23 NOTE — Telephone Encounter (Signed)
Pt called stating she needs to speak with the doctor again to confirm she needs CPAP because [facility] started calling her and she did not think she was at the point in the process to be set up yet.  She would like a call back to confirm the need for CPAP via [facility].

## 2020-05-28 ENCOUNTER — Ambulatory Visit: Payer: Self-pay | Admitting: Neurology

## 2020-06-04 ENCOUNTER — Telehealth: Payer: Self-pay

## 2020-06-04 NOTE — Telephone Encounter (Signed)
I called pt to schedule her an appointment she needs to be evaluated for PAD per JM. We received her results and they were moderate. Left pt v/m to call the office [place]

## 2020-06-05 ENCOUNTER — Encounter: Payer: Self-pay | Admitting: Nurse Practitioner

## 2020-06-05 ENCOUNTER — Other Ambulatory Visit: Payer: Self-pay

## 2020-06-05 ENCOUNTER — Ambulatory Visit (INDEPENDENT_AMBULATORY_CARE_PROVIDER_SITE_OTHER): Payer: Medicare Other | Admitting: Nurse Practitioner

## 2020-06-05 VITALS — BP 124/80 | HR 65 | Temp 98.3°F | Ht 65.0 in | Wt 216.4 lb

## 2020-06-05 DIAGNOSIS — M7989 Other specified soft tissue disorders: Secondary | ICD-10-CM

## 2020-06-05 DIAGNOSIS — L602 Onychogryphosis: Secondary | ICD-10-CM

## 2020-06-05 DIAGNOSIS — M79604 Pain in right leg: Secondary | ICD-10-CM

## 2020-06-05 DIAGNOSIS — M79671 Pain in right foot: Secondary | ICD-10-CM | POA: Diagnosis not present

## 2020-06-05 NOTE — Progress Notes (Signed)
I,[employer] as a Education administrator for [employer], FNP.,have documented all relevant documentation on the behalf of Sarah Brine, FNP,as directed by  Sarah Brine, FNP while in the presence of Sarah Gilbert, Sarah Gilbert. This visit occurred during the SARS-CoV-2 public health emergency.  Safety protocols were in place, including screening questions prior to the visit, additional usage of staff PPE, and extensive cleaning of exam room while observing appropriate contact time as indicated for disinfecting solutions.  Subjective:     Patient ID: Sarah Gilbert , female    DOB: [birth year] , 74 y.o.   MRN: [id number]   Chief Complaint  Patient presents with  . PAD    HPI  She has continued swelling to her right calf. She also has swelling when she wakes up.  When she is on her feet she has pain.  After walking for a while she will have pain.  Denies numbness and tingling.    Dr. Edrick Oh referred her to Sarah Gilbert and seen "Dr. Pearla Gilbert (patient states).  She continues to have her great toenail that is thickened.  She would like a referral to another foot provider.      Past Medical History:  Diagnosis Date  . Anemia   . Arthritis   . Atrial fibrillation ([place])   . Bleeding nose    history of   . ESRD (end stage renal disease) ([place])   . History of bronchitis   . Hyperlipemia   . Hypertension   . Joint inflammation   . Obesity   . PONV (postoperative nausea and vomiting)   . Rotator cuff tear arthropathy of right shoulder 06/18/2015  . Thyroid disease   . Wears glasses      Family History  Problem Relation Age of Onset  . Dementia Mother   . Cancer Father      Current Outpatient Medications:  .  amLODipine (NORVASC) 10 MG tablet, Take 5 mg by mouth daily. , Disp: , Rfl:  .  apixaban (ELIQUIS) 5 MG TABS tablet, Take 1 tablet (5 mg total) by mouth 2 (two) times daily., Disp: 60 tablet, Rfl: 2 .  atorvastatin (LIPITOR) 40 MG tablet, Take 1 tablet (40 mg  total) by mouth daily., Disp: 30 tablet, Rfl: 2 .  b complex-vitamin c-folic acid (NEPHRO-VITE) 0.8 MG TABS, Take 0.8 mg by mouth at bedtime., Disp: , Rfl:  .  furosemide (LASIX) 40 MG tablet, Take 40 mg by mouth daily. , Disp: , Rfl:  .  magnesium gluconate (MAGONATE) 500 MG tablet, Take 500 mg by mouth 2 (two) times daily., Disp: , Rfl:  .  metoprolol (LOPRESSOR) 50 MG tablet, Take 50 mg by mouth 2 (two) times daily., Disp: , Rfl:  .  mycophenolate (CELLCEPT) 250 MG capsule, Take 500 mg by mouth 2 (two) times daily., Disp: , Rfl:  .  pantoprazole (PROTONIX) 40 MG tablet, Take 40 mg by mouth daily. , Disp: , Rfl:  .  potassium chloride SA (K-DUR,KLOR-CON) 20 MEQ tablet, Take 20 mEq by mouth daily. , Disp: , Rfl:  .  tacrolimus (PROGRAF) 0.5 MG capsule, Take 0.5 mg by mouth 2 (two) times daily. Take one capsule along with the 1mg  capsule, Disp: , Rfl:  .  tacrolimus (PROGRAF) 1 MG capsule, Take 2 mg by mouth 2 (two) times daily. , Disp: , Rfl:  .  Vitamin D, Ergocalciferol, (DRISDOL) 1.25 MG (50000 UNIT) CAPS capsule, Take 1 capsule (50,000 Units total) by mouth 2 (two) times  a week., Disp: 12 capsule, Rfl: 2   Allergies  Allergen Reactions  . Vancomycin Hives, Itching, Rash and Other (See Comments)    Blistering, swelling, and peeling of feet.  Pt denies any similar reactions around face or neck.  . Iohexol      Desc: iodine contrast/per dialysis center, Onset Date: [id number]      Review of Systems  Constitutional: Negative.   Respiratory: Negative.   Cardiovascular: Negative.  Negative for chest pain, palpitations and leg swelling.  Musculoskeletal:       Right lower leg pain and swelling  Neurological: Negative for dizziness and headaches.  Psychiatric/Behavioral: Negative.      Today's Vitals   06/05/20 1009  BP: 124/80  Pulse: 65  Temp: 98.3 F (36.8 C)  TempSrc: Oral  Weight: 216 lb 6.4 oz (98.2 kg)  Height: 5\' 5"  (1.651 m)  PainSc: 0-No pain   Body mass index is 36.01  kg/m.   Objective:  Physical Exam Constitutional:      General: She is not in acute distress.    Appearance: Normal appearance.  Cardiovascular:     Rate and Rhythm: Normal rate.     Pulses: Normal pulses.     Heart sounds: Normal heart sounds. No murmur heard.   Pulmonary:     Effort: Pulmonary effort is normal. No respiratory distress.     Breath sounds: Normal breath sounds. No wheezing.  Musculoskeletal:     Right lower leg: Edema (slightly swollen and has pain to her right leg) present.     Left lower leg: No edema.  Skin:    General: Skin is warm and dry.     Coloration: Skin is not jaundiced.     Comments: Toenails are thickened.   Neurological:     General: No focal deficit present.     Mental Status: She is alert and oriented to person, place, and time.     Cranial Nerves: No cranial nerve deficit.  Psychiatric:        Mood and Affect: Mood normal.        Behavior: Behavior normal.        Thought Content: Thought content normal.        Judgment: Judgment normal.         Assessment And Plan:     1. Right leg pain  Pain to right leg  She had a screening with her home care NP had moderate signs of PAD  Will refer to vascular for further evaluation - Ambulatory referral to Vascular Surgery  2. Right leg swelling  Swelling noted to right leg - Ambulatory referral to Vascular Surgery  3. Right foot pain  She has been having pain to her right foot more recently   4. Hypertrophic toenail   will check to see what the plan was with her foot provider before referring to another foot provider  She is to get the foot doctor's name for [name] to make sure who she seen. I will request records from [place] as well to see what the treatment plan is.   Patient was given opportunity to ask questions. Patient verbalized understanding of the plan and was able to repeat key elements of the plan. All questions were answered to their satisfaction.   Teola Bradley,  FNP, have reviewed all documentation for this visit. The documentation on 06/13/20 for the exam, diagnosis, procedures, and orders are all accurate and complete.   THE PATIENT IS ENCOURAGED TO PRACTICE  SOCIAL DISTANCING DUE TO THE COVID-19 PANDEMIC.

## 2020-06-17 ENCOUNTER — Encounter: Payer: Self-pay | Admitting: Adult Health

## 2020-06-17 ENCOUNTER — Ambulatory Visit: Payer: Medicare Other | Admitting: Adult Health

## 2020-06-17 VITALS — BP 136/72 | HR 68 | Ht 65.0 in | Wt 216.0 lb

## 2020-06-17 DIAGNOSIS — E782 Mixed hyperlipidemia: Secondary | ICD-10-CM

## 2020-06-17 DIAGNOSIS — G4733 Obstructive sleep apnea (adult) (pediatric): Secondary | ICD-10-CM

## 2020-06-17 DIAGNOSIS — I10 Essential (primary) hypertension: Secondary | ICD-10-CM | POA: Diagnosis not present

## 2020-06-17 DIAGNOSIS — I482 Chronic atrial fibrillation, unspecified: Secondary | ICD-10-CM

## 2020-06-17 DIAGNOSIS — Z8673 Personal history of transient ischemic attack (TIA), and cerebral infarction without residual deficits: Secondary | ICD-10-CM

## 2020-06-17 NOTE — Progress Notes (Signed)
[facility] [address] [place]. [place] [address] [phone]       STROKE FOLLOW UP NOTE  [name] Date of Birth:  04/26/1946 Medical Record Number:  [id number]   Reason for Referral: stroke follow up    SUBJECTIVE:   CHIEF COMPLAINT:  Chief Complaint  Patient presents with  . Follow-up    rm 9  . Cerebrovascular Accident    HPI:   Today, 06/17/2020, Sarah Gilbert returns for stroke follow-up accompanied by her uncle.  C/o short term memory difficulty since her stroke such as misplacing items as well as difficulty with comprehension or remembering what is being told her.  This has been stable without worsening.  She is able to maintain her ADLs and majority of IADLs independently. Hx of mild short-term memory impairment prior to her stroke.  She was evaluated by speech therapy at home and discharged as SLP not indicated per patient.  Denies new or worsening stroke/TIA symptoms.  She remains on Eliquis and atorvastatin for secondary stroke prevention without side effects.  Blood pressure today 136/72.  Evaluated by Dr. Rexene Alberts for possible sleep apnea undergoing baseline sleep study on 03/31/2020 which did show evidence of mild sleep apnea and recommended initiating AutoPap therapy.  She was should further speak with her cardiologist and nephrologist prior to initiating who recommended treatment for sleep apnea and is now ready to proceed with AutoPap therapy.  No further concerns at this time.   History provided for reference purposes only Initial visit 02/13/2020 JM: Sarah Gilbert is being seen for hospital follow-up accompanied by her uncle.  She has been doing well since discharge without residual stroke deficits.  Participated in [facility] PT/OT which has since been completed.  She does report increased fatigue since her stroke.  Sleeps well at night.  Sleeps alone but denies snoring or witnessed apneas.  She has not previously underwent sleep study to rule  out sleep apnea.  Continues on Eliquis 5 mg twice daily without bleeding or bruising.  Continues to follow with cardiologist Dr. Terrence Dupont routinely.  Continues on atorvastatin 40 mg daily without myalgias.  Blood pressure today 143/80.  No concerns at this time.  Stroke admission 01/07/2020 Sarah Gilbert a 74 y.o.femalewith history of double kidney transplant (2011), HTN, . A fib (on xarelto), HLDwho presented on 01/07/2020 with L isded weakness, R gaze, L facial and slurred speech. Stroke work-up revealed several small right MCA infarcts w/ right ICA terminus thrombus s/p TICI 3 revascularization, infarcts embolic secondary to known AF off AC.  MRA head/neck unremarkable post IR.  Previously on Xarelto but per report, missed 3 to 4 days due to running out of prescription and transition to Eliquis 5 mg twice daily for secondary stroke prevention and history of atrial fibrillation.  History of HTN and resumed home amlodipine, Lasix and metoprolol.  History of HLD with LDL 74 and increased home dose atorvastatin from 20 mg to 40 mg daily.  Other stroke risk factors include advanced age and obesity but no prior stroke history.  Other active problems include ESRD s/p bilateral kidney transplant on Prograf and CellCept (not on HD), thyroid disease and hypokalemia.  Evaluated by therapies and discharged home with home health PT/OT.  Stroke:Severeal small R MCAinfarctsembolic secondary to known AF off AC  Code Stroke CT headhyperdense R supraclinoid ICA.ASPECTS 10.  Cerebral angioR ICA terminus thrombus s/p TICI3 revascularization w/ 2 passes([place])  Post IR CTno hemorrhage, no statin  MRISeveral small scattered R  hemisphere infarcts  MRA headmild intracranial atherosclerosis   MRA neckUnremarkable  2D EchoEF60-65%. No source of embolus. LA dilated  LDL74 -increase atorvastatin from 20 mg to 40 mg daily  HgbA1c6.1  Xarelto (rivaroxaban) dailyprior to admission  but had missed 3-4 days, now onEliquis 5 mg bid.   Therapy recommendations:HH PT, HH OT - [facility]  Disposition:return home      ROS:   14 system review of systems performed and negative with exception of short-term memory loss  PMH:  Past Medical History:  Diagnosis Date  . Anemia   . Arthritis   . Atrial fibrillation ([place])   . Bleeding nose    history of   . ESRD (end stage renal disease) ([place])   . History of bronchitis   . Hyperlipemia   . Hypertension   . Joint inflammation   . Obesity   . PONV (postoperative nausea and vomiting)   . Rotator cuff tear arthropathy of right shoulder 06/18/2015  . Thyroid disease   . Wears glasses     PSH:  Past Surgical History:  Procedure Laterality Date  . ABDOMINAL HYSTERECTOMY    . APPENDECTOMY    . CARDIAC CATHETERIZATION N/A 04/18/2015   Procedure: Left Heart Cath and Coronary Angiography;  Surgeon: Charolette Forward, MD;  Location: [place] [facility];  Service: Cardiovascular;  Laterality: N/A;  . CHOLECYSTECTOMY    . COLONOSCOPY    . IR CT HEAD LTD  01/07/2020  . IR PERCUTANEOUS ART THROMBECTOMY/INFUSION INTRACRANIAL INC DIAG ANGIO  01/07/2020  . IR US GUIDE VASC ACCESS RIGHT  01/07/2020  . KIDNEY TRANSPLANT     june 2011-[facility]  . RADIOLOGY WITH ANESTHESIA N/A 01/07/2020   Procedure: IR WITH ANESTHESIA;  Surgeon: Radiologist, Medication, MD;  Location: [place];  Service: Radiology;  Laterality: N/A;  . TONSILLECTOMY    . TOTAL SHOULDER ARTHROPLASTY Right 06/18/2015   Procedure: RIGHT TOTAL SHOULDER ARTHROPLASTY;  Surgeon: Marchia Bond, MD;  Location: [place];  Service: Orthopedics;  Laterality: Right;    Social History:  Social History   Socioeconomic History  . Marital status: Single    Spouse name: Not on file  . Number of children: Not on file  . Years of education: Not on file  . Highest education level: Not on file  Occupational History  . Occupation: retired  Tobacco Use  . Smoking status: Never Smoker  .  Smokeless tobacco: Never Used  Vaping Use  . Vaping Use: Never used  Substance and Sexual Activity  . Alcohol use: No  . Drug use: No  . Sexual activity: Not Currently  Other Topics Concern  . Not on file  Social History Narrative  . Not on file   Social Determinants of Health   Financial Resource Strain: Low Risk   . Difficulty of Paying Living Expenses: Not hard at all  Food Insecurity: No Food Insecurity  . Worried About Charity fundraiser in the Last Year: Never true  . Ran Out of Food in the Last Year: Never true  Transportation Needs: No Transportation Needs  . Lack of Transportation (Medical): No  . Lack of Transportation (Non-Medical): No  Physical Activity: Inactive  . Days of Exercise per Week: 0 days  . Minutes of Exercise per Session: 0 min  Stress: No Stress Concern Present  . Feeling of Stress : Not at all  Social Connections:   . Frequency of Communication with Friends and Family: Not on file  . Frequency of Social Gatherings  with Friends and Family: Not on file  . Attends Religious Services: Not on file  . Active Member of Clubs or Organizations: Not on file  . Attends Archivist Meetings: Not on file  . Marital Status: Not on file  Intimate Partner Violence:   . Fear of Current or Ex-Partner: Not on file  . Emotionally Abused: Not on file  . Physically Abused: Not on file  . Sexually Abused: Not on file    Family History:  Family History  Problem Relation Age of Onset  . Dementia Mother   . Cancer Father     Medications:   Current Outpatient Medications on File Prior to Visit  Medication Sig Dispense Refill  . amLODipine (NORVASC) 10 MG tablet Take 5 mg by mouth daily.     Marland Kitchen apixaban (ELIQUIS) 5 MG TABS tablet Take 1 tablet (5 mg total) by mouth 2 (two) times daily. 60 tablet 2  . atorvastatin (LIPITOR) 40 MG tablet Take 1 tablet (40 mg total) by mouth daily. 30 tablet 2  . b complex-vitamin c-folic acid (NEPHRO-VITE) 0.8 MG TABS Take  0.8 mg by mouth at bedtime.    . furosemide (LASIX) 40 MG tablet Take 40 mg by mouth daily.     . magnesium gluconate (MAGONATE) 500 MG tablet Take 500 mg by mouth 2 (two) times daily.    . metoprolol (LOPRESSOR) 50 MG tablet Take 50 mg by mouth 2 (two) times daily.    . mycophenolate (CELLCEPT) 250 MG capsule Take 500 mg by mouth 2 (two) times daily.    . pantoprazole (PROTONIX) 40 MG tablet Take 40 mg by mouth daily.     . potassium chloride SA (K-DUR,KLOR-CON) 20 MEQ tablet Take 20 mEq by mouth daily.     . tacrolimus (PROGRAF) 0.5 MG capsule Take 0.5 mg by mouth 2 (two) times daily. Take one capsule along with the 1mg  capsule    . tacrolimus (PROGRAF) 1 MG capsule Take 2 mg by mouth 2 (two) times daily.     . Vitamin D, Ergocalciferol, (DRISDOL) 1.25 MG (50000 UNIT) CAPS capsule Take 1 capsule (50,000 Units total) by mouth 2 (two) times a week. 12 capsule 2   No current facility-administered medications on file prior to visit.    Allergies:   Allergies  Allergen Reactions  . Vancomycin Hives, Itching, Rash and Other (See Comments)    Blistering, swelling, and peeling of feet.  Pt denies any similar reactions around face or neck.  . Iohexol      Desc: iodine contrast/per dialysis center, Onset Date: [id number]       OBJECTIVE:  Physical Exam  Vitals:   06/17/20 0844  BP: 136/72  Pulse: 68  Weight: 216 lb (98 kg)  Height: 5\' 5"  (1.651 m)   Body mass index is 35.94 kg/m. No exam data present  General: well developed, well nourished, pleasant elderly African-American female, seated, in no evident distress Head: head normocephalic and atraumatic.   Neck: supple with no carotid or supraclavicular bruits Cardiovascular: irregular rate and rhythm, no murmurs Musculoskeletal: no deformity Skin:  no rash/petichiae Vascular:  Normal pulses all extremities   Neurologic Exam Mental Status: Awake and fully alert.   Fluent speech and language.  Oriented to place and time. Recent  memory subjectively impaired and remote memory intact. Attention span and concentration impaired repeating questions and needing repeat clarification and fund of knowledge appropriate. Mood and affect appropriate. Recall 2/3. Serial addition good. 4 legged animal 6  in 60 seconds. Clock drawing 4/4 Cranial Nerves: Pupils equal, briskly reactive to light. Extraocular movements full without nystagmus. Visual fields full to confrontation. Hearing intact. Facial sensation intact. Face, tongue, palate moves normally and symmetrically.  Motor: Normal bulk and tone. Normal strength in all tested extremity muscles. Sensory.: intact to touch , pinprick , position and vibratory sensation.  Coordination: Rapid alternating movements normal in all extremities except slightly decreased left hand. Finger-to-nose and heel-to-shin performed accurately bilaterally. Mild action tremor right hand (chronic) Gait and Station: Arises from chair without difficulty. Stance is normal. Gait demonstrates normal stride length and balance with use of cane Reflexes: 1+ and symmetric. Toes downgoing.        ASSESSMENT/PLAN: Malyna Budney is a 74 y.o. year old female presented with left-sided weakness, right gaze preference, left facial droop and slurred speech on 01/07/2020 with stroke work-up revealing several small right MCA infarcts w/ right ICA terminus thrombus s/p TICI 3 revascularization, infarct embolic secondary to known AF off AC (missed 3-4 doses of Xarelto).  Transitioned to Eliquis for secondary stroke prevention.  Vascular risk factors include HTN, HLD, A. Fib, obesity and recent diagnosis of mild OSA.      1. Right MCA infarcts:  a. Residual deficits: Short-term memory complaints with baseline memory impairment prior to stroke likely mild cognitive impairment.  Recommend memory exercises such as crossword puzzles, word search, sudoku, card games and reading as well as importance of managing stroke risk factors  and initiating AutoPap for new diagnosis of sleep apnea. b. Continue Eliquis (apixaban) daily  and atorvastatin 40 mg daily for secondary stroke prevention. c. Discussed secondary stroke prevention and importance of close PCP follow-up for management of stroke risk factors for secondary stroke prevention  2. Atrial fibrillation: a. CHA2DS2-VASc score 5 b. On Eliquis 5 mg twice daily for secondary stroke prevention per PCP  3. Obstructive sleep apnea a. Baseline sleep study 03/31/2020 by Dr. Rexene Alberts which showed mild OSA b. She is now ready to initiate CPAP therapy therefore will notify Dr. Rexene Alberts  4. HTN:  -BP goal<130/90.  Stable today. -on amlodipine, furosemide and metoprolol per PCP -Continue to follow with PCP for management of above medications and routine monitoring  5. HLD:  -LDL goal<70 -on atorvastatin 40 mg daily per PCP     Follow up in 6 months or call earlier if needed   I spent 30 minutes of face-to-face and non-face-to-face time with patient and uncle.  This included previsit chart review, lab review, study review, order entry, electronic health record documentation, patient education regarding recent stroke, residual deficits, new diagnosis of sleep apnea and importance of initiating CPAP, importance of managing stroke risk factors and answered all questions to patient and uncles satisfaction   Frann Rider, AGNP-BC  [facility] [address] [place], [place] [address]  Phone [phone] Fax [phone] Note: This document was prepared with digital dictation and possible smart phrase technology. Any transcriptional errors that result from this process are unintentional.

## 2020-06-17 NOTE — Progress Notes (Signed)
I agree with the above plan 

## 2020-06-17 NOTE — Progress Notes (Signed)
Sarah Gilbert is a 74 y.o. female here for a f/u from a stroke. Pt said she is concerned about her memory. Pt is with her Uncle and her comprehension is off. H has to explain things over and over again.

## 2020-06-17 NOTE — Patient Instructions (Signed)
Recommend memory exercises such as cross word puzzles, word search, reading, and card games. Also starting CPAP for sleep apnea as untreated apnea can affect memory  We will reach out to [facility] to start CPAP therapy - you will be contacted by them to initiate machine  Continue Eliquis (apixaban) daily  and atorvastatin  for secondary stroke prevention  Continue to follow up with PCP regarding cholesterol and blood pressure management  Maintain strict control of hypertension with blood pressure goal below 130/90 and cholesterol with LDL cholesterol (bad cholesterol) goal below 70 mg/dL.     Follow up in 6 months or call earlier if needed     Thank you for coming to see [name] at [facility]. I hope we have been able to provide you high quality care today.  You may receive a patient satisfaction survey over the next few weeks. We would appreciate your feedback and comments so that we may continue to improve ourselves and the health of our patients.    Cognitive Rehabilitation After a Stroke After a stroke, you may have various problems with thinking (cognitive disability). The types of problems you have will depend on how severe the stroke was and where it was located in the brain. Problems may include:  Problems with short-term memory.  Trouble paying attention.  Trouble communicating or understanding language (aphasia).  A drop in mental ability that may interfere with daily life (dementia).  Trouble with problem-solving and information processing.  Problems with reading, writing, or math.  Problems with your ability to plan and to perform activities in sequence (executive function). These problems can feel overwhelming. However, with rehabilitation and time to heal, many people have improvement in their symptoms. What causes cognitive disability? A stroke happens when blood cannot flow to certain areas of the brain. When this happens, brain cells die in the  affected areas because they cannot get oxygen and nutrients from the blood. Cognitive disability is caused by the death of cells in the areas of the brain that control thinking. What is cognitive rehabilitation? Cognitive rehabilitation is a program to help you improve your thinking skills after a stroke. Rehabilitation cannot completely reverse the effects of a stroke, but it can help you with memory, problem-solving, and communication skills. Therapy focuses on:  Improving brain function. This may involve activities such as learning to break down tasks into simple steps.  Helping you learn ways to cope with thinking problems. For example, you might learn memory tricks or do activities that stimulate memory, such as naming objects or describing pictures. Cognitive rehabilitation may include:  Speech-language therapy to help you understand and use language to communicate.  Occupational therapy to help you perform daily activities.  Music therapy to help relieve stress, anxiety, and depression. This may involve listening to music, singing, or playing instruments.  Physical therapy to help improve your ability to move and perform actions that involve the muscles (motor functions). When will therapy start and where will I have therapy? Your health care provider will decide when it is best for you to start therapy. In some cases, people start rehabilitation as soon as their health is stable, which may be 24-48 hours after the stroke. Rehabilitation can take place in a few different places, based on your needs. It may take place in:  The hospital or an in-patient rehabilitation hospital.  An outpatient rehabilitation facility.  A long-term care facility.  A community rehabilitation clinic.  Your home. What are some tools to  help after a stroke? There are a number of tools and apps that you can use on your smartphone, personal computer, or tablet to help improve brain function. Some of these  apps include:  Calendar reminders or alarm apps to help with memory.  Note-taking or sketch pad apps to help with memory or communication.  Text-to-speech apps that allow you to listen to what you are reading, which helps your ability to understanding text.  Picture dictionary or picture message apps to help with communication.  E-readers. These can highlight text as it is read aloud, which helps with listening and reading skills. How can my friends or family help during my rehabilitation? During your recovery, it is important that your friends and family members help you work toward more independence. Your caregivers should speak with your health care providers to learn how they can best help you during recovery. This may include working on speech-language or memory exercises at home, or helping with daily tasks and errands. If you have cognitive disability, you may be at risk for injury or accidents at home, such as forgetting to turn off the stove. Friends and family members can help ensure home safety by taking steps such as getting appliances with automatic shut-off features or storing dangerous objects in a secure place. What else should I know about cognitive rehabilitation after a stroke? Having trouble with memory and problem-solving can make you feel alone. You may also have mood changes, anxiety, or depression after a stroke. It is important to:  Stay connected with others through social groups, online support groups, or your community.  Talk to your friends, family, and caregivers about any emotional problems you are having.  Go to one-on-one or group therapy as suggested by your health care provider.  Stay physically active and exercise as often as suggested by your health care provider. Summary  After a stroke, some people have problems with thinking that affect attention, memory, language, communication, and problem-solving.  Cognitive rehabilitation is a program to help  you regain brain function and learn skills to cope with thinking problems.  Rehabilitation cannot completely reverse the effects of a stroke, but it can help to improve quality of life.  Cognitive rehabilitation may include speech-language therapy, occupational therapy, music therapy, and physical therapy. This information is not intended to replace advice given to you by your health care provider. Make sure you discuss any questions you have with your health care provider. Document Revised: 11/23/2018 Document Reviewed: 11/06/2016 Elsevier Patient Education  [place].

## 2020-06-18 ENCOUNTER — Telehealth: Payer: Self-pay

## 2020-06-18 NOTE — Telephone Encounter (Signed)
I called pt. I had and extended conversation with her. She is agreeable to starting cpap. I gave her [facility]'s phone number to call. She reports that she was never told she needed to start cpap before and that is why she declined it from [place] initially. I discussed compliance requirements (usage > 4 hours daily) with her. A follow up appt was scheduled for 10/07/2020 at 10:45am with Sarah Billow, NP. Pt verbalized understanding of recommendations and of appt date and time.

## 2020-06-18 NOTE — Telephone Encounter (Signed)
-----   Message from Frann Rider, NP sent at 06/17/2020  5:26 PM EDT ----- Ms. Mcbrien previously seen by Dr. Rexene Alberts for sleep apnea.  I saw her today for stroke follow-up and she is now ready to proceed with initiating CPAP.  Are you able to further assist with this?  Thank you!

## 2020-06-19 NOTE — Telephone Encounter (Signed)
Pt called back needing to discuss further with RN. Please advise.

## 2020-06-19 NOTE — Telephone Encounter (Signed)
I called pt. I was advised by a female that she was not home. He will ask her to call [facility] back.

## 2020-06-24 ENCOUNTER — Ambulatory Visit: Payer: Self-pay | Admitting: Neurology

## 2020-06-24 NOTE — Telephone Encounter (Signed)
I called pt. She wanted to know who checks insurance for the cpap. I advised her that [facility] will check this. Pt reports that she was told [facility] would be back in touch with her in 4-6 weeks to discuss the cpap. Pt confirms that she has [facility]'s phone number.

## 2020-07-04 ENCOUNTER — Encounter: Payer: Self-pay | Admitting: Nurse Practitioner

## 2020-07-04 ENCOUNTER — Other Ambulatory Visit: Payer: Self-pay

## 2020-07-04 ENCOUNTER — Ambulatory Visit (INDEPENDENT_AMBULATORY_CARE_PROVIDER_SITE_OTHER): Payer: Medicare Other | Admitting: Nurse Practitioner

## 2020-07-04 VITALS — BP 132/80 | HR 73 | Temp 98.7°F | Ht 65.0 in | Wt 204.6 lb

## 2020-07-04 DIAGNOSIS — R059 Cough, unspecified: Secondary | ICD-10-CM

## 2020-07-04 DIAGNOSIS — J00 Acute nasopharyngitis [common cold]: Secondary | ICD-10-CM | POA: Diagnosis not present

## 2020-07-04 NOTE — Progress Notes (Signed)
I,Yamilka Roman [employer] as a Education administrator for [employer], FNP.,have documented all relevant documentation on the behalf of [name], FNP,as directed by  [name], FNP while in the presence of [name], [place]. This visit occurred during the SARS-CoV-2 public health emergency.  Safety protocols were in place, including screening questions prior to the visit, additional usage of staff PPE, and extensive cleaning of exam room while observing appropriate contact time as indicated for disinfecting solutions.  Subjective:     Patient ID: Sarah Gilbert , female    DOB: 27-Sep-1945 , 74 y.o.   MRN: [id number]   Chief Complaint  Patient presents with  . Cough    patient stated she did have a cough on monday but it went away yesterday.     HPI  She is here today after having cough on Monday but had a left side pain.  She also needs help with her papers with [facility].   She is fully vaccinated for covid.  She does not have any additional symptoms.     Past Medical History:  Diagnosis Date  . Anemia   . Arthritis   . Atrial fibrillation ([place])   . Bleeding nose    history of   . ESRD (end stage renal disease) ([place])   . History of bronchitis   . Hyperlipemia   . Hypertension   . Joint inflammation   . Obesity   . PONV (postoperative nausea and vomiting)   . Rotator cuff tear arthropathy of right shoulder 06/18/2015  . Thyroid disease   . Wears glasses      Family History  Problem Relation Age of Onset  . Dementia Mother   . Cancer Father      Current Outpatient Medications:  .  amLODipine (NORVASC) 10 MG tablet, Take 5 mg by mouth daily. , Disp: , Rfl:  .  apixaban (ELIQUIS) 5 MG TABS tablet, Take 1 tablet (5 mg total) by mouth 2 (two) times daily., Disp: 60 tablet, Rfl: 2 .  atorvastatin (LIPITOR) 40 MG tablet, Take 1 tablet (40 mg total) by mouth daily., Disp: 30 tablet, Rfl: 2 .  b complex-vitamin c-folic acid (NEPHRO-VITE) 0.8 MG TABS, Take 0.8 mg by  mouth at bedtime., Disp: , Rfl:  .  furosemide (LASIX) 40 MG tablet, Take 40 mg by mouth daily. , Disp: , Rfl:  .  magnesium gluconate (MAGONATE) 500 MG tablet, Take 500 mg by mouth 2 (two) times daily., Disp: , Rfl:  .  metoprolol (LOPRESSOR) 50 MG tablet, Take 50 mg by mouth 2 (two) times daily., Disp: , Rfl:  .  mycophenolate (CELLCEPT) 250 MG capsule, Take 500 mg by mouth 2 (two) times daily., Disp: , Rfl:  .  pantoprazole (PROTONIX) 40 MG tablet, Take 40 mg by mouth daily. , Disp: , Rfl:  .  potassium chloride SA (K-DUR,KLOR-CON) 20 MEQ tablet, Take 20 mEq by mouth daily. , Disp: , Rfl:  .  tacrolimus (PROGRAF) 0.5 MG capsule, Take 0.5 mg by mouth 2 (two) times daily. Take one capsule along with the 1mg  capsule, Disp: , Rfl:  .  tacrolimus (PROGRAF) 1 MG capsule, Take 2 mg by mouth 2 (two) times daily. , Disp: , Rfl:  .  Vitamin D, Ergocalciferol, (DRISDOL) 1.25 MG (50000 UNIT) CAPS capsule, Take 1 capsule (50,000 Units total) by mouth 2 (two) times a week., Disp: 12 capsule, Rfl: 2   Allergies  Allergen Reactions  . Vancomycin Hives, Itching, Rash and Other (  See Comments)    Blistering, swelling, and peeling of feet.  Pt denies any similar reactions around face or neck.  . Iohexol      Desc: iodine contrast/per dialysis center, Onset Date: [id number]      Review of Systems  Constitutional: Negative.   Respiratory: Positive for cough (she did have a cough on Monday however now is resolved - wanted to be sure).   Cardiovascular: Negative.  Negative for chest pain, palpitations and leg swelling.  Neurological: Negative.  Negative for dizziness.  Psychiatric/Behavioral: Negative.      Today's Vitals   07/04/20 1159  BP: 132/80  Pulse: 73  Temp: 98.7 F (37.1 C)  Weight: 204 lb 9.6 oz (92.8 kg)  Height: 5\' 5"  (1.651 m)  PainSc: 0-No pain   Body mass index is 34.05 kg/m.   Objective:  Physical Exam Vitals reviewed.  Constitutional:      General: She is not in acute  distress.    Appearance: Normal appearance. She is obese.  Cardiovascular:     Rate and Rhythm: Normal rate and regular rhythm.     Pulses: Normal pulses.     Heart sounds: Normal heart sounds. No murmur heard.   Pulmonary:     Effort: Pulmonary effort is normal. No respiratory distress.     Breath sounds: Normal breath sounds.  Neurological:     General: No focal deficit present.     Mental Status: She is oriented to person, place, and time.     Cranial Nerves: No cranial nerve deficit.  Psychiatric:        Mood and Affect: Mood normal.        Behavior: Behavior normal.        Thought Content: Thought content normal.        Judgment: Judgment normal.         Assessment And Plan:     1. Cough  No further coughing, lungs are clear bilaterally  Will check for covid to ensure is negative and due to the sudden onset of symptoms - Novel Coronavirus, NAA ([facility])  2. Acute rhinitis - Novel Coronavirus, NAA ([facility])   Also assisted her with completing forms for her upcoming appt with vein and vascular  Patient was given opportunity to ask questions. Patient verbalized understanding of the plan and was able to repeat key elements of the plan. All questions were answered to their satisfaction.    Teola Bradley, FNP, have reviewed all documentation for this visit. The documentation on 07/05/20 for the exam, diagnosis, procedures, and orders are all accurate and complete.  THE PATIENT IS ENCOURAGED TO PRACTICE SOCIAL DISTANCING DUE TO THE COVID-19 PANDEMIC.

## 2020-07-06 LAB — SARS-COV-2, NAA 2 DAY TAT

## 2020-07-06 LAB — NOVEL CORONAVIRUS, NAA: SARS-CoV-2, NAA: DETECTED — AB

## 2020-07-07 ENCOUNTER — Telehealth: Payer: Self-pay

## 2020-07-07 ENCOUNTER — Telehealth: Payer: Self-pay | Admitting: Nurse Practitioner

## 2020-07-07 NOTE — Telephone Encounter (Signed)
Called to Discuss with patient about Covid symptoms and the use of the monoclonal antibody infusion for those with mild to moderate Covid symptoms and at a high risk of hospitalization.     Pt appears to qualify for this infusion due to co-morbid conditions and/or a member of an at-risk group in accordance with the FDA Emergency Use Authorization.    She developed a little cough and congestion about a week ago. She had runny nose as well.  The only symptoms she has now is runny/congested nose but very mild. She is fully vaccinated including booster shot.   She accepted our information and will monitor symptoms today.    Janene Madeira, MSN, NP-C [facility] for Infectious Disease [email or web address] Pager: [phone] Office: [phone] [place]: [phone]

## 2020-07-07 NOTE — Telephone Encounter (Signed)
Pt notified of positive COVID-19 test results. Pt verbalized understanding. Pt reports that she is not having any symptoms.Pt advised to remain in self quarantine until at least 10 days since symptom onset And at least 24 hours fever free without antipyretics And improvement in respiratory symptoms. Patient advised to utilize over the counter medications to treat symptoms. Pt advised to seek treatment in the ED if respiratory issues/distress develops.Pt advised they should only leave home to seek and medical care and must wear a mask in public. Pt instructed to limit contact with family members or caregivers in the home. Pt advised to practice social distancing and to continue to use good preventative care measures such has frequent hand washing, staying out of crowds and cleaning hard surfaces frequently touched in the home.Pt informed that the health department will likely follow up and may have additional recommendations.

## 2020-07-07 NOTE — Telephone Encounter (Signed)
Called patient to discuss Covid symptoms and the use of casirivimab/imdevimab, a monoclonal antibody infusion for those with mild to moderate Covid symptoms and at a high risk of hospitalization.  Pt is qualified for this infusion at the [facility] infusion center due to; Specific high risk criteria : Older age (>/= 72 yo); BMI; HTN   Message left to call back our hotline [phone].  Kyriaki Moder, NP  

## 2020-07-09 ENCOUNTER — Ambulatory Visit: Payer: Medicare Other | Admitting: Nurse Practitioner

## 2020-07-26 ENCOUNTER — Other Ambulatory Visit: Payer: Self-pay | Admitting: Nurse Practitioner

## 2020-07-26 DIAGNOSIS — E559 Vitamin D deficiency, unspecified: Secondary | ICD-10-CM

## 2020-07-30 ENCOUNTER — Other Ambulatory Visit: Payer: Self-pay

## 2020-07-30 DIAGNOSIS — I739 Peripheral vascular disease, unspecified: Secondary | ICD-10-CM

## 2020-07-31 ENCOUNTER — Ambulatory Visit: Payer: Medicare Other | Admitting: Nurse Practitioner

## 2020-08-13 ENCOUNTER — Other Ambulatory Visit: Payer: Self-pay

## 2020-08-13 ENCOUNTER — Encounter: Payer: Self-pay | Admitting: Vascular Surgery

## 2020-08-13 ENCOUNTER — Ambulatory Visit: Payer: Medicare Other | Admitting: Vascular Surgery

## 2020-08-13 ENCOUNTER — Ambulatory Visit (HOSPITAL_COMMUNITY)
Admission: RE | Admit: 2020-08-13 | Discharge: 2020-08-13 | Disposition: A | Discharge status: Home / Self Care | Payer: Medicare Other | Source: Ambulatory Visit | Attending: Vascular Surgery | Admitting: Vascular Surgery

## 2020-08-13 DIAGNOSIS — R609 Edema, unspecified: Secondary | ICD-10-CM | POA: Diagnosis not present

## 2020-08-13 DIAGNOSIS — I739 Peripheral vascular disease, unspecified: Secondary | ICD-10-CM | POA: Insufficient documentation

## 2020-08-13 NOTE — Progress Notes (Signed)
ASSESSMENT & PLAN:  74 y.o. female with episodic left lower extremity swelling.  I counseled her that there are multitude of causes for swelling.  She has no discrete vascular cause for her symptoms.  No evidence of chronic venous insufficiency on physical exam.  ABIs today are normal.  Return to primary care physician for evaluation of other causes of swelling.  That compression garments and elevation will help with symptoms of swelling.  Return as needed.  CHIEF COMPLAINT:   Swelling  HISTORY:  HISTORY OF PRESENT ILLNESS: Sarah Gilbert is a 74 y.o. female referred to clinic for evaluation of left lower extremity swelling.  She reports this will occur episodically.  Swelling is associated with some discomfort attributed to tightness.  Nothing seems to be associated with the swelling temporally.  No exacerbating or ameliorating features to swelling.  She is ambulatory.  She has no symptoms consistent with intermittent claudication.  No symptoms consistent with ischemic rest pain.  No symptoms of ischemic ulceration.  Past Medical History:  Diagnosis Date  . Anemia   . Arthritis   . Atrial fibrillation ([place])   . Bleeding nose    history of   . ESRD (end stage renal disease) ([place])   . History of bronchitis   . Hyperlipemia   . Hypertension   . Joint inflammation   . Obesity   . PONV (postoperative nausea and vomiting)   . Rotator cuff tear arthropathy of right shoulder 06/18/2015  . Thyroid disease   . Wears glasses     Past Surgical History:  Procedure Laterality Date  . ABDOMINAL HYSTERECTOMY    . APPENDECTOMY    . CARDIAC CATHETERIZATION N/A 04/18/2015   Procedure: Left Heart Cath and Coronary Angiography;  Surgeon: Charolette Forward, MD;  Location: [facility];  Service: Cardiovascular;  Laterality: N/A;  . CHOLECYSTECTOMY    . COLONOSCOPY    . IR CT HEAD LTD  01/07/2020  . IR PERCUTANEOUS ART THROMBECTOMY/INFUSION INTRACRANIAL INC DIAG ANGIO  01/07/2020  . IR US  GUIDE VASC ACCESS RIGHT  01/07/2020  . KIDNEY TRANSPLANT     june 2011-[facility]  . RADIOLOGY WITH ANESTHESIA N/A 01/07/2020   Procedure: IR WITH ANESTHESIA;  Surgeon: Radiologist, Medication, MD;  Location: [facility];  Service: Radiology;  Laterality: N/A;  . TONSILLECTOMY    . TOTAL SHOULDER ARTHROPLASTY Right 06/18/2015   Procedure: RIGHT TOTAL SHOULDER ARTHROPLASTY;  Surgeon: Marchia Bond, MD;  Location: [facility];  Service: Orthopedics;  Laterality: Right;    Family History  Problem Relation Age of Onset  . Dementia Mother   . Cancer Father     Social History   Socioeconomic History  . Marital status: Single    Spouse name: Not on file  . Number of children: Not on file  . Years of education: Not on file  . Highest education level: Not on file  Occupational History  . Occupation: retired  Tobacco Use  . Smoking status: Never Smoker  . Smokeless tobacco: Never Used  Vaping Use  . Vaping Use: Never used  Substance and Sexual Activity  . Alcohol use: No  . Drug use: No  . Sexual activity: Not Currently  Other Topics Concern  . Not on file  Social History Narrative  . Not on file   Social Determinants of Health   Financial Resource Strain: Low Risk   . Difficulty of Paying Living Expenses: Not hard at all  Food Insecurity: No Food Insecurity  .  Worried About Charity fundraiser in the Last Year: Never true  . Ran Out of Food in the Last Year: Never true  Transportation Needs: No Transportation Needs  . Lack of Transportation (Medical): No  . Lack of Transportation (Non-Medical): No  Physical Activity: Inactive  . Days of Exercise per Week: 0 days  . Minutes of Exercise per Session: 0 min  Stress: No Stress Concern Present  . Feeling of Stress : Not at all  Social Connections: Not on file  Intimate Partner Violence: Not on file    Allergies  Allergen Reactions  . Vancomycin Hives, Itching, Rash and Other (See Comments)    Blistering, swelling, and peeling of feet.   Pt denies any similar reactions around face or neck.  . Iohexol      Desc: iodine contrast/per dialysis center, Onset Date: [id number]     Current Outpatient Medications  Medication Sig Dispense Refill  . amLODipine (NORVASC) 10 MG tablet Take 5 mg by mouth daily.     Marland Kitchen apixaban (ELIQUIS) 5 MG TABS tablet Take 1 tablet (5 mg total) by mouth 2 (two) times daily. 60 tablet 2  . atorvastatin (LIPITOR) 40 MG tablet Take 1 tablet (40 mg total) by mouth daily. 30 tablet 2  . b complex-vitamin c-folic acid (NEPHRO-VITE) 0.8 MG TABS Take 0.8 mg by mouth at bedtime.    . furosemide (LASIX) 40 MG tablet Take 40 mg by mouth daily.    . magnesium gluconate (MAGONATE) 500 MG tablet Take 500 mg by mouth 2 (two) times daily.    . metoprolol (LOPRESSOR) 50 MG tablet Take 50 mg by mouth 2 (two) times daily.    . mycophenolate (CELLCEPT) 250 MG capsule Take 500 mg by mouth 2 (two) times daily.    . pantoprazole (PROTONIX) 40 MG tablet Take 40 mg by mouth daily.     . potassium chloride SA (K-DUR,KLOR-CON) 20 MEQ tablet Take 20 mEq by mouth daily.     . tacrolimus (PROGRAF) 0.5 MG capsule Take 0.5 mg by mouth 2 (two) times daily. Take one capsule along with the 1mg  capsule    . tacrolimus (PROGRAF) 1 MG capsule Take 2 mg by mouth 2 (two) times daily.     . Vitamin D, Ergocalciferol, (DRISDOL) 1.25 MG (50000 UNIT) CAPS capsule TAKE 1 CAPSULE BY MOUTH 2 TIMES A WEEK 12 capsule 2   No current facility-administered medications for this visit.    REVIEW OF SYSTEMS:  [X]  denotes positive finding, [ ]  denotes negative finding Cardiac  Comments:  Chest pain or chest pressure:    Shortness of breath upon exertion:    Short of breath when lying flat:    Irregular heart rhythm:        Vascular    Pain in calf, thigh, or hip brought on by ambulation: x   Pain in feet at night that wakes you up from your sleep:     Blood clot in your veins:    Leg swelling:  x       Pulmonary    Oxygen at home:    Productive  cough:     Wheezing:         Neurologic    Sudden weakness in arms or legs:     Sudden numbness in arms or legs:     Sudden onset of difficulty speaking or slurred speech:    Temporary loss of vision in one eye:     Problems with dizziness:  Gastrointestinal    Blood in stool:     Vomited blood:         Genitourinary    Burning when urinating:     Blood in urine:        Psychiatric    Major depression:         Hematologic    Bleeding problems:    Problems with blood clotting too easily:        Skin    Rashes or ulcers:        Constitutional    Fever or chills:     PHYSICAL EXAM:   Vitals:   08/13/20 1235  BP: 100/62  Pulse: 72  Resp: 20  Temp: 97.8 F (36.6 C)  SpO2: 98%  Weight: 203 lb (92.1 kg)  Height: 5\' 5"  (1.651 m)   Constitutional: Well appearing in no distress. Appears well nourished.  Neurologic: Normal gait and station. CN intact. No weakness. No sensory loss. Psychiatric: Mood and affect symmetric and appropriate. Eyes: No icterus. No conjunctival pallor. Ears, nose, throat: mucous membranes moist. Midline trachea. No carotid bruit. Cardiac: regular rate and rhythm.  Respiratory: unlabored. Abdominal: soft, non-tender, non-distended. No palpable pulsatile abdominal mass. Peripheral vascular:  1+ pedal pulses Extremity: No edema. No cyanosis. No pallor.  Skin: No gangrene. No ulceration.  Lymphatic: No Stemmer's sign. No palpable lymphadenopathy.   DATA REVIEW:    Most recent CBC CBC Latest Ref Rng & Units 01/09/2020 01/07/2020 01/07/2020  WBC 4.0 - 10.5 K/uL 5.8 - 6.5  Hemoglobin 12.0 - 15.0 g/dL 12.0 15.0 13.8  Hematocrit 36.0 - 46.0 % 36.5 44.0 42.0  Platelets 150 - 400 K/uL 224 - 222     Most recent CMP CMP Latest Ref Rng & Units 04/03/2020 01/09/2020 01/07/2020  Glucose 65 - 99 mg/dL 104(H) 100(H) 166(H)  BUN 8 - 27 mg/dL 11 <5(L) 12  Creatinine 0.57 - 1.00 mg/dL 1.03(H) 0.79 0.80  Sodium 134 - 144 mmol/L 143 142 141   Potassium 3.5 - 5.2 mmol/L 4.1 3.3(L) 3.4(L)  Chloride 96 - 106 mmol/L 103 109 105  CO2 20 - 29 mmol/L 25 24 -  Calcium 8.7 - 10.3 mg/dL 10.3 9.2 -  Total Protein 6.0 - 8.5 g/dL 7.5 - -  Total Bilirubin 0.0 - 1.2 mg/dL 0.8 - -  Alkaline Phos 48 - 121 IU/L 127(H) - -  AST 0 - 40 IU/L 15 - -  ALT 0 - 32 IU/L 12 - -    Renal function CrCl cannot be calculated (Patient's most recent lab result is older than the maximum 21 days allowed.).  Hgb A1c MFr Bld (%)  Date Value  04/03/2020 5.9 (H)    LDL Chol Calc (NIH)  Date Value Ref Range Status  04/03/2020 71 0 - 99 mg/dL Final     Vascular Imaging: ABI 08/13/20    Yevonne Aline. Stanford Breed, MD [facility] Phone Number: [phone] 08/13/2020 12:43 PM

## 2020-10-02 NOTE — Telephone Encounter (Signed)
Message received from [place]; I have attempted to call Ms. Aird four times now, and have left voicemails each time. I am trying to get her in and scheduled seeing as her RX is from late August. Is there any way you have any other contact information on her? I do not have an email address on file, but I have called both the following numbers in desperate attempts to reach her:

## 2020-10-07 ENCOUNTER — Ambulatory Visit: Payer: Self-pay | Admitting: Adult Health

## 2020-10-08 ENCOUNTER — Ambulatory Visit: Payer: Self-pay | Admitting: Adult Health

## 2020-11-12 ENCOUNTER — Telehealth: Payer: Self-pay

## 2020-11-12 NOTE — Telephone Encounter (Signed)
Called pt trying to make an appointment for cough she had a concern about. Pt refused virtual being that she is not good with technology. Pt wanted to come into the office and did not want to schedule virtual appointment.

## 2020-12-04 ENCOUNTER — Ambulatory Visit (INDEPENDENT_AMBULATORY_CARE_PROVIDER_SITE_OTHER): Payer: Medicare Other | Admitting: Nurse Practitioner

## 2020-12-04 ENCOUNTER — Encounter: Payer: Self-pay | Admitting: Nurse Practitioner

## 2020-12-04 ENCOUNTER — Telehealth: Payer: Self-pay

## 2020-12-04 ENCOUNTER — Other Ambulatory Visit: Payer: Self-pay

## 2020-12-04 ENCOUNTER — Ambulatory Visit (INDEPENDENT_AMBULATORY_CARE_PROVIDER_SITE_OTHER): Payer: Medicare Other

## 2020-12-04 VITALS — BP 110/70 | HR 84 | Temp 98.2°F | Ht 63.4 in | Wt 198.6 lb

## 2020-12-04 VITALS — BP 110/70 | HR 84 | Temp 98.2°F | Ht 63.4 in | Wt 198.0 lb

## 2020-12-04 DIAGNOSIS — I10 Essential (primary) hypertension: Secondary | ICD-10-CM | POA: Diagnosis not present

## 2020-12-04 DIAGNOSIS — R82998 Other abnormal findings in urine: Secondary | ICD-10-CM

## 2020-12-04 DIAGNOSIS — E559 Vitamin D deficiency, unspecified: Secondary | ICD-10-CM

## 2020-12-04 DIAGNOSIS — I482 Chronic atrial fibrillation, unspecified: Secondary | ICD-10-CM

## 2020-12-04 DIAGNOSIS — Z1231 Encounter for screening mammogram for malignant neoplasm of breast: Secondary | ICD-10-CM

## 2020-12-04 DIAGNOSIS — Z0000 Encounter for general adult medical examination without abnormal findings: Secondary | ICD-10-CM

## 2020-12-04 DIAGNOSIS — Z8673 Personal history of transient ischemic attack (TIA), and cerebral infarction without residual deficits: Secondary | ICD-10-CM

## 2020-12-04 DIAGNOSIS — Z23 Encounter for immunization: Secondary | ICD-10-CM

## 2020-12-04 DIAGNOSIS — D849 Immunodeficiency, unspecified: Secondary | ICD-10-CM

## 2020-12-04 DIAGNOSIS — R7309 Other abnormal glucose: Secondary | ICD-10-CM

## 2020-12-04 DIAGNOSIS — E782 Mixed hyperlipidemia: Secondary | ICD-10-CM

## 2020-12-04 DIAGNOSIS — I739 Peripheral vascular disease, unspecified: Secondary | ICD-10-CM

## 2020-12-04 DIAGNOSIS — R413 Other amnesia: Secondary | ICD-10-CM

## 2020-12-04 LAB — POCT UA - MICROALBUMIN
Creatinine, POC: 300 mg/dL
Microalbumin Ur, POC: 150 mg/L

## 2020-12-04 LAB — POCT URINALYSIS DIPSTICK
Bilirubin, UA: NEGATIVE
Glucose, UA: NEGATIVE
Ketones, UA: NEGATIVE
Nitrite, UA: NEGATIVE
Protein, UA: POSITIVE — AB
Spec Grav, UA: 1.015 (ref 1.010–1.025)
Urobilinogen, UA: 0.2 E.U./dL
pH, UA: 6.5 (ref 5.0–8.0)

## 2020-12-04 MED ORDER — SHINGRIX 50 MCG/0.5ML IM SUSR: 0.5000 mL | Freq: Once | INTRAMUSCULAR | 0 refills | Status: AC

## 2020-12-04 MED ORDER — PREVNAR 20 0.5 ML IM SUSY: 0.5000 mL | PREFILLED_SYRINGE | INTRAMUSCULAR | 0 refills | Status: AC

## 2020-12-04 NOTE — Patient Instructions (Signed)
Sarah Gilbert , Thank you for taking time to come for your Medicare Wellness Visit. I appreciate your ongoing commitment to your health goals. Please review the following plan we discussed and let me know if I can assist you in the future.   Screening recommendations/referrals: Colonoscopy: completed 09/10/2011 Mammogram: ordered today Bone Density: completed 11/01/2017 Recommended yearly ophthalmology/optometry visit for glaucoma screening and checkup Recommended yearly dental visit for hygiene and checkup  Vaccinations: Influenza vaccine: completed 05/27/2020 Pneumococcal vaccine: sent to pharmacy Tdap vaccine: completed 06/04/2014, due 06/04/2024 Shingles vaccine: sent to pharmacy   Covid-19: 05/18/2020, 11/07/2019, 10/13/2019  Advanced directives: Advance directive discussed with you today. Even though you declined this today please call our office should you change your mind and we can give you the proper paperwork for you to fill out.  Conditions/risks identified: none  Next appointment: Follow up in one year for your annual wellness visit    Preventive Care 65 Years and Older, Female Preventive care refers to lifestyle choices and visits with your health care provider that can promote health and wellness. What does preventive care include?  A yearly physical exam. This is also called an annual well check.  Dental exams once or twice a year.  Routine eye exams. Ask your health care provider how often you should have your eyes checked.  Personal lifestyle choices, including:  Daily care of your teeth and gums.  Regular physical activity.  Eating a healthy diet.  Avoiding tobacco and drug use.  Limiting alcohol use.  Practicing safe sex.  Taking low-dose aspirin every day.  Taking vitamin and mineral supplements as recommended by your health care provider. What happens during an annual well check? The services and screenings done by your health care provider during  your annual well check will depend on your age, overall health, lifestyle risk factors, and family history of disease. Counseling  Your health care provider may ask you questions about your:  Alcohol use.  Tobacco use.  Drug use.  Emotional well-being.  Home and relationship well-being.  Sexual activity.  Eating habits.  History of falls.  Memory and ability to understand (cognition).  Work and work Statistician.  Reproductive health. Screening  You may have the following tests or measurements:  Height, weight, and BMI.  Blood pressure.  Lipid and cholesterol levels. These may be checked every 5 years, or more frequently if you are over 79 years old.  Skin check.  Lung cancer screening. You may have this screening every year starting at age 53 if you have a 30-pack-year history of smoking and currently smoke or have quit within the past 15 years.  Fecal occult blood test (FOBT) of the stool. You may have this test every year starting at age 10.  Flexible sigmoidoscopy or colonoscopy. You may have a sigmoidoscopy every 5 years or a colonoscopy every 10 years starting at age 54.  Hepatitis C blood test.  Hepatitis B blood test.  Sexually transmitted disease (STD) testing.  Diabetes screening. This is done by checking your blood sugar (glucose) after you have not eaten for a while (fasting). You may have this done every 1-3 years.  Bone density scan. This is done to screen for osteoporosis. You may have this done starting at age 44.  Mammogram. This may be done every 1-2 years. Talk to your health care provider about how often you should have regular mammograms. Talk with your health care provider about your test results, treatment options, and if necessary, the need  for more tests. Vaccines  Your health care provider may recommend certain vaccines, such as:  Influenza vaccine. This is recommended every year.  Tetanus, diphtheria, and acellular pertussis (Tdap,  Td) vaccine. You may need a Td booster every 10 years.  Zoster vaccine. You may need this after age 38.  Pneumococcal 13-valent conjugate (PCV13) vaccine. One dose is recommended after age 17.  Pneumococcal polysaccharide (PPSV23) vaccine. One dose is recommended after age 62. Talk to your health care provider about which screenings and vaccines you need and how often you need them. This information is not intended to replace advice given to you by your health care provider. Make sure you discuss any questions you have with your health care provider. Document Released: 08/30/2015 Document Revised: 04/22/2016 Document Reviewed: 06/04/2015 Elsevier Interactive Patient Education  2017 [employer] Prevention in the Home Falls can cause injuries. They can happen to people of all ages. There are many things you can do to make your home safe and to help prevent falls. What can I do on the outside of my home?  Regularly fix the edges of walkways and driveways and fix any cracks.  Remove anything that might make you trip as you walk through a door, such as a raised step or threshold.  Trim any bushes or trees on the path to your home.  Use bright outdoor lighting.  Clear any walking paths of anything that might make someone trip, such as rocks or tools.  Regularly check to see if handrails are loose or broken. Make sure that both sides of any steps have handrails.  Any raised decks and porches should have guardrails on the edges.  Have any leaves, snow, or ice cleared regularly.  Use sand or salt on walking paths during winter.  Clean up any spills in your garage right away. This includes oil or grease spills. What can I do in the bathroom?  Use night lights.  Install grab bars by the toilet and in the tub and shower. Do not use towel bars as grab bars.  Use non-skid mats or decals in the tub or shower.  If you need to sit down in the shower, use a plastic, non-slip  stool.  Keep the floor dry. Clean up any water that spills on the floor as soon as it happens.  Remove soap buildup in the tub or shower regularly.  Attach bath mats securely with double-sided non-slip rug tape.  Do not have throw rugs and other things on the floor that can make you trip. What can I do in the bedroom?  Use night lights.  Make sure that you have a light by your bed that is easy to reach.  Do not use any sheets or blankets that are too big for your bed. They should not hang down onto the floor.  Have a firm chair that has side arms. You can use this for support while you get dressed.  Do not have throw rugs and other things on the floor that can make you trip. What can I do in the kitchen?  Clean up any spills right away.  Avoid walking on wet floors.  Keep items that you use a lot in easy-to-reach places.  If you need to reach something above you, use a strong step stool that has a grab bar.  Keep electrical cords out of the way.  Do not use floor polish or wax that makes floors slippery. If you must use wax, use  non-skid floor wax.  Do not have throw rugs and other things on the floor that can make you trip. What can I do with my stairs?  Do not leave any items on the stairs.  Make sure that there are handrails on both sides of the stairs and use them. Fix handrails that are broken or loose. Make sure that handrails are as long as the stairways.  Check any carpeting to make sure that it is firmly attached to the stairs. Fix any carpet that is loose or worn.  Avoid having throw rugs at the top or bottom of the stairs. If you do have throw rugs, attach them to the floor with carpet tape.  Make sure that you have a light switch at the top of the stairs and the bottom of the stairs. If you do not have them, ask someone to add them for you. What else can I do to help prevent falls?  Wear shoes that:  Do not have high heels.  Have rubber bottoms.  Are  comfortable and fit you well.  Are closed at the toe. Do not wear sandals.  If you use a stepladder:  Make sure that it is fully opened. Do not climb a closed stepladder.  Make sure that both sides of the stepladder are locked into place.  Ask someone to hold it for you, if possible.  Clearly mark and make sure that you can see:  Any grab bars or handrails.  First and last steps.  Where the edge of each step is.  Use tools that help you move around (mobility aids) if they are needed. These include:  Canes.  Walkers.  Scooters.  Crutches.  Turn on the lights when you go into a dark area. Replace any light bulbs as soon as they burn out.  Set up your furniture so you have a clear path. Avoid moving your furniture around.  If any of your floors are uneven, fix them.  If there are any pets around you, be aware of where they are.  Review your medicines with your doctor. Some medicines can make you feel dizzy. This can increase your chance of falling. Ask your doctor what other things that you can do to help prevent falls. This information is not intended to replace advice given to you by your health care provider. Make sure you discuss any questions you have with your health care provider. Document Released: 05/30/2009 Document Revised: 01/09/2016 Document Reviewed: 09/07/2014 Elsevier Interactive Patient Education  2017 [employer].

## 2020-12-04 NOTE — Progress Notes (Signed)
Rutherford Nail as a scribe for [name], FNP.,have documented all relevant documentation on the behalf of [name], FNP,as directed by  [name], FNP while in the presence of [name], [place]. This visit occurred during the SARS-CoV-2 public health emergency.  Safety protocols were in place, including screening questions prior to the visit, additional usage of staff PPE, and extensive cleaning of exam room while observing appropriate contact time as indicated for disinfecting solutions.  Subjective:     Patient ID: Sarah Gilbert , female    DOB: 10-02-1945 , 75 y.o.   MRN: [id number]   Chief Complaint  Patient presents with  . Hypertension    HPI  Pt here today f/u on b/p she is compliant with all meds and has no other complaints today. She talks about being an only child by her mother and getting her things together.   Wt Readings from Last 3 Encounters: 12/04/20 : 198 lb (89.8 kg) 12/04/20 : 198 lb 9.6 oz (90.1 kg) 08/13/20 : 203 lb (92.1 kg)   Hypertension This is a chronic problem. The current episode started more than 1 year ago. The problem is controlled. Pertinent negatives include no anxiety, chest pain, headaches, palpitations or shortness of breath. There are no associated agents to hypertension. Risk factors for coronary artery disease include obesity. Past treatments include calcium channel blockers and diuretics. The current treatment provides significant improvement. There are no compliance problems.  There is no history of angina. There is no history of chronic renal disease.     Past Medical History:  Diagnosis Date  . Anemia   . Arthritis   . Atrial fibrillation ([place])   . Bleeding nose    history of   . ESRD (end stage renal disease) ([place])   . History of bronchitis   . Hyperlipemia   . Hypertension   . Joint inflammation   . Obesity   . PONV (postoperative nausea and vomiting)   . Rotator cuff tear arthropathy of right shoulder  06/18/2015  . Thyroid disease   . Wears glasses      Family History  Problem Relation Age of Onset  . Dementia Mother   . Cancer Father      Current Outpatient Medications:  .  amLODipine (NORVASC) 10 MG tablet, Take 5 mg by mouth daily. , Disp: , Rfl:  .  apixaban (ELIQUIS) 5 MG TABS tablet, Take 1 tablet (5 mg total) by mouth 2 (two) times daily., Disp: 60 tablet, Rfl: 2 .  atorvastatin (LIPITOR) 40 MG tablet, Take 1 tablet (40 mg total) by mouth daily., Disp: 30 tablet, Rfl: 2 .  b complex-vitamin c-folic acid (NEPHRO-VITE) 0.8 MG TABS, Take 0.8 mg by mouth at bedtime., Disp: , Rfl:  .  furosemide (LASIX) 40 MG tablet, Take 40 mg by mouth daily., Disp: , Rfl:  .  magnesium gluconate (MAGONATE) 500 MG tablet, Take 500 mg by mouth 2 (two) times daily., Disp: , Rfl:  .  metoprolol (LOPRESSOR) 50 MG tablet, Take 50 mg by mouth 2 (two) times daily., Disp: , Rfl:  .  mycophenolate (CELLCEPT) 250 MG capsule, Take 500 mg by mouth 2 (two) times daily., Disp: , Rfl:  .  pantoprazole (PROTONIX) 40 MG tablet, Take 40 mg by mouth daily. , Disp: , Rfl:  .  potassium chloride SA (K-DUR,KLOR-CON) 20 MEQ tablet, Take 20 mEq by mouth daily. , Disp: , Rfl:  .  tacrolimus (PROGRAF) 0.5 MG capsule, Take 0.5 mg by  mouth 2 (two) times daily. Take one capsule along with the 60m capsule, Disp: , Rfl:  .  tacrolimus (PROGRAF) 1 MG capsule, Take 2 mg by mouth 2 (two) times daily. , Disp: , Rfl:  .  Vitamin D, Ergocalciferol, (DRISDOL) 1.25 MG (50000 UNIT) CAPS capsule, TAKE 1 CAPSULE BY MOUTH 2 TIMES A WEEK, Disp: 12 capsule, Rfl: 2   Allergies  Allergen Reactions  . Vancomycin Hives, Itching, Rash and Other (See Comments)    Blistering, swelling, and peeling of feet.  Pt denies any similar reactions around face or neck.  . Iohexol      Desc: iodine contrast/per dialysis center, Onset Date: [id number]     Review of Systems  Constitutional: Negative.  Negative for fatigue.  HENT: Negative.   Respiratory:  Negative for shortness of breath.   Cardiovascular: Negative for chest pain and palpitations.  Endocrine: Negative for polydipsia, polyphagia and polyuria.  Musculoskeletal: Negative.   Skin: Negative.   Neurological: Negative for dizziness and headaches.  Psychiatric/Behavioral: Negative.      Today's Vitals   12/04/20 1010  BP: 110/70  Pulse: 84  Temp: 98.2 F (36.8 C)  TempSrc: Oral  SpO2: 99%  Weight: 198 lb (89.8 kg)  Height: 5' 3.4" (1.61 m)   Body mass index is 34.63 kg/m.  Wt Readings from Last 3 Encounters:  12/16/20 201 lb (91.2 kg)  12/04/20 198 lb (89.8 kg)  12/04/20 198 lb 9.6 oz (90.1 kg)   Objective:  Physical Exam Constitutional:      General: She is not in acute distress.    Appearance: Normal appearance. She is obese.  Cardiovascular:     Rate and Rhythm: Normal rate and regular rhythm.     Pulses: Normal pulses.     Heart sounds: Normal heart sounds. No murmur heard.   Pulmonary:     Effort: Pulmonary effort is normal. No respiratory distress.     Breath sounds: Normal breath sounds. No wheezing.  Musculoskeletal:     Right lower leg: No edema.     Left lower leg: No edema.  Skin:    General: Skin is warm and dry.  Neurological:     General: No focal deficit present.     Mental Status: She is alert and oriented to person, place, and time.     Cranial Nerves: No cranial nerve deficit.  Psychiatric:        Mood and Affect: Mood normal.        Behavior: Behavior normal.        Thought Content: Thought content normal.        Judgment: Judgment normal.         Assessment And Plan:     1. Hypertension, essential  Chronic, blood pressure is well controlled  2. Abnormal glucose  Chronic, stable.  Diet controlled - Hemoglobin A1c  3. Mixed hyperlipidemia  Chronic, controlled  Continue with current medications, tolerating medications well - Lipid panel  4. Vitamin D deficiency  Will check vitamin D level and supplement as  needed.     Also encouraged to spend 15 minutes in the sun daily.   5. Short-term memory loss  6CIT is normal  Will check vitamin B12  Memory changes may be related to her stroke  Long conversation about her memory changes   Declines wanting to start any medications - Vitamin B12  6. History of ischemic stroke  - CMP14+EGFR  7. PAD (peripheral artery disease) (HCC) Chronic, continue  with eliquis and statin  8. Immunosuppression ([place]) She is on prograf for previous kidney transplant  9. Chronic atrial fibrillation (HCC) Continue with eliquis  10. Urine white blood cells increased  White cells in urine will send for culture - Culture, Urine     Patient was given opportunity to ask questions. Patient verbalized understanding of the plan and was able to repeat key elements of the plan. All questions were answered to their satisfaction.  [name], FNP   I, [name], FNP, have reviewed all documentation for this visit. The documentation on 12/04/20 for the exam, diagnosis, procedures, and orders are all accurate and complete.   IF YOU HAVE BEEN REFERRED TO A SPECIALIST, IT MAY TAKE 1-2 WEEKS TO SCHEDULE/PROCESS THE REFERRAL. IF YOU HAVE NOT HEARD FROM US/SPECIALIST IN TWO WEEKS, PLEASE GIVE [name] A CALL AT [phone] X [phone].   THE PATIENT IS ENCOURAGED TO PRACTICE SOCIAL DISTANCING DUE TO THE COVID-19 PANDEMIC.

## 2020-12-04 NOTE — Progress Notes (Signed)
This visit occurred during the SARS-CoV-2 public health emergency.  Safety protocols were in place, including screening questions prior to the visit, additional usage of staff PPE, and extensive cleaning of exam room while observing appropriate contact time as indicated for disinfecting solutions.  Subjective:   Sarah Gilbert is a 75 y.o. female who presents for Medicare Annual (Subsequent) preventive examination.  Review of Systems     Cardiac Risk Factors include: advanced age (>31men, >59 women);dyslipidemia;hypertension;obesity (BMI >30kg/m2);sedentary lifestyle     Objective:    Today's Vitals   12/04/20 0942  BP: 110/70  Pulse: 84  Temp: 98.2 F (36.8 C)  TempSrc: Oral  SpO2: 99%  Weight: 198 lb 9.6 oz (90.1 kg)  Height: 5' 3.4" (1.61 m)   Body mass index is 34.74 kg/m.  Advanced Directives 12/04/2020 01/07/2020 01/07/2020 11/29/2019 09/29/2018 06/06/2015 04/18/2015  Does Patient Have a Medical Advance Directive? No No No No No No No  Type of Advance Directive - - - - - - -  Would patient like information on creating a medical advance directive? No - Patient declined No - Patient declined - Yes (MAU/Ambulatory/Procedural Areas - Information given) Yes (MAU/Ambulatory/Procedural Areas - Information given) Yes - Educational materials given No - patient declined information    Current Medications (verified) Outpatient Encounter Medications as of 12/04/2020  Medication Sig  . amLODipine (NORVASC) 10 MG tablet Take 5 mg by mouth daily.   Marland Kitchen apixaban (ELIQUIS) 5 MG TABS tablet Take 1 tablet (5 mg total) by mouth 2 (two) times daily.  Marland Kitchen atorvastatin (LIPITOR) 40 MG tablet Take 1 tablet (40 mg total) by mouth daily.  Marland Kitchen b complex-vitamin c-folic acid (NEPHRO-VITE) 0.8 MG TABS Take 0.8 mg by mouth at bedtime.  . furosemide (LASIX) 40 MG tablet Take 40 mg by mouth daily.  . magnesium gluconate (MAGONATE) 500 MG tablet Take 500 mg by mouth 2 (two) times daily.  . metoprolol  (LOPRESSOR) 50 MG tablet Take 50 mg by mouth 2 (two) times daily.  . mycophenolate (CELLCEPT) 250 MG capsule Take 500 mg by mouth 2 (two) times daily.  . pantoprazole (PROTONIX) 40 MG tablet Take 40 mg by mouth daily.   . potassium chloride SA (K-DUR,KLOR-CON) 20 MEQ tablet Take 20 mEq by mouth daily.   . tacrolimus (PROGRAF) 0.5 MG capsule Take 0.5 mg by mouth 2 (two) times daily. Take one capsule along with the 1mg  capsule  . tacrolimus (PROGRAF) 1 MG capsule Take 2 mg by mouth 2 (two) times daily.   . Vitamin D, Ergocalciferol, (DRISDOL) 1.25 MG (50000 UNIT) CAPS capsule TAKE 1 CAPSULE BY MOUTH 2 TIMES A WEEK  . [DISCONTINUED] Vitamin D, Ergocalciferol, (DRISDOL) 1.25 MG (50000 UNIT) CAPS capsule Take 1 capsule (50,000 Units total) by mouth 2 (two) times a week.   No facility-administered encounter medications on file as of 12/04/2020.    Allergies (verified) Vancomycin and Iohexol   History: Past Medical History:  Diagnosis Date  . Anemia   . Arthritis   . Atrial fibrillation ([place])   . Bleeding nose    history of   . ESRD (end stage renal disease) ([place])   . History of bronchitis   . Hyperlipemia   . Hypertension   . Joint inflammation   . Obesity   . PONV (postoperative nausea and vomiting)   . Rotator cuff tear arthropathy of right shoulder 06/18/2015  . Thyroid disease   . Wears glasses    Past Surgical History:  Procedure Laterality Date  .  ABDOMINAL HYSTERECTOMY    . APPENDECTOMY    . CARDIAC CATHETERIZATION N/A 04/18/2015   Procedure: Left Heart Cath and Coronary Angiography;  Surgeon: Charolette Forward, MD;  Location: [facility];  Service: Cardiovascular;  Laterality: N/A;  . CHOLECYSTECTOMY    . COLONOSCOPY    . IR CT HEAD LTD  01/07/2020  . IR PERCUTANEOUS ART THROMBECTOMY/INFUSION INTRACRANIAL INC DIAG ANGIO  01/07/2020  . IR US GUIDE VASC ACCESS RIGHT  01/07/2020  . KIDNEY TRANSPLANT     june 2011-[facility]  . RADIOLOGY WITH ANESTHESIA N/A 01/07/2020    Procedure: IR WITH ANESTHESIA;  Surgeon: Radiologist, Medication, MD;  Location: [facility];  Service: Radiology;  Laterality: N/A;  . TONSILLECTOMY    . TOTAL SHOULDER ARTHROPLASTY Right 06/18/2015   Procedure: RIGHT TOTAL SHOULDER ARTHROPLASTY;  Surgeon: Marchia Bond, MD;  Location: [facility];  Service: Orthopedics;  Laterality: Right;   Family History  Problem Relation Age of Onset  . Dementia Mother   . Cancer Father    Social History   Socioeconomic History  . Marital status: Single    Spouse name: Not on file  . Number of children: Not on file  . Years of education: Not on file  . Highest education level: Not on file  Occupational History  . Occupation: retired  Tobacco Use  . Smoking status: Never Smoker  . Smokeless tobacco: Never Used  Vaping Use  . Vaping Use: Never used  Substance and Sexual Activity  . Alcohol use: No  . Drug use: No  . Sexual activity: Not Currently  Other Topics Concern  . Not on file  Social History Narrative  . Not on file   Social Determinants of Health   Financial Resource Strain: Low Risk   . Difficulty of Paying Living Expenses: Not hard at all  Food Insecurity: No Food Insecurity  . Worried About Charity fundraiser in the Last Year: Never true  . Ran Out of Food in the Last Year: Never true  Transportation Needs: No Transportation Needs  . Lack of Transportation (Medical): No  . Lack of Transportation (Non-Medical): No  Physical Activity: Inactive  . Days of Exercise per Week: 0 days  . Minutes of Exercise per Session: 0 min  Stress: No Stress Concern Present  . Feeling of Stress : Not at all  Social Connections: Not on file    Tobacco Counseling Counseling given: Not Answered   Clinical Intake:  Pre-visit preparation completed: Yes  Pain : No/denies pain     Nutritional Status: BMI > 30  Obese Nutritional Risks: Nausea/ vomitting/ diarrhea (diarrhea depending on meals) Diabetes: No  How often do you need to have  someone help you when you read instructions, pamphlets, or other written materials from your doctor or pharmacy?: 1 - Never What is the last grade level you completed in school?: 12th grade  Diabetic? no  Interpreter Needed?: No  Information entered by :: NAllen LPN   Activities of Daily Living In your present state of health, do you have any difficulty performing the following activities: 12/04/2020 01/07/2020  Hearing? N -  Vision? N -  Difficulty concentrating or making decisions? Y -  Comment due to stroke -  Walking or climbing stairs? N -  Dressing or bathing? N -  Doing errands, shopping? N N  Preparing Food and eating ? N -  Using the Toilet? N -  In the past six months, have you accidently leaked urine? N -  Do you have problems with loss of bowel control? N -  Managing your Medications? N -  Managing your Finances? N -  Housekeeping or managing your Housekeeping? N -  Some recent data might be hidden    Patient Care Team: [name], FNP as PCP - General (General Practice)  Indicate any recent Medical Services you may have received from other than [facility] providers in the past year (date may be approximate).     Assessment:   This is a routine wellness examination for [place].  Hearing/Vision screen No exam data present  Dietary issues and exercise activities discussed: Current Exercise Habits: The patient does not participate in regular exercise at present  Goals    .  Patient Stated (pt-stated)      Wants to maintain good health    .  Patient Stated      12/04/2020, wants to weigh 145-150    .  Weight (lb) < 200 lb (90.7 kg) (pt-stated)    .  Weight (lb) < 200 lb (90.7 kg)      11/29/2019, wants to weigh 175-200 pounds      Depression Screen PHQ 2/9 Scores 12/04/2020 02/13/2020 11/29/2019 11/16/2019 12/29/2018 09/29/2018 03/25/2015  PHQ - 2 Score 0 0 0 0 0 0 0  PHQ- 9 Score - - - - - 0 -    Fall Risk Fall Risk  12/04/2020 11/29/2019 11/16/2019 12/29/2018  09/29/2018  Falls in the past year? 0 0 0 0 0  Risk for fall due to : Impaired balance/gait;Impaired mobility;Medication side effect Medication side effect - - Medication side effect  Follow up Falls evaluation completed;Education provided;Falls prevention discussed Falls evaluation completed;Education provided;Falls prevention discussed - - -    FALL RISK PREVENTION PERTAINING TO THE HOME:  Any stairs in or around the home? Yes  If so, are there any without handrails? No  Home free of loose throw rugs in walkways, pet beds, electrical cords, etc? Yes  Adequate lighting in your home to reduce risk of falls? Yes   ASSISTIVE DEVICES UTILIZED TO PREVENT FALLS:  Life alert? No  Use of a cane, walker or w/c? Yes  Grab bars in the bathroom? No  Shower chair or bench in shower? Yes  Elevated toilet seat or a handicapped toilet? Yes   TIMED UP AND GO:  Was the test performed? No .  .   Gait slow and steady with assistive device  Cognitive Function:     6CIT Screen 12/04/2020 04/03/2020 11/29/2019 09/29/2018  What Year? 0 points 0 points 0 points 0 points  What month? 0 points 0 points 0 points 0 points  What time? 0 points 0 points 0 points 0 points  Count back from 20 0 points 0 points 0 points 0 points  Months in reverse 0 points 0 points 0 points 0 points  Repeat phrase 6 points 0 points 4 points 0 points  Total Score 6 0 4 0    Immunizations Immunization History  Administered Date(s) Administered  . Influenza,inj,Quad PF,6+ Mos 06/20/2015  . Influenza-Unspecified 05/27/2020  . PFIZER(Purple Top)SARS-COV-2 Vaccination 10/13/2019, 11/07/2019, 05/18/2020    TDAP status: Up to date  Flu Vaccine status: Up to date  Pneumococcal vaccine status: sent to pharmacy  Covid-19 vaccine status: Completed vaccines  Qualifies for Shingles Vaccine? Yes   Zostavax completed No   Shingrix Completed?: No.    Education has been provided regarding the importance of this vaccine. Patient  has been advised to call insurance  company to determine out of pocket expense if they have not yet received this vaccine. Advised may also receive vaccine at local pharmacy or Health Dept. Verbalized acceptance and understanding.  Screening Tests Health Maintenance  Topic Date Due  . PNA vac Low Risk Adult (1 of 2 - PCV13) Never done  . COVID-19 Vaccine (4 - Booster for Pfizer series) 11/16/2020  . MAMMOGRAM  01/31/2021  . INFLUENZA VACCINE  03/17/2021  . COLONOSCOPY (Pts 45-18yrs Insurance coverage will need to be confirmed)  09/09/2021  . TETANUS/TDAP  06/04/2024  . DEXA SCAN  Completed  . Hepatitis C Screening  Completed  . HPV VACCINES  Aged Out    Health Maintenance  Health Maintenance Due  Topic Date Due  . PNA vac Low Risk Adult (1 of 2 - PCV13) Never done  . COVID-19 Vaccine (4 - Booster for Pfizer series) 11/16/2020    Colorectal cancer screening: Type of screening: Colonoscopy. Completed 09/10/2011. Repeat every 10 years  Mammogram status: Ordered today. Pt provided with contact info and advised to call to schedule appt.   Bone Density status: Completed 11/01/2017.   Lung Cancer Screening: (Low Dose CT Chest recommended if Age 58-80 years, 30 pack-year currently smoking OR have quit w/in 15years.) does not qualify.   Lung Cancer Screening Referral: no  Additional Screening:  Hepatitis C Screening: does qualify; Completed 09/29/2018   Vision Screening: Recommended annual ophthalmology exams for early detection of glaucoma and other disorders of the eye. Is the patient up to date with their annual eye exam?  Yes  Who is the provider or what is the name of the office in which the patient attends annual eye exams? Does not remember name If pt is not established with a provider, would they like to be referred to a provider to establish care? No .   Dental Screening: Recommended annual dental exams for proper oral hygiene  Community Resource Referral / Chronic Care  Management: CRR required this visit?  No   CCM required this visit?  No      Plan:     I have personally reviewed and noted the following in the patient's chart:   . Medical and social history . Use of alcohol, tobacco or illicit drugs  . Current medications and supplements . Functional ability and status . Nutritional status . Physical activity . Advanced directives . List of other physicians . Hospitalizations, surgeries, and ER visits in previous 12 months . Vitals . Screenings to include cognitive, depression, and falls . Referrals and appointments  In addition, I have reviewed and discussed with patient certain preventive protocols, quality metrics, and best practice recommendations. A written personalized care plan for preventive services as well as general preventive health recommendations were provided to patient.     Kellie Simmering, LPN   [id number]/[id number]   Nurse Notes:

## 2020-12-04 NOTE — Telephone Encounter (Signed)
Pt LVM stating she needs to speak with someone regarding 2 medications on her list. Called pt back unable to LVM

## 2020-12-04 NOTE — Patient Instructions (Addendum)

## 2020-12-04 NOTE — Addendum Note (Signed)
Addended by: Kellie Simmering on: 12/04/2020 10:36 AM   Modules accepted: Orders

## 2020-12-05 LAB — CMP14+EGFR
ALT: 12 IU/L (ref 0–32)
AST: 26 IU/L (ref 0–40)
Albumin/Globulin Ratio: 1.5 (ref 1.2–2.2)
Albumin: 4.8 g/dL — ABNORMAL HIGH (ref 3.7–4.7)
Alkaline Phosphatase: 118 IU/L (ref 44–121)
BUN/Creatinine Ratio: 12 (ref 12–28)
BUN: 11 mg/dL (ref 8–27)
Bilirubin Total: 1.1 mg/dL (ref 0.0–1.2)
CO2: 24 mmol/L (ref 20–29)
Calcium: 10.4 mg/dL — ABNORMAL HIGH (ref 8.7–10.3)
Chloride: 101 mmol/L (ref 96–106)
Creatinine, Ser: 0.9 mg/dL (ref 0.57–1.00)
Globulin, Total: 3.2 g/dL (ref 1.5–4.5)
Glucose: 99 mg/dL (ref 65–99)
Potassium: 3.6 mmol/L (ref 3.5–5.2)
Sodium: 142 mmol/L (ref 134–144)
Total Protein: 8 g/dL (ref 6.0–8.5)
eGFR: 68 mL/min/{1.73_m2} (ref >59)

## 2020-12-05 LAB — LIPID PANEL
Chol/HDL Ratio: 2.1 ratio (ref 0.0–4.4)
Cholesterol, Total: 171 mg/dL (ref 100–199)
HDL: 80 mg/dL (ref >39)
LDL Chol Calc (NIH): 82 mg/dL (ref 0–99)
Triglycerides: 45 mg/dL (ref 0–149)
VLDL Cholesterol Cal: 9 mg/dL (ref 5–40)

## 2020-12-05 LAB — HEMOGLOBIN A1C
Est. average glucose Bld gHb Est-mCnc: 128 mg/dL
Hgb A1c MFr Bld: 6.1 % — ABNORMAL HIGH (ref 4.8–5.6)

## 2020-12-05 LAB — URINE CULTURE

## 2020-12-05 LAB — VITAMIN B12: Vitamin B-12: 554 pg/mL (ref 232–1245)

## 2020-12-13 NOTE — Progress Notes (Signed)
Unfortunately, [facility] is no longer contracted. We can get her set up with [facility] to call her if interested

## 2020-12-16 ENCOUNTER — Ambulatory Visit: Payer: Medicare Other | Admitting: Adult Health

## 2020-12-16 ENCOUNTER — Encounter: Payer: Self-pay | Admitting: Adult Health

## 2020-12-16 VITALS — BP 137/82 | HR 76 | Ht 65.0 in | Wt 201.0 lb

## 2020-12-16 DIAGNOSIS — I482 Chronic atrial fibrillation, unspecified: Secondary | ICD-10-CM

## 2020-12-16 DIAGNOSIS — G4733 Obstructive sleep apnea (adult) (pediatric): Secondary | ICD-10-CM | POA: Diagnosis not present

## 2020-12-16 DIAGNOSIS — Z8673 Personal history of transient ischemic attack (TIA), and cerebral infarction without residual deficits: Secondary | ICD-10-CM

## 2020-12-16 NOTE — Patient Instructions (Addendum)
Highly encourage doing memory exercises, ensuring routine activity, healthy diet and sleep will greatly benefit you. If you are interested in doing cognitive therapy in the future, please let me know  Continue Eliquis (apixaban) daily  and atorvastatin  for secondary stroke prevention  Continue to follow up with PCP regarding cholesterol, blood pressure and pre-diabetes management  Maintain strict control of hypertension with blood pressure goal below 130/90, pre-diabetes with hemoglobin A1c goal below 7% and cholesterol with LDL cholesterol (bad cholesterol) goal below 70 mg/dL.   Contact [facility] to start CPAP therapy for sleep apnea - [phone] - if prompted to leave a voicemail, please do so     Followup in the future with me in 6 months or call earlier if needed       Thank you for coming to see Sarah Gilbert at [facility]. I hope we have been able to provide you high quality care today.  You may receive a patient satisfaction survey over the next few weeks. We would appreciate your feedback and comments so that we may continue to improve ourselves and the health of our patients.     Mild Neurocognitive Disorder Mild neurocognitive disorder, formerly known as mild cognitive impairment, is a disorder in which memory does not work as well as it should. This disorder may also cause problems with other mental functions, including thought, communication, behavior, and completion of tasks. These problems can be noticed and measured, but they usually do not interfere with daily activities or the ability to live independently. Mild neurocognitive disorder typically develops after 73 years of age, but it can also develop at younger ages. It is not as serious as major neurocognitive disorder, also known as dementia, but it may be the first sign of it. Generally, symptoms of this condition get worse over time. In rare cases, symptoms can get better. What are the causes? This  condition may be caused by:  Brain disorders like Alzheimer's disease, Parkinson's disease, and other conditions that gradually damage nerve cells (neurodegenerative conditions).  Diseases that affect blood vessels in the brain and result in small strokes.  Certain infections, such as HIV.  Traumatic brain injury.  Other medical conditions, such as brain tumors, underactive thyroid (hypothyroidism), and vitamin B12 deficiency.  Use of certain drugs or prescription medicines. What increases the risk? The following factors may make you more likely to develop this condition:  Being older than 65 years.  Being female.  Low education level.  Diabetes, high blood pressure, high cholesterol, and other conditions that increase the risk for blood vessel diseases.  Untreated or undertreated sleep apnea.  Having a certain type of gene that can be passed from parent to child (inherited).  Chronic health problems such as heart disease, lung disease, liver disease, kidney disease, or depression. What are the signs or symptoms? Symptoms of this condition include:  Difficulty remembering. You may: ? Forget names, phone numbers, or details of recent events. ? Forget social events and appointments. ? Repeatedly forget where you put your car keys or other items.  Difficulty thinking and solving problems. You may have trouble with complex tasks, such as: ? Paying bills. ? Driving in unfamiliar places.  Difficulty communicating. You may have trouble: ? Finding the right word or naming an object. ? Forming a sentence that makes sense, or understanding what you read or hear.  Changes in your behavior or personality. When this happens, you may: ? Lose interest in the things that you used to enjoy. ?  Withdraw from social situations. ? Get angry more easily than usual. ? Act before thinking. How is this diagnosed? This condition is diagnosed based on:  Your symptoms. Your health care  provider may ask you and the people you spend time with, such as family and friends, about your symptoms.  Evaluation of mental functions (neuropsychological testing). Your health care provider may refer you to a neurologist or mental health specialist to evaluate your mental functions in detail. To identify the cause of your condition, your health care provider may:  Get a detailed medical history.  Ask about use of alcohol, drugs, and prescription medicines.  Do a physical exam.  Order blood tests and brain imaging exams. How is this treated? Mild neurocognitive disorder that is caused by medicine use, drug use, infection, or another medical condition may improve when the cause is treated, or when medicines or drugs are stopped. If this disorder has another cause, it generally does not improve and may get worse. In these cases, the goal of treatment is to help you manage the loss of mental function. Treatments in these cases include:  Medicine. Medicine mainly helps memory and behavior symptoms.  Talk therapy. Talk therapy provides education, emotional support, memory aids, and other ways of making up for problems with mental function.  Lifestyle changes, including: ? Getting regular exercise. ? Eating a healthy diet that includes omega-3 fatty acids. ? Challenging your thinking and memory skills. ? Having more social interaction. Follow these instructions at home: Eating and drinking  Drink enough fluid to keep your urine pale yellow.  Eat a healthy diet that includes omega-3 fatty acids. These can be found in: ? Fish. ? Nuts. ? Leafy vegetables. ? Vegetable oils.  If you drink alcohol: ? Limit how much you use to:  0-1 drink a day for women.  0-2 drinks a day for men. ? Be aware of how much alcohol is in your drink. In the [place] one drink equals one 12 oz bottle of beer (355 mL), one 5 oz glass of wine (148 mL), or one 1 oz glass of hard liquor (44 mL).    Lifestyle  Get regular exercise as told by your health care provider.  Do not use any products that contain nicotine or tobacco, such as cigarettes, e-cigarettes, and chewing tobacco. If you need help quitting, ask your health care provider.  Practice ways to manage stress. If you need help managing stress, ask your health care provider.  Continue to have social interaction.  Keep your mind active with stimulating activities you enjoy, such as reading or playing games.  Make sure to get quality sleep. Follow these tips: ? Avoid napping during the day. ? Keep your sleeping area dark and cool. ? Avoid exercising during the few hours before you go to bed. ? Avoid caffeine products in the evening.   General instructions  Take over-the-counter and prescription medicines only as told by your health care provider. Your health care provider may recommend that you avoid taking medicines that can affect thinking, such as pain medicines or sleep medicines.  Work with your health care provider to find out what you need help with and what your safety needs are.  Keep all follow-up visits. This is important. Where to find more information  [employer] on Aging: http://kim-miller.com/ Contact a health care provider if:  You have any new symptoms. Get help right away if:  You develop new confusion or your confusion gets worse.  You act in  ways that place you or your family in danger. Summary  Mild neurocognitive disorder is a disorder in which memory does not work as well as it should.  Mild neurocognitive disorder can have many causes. It may be the first stage of dementia.  To manage your condition, get regular exercise, keep your mind active, get quality sleep, and eat a healthy diet. This information is not intended to replace advice given to you by your health care provider. Make sure you discuss any questions you have with your health care provider. Document Revised: 12/18/2019  Document Reviewed: 12/18/2019 Elsevier Patient Education  [place].

## 2020-12-16 NOTE — Progress Notes (Signed)
I agree with the above plan 

## 2020-12-16 NOTE — Progress Notes (Signed)
[facility] [address] [place]. [place] [address] [phone]       STROKE FOLLOW UP NOTE  [name] Date of Birth:  06-15-1946 Medical Record Number:  [id number]   Reason for Referral: stroke follow up    SUBJECTIVE:   CHIEF COMPLAINT:  Chief Complaint  Patient presents with  . Follow-up    Rm 14 with uncle Sarah Gilbert) Pt is well and stable on stroke stand point, thinks memory could be better. Some fatigue     HPI:   Today, 12/16/2020, Sarah Gilbert returns for 73-month stroke follow-up accompanied by her uncle, Sarah Gilbert from stroke standpoint without new stroke/TIA symptoms Reports continued short-term memory without worsening but denies improvement.  She does not do any memory exercises nor routine physical activity or exercise.  She is able to maintain ADLs independently and majority of IADLs.  She continues to live with her uncle who assists as needed Compliant on Eliquis and atorvastatin without associated side effects Blood pressure today 137/82 - monitors at home and typically lower 110-120/80s   She wished to initiate CPAP at prior follow-up visit which was initiated by Dr. Tori Milks nurse Cyril Mourning Dinkins, RN. Per epic review, aero care attempted to reach him multiple times in February but per pt and uncle, they did not receive any calls to start.   No further concerns at this time     History provided for reference purposes only Update 06/17/2020 JM: Sarah Gilbert returns for stroke follow-up accompanied by her uncle.  C/o short term memory difficulty since her stroke such as misplacing items as well as difficulty with comprehension or remembering what is being told her.  This has been stable without worsening.  She is able to maintain her ADLs and majority of IADLs independently. Hx of mild short-term memory impairment prior to her stroke.  She was evaluated by speech therapy at home and discharged as SLP not indicated per patient.   Denies new or worsening stroke/TIA symptoms.  She remains on Eliquis and atorvastatin for secondary stroke prevention without side effects.  Blood pressure today 136/72.  Evaluated by Dr. Rexene Alberts for possible sleep apnea undergoing baseline sleep study on 03/31/2020 which did show evidence of mild sleep apnea and recommended initiating AutoPap therapy.  She was should further speak with her cardiologist and nephrologist prior to initiating who recommended treatment for sleep apnea and is now ready to proceed with AutoPap therapy.  No further concerns at this time.  Initial visit 02/13/2020 JM: Sarah Gilbert is being seen for hospital follow-up accompanied by her uncle.  She has been doing well since discharge without residual stroke deficits.  Participated in [facility] PT[facility] which has since been completed.  She does report increased fatigue since her stroke.  Sleeps well at night.  Sleeps alone but denies snoring or witnessed apneas.  She has not previously underwent sleep study to rule out sleep apnea.  Continues on Eliquis 5 mg twice daily without bleeding or bruising.  Continues to follow with cardiologist Dr. Terrence Dupont routinely.  Continues on atorvastatin 40 mg daily without myalgias.  Blood pressure today 143/80.  No concerns at this time.  Stroke admission 01/07/2020 Sarah Gilbert a 75 y.o.femalewith history of double kidney transplant (2011), HTN, . A fib (on xarelto), HLDwho presented on 01/07/2020 with L isded weakness, R gaze, L facial and slurred speech. Stroke work-up revealed several small right MCA infarcts w/ right ICA terminus thrombus s/p TICI 3 revascularization, infarcts embolic secondary to  known AF off AC.  MRA head/neck unremarkable post IR.  Previously on Xarelto but per report, missed 3 to 4 days due to running out of prescription and transition to Eliquis 5 mg twice daily for secondary stroke prevention and history of atrial fibrillation.  History of HTN and resumed home amlodipine,  Lasix and metoprolol.  History of HLD with LDL 74 and increased home dose atorvastatin from 20 mg to 40 mg daily.  Other stroke risk factors include advanced age and obesity but no prior stroke history.  Other active problems include ESRD s/p bilateral kidney transplant on Prograf and CellCept (not on HD), thyroid disease and hypokalemia.  Evaluated by therapies and discharged home with home health PT/OT.  Stroke:Severeal small R MCAinfarctsembolic secondary to known AF off AC  Code Stroke CT headhyperdense R supraclinoid ICA.ASPECTS 10.  Cerebral angioR ICA terminus thrombus s/p TICI3 revascularization w/ 2 passes([place])  Post IR CTno hemorrhage, no statin  MRISeveral small scattered R hemisphere infarcts  MRA headmild intracranial atherosclerosis   MRA neckUnremarkable  2D EchoEF60-65%. No source of embolus. LA dilated  LDL74 -increase atorvastatin from 20 mg to 40 mg daily  HgbA1c6.1  Xarelto (rivaroxaban) dailyprior to admission but had missed 3-4 days, now onEliquis 5 mg bid.   Therapy recommendations:HH PT, HH OT - [facility]  Disposition:return home      ROS:   14 system review of systems performed and negative with exception of short-term memory loss  PMH:  Past Medical History:  Diagnosis Date  . Anemia   . Arthritis   . Atrial fibrillation ([place])   . Bleeding nose    history of   . ESRD (end stage renal disease) ([place])   . History of bronchitis   . Hyperlipemia   . Hypertension   . Joint inflammation   . Obesity   . PONV (postoperative nausea and vomiting)   . Rotator cuff tear arthropathy of right shoulder 06/18/2015  . Thyroid disease   . Wears glasses     PSH:  Past Surgical History:  Procedure Laterality Date  . ABDOMINAL HYSTERECTOMY    . APPENDECTOMY    . CARDIAC CATHETERIZATION N/A 04/18/2015   Procedure: Left Heart Cath and Coronary Angiography;  Surgeon: Charolette Forward, MD;  Location: [facility];  Service:  Cardiovascular;  Laterality: N/A;  . CHOLECYSTECTOMY    . COLONOSCOPY    . IR CT HEAD LTD  01/07/2020  . IR PERCUTANEOUS ART THROMBECTOMY/INFUSION INTRACRANIAL INC DIAG ANGIO  01/07/2020  . IR US GUIDE VASC ACCESS RIGHT  01/07/2020  . KIDNEY TRANSPLANT     june 2011-[facility]  . RADIOLOGY WITH ANESTHESIA N/A 01/07/2020   Procedure: IR WITH ANESTHESIA;  Surgeon: Radiologist, Medication, MD;  Location: [facility];  Service: Radiology;  Laterality: N/A;  . TONSILLECTOMY    . TOTAL SHOULDER ARTHROPLASTY Right 06/18/2015   Procedure: RIGHT TOTAL SHOULDER ARTHROPLASTY;  Surgeon: Marchia Bond, MD;  Location: [facility];  Service: Orthopedics;  Laterality: Right;    Social History:  Social History   Socioeconomic History  . Marital status: Single    Spouse name: Not on file  . Number of children: Not on file  . Years of education: Not on file  . Highest education level: Not on file  Occupational History  . Occupation: retired  Tobacco Use  . Smoking status: Never Smoker  . Smokeless tobacco: Never Used  Vaping Use  . Vaping Use: Never used  Substance and Sexual Activity  . Alcohol use:  No  . Drug use: No  . Sexual activity: Not Currently  Other Topics Concern  . Not on file  Social History Narrative  . Not on file   Social Determinants of Health   Financial Resource Strain: Low Risk   . Difficulty of Paying Living Expenses: Not hard at all  Food Insecurity: No Food Insecurity  . Worried About Charity fundraiser in the Last Year: Never true  . Ran Out of Food in the Last Year: Never true  Transportation Needs: No Transportation Needs  . Lack of Transportation (Medical): No  . Lack of Transportation (Non-Medical): No  Physical Activity: Inactive  . Days of Exercise per Week: 0 days  . Minutes of Exercise per Session: 0 min  Stress: No Stress Concern Present  . Feeling of Stress : Not at all  Social Connections: Not on file  Intimate Partner Violence: Not on file    Family History:   Family History  Problem Relation Age of Onset  . Dementia Mother   . Cancer Father     Medications:   Current Outpatient Medications on File Prior to Visit  Medication Sig Dispense Refill  . amLODipine (NORVASC) 10 MG tablet Take 5 mg by mouth daily.     Marland Kitchen apixaban (ELIQUIS) 5 MG TABS tablet Take 1 tablet (5 mg total) by mouth 2 (two) times daily. 60 tablet 2  . atorvastatin (LIPITOR) 40 MG tablet Take 1 tablet (40 mg total) by mouth daily. 30 tablet 2  . b complex-vitamin c-folic acid (NEPHRO-VITE) 0.8 MG TABS Take 0.8 mg by mouth at bedtime.    . furosemide (LASIX) 40 MG tablet Take 40 mg by mouth daily.    . magnesium gluconate (MAGONATE) 500 MG tablet Take 500 mg by mouth 2 (two) times daily.    . metoprolol (LOPRESSOR) 50 MG tablet Take 50 mg by mouth 2 (two) times daily.    . mycophenolate (CELLCEPT) 250 MG capsule Take 500 mg by mouth 2 (two) times daily.    . pantoprazole (PROTONIX) 40 MG tablet Take 40 mg by mouth daily.     . potassium chloride SA (K-DUR,KLOR-CON) 20 MEQ tablet Take 20 mEq by mouth daily.     . tacrolimus (PROGRAF) 0.5 MG capsule Take 0.5 mg by mouth 2 (two) times daily. Take one capsule along with the 1mg  capsule    . tacrolimus (PROGRAF) 1 MG capsule Take 2 mg by mouth 2 (two) times daily.     . Vitamin D, Ergocalciferol, (DRISDOL) 1.25 MG (50000 UNIT) CAPS capsule TAKE 1 CAPSULE BY MOUTH 2 TIMES A WEEK 12 capsule 2   No current facility-administered medications on file prior to visit.    Allergies:   Allergies  Allergen Reactions  . Vancomycin Hives, Itching, Rash and Other (See Comments)    Blistering, swelling, and peeling of feet.  Pt denies any similar reactions around face or neck.  . Iohexol      Desc: iodine contrast/per dialysis center, Onset Date: [id number]       OBJECTIVE:  Physical Exam  Vitals:   12/16/20 0835  BP: 137/82  Pulse: 76  Weight: 201 lb (91.2 kg)  Height: 5\' 5"  (1.651 m)   Body mass index is 33.45 kg/m. No exam  data present  General: well developed, well nourished, pleasant elderly African-American female, seated, in no evident distress Head: head normocephalic and atraumatic.   Neck: supple with no carotid or supraclavicular bruits Cardiovascular: irregular rate and rhythm, no  murmurs Musculoskeletal: no deformity Skin:  no rash/petichiae Vascular:  Normal pulses all extremities   Neurologic Exam Mental Status: Awake and fully alert.  Fluent speech and language. Oriented to place and time. Recent memory subjectively impaired and remote memory intact. Attention span and concentration impaired with repeating questions and needing repeat clarification and fund of knowledge appropriate. Mood and affect appropriate. Recall 1/3 (3/3 with prompt). Serial addition good. 4 legged animal 9 in 60 seconds. Clock drawing 4/4 Cranial Nerves: Pupils equal, briskly reactive to light. Extraocular movements full without nystagmus. Visual fields full to confrontation. Hearing intact. Facial sensation intact. Face, tongue, palate moves normally and symmetrically.  Motor: Normal bulk and tone. Normal strength in all tested extremity muscles. Sensory.: intact to touch , pinprick , position and vibratory sensation.  Coordination: Rapid alternating movements normal in all extremities except slightly decreased left hand. Finger-to-nose and heel-to-shin performed accurately bilaterally. Mild action tremor right hand (chronic) Gait and Station: Arises from chair without difficulty. Stance is normal. Gait demonstrates normal stride length and balance with use of cane Reflexes: 1+ and symmetric. Toes downgoing.        ASSESSMENT/PLAN: Tailor Lucking is a 75 y.o. year old female presented with left-sided weakness, right gaze preference, left facial droop and slurred speech on 01/07/2020 with stroke work-up revealing several small right MCA infarcts w/ right ICA terminus thrombus s/p TICI 3 revascularization, infarct embolic  secondary to known AF off AC (missed 3-4 doses of Xarelto).  Transitioned to Eliquis for secondary stroke prevention.  Vascular risk factors include HTN, HLD, A. Fib, obesity and recent diagnosis of mild OSA.      1. Right MCA infarcts:  a. Residual deficits: Short-term memory complaints with baseline memory impairment prior to stroke likely mild cognitive impairment.  Discussed compensation strategies as well as importance of memory exercises such as crossword puzzles, word search, sudoku, card games and reading as well as managing stroke risk factors, routine physical activity, healthy diet and adequate sleep.  Also discussed importance of initiating CPAP for sleep apnea b. Continue Eliquis (apixaban) daily  and atorvastatin 40 mg daily for secondary stroke prevention. c. Discussed secondary stroke prevention and importance of close PCP follow-up for management of stroke risk factors for secondary stroke prevention including HTN with BP goal<130/90 and HLD with LDL goal<40  2. Atrial fibrillation: a. On Eliquis 5 mg twice daily for CHA2DS2-VASc score 5 b. Followed by PCP for monitoring and management  3. Obstructive sleep apnea a. Baseline sleep study 03/31/2020 by Dr. Rexene Alberts which showed mild OSA b. Provided contact information for aero care to initiate CPAP     Follow up in 6 months or call earlier if needed   CC:  [facility] provider: Dr. Baron Sane, Doreene Burke, [place], AGNP-BC  [facility] [address] [place], [place] [address]  Phone [phone] Fax [phone] Note: This document was prepared with digital dictation and possible smart phrase technology. Any transcriptional errors that result from this process are unintentional.

## 2021-01-15 ENCOUNTER — Telehealth: Payer: Self-pay

## 2021-01-15 NOTE — Telephone Encounter (Signed)
-----   Message from [name], [place] sent at 01/08/2021  5:06 PM EDT ----- I really don't know what she is talking about you will likely have to contact the patient.    ----- Message ----- From: Michelle Nasuti, [place]: 12/26/2020   2:28 PM EDT To: [name], FNP  The lady from [place] [facility] left a message that she wanted he information to give to the patient.   That's what she left on the voicemail. Could she be talking about [facility]? ----- Message ----- From: [name], FNP Sent: 12/26/2020   2:26 PM EDT To: Michelle Nasuti, CMA  I am not sure what you are asking?   ----- Message ----- From: Michelle Nasuti, [place] Sent: 12/24/2020   3:55 PM EDT To: [name], FNP  Linward Natal with [employer] said that she called to get the information that the pt was told that she would get at her last appointment that could help her with her medical management so that she can get the information tot he patient. [phone].

## 2021-01-16 ENCOUNTER — Other Ambulatory Visit: Payer: Self-pay

## 2021-01-16 DIAGNOSIS — I10 Essential (primary) hypertension: Secondary | ICD-10-CM

## 2021-01-16 DIAGNOSIS — I739 Peripheral vascular disease, unspecified: Secondary | ICD-10-CM

## 2021-01-16 DIAGNOSIS — I639 Cerebral infarction, unspecified: Secondary | ICD-10-CM

## 2021-02-05 ENCOUNTER — Telehealth: Payer: Self-pay | Admitting: *Deleted

## 2021-02-05 NOTE — Chronic Care Management (AMB) (Signed)
  Chronic Care Management   Note  02/05/2021 Name: Sarah Gilbert MRN: [id number] DOB: 11/07/1945  Sarah Gilbert is a 75 y.o. year old female who is a primary care patient of [name], [place]. I reached out to Sarah Gilbert by phone today in response to a referral sent by Sarah Gilbert PCP, [name], FNP.      Sarah Gilbert was given information about Chronic Care Management services today including:  CCM service includes personalized support from designated clinical staff supervised by her physician, including individualized plan of care and coordination with other care providers 24/7 contact phone numbers for assistance for urgent and routine care needs. Service will only be billed when office clinical staff spend 20 minutes or more in a month to coordinate care. Only one practitioner may furnish and bill the service in a calendar month. The patient may stop CCM services at any time (effective at the end of the month) by phone call to the office staff. The patient will be responsible for cost sharing (co-pay) of up to 20% of the service fee (after annual deductible is met).  Patient agreed to services and verbal consent obtained.   Follow up plan: Telephone appointment with care management team member scheduled for:02/24/2021  [place] Management

## 2021-02-21 ENCOUNTER — Telehealth: Payer: Self-pay | Admitting: *Deleted

## 2021-02-21 NOTE — Chronic Care Management (AMB) (Signed)
  Care Management   Note  02/21/2021 Name: Sarah Gilbert MRN: [id number] DOB: 08/28/1945  Sarah Gilbert is a 75 y.o. year old female who is a primary care patient of [name], [place] and is actively engaged with the care management team. I reached out to Dionne Bucy by phone today to assist with re-scheduling an initial visit with the BSW  Follow up plan: A telephone outreach attempt made to reschedule call to in person appointment. Will await for Daneen Schick BSW to advise on scheduling in person visit.  [place] Management

## 2021-02-24 ENCOUNTER — Telehealth: Payer: Medicare Other

## 2021-02-24 ENCOUNTER — Ambulatory Visit: Payer: Self-pay

## 2021-02-24 DIAGNOSIS — I739 Peripheral vascular disease, unspecified: Secondary | ICD-10-CM

## 2021-02-24 DIAGNOSIS — I10 Essential (primary) hypertension: Secondary | ICD-10-CM

## 2021-02-24 DIAGNOSIS — I639 Cerebral infarction, unspecified: Secondary | ICD-10-CM

## 2021-02-24 NOTE — Chronic Care Management (AMB) (Signed)
  Care Management   Follow Up Note   02/24/2021 Name: Sarah Gilbert MRN: [id number] DOB: [birth year]   Referred by: [name], FNP Reason for referral : Chronic Care Management   Successful outbound call placed to the patient to discuss care management program. Patient indicates she would like to meet with SW face to face to discuss enrollment so her uncle can be present. Advised the patient SW is remote and unable to meet at this time face to face but is happy to participate in a joint call with the patient and her uncle to discuss what the care management program is - patient declined. SW attempted to link the patient with pharmacist for a face to face visit - patient declined.  Patient requests SW contact her in the future if SW is ever in the office due to "not needing any help right now". SW advised the patient that this Probation officer assists with resources such as transportation, food, benefit education, and caregiver resources. SW further advised the role of Consulting civil engineer and what she assists patients with. Discussed that this is a primarily telephonic case management program to assist with care coordination needs in between physician visits.  Patient states she is happy to engage by phone in between visits but states she is doing well and is not in need of any help. Patient requests the option to not work with CCM unless she needs something in which case she will contact care guide Laverda Sorenson to request a visit. SW advised the patient that due to assessment timeline needs, SW will close current referral with option to work with patient in the future if needed. Patient agreed.  Follow Up Plan:  Current referral will be closed at patients request.  Daneen Schick, BSW, CDP Social Worker, Certified Dementia Practitioner [place] [facility] [place] [facility] [phone]

## 2021-03-18 ENCOUNTER — Ambulatory Visit: Payer: Medicare Other | Admitting: Podiatry

## 2021-04-07 ENCOUNTER — Ambulatory Visit: Payer: Medicare Other | Admitting: Podiatry

## 2021-04-08 ENCOUNTER — Other Ambulatory Visit: Payer: Self-pay

## 2021-04-08 ENCOUNTER — Encounter: Payer: Self-pay | Admitting: Nurse Practitioner

## 2021-04-08 ENCOUNTER — Ambulatory Visit (INDEPENDENT_AMBULATORY_CARE_PROVIDER_SITE_OTHER): Payer: Medicare Other | Admitting: Nurse Practitioner

## 2021-04-08 VITALS — BP 124/80 | HR 69 | Temp 98.1°F | Ht 65.0 in | Wt 191.4 lb

## 2021-04-08 DIAGNOSIS — E782 Mixed hyperlipidemia: Secondary | ICD-10-CM

## 2021-04-08 DIAGNOSIS — I10 Essential (primary) hypertension: Secondary | ICD-10-CM | POA: Diagnosis not present

## 2021-04-08 DIAGNOSIS — Z9842 Cataract extraction status, left eye: Secondary | ICD-10-CM

## 2021-04-08 DIAGNOSIS — R7309 Other abnormal glucose: Secondary | ICD-10-CM

## 2021-04-08 DIAGNOSIS — I739 Peripheral vascular disease, unspecified: Secondary | ICD-10-CM

## 2021-04-08 DIAGNOSIS — E6609 Other obesity due to excess calories: Secondary | ICD-10-CM

## 2021-04-08 DIAGNOSIS — Z6831 Body mass index (BMI) 31.0-31.9, adult: Secondary | ICD-10-CM

## 2021-04-08 DIAGNOSIS — E559 Vitamin D deficiency, unspecified: Secondary | ICD-10-CM

## 2021-04-08 DIAGNOSIS — Z23 Encounter for immunization: Secondary | ICD-10-CM

## 2021-04-08 MED ORDER — PREVNAR 20 0.5 ML IM SUSY: 0.5000 mL | PREFILLED_SYRINGE | INTRAMUSCULAR | 0 refills | Status: AC

## 2021-04-08 MED ORDER — SHINGRIX 50 MCG/0.5ML IM SUSR: 0.5000 mL | Freq: Once | INTRAMUSCULAR | 0 refills | Status: AC

## 2021-04-08 NOTE — Patient Instructions (Addendum)
Diabetes Care, 44(Suppl 1), S34-S39. https://doi.org/https://doi.org/10.2337/[email or web address]-[email or web address]">  Prediabetes Prediabetes is when your blood sugar (blood glucose) level is higher than normal but not high enough for you to be diagnosed with type 2 diabetes. Having prediabetes puts you at risk for developing type 2 diabetes (type 2 diabetes mellitus). With certain lifestyle changes, you may be able to prevent or delay the onset of type 2 diabetes. This is important because type 2 diabetes can lead to serious complications, such as: Heart disease. Stroke. Blindness. Kidney disease. Depression. Poor circulation in the feet and legs. In severe cases, this could lead to surgical removal of a leg (amputation). What are the causes? The exact cause of prediabetes is not known. It may result from insulin resistance. Insulin resistance develops when cells in the body do not respond properly to insulin that the body makes. This can cause excess glucose to build up in the blood. High blood glucose (hyperglycemia) can develop. What increases the risk? The following factors may make you more likely to develop this condition: You have a family member with type 2 diabetes. You are older than 45 years. You had a temporary form of diabetes during a pregnancy (gestational diabetes). You had polycystic ovary syndrome (PCOS). You are overweight or obese. You are inactive (sedentary). You have a history of heart disease, including problems with cholesterol levels, high levels of blood fats, or high blood pressure. What are the signs or symptoms? You may have no symptoms. If you do have symptoms, they may include: Increased hunger. Increased thirst. Increased urination. Vision changes, such as blurry vision. Tiredness (fatigue). How is this diagnosed? This condition can be diagnosed with blood tests. Your blood glucose may be checked with one or more of the following tests: A fasting blood glucose (FBG) test. You will  not be allowed to eat (you will fast) for at least 8 hours before a blood sample is taken. An A1C blood test (hemoglobin A1C). This test provides information about blood glucose levels over the previous 2?3 months. An oral glucose tolerance test (OGTT). This test measures your blood glucose at two points in time: After fasting. This is your baseline level. Two hours after you drink a beverage that contains glucose. You may be diagnosed with prediabetes if: Your FBG is 100?125 mg/dL (5.6-6.9 mmol/L). Your A1C level is 5.7?6.4% (39-46 mmol/mol). Your OGTT result is 140?199 mg/dL (7.8-11 mmol/L). These blood tests may be repeated to confirm your diagnosis. How is this treated? Treatment may include dietary and lifestyle changes to help lower your blood glucose and prevent type 2 diabetes from developing. In some cases, medicinemay be prescribed to help lower the risk of type 2 diabetes. Follow these instructions at home: Nutrition  Follow a healthy meal plan. This includes eating lean proteins, whole grains, legumes, fresh fruits and vegetables, low-fat dairy products, and healthy fats. Follow instructions from your health care provider about eating or drinking restrictions. Meet with a dietitian to create a healthy eating plan that is right for you.  Lifestyle Do moderate-intensity exercise for at least 30 minutes a day on 5 or more days each week, or as told by your health care provider. A mix of activities may be best, such as: Brisk walking, swimming, biking, and weight lifting. Lose weight as told by your health care provider. Losing 5-7% of your body weight can reverse insulin resistance. Do not drink alcohol if: Your health care provider tells you not to drink. You are pregnant, may be pregnant, or are planning   to become pregnant. If you drink alcohol: Limit how much you use to: 0-1 drink a day for women. 0-2 drinks a day for men. Be aware of how much alcohol is in your drink. In  the [place] one drink equals one 12 oz bottle of beer (355 mL), one 5 oz glass of wine (148 mL), or one 1 oz glass of hard liquor (44 mL). General instructions Take over-the-counter and prescription medicines only as told by your health care provider. You may be prescribed medicines that help lower the risk of type 2 diabetes. Do not use any products that contain nicotine or tobacco, such as cigarettes, e-cigarettes, and chewing tobacco. If you need help quitting, ask your health care provider. Keep all follow-up visits. This is important. Where to find more information American Diabetes Association: www.diabetes.org Academy of Nutrition and Dietetics: www.eatright.org American Heart Association: www.heart.org Contact a health care provider if: You have any of these symptoms: Increased hunger. Increased urination. Increased thirst. Fatigue. Vision changes, such as blurry vision. Get help right away if you: Have shortness of breath. Feel confused. Vomit or feel like you may vomit. Summary Prediabetes is when your blood sugar (blood glucose)level is higher than normal but not high enough for you to be diagnosed with type 2 diabetes. Having prediabetes puts you at risk for developing type 2 diabetes (type 2 diabetes mellitus). Make lifestyle changes such as eating a healthy diet and exercising regularly to help prevent diabetes. Lose weight as told by your health care provider. This information is not intended to replace advice given to you by your health care provider. Make sure you discuss any questions you have with your healthcare provider. Document Revised: 11/02/2019 Document Reviewed: 11/02/2019 Elsevier Patient Education  2022 [place] to the pharmacy to get your shingrix and pneumonia 20 vaccine.    Diabetes Mellitus and Nutrition, Adult When you have diabetes, or diabetes mellitus, it is very important to have healthy eating habits because your blood sugar (glucose)  levels are greatly affected by what you eat and drink. Eating healthy foods in the right amounts, at about the same times every day, can help you: Control your blood glucose. Lower your risk of heart disease. Improve your blood pressure. Reach or maintain a healthy weight. What can affect my meal plan? Every person with diabetes is different, and each person has different needs for a meal plan. Your health care provider may recommend that you work with a dietitian to make a meal plan that is best for you. Your meal plan may vary depending on factors such as: The calories you need. The medicines you take. Your weight. Your blood glucose, blood pressure, and cholesterol levels. Your activity level. Other health conditions you have, such as heart or kidney disease. How do carbohydrates affect me? Carbohydrates, also called carbs, affect your blood glucose level more than any other type of food. Eating carbs naturally raises the amount of glucose in your blood. Carb counting is a method for keeping track of how many carbs you eat. Counting carbs is important to keep your blood glucose at a healthy level,especially if you use insulin or take certain oral diabetes medicines. It is important to know how many carbs you can safely have in each meal. This is different for every person. Your dietitian can help you calculate how manycarbs you should have at each meal and for each snack. How does alcohol affect me? Alcohol can cause a sudden decrease in blood  glucose (hypoglycemia), especially if you use insulin or take certain oral diabetes medicines. Hypoglycemia can be a life-threatening condition. Symptoms of hypoglycemia, such as sleepiness, dizziness, and confusion, are similar to symptoms of having too much alcohol. Do not drink alcohol if: Your health care provider tells you not to drink. You are pregnant, may be pregnant, or are planning to become pregnant. If you drink alcohol: Do not drink on an  empty stomach. Limit how much you use to: 0-1 drink a day for women. 0-2 drinks a day for men. Be aware of how much alcohol is in your drink. In the [place] one drink equals one 12 oz bottle of beer (355 mL), one 5 oz glass of wine (148 mL), or one 1 oz glass of hard liquor (44 mL). Keep yourself hydrated with water, diet soda, or unsweetened iced tea. Keep in mind that regular soda, juice, and other mixers may contain a lot of sugar and must be counted as carbs. What are tips for following this plan?  Reading food labels Start by checking the serving size on the "Nutrition Facts" label of packaged foods and drinks. The amount of calories, carbs, fats, and other nutrients listed on the label is based on one serving of the item. Many items contain more than one serving per package. Check the total grams (g) of carbs in one serving. You can calculate the number of servings of carbs in one serving by dividing the total carbs by 15. For example, if a food has 30 g of total carbs per serving, it would be equal to 2 servings of carbs. Check the number of grams (g) of saturated fats and trans fats in one serving. Choose foods that have a low amount or none of these fats. Check the number of milligrams (mg) of salt (sodium) in one serving. Most people should limit total sodium intake to less than 2,300 mg per day. Always check the nutrition information of foods labeled as "low-fat" or "nonfat." These foods may be higher in added sugar or refined carbs and should be avoided. Talk to your dietitian to identify your daily goals for nutrients listed on the label. Shopping Avoid buying canned, pre-made, or processed foods. These foods tend to be high in fat, sodium, and added sugar. Shop around the outside edge of the grocery store. This is where you will most often find fresh fruits and vegetables, bulk grains, fresh meats, and fresh dairy. Cooking Use low-heat cooking methods, such as baking, instead of  high-heat cooking methods like deep frying. Cook using healthy oils, such as olive, canola, or sunflower oil. Avoid cooking with butter, cream, or high-fat meats. Meal planning Eat meals and snacks regularly, preferably at the same times every day. Avoid going long periods of time without eating. Eat foods that are high in fiber, such as fresh fruits, vegetables, beans, and whole grains. Talk with your dietitian about how many servings of carbs you can eat at each meal. Eat 4-6 oz (112-168 g) of lean protein each day, such as lean meat, chicken, fish, eggs, or tofu. One ounce (oz) of lean protein is equal to: 1 oz (28 g) of meat, chicken, or fish. 1 egg.  cup (62 g) of tofu. Eat some foods each day that contain healthy fats, such as avocado, nuts, seeds, and fish. What foods should I eat? Fruits Berries. Apples. Oranges. Peaches. Apricots. Plums. Grapes. Mango. Papaya.Pomegranate. Kiwi. Cherries. Vegetables Lettuce. Spinach. Leafy greens, including kale, chard, collard greens, and mustard greens.  Beets. Cauliflower. Cabbage. Broccoli. Carrots. Green beans.Tomatoes. Peppers. Onions. Cucumbers. Brussels sprouts. Grains Whole grains, such as whole-wheat or whole-grain bread, crackers, tortillas,cereal, and pasta. Unsweetened oatmeal. Quinoa. Brown or wild rice. Meats and other proteins Seafood. Poultry without skin. Lean cuts of poultry and beef. Tofu. Nuts. Seeds. Dairy Low-fat or fat-free dairy products such as milk, yogurt, and cheese. The items listed above may not be a complete list of foods and beverages you can eat. Contact a dietitian for more information. What foods should I avoid? Fruits Fruits canned with syrup. Vegetables Canned vegetables. Frozen vegetables with butter or cream sauce. Grains Refined white flour and flour products such as bread, pasta, snack foods, andcereals. Avoid all processed foods. Meats and other proteins Fatty cuts of meat. Poultry with skin. Breaded  or fried meats. Processed meat.Avoid saturated fats. Dairy Full-fat yogurt, cheese, or milk. Beverages Sweetened drinks, such as soda or iced tea. The items listed above may not be a complete list of foods and beverages you should avoid. Contact a dietitian for more information. Questions to ask a health care provider Do I need to meet with a diabetes educator? Do I need to meet with a dietitian? What number can I call if I have questions? When are the best times to check my blood glucose? Where to find more information: American Diabetes Association: diabetes.org Academy of Nutrition and Dietetics: www.eatright.[employer] of Diabetes and Digestive and Kidney Diseases: [employer] Association of Diabetes Care and Education Specialists: www.diabeteseducator.org Summary It is important to have healthy eating habits because your blood sugar (glucose) levels are greatly affected by what you eat and drink. A healthy meal plan will help you control your blood glucose and maintain a healthy lifestyle. Your health care provider may recommend that you work with a dietitian to make a meal plan that is best for you. Keep in mind that carbohydrates (carbs) and alcohol have immediate effects on your blood glucose levels. It is important to count carbs and to use alcohol carefully. This information is not intended to replace advice given to you by your health care provider. Make sure you discuss any questions you have with your healthcare provider. Document Revised: 07/11/2019 Document Reviewed: 07/11/2019 Elsevier Patient Education  2021 [employer].

## 2021-04-08 NOTE — Progress Notes (Signed)
I, ,acting as a Education administrator for Sarah Brine, FNP.,have documented all relevant documentation on the behalf of Sarah Brine, FNP,as directed by  Sarah Brine, FNP while in the presence of Sarah Gilbert, Sarah Gilbert.  This visit occurred during the SARS-CoV-2 public health emergency.  Safety protocols were in place, including screening questions prior to the visit, additional usage of staff PPE, and extensive cleaning of exam room while observing appropriate contact time as indicated for disinfecting solutions.  Subjective:     Patient ID: Sarah Gilbert , female    DOB: 11-10-1945 , 75 y.o.   MRN: [id number]   Chief Complaint  Patient presents with   Diabetes    HPI  Pt presents today for glucose f/u.  She will go to the pharmacy to get her shingrix vaccine. She is interested to have CCM but due to not being able to set up to see the patient in person she has changed her mind, since having her stroke her memory is not as good as it should be. She has seen her Nephrologist since her last visit, no changes.  Wt Readings from Last 3 Encounters: 04/08/21 : 191 lb 6.4 oz (86.8 kg) 12/16/20 : 201 lb (91.2 kg) 12/04/20 : 198 lb (89.8 kg)      Diabetes She presents for her follow-up diabetic visit. She has type 2 diabetes mellitus. There are no hypoglycemic associated symptoms. Pertinent negatives for hypoglycemia include no dizziness or headaches. There are no diabetic associated symptoms. Pertinent negatives for diabetes include no chest pain, no fatigue, no polydipsia, no polyphagia and no polyuria. There are no hypoglycemic complications. There are no diabetic complications. Risk factors for coronary artery disease include obesity and sedentary lifestyle. When asked about current treatments, none were reported. She has not had a previous visit with a dietitian. She rarely participates in exercise. She does not see a podiatrist.Eye exam is not current.    Past Medical History:  Diagnosis Date    Anemia    Arthritis    Atrial fibrillation (HCC)    Bleeding nose    history of    ESRD (end stage renal disease) (HCC)    History of bronchitis    Hyperlipemia    Hypertension    Joint inflammation    Obesity    PONV (postoperative nausea and vomiting)    Rotator cuff tear arthropathy of right shoulder 06/18/2015   Thyroid disease    Wears glasses      Family History  Problem Relation Age of Onset   Dementia Mother    Cancer Father      Current Outpatient Medications:    amLODipine (NORVASC) 10 MG tablet, Take 5 mg by mouth daily. , Disp: , Rfl:    apixaban (ELIQUIS) 5 MG TABS tablet, Take 1 tablet (5 mg total) by mouth 2 (two) times daily., Disp: 60 tablet, Rfl: 2   atorvastatin (LIPITOR) 40 MG tablet, Take 1 tablet (40 mg total) by mouth daily., Disp: 30 tablet, Rfl: 2   furosemide (LASIX) 40 MG tablet, Take 40 mg by mouth daily., Disp: , Rfl:    magnesium gluconate (MAGONATE) 500 MG tablet, Take 500 mg by mouth 2 (two) times daily., Disp: , Rfl:    metoprolol (LOPRESSOR) 50 MG tablet, Take 50 mg by mouth 2 (two) times daily., Disp: , Rfl:    mycophenolate (CELLCEPT) 250 MG capsule, Take 500 mg by mouth 2 (two) times daily., Disp: , Rfl:    pantoprazole (PROTONIX) 40 MG tablet, Take  40 mg by mouth daily. , Disp: , Rfl:    pneumococcal 20-Val Conj Vacc (PREVNAR 20) 0.5 ML injection, Inject 0.5 mLs into the muscle tomorrow at 10 am for 1 dose., Disp: 0.5 mL, Rfl: 0   potassium chloride SA (K-DUR,KLOR-CON) 20 MEQ tablet, Take 20 mEq by mouth daily. , Disp: , Rfl:    tacrolimus (PROGRAF) 0.5 MG capsule, Take 0.5 mg by mouth 2 (two) times daily. Take one capsule along with the 26m capsule, Disp: , Rfl:    tacrolimus (PROGRAF) 1 MG capsule, Take 2 mg by mouth 2 (two) times daily. , Disp: , Rfl:    Vitamin D, Ergocalciferol, (DRISDOL) 1.25 MG (50000 UNIT) CAPS capsule, TAKE 1 CAPSULE BY MOUTH 2 TIMES A WEEK, Disp: 12 capsule, Rfl: 2   Zoster Vaccine Adjuvanted (SHINGRIX)  injection, Inject 0.5 mLs into the muscle once for 1 dose., Disp: 0.5 mL, Rfl: 0   b complex-vitamin c-folic acid (NEPHRO-VITE) 0.8 MG TABS, Take 60-300 mg by mouth at bedtime., Disp: , Rfl:    Allergies  Allergen Reactions   Vancomycin Hives, Itching, Rash and Other (See Comments)    Blistering, swelling, and peeling of feet.  Pt denies any similar reactions around face or neck.   Iohexol      Desc: iodine contrast/per dialysis center, Onset Date: [id number]     Review of Systems  Constitutional: Negative.  Negative for fatigue.  HENT: Negative.    Respiratory: Negative.  Negative for shortness of breath.   Cardiovascular: Negative.  Negative for chest pain, palpitations and leg swelling.  Endocrine: Negative for polydipsia, polyphagia and polyuria.  Musculoskeletal: Negative.   Skin: Negative.   Neurological: Negative.  Negative for dizziness and headaches.  Psychiatric/Behavioral: Negative.      Today's Vitals   04/08/21 0841  BP: 124/80  Pulse: 69  Temp: 98.1 F (36.7 C)  Weight: 191 lb 6.4 oz (86.8 kg)  Height: 5' 5" (1.651 m)  PainSc: 0-No pain   Body mass index is 31.85 kg/m.   Objective:  Physical Exam Constitutional:      General: She is not in acute distress.    Appearance: Normal appearance. She is obese.  Cardiovascular:     Rate and Rhythm: Normal rate and regular rhythm.     Pulses: Normal pulses.     Heart sounds: Normal heart sounds. No murmur heard. Pulmonary:     Effort: Pulmonary effort is normal. No respiratory distress.     Breath sounds: Normal breath sounds. No wheezing.  Musculoskeletal:     Right lower leg: No edema.     Left lower leg: No edema.  Skin:    General: Skin is warm and dry.  Neurological:     General: No focal deficit present.     Mental Status: She is alert and oriented to person, place, and time.     Cranial Nerves: No cranial nerve deficit.  Psychiatric:        Mood and Affect: Mood normal.        Behavior: Behavior  normal.        Thought Content: Thought content normal.        Judgment: Judgment normal.        Assessment And Plan:     1. Hypertension, essential Comments: Good control Continue current medications Will check eGFR - CMP14+EGFR  2. Abnormal glucose Comments: Encouraged to limit intake of sugary foods and drinks Increase physical activity as tolerated Goal to keep her HgbA1c  less than 7 - Hemoglobin A1c  3. Mixed hyperlipidemia Comments: Excellent control Will not obtain lipid panel is within normal range at last visit  4. Vitamin D deficiency  5. PAD (peripheral artery disease) (HCC) Comments: Stable, currently on Eliquis  6. Encounter for immunization Comments: Rx for Shingrix and Prevnar 20 sent to pharmacy - Zoster Vaccine Adjuvanted [facility]) injection; Inject 0.5 mLs into the muscle once for 1 dose.  Dispense: 0.5 mL; Refill: 0 - pneumococcal 20-Val Conj Vacc (PREVNAR 20) 0.5 ML injection; Inject 0.5 mLs into the muscle tomorrow at 10 am for 1 dose.  Dispense: 0.5 mL; Refill: 0  7. Status post left cataract extraction Comments: Overall doing well.  8. Class 1 obesity due to excess calories with body mass index (BMI) of 31.0 to 31.9 in adult, unspecified whether serious comorbidity present  Chronic, she has lost 10 lbs since her last visit Discussed healthy diet and regular exercise options  Encouraged to exercise at least 150 minutes per week with 2 days of strength training as tolerated   Patient was given opportunity to ask questions. Patient verbalized understanding of the plan and was able to repeat key elements of the plan. All questions were answered to their satisfaction.  Sarah Brine, FNP   I, Sarah Brine, FNP, have reviewed all documentation for this visit. The documentation on 04/08/21 for the exam, diagnosis, procedures, and orders are all accurate and complete.   IF YOU HAVE BEEN REFERRED TO A SPECIALIST, IT MAY TAKE 1-2 WEEKS TO SCHEDULE/PROCESS  THE REFERRAL. IF YOU HAVE NOT HEARD FROM US/SPECIALIST IN TWO WEEKS, PLEASE GIVE [name] A CALL AT [phone].   THE PATIENT IS ENCOURAGED TO PRACTICE SOCIAL DISTANCING DUE TO THE COVID-19 PANDEMIC.

## 2021-05-22 ENCOUNTER — Encounter: Payer: Self-pay | Admitting: Nurse Practitioner

## 2021-05-22 ENCOUNTER — Ambulatory Visit (INDEPENDENT_AMBULATORY_CARE_PROVIDER_SITE_OTHER): Payer: Medicare Other | Admitting: Nurse Practitioner

## 2021-05-22 ENCOUNTER — Other Ambulatory Visit: Payer: Self-pay

## 2021-05-22 VITALS — BP 126/84 | HR 65 | Temp 98.3°F | Ht 65.0 in | Wt 184.6 lb

## 2021-05-22 DIAGNOSIS — R21 Rash and other nonspecific skin eruption: Secondary | ICD-10-CM | POA: Diagnosis not present

## 2021-05-22 DIAGNOSIS — L0232 Furuncle of buttock: Secondary | ICD-10-CM | POA: Diagnosis not present

## 2021-05-22 DIAGNOSIS — Z23 Encounter for immunization: Secondary | ICD-10-CM

## 2021-05-22 MED ORDER — NYSTATIN 100000 UNIT/GM EX CREA: 1.0000 "application " | TOPICAL_CREAM | Freq: Two times a day (BID) | CUTANEOUS | 2 refills | Status: DC

## 2021-05-22 MED ORDER — BACITRACIN ZINC 500 UNIT/GM EX OINT: TOPICAL_OINTMENT | CUTANEOUS | 2 refills | Status: DC

## 2021-05-22 MED ORDER — DOXYCYCLINE HYCLATE 50 MG PO CAPS: 50.0000 mg | ORAL_CAPSULE | Freq: Two times a day (BID) | ORAL | 0 refills | Status: DC

## 2021-05-22 NOTE — Patient Instructions (Addendum)
Use nystatin cream to upper crease of buttocks and upper part of buttocks.  Apply bacitracin ointment to the right side of your buttocks. Take doxycycline 2 times a day for 10 days.  Influenza (Flu) Vaccine (Inactivated or Recombinant): What You Need to Know 1. Why get vaccinated? Influenza vaccine can prevent influenza (flu). Flu is a contagious disease that spreads around the [place] every year, usually between October and May. Anyone can get the flu, but it is more dangerous for some people. Infants and young children, people 48 years and older, pregnant people, and people with certain health conditions or a weakened immune system are at greatest risk of flu complications. Pneumonia, bronchitis, sinus infections, and ear infections are examples of flu-related complications. If you have a medical condition, such as heart disease, cancer, or diabetes, flu can make it worse. Flu can cause fever and chills, sore throat, muscle aches, fatigue, cough, headache, and runny or stuffy nose. Some people may have vomiting and diarrhea, though this is more common in children than adults. In an average year, thousands of people in the [place] die from flu, and many more are hospitalized. Flu vaccine prevents millions of illnesses and flu-related visits to the doctor each year. 2. Influenza vaccines CDC recommends everyone 6 months and older get vaccinated every flu season. Children 6 months through 51 years of age may need 2 doses during a single flu season. Everyone else needs only 1 dose each flu season. It takes about 2 weeks for protection to develop after vaccination. There are many flu viruses, and they are always changing. Each year a new flu vaccine is made to protect against the influenza viruses believed to be likely to cause disease in the upcoming flu season. Even when the vaccine doesn't exactly match these viruses, it may still provide some protection. Influenza vaccine does not cause  flu. Influenza vaccine may be given at the same time as other vaccines. 3. Talk with your health care provider Tell your vaccination provider if the person getting the vaccine: Has had an allergic reaction after a previous dose of influenza vaccine, or has any severe, life-threatening allergies Has ever had Guillain-Barr Syndrome (also called "GBS") In some cases, your health care provider may decide to postpone influenza vaccination until a future visit. Influenza vaccine can be administered at any time during pregnancy. People who are or will be pregnant during influenza season should receive inactivated influenza vaccine. People with minor illnesses, such as a cold, may be vaccinated. People who are moderately or severely ill should usually wait until they recover before getting influenza vaccine. Your health care provider can give you more information. 4. Risks of a vaccine reaction Soreness, redness, and swelling where the shot is given, fever, muscle aches, and headache can happen after influenza vaccination. There may be a very small increased risk of Guillain-Barr Syndrome (GBS) after inactivated influenza vaccine (the flu shot). Young children who get the flu shot along with pneumococcal vaccine (PCV13) and/or DTaP vaccine at the same time might be slightly more likely to have a seizure caused by fever. Tell your health care provider if a child who is getting flu vaccine has ever had a seizure. People sometimes faint after medical procedures, including vaccination. Tell your provider if you feel dizzy or have vision changes or ringing in the ears. As with any medicine, there is a very remote chance of a vaccine causing a severe allergic reaction, other serious injury, or death. 5. What if there  is a serious problem? An allergic reaction could occur after the vaccinated person leaves the clinic. If you see signs of a severe allergic reaction (hives, swelling of the face and throat,  difficulty breathing, a fast heartbeat, dizziness, or weakness), call 9-1-1 and get the person to the nearest hospital. For other signs that concern you, call your health care provider. Adverse reactions should be reported to the Vaccine Adverse Event Reporting System (VAERS). Your health care provider will usually file this report, or you can do it yourself. Visit the VAERS website at www.vaers.SamedayNews.es or call 2496997506. VAERS is only for reporting reactions, and VAERS staff members do not give medical advice. 6. The National Vaccine Injury Compensation Program The [employer] Vaccine Injury Compensation Program (VICP) is a federal program that was created to compensate people who may have been injured by certain vaccines. Claims regarding alleged injury or death due to vaccination have a time limit for filing, which may be as short as two years. Visit the VICP website at GoldCloset.com.ee or call 314-511-2642 to learn about the program and about filing a claim. 7. How can I learn more? Ask your health care provider. Call your local or state health department. Visit the website of the Food and Drug Administration (FDA) for vaccine package inserts and additional information at TraderRating.uy. Contact the Centers for Disease Control and Prevention (CDC): Call 817-647-3873 (1-800-CDC-INFO) or Visit CDC's website at https://gibson.com/. Vaccine Information Statement Inactivated Influenza Vaccine (03/22/2020) This information is not intended to replace advice given to you by your health care provider. Make sure you discuss any questions you have with your health care provider. Document Revised: 05/09/2020 Document Reviewed: 05/09/2020 Elsevier Patient Education  2022 [employer].

## 2021-05-22 NOTE — Progress Notes (Signed)
I,[name],acting as a Education administrator for [employer], FNP.,have documented all relevant documentation on the behalf of [name], FNP,as directed by  [name], FNP while in the presence of [name], [place].   This visit occurred during the SARS-CoV-2 public health emergency.  Safety protocols were in place, including screening questions prior to the visit, additional usage of staff PPE, and extensive cleaning of exam room while observing appropriate contact time as indicated for disinfecting solutions.  Subjective:     Patient ID: Sarah Gilbert , female    DOB: [birth year] , 75 y.o.   MRN: [id number]   Chief Complaint  Patient presents with   Recurrent Skin Infections    buttocks    HPI  The patient is here for evaluation of a boil. She has been having slightly open areas to her right buttocks and the top of her buttocks; this will come and go as well.   Wt Readings from Last 3 Encounters: 05/22/21 : 184 lb 9.6 oz (83.7 kg) 04/08/21 : 191 lb 6.4 oz (86.8 kg) 12/16/20 : 201 lb (91.2 kg)  Reports she is trying to lose weight, she is cutting back on her sodas and intake of sugar.        Past Medical History:  Diagnosis Date   Anemia    Arthritis    Atrial fibrillation (HCC)    Bleeding nose    history of    ESRD (end stage renal disease) (HCC)    History of bronchitis    Hyperlipemia    Hypertension    Joint inflammation    Obesity    PONV (postoperative nausea and vomiting)    Rotator cuff tear arthropathy of right shoulder 06/18/2015   Thyroid disease    Wears glasses      Family History  Problem Relation Age of Onset   Dementia Mother    Cancer Father      Current Outpatient Medications:    amLODipine (NORVASC) 10 MG tablet, Take 5 mg by mouth daily. , Disp: , Rfl:    apixaban (ELIQUIS) 5 MG TABS tablet, Take 1 tablet (5 mg total) by mouth 2 (two) times daily., Disp: 60 tablet, Rfl: 2   atorvastatin (LIPITOR) 40 MG tablet, Take 1 tablet (40 mg  total) by mouth daily., Disp: 30 tablet, Rfl: 2   b complex-vitamin c-folic acid (NEPHRO-VITE) 0.8 MG TABS, Take 60-300 mg by mouth at bedtime., Disp: , Rfl:    bacitracin ointment, Apply to affected area daily, Disp: 28 g, Rfl: 2   doxycycline (VIBRAMYCIN) 50 MG capsule, Take 1 capsule (50 mg total) by mouth 2 (two) times daily., Disp: 20 capsule, Rfl: 0   furosemide (LASIX) 40 MG tablet, Take 40 mg by mouth daily., Disp: , Rfl:    magnesium gluconate (MAGONATE) 500 MG tablet, Take 500 mg by mouth 2 (two) times daily., Disp: , Rfl:    metoprolol (LOPRESSOR) 50 MG tablet, Take 50 mg by mouth 2 (two) times daily., Disp: , Rfl:    mycophenolate (CELLCEPT) 250 MG capsule, Take 500 mg by mouth 2 (two) times daily., Disp: , Rfl:    nystatin cream (MYCOSTATIN), Apply 1 application topically 2 (two) times daily., Disp: 30 g, Rfl: 2   pantoprazole (PROTONIX) 40 MG tablet, Take 40 mg by mouth daily. , Disp: , Rfl:    potassium chloride SA (K-DUR,KLOR-CON) 20 MEQ tablet, Take 20 mEq by mouth daily. , Disp: , Rfl:    tacrolimus (PROGRAF) 0.5 MG capsule,  Take 0.5 mg by mouth 2 (two) times daily. Take one capsule along with the 1mg  capsule, Disp: , Rfl:    tacrolimus (PROGRAF) 1 MG capsule, Take 2 mg by mouth 2 (two) times daily. , Disp: , Rfl:    Vitamin D, Ergocalciferol, (DRISDOL) 1.25 MG (50000 UNIT) CAPS capsule, TAKE 1 CAPSULE BY MOUTH 2 TIMES A WEEK, Disp: 12 capsule, Rfl: 2   Allergies  Allergen Reactions   Vancomycin Hives, Itching, Rash and Other (See Comments)    Blistering, swelling, and peeling of feet.  Pt denies any similar reactions around face or neck.   Iohexol      Desc: iodine contrast/per dialysis center, Onset Date: [id number]      Review of Systems  Constitutional: Negative.   Respiratory: Negative.    Cardiovascular: Negative.   Gastrointestinal: Negative.   Skin:        Rash on buttocks   Neurological:  Negative for dizziness and headaches.  Psychiatric/Behavioral:  Negative.    All other systems reviewed and are negative.   Today's Vitals   05/22/21 1448  BP: 126/84  Pulse: 65  Temp: 98.3 F (36.8 C)  Weight: 184 lb 9.6 oz (83.7 kg)  Height: 5\' 5"  (1.651 m)  PainSc: 0-No pain   Body mass index is 30.72 kg/m.  Wt Readings from Last 3 Encounters:  05/22/21 184 lb 9.6 oz (83.7 kg)  04/08/21 191 lb 6.4 oz (86.8 kg)  12/16/20 201 lb (91.2 kg)    BP Readings from Last 3 Encounters:  05/22/21 126/84  04/08/21 124/80  12/16/20 137/82    Objective:  Physical Exam Vitals reviewed.  Constitutional:      General: She is not in acute distress.    Appearance: Normal appearance. She is obese.  Cardiovascular:     Rate and Rhythm: Normal rate and regular rhythm.     Pulses: Normal pulses.     Heart sounds: Normal heart sounds. No murmur heard. Skin:    Capillary Refill: Capillary refill takes less than 2 seconds.     Findings: Rash present.     Comments: Has slightly open area to top of crease of buttocks and hyperpigmented skin just below  also with healing dry wound to left buttocks  Neurological:     General: No focal deficit present.     Mental Status: She is alert and oriented to person, place, and time.     Cranial Nerves: No cranial nerve deficit.     Motor: No weakness.  Psychiatric:        Mood and Affect: Mood normal.        Behavior: Behavior normal.        Thought Content: Thought content normal.        Judgment: Judgment normal.        Assessment And Plan:     1. Boil of buttock Comments: Will treat with antibiotic, if worse return call to office. - doxycycline (VIBRAMYCIN) 50 MG capsule; Take 1 capsule (50 mg total) by mouth 2 (two) times daily.  Dispense: 20 capsule; Refill: 0  2. Rash and nonspecific skin eruption Comments: hyperpigmented area to buttocks crease and at top is slightly open area. Left buttocks with healing shearing wound.  3. Need for vaccination Influenza vaccine administered Encouraged to take  Tylenol as needed for fever or muscle aches. - Flu Vaccine QUAD High Dose(Fluad)    Patient was given opportunity to ask questions. Patient verbalized understanding of the plan and was able  to repeat key elements of the plan. All questions were answered to their satisfaction.  [name], FNP   I, [name], FNP, have reviewed all documentation for this visit. The documentation on 05/22/21 for the exam, diagnosis, procedures, and orders are all accurate and complete.   IF YOU HAVE BEEN REFERRED TO A SPECIALIST, IT MAY TAKE 1-2 WEEKS TO SCHEDULE/PROCESS THE REFERRAL. IF YOU HAVE NOT HEARD FROM US/SPECIALIST IN TWO WEEKS, PLEASE GIVE [name] A CALL AT [phone] X [phone].   THE PATIENT IS ENCOURAGED TO PRACTICE SOCIAL DISTANCING DUE TO THE COVID-19 PANDEMIC.

## 2021-06-18 ENCOUNTER — Encounter: Payer: Self-pay | Admitting: Adult Health

## 2021-06-18 ENCOUNTER — Ambulatory Visit: Payer: Medicare Other | Admitting: Adult Health

## 2021-06-18 VITALS — BP 128/62 | HR 66 | Ht 65.0 in | Wt 196.2 lb

## 2021-06-18 DIAGNOSIS — Z8673 Personal history of transient ischemic attack (TIA), and cerebral infarction without residual deficits: Secondary | ICD-10-CM | POA: Diagnosis not present

## 2021-06-18 DIAGNOSIS — G4733 Obstructive sleep apnea (adult) (pediatric): Secondary | ICD-10-CM | POA: Diagnosis not present

## 2021-06-18 DIAGNOSIS — I4811 Longstanding persistent atrial fibrillation: Secondary | ICD-10-CM

## 2021-06-18 DIAGNOSIS — G3184 Mild cognitive impairment, so stated: Secondary | ICD-10-CM | POA: Diagnosis not present

## 2021-06-18 NOTE — Progress Notes (Signed)
[facility] [address] [place]. Sarah Gilbert [id number] [phone]       STROKE FOLLOW UP NOTE  [name] Date of Birth:  1946/05/09 Medical Record Number:  [id number]   Reason for Referral: stroke follow up    SUBJECTIVE:   CHIEF COMPLAINT:  Chief Complaint  Patient presents with   Follow-up    Rm 2 with uncle Sarah Gilbert here for 6 month f/u reports she has been doing well since last visit.       HPI:   Update 06/18/2021 JM: Returns for 16-month stroke follow-up accompanied by her uncle, Sarah Gilbert  Overall stable from stroke standpoint since prior visit without new stroke/TIA symptoms Continued short-term memory difficulties - stable.  Continue with ADLs and majority of ADLs independently.  She continues to live with her uncle. She is very frustrated regarding her continued short-term memory difficulties. She is frustrated that she cannot remember things like she used to such as where she set something down, things people recently said to her, etc. Long term intact. She denies depression/anxiety but during visit, likely underlying anxiety contributing. She does admit to not doing ANY memory exercises (which has been discussed at prior few visits). She tries to keep active (involved in church, cares for her elderly aunt) but no routine exercise.  Reports she sleeps well at night.  Appetite good.  Compliant on Eliquis and atorvastatin -denies side effects Blood pressure today 128/62 -routinely monitors at home and typically stable Routinely followed by PCP Sarah Brine, FNP   CPAP not yet started - per uncle, he spoke to DME since prior visit but they did not have any machines available - was told they would call him once one available but has not heard anything yet.   No new concerns at this time    History provided for reference purposes only Update 12/16/2020 JM: [name] returns for 19-month stroke follow-up accompanied by her uncle,  Sarah Gilbert from stroke standpoint without new stroke/TIA symptoms Reports continued short-term memory without worsening but denies improvement.  She does not do any memory exercises nor routine physical activity or exercise.  She is able to maintain ADLs independently and majority of IADLs.  She continues to live with her uncle who assists as needed Compliant on Eliquis and atorvastatin without associated side effects Blood pressure today 137/82 - monitors at home and typically lower 110-120/80s   She wished to initiate CPAP at prior follow-up visit which was initiated by Dr. Tori Milks nurse Cyril Mourning Dinkins, RN. Per epic review, [facility] attempted to reach him multiple times in February but per pt and uncle, they did not receive any calls to start.   No further concerns at this time   Update 06/17/2020 JM: [name] returns for stroke follow-up accompanied by her uncle.  C/o short term memory difficulty since her stroke such as misplacing items as well as difficulty with comprehension or remembering what is being told her.  This has been stable without worsening.  She is able to maintain her ADLs and majority of IADLs independently. Hx of mild short-term memory impairment prior to her stroke.  She was evaluated by speech therapy at home and discharged as SLP not indicated per patient.  Denies new or worsening stroke/TIA symptoms.  She remains on Eliquis and atorvastatin for secondary stroke prevention without side effects.  Blood pressure today 136/72.  Evaluated by Dr. Rexene Alberts for possible sleep apnea undergoing baseline sleep study on 03/31/2020 which did show evidence  of mild sleep apnea and recommended initiating AutoPap therapy.  She was should further speak with her cardiologist and nephrologist prior to initiating who recommended treatment for sleep apnea and is now ready to proceed with AutoPap therapy.  No further concerns at this time.  Initial visit 02/13/2020 JM: [name] is being seen  for hospital follow-up accompanied by her uncle.  She has been doing well since discharge without residual stroke deficits.  Participated in [facility] PT/OT which has since been completed.  She does report increased fatigue since her stroke.  Sleeps well at night.  Sleeps alone but denies snoring or witnessed apneas.  She has not previously underwent sleep study to rule out sleep apnea.  Continues on Eliquis 5 mg twice daily without bleeding or bruising.  Continues to follow with cardiologist Dr. Terrence Dupont routinely.  Continues on atorvastatin 40 mg daily without myalgias.  Blood pressure today 143/80.  No concerns at this time.  Stroke admission 01/07/2020 [name] is a 75 y.o. female with history of double kidney transplant (2011), HTN, . A fib (on xarelto), HLD who presented on 01/07/2020 with L isded weakness, R gaze, L facial and slurred speech.  Stroke work-up revealed several small right MCA infarcts w/ right ICA terminus thrombus s/p TICI 3 revascularization, infarcts embolic secondary to known AF off AC.  MRA head/neck unremarkable post IR.  Previously on Xarelto but per report, missed 3 to 4 days due to running out of prescription and transition to Eliquis 5 mg twice daily for secondary stroke prevention and history of atrial fibrillation.  History of HTN and resumed home amlodipine, Lasix and metoprolol.  History of HLD with LDL 74 and increased home dose atorvastatin from 20 mg to 40 mg daily.  Other stroke risk factors include advanced age and obesity but no prior stroke history.  Other active problems include ESRD s/p bilateral kidney transplant on Prograf and CellCept (not on HD), thyroid disease and hypokalemia.  Evaluated by therapies and discharged home with home health PT/OT.   Stroke:   Severeal small R MCA infarcts embolic secondary to known AF off AC Code Stroke CT head hyperdense R supraclinoid ICA. ASPECTS 10.    Cerebral angio R ICA terminus thrombus s/p TICI3 revascularization w/  2 passes Earleen Newport) Post IR CT no hemorrhage, no statin  MRI  Several small scattered R hemisphere infarcts MRA head mild intracranial atherosclerosis  MRA neck Unremarkable  2D Echo EF 60-65%. No source of embolus. LA dilated LDL 74 -increase atorvastatin from 20 mg to 40 mg daily HgbA1c 6.1 Xarelto (rivaroxaban) daily prior to admission but had missed 3-4 days, now on Eliquis 5 mg bid.  Therapy recommendations:  HH PT, [facility] - [facility] Disposition:  return home      ROS:   14 system review of systems performed and negative with exception of short-term memory loss  PMH:  Past Medical History:  Diagnosis Date   Anemia    Arthritis    Atrial fibrillation ([place])    Bleeding nose    history of    ESRD (end stage renal disease) (HCC)    History of bronchitis    Hyperlipemia    Hypertension    Joint inflammation    Obesity    PONV (postoperative nausea and vomiting)    Rotator cuff tear arthropathy of right shoulder 06/18/2015   Thyroid disease    Wears glasses     PSH:  Past Surgical History:  Procedure Laterality Date  ABDOMINAL HYSTERECTOMY     APPENDECTOMY     CARDIAC CATHETERIZATION N/A 04/18/2015   Procedure: Left Heart Cath and Coronary Angiography;  Surgeon: Charolette Forward, MD;  Location: [place] [facility];  Service: Cardiovascular;  Laterality: N/A;   CHOLECYSTECTOMY     COLONOSCOPY     IR CT HEAD LTD  01/07/2020   IR PERCUTANEOUS ART THROMBECTOMY/INFUSION INTRACRANIAL INC DIAG ANGIO  01/07/2020   IR US GUIDE VASC ACCESS RIGHT  01/07/2020   KIDNEY TRANSPLANT     june 2011-[facility]   RADIOLOGY WITH ANESTHESIA N/A 01/07/2020   Procedure: IR WITH ANESTHESIA;  Surgeon: Radiologist, Medication, MD;  Location: [place];  Service: Radiology;  Laterality: N/A;   TONSILLECTOMY     TOTAL SHOULDER ARTHROPLASTY Right 06/18/2015   Procedure: RIGHT TOTAL SHOULDER ARTHROPLASTY;  Surgeon: Marchia Bond, MD;  Location: [place];  Service: Orthopedics;  Laterality: Right;    Social  History:  Social History   Socioeconomic History   Marital status: Single    Spouse name: Not on file   Number of children: Not on file   Years of education: Not on file   Highest education level: Not on file  Occupational History   Occupation: retired  Tobacco Use   Smoking status: Never   Smokeless tobacco: Never  Vaping Use   Vaping Use: Never used  Substance and Sexual Activity   Alcohol use: No   Drug use: No   Sexual activity: Not Currently  Other Topics Concern   Not on file  Social History Narrative   Not on file   Social Determinants of Health   Financial Resource Strain: Low Risk    Difficulty of Paying Living Expenses: Not hard at all  Food Insecurity: No Food Insecurity   Worried About Charity fundraiser in the Last Year: Never true   [place] in the Last Year: Never true  Transportation Needs: No Transportation Needs   Lack of Transportation (Medical): No   Lack of Transportation (Non-Medical): No  Physical Activity: Inactive   Days of Exercise per Week: 0 days   Minutes of Exercise per Session: 0 min  Stress: No Stress Concern Present   Feeling of Stress : Not at all  Social Connections: Not on file  Intimate Partner Violence: Not on file    Family History:  Family History  Problem Relation Age of Onset   Dementia Mother    Cancer Father     Medications:   Current Outpatient Medications on File Prior to Visit  Medication Sig Dispense Refill   amLODipine (NORVASC) 10 MG tablet Take 5 mg by mouth daily.      apixaban (ELIQUIS) 5 MG TABS tablet Take 1 tablet (5 mg total) by mouth 2 (two) times daily. 60 tablet 2   atorvastatin (LIPITOR) 40 MG tablet Take 1 tablet (40 mg total) by mouth daily. 30 tablet 2   b complex-vitamin c-folic acid (NEPHRO-VITE) 0.8 MG TABS Take 60-300 mg by mouth at bedtime.     bacitracin ointment Apply to affected area daily 28 g 2   doxycycline (VIBRAMYCIN) 50 MG capsule Take 1 capsule (50 mg total) by mouth 2  (two) times daily. 20 capsule 0   furosemide (LASIX) 40 MG tablet Take 40 mg by mouth daily.     magnesium gluconate (MAGONATE) 500 MG tablet Take 500 mg by mouth 2 (two) times daily.     metoprolol (LOPRESSOR) 50 MG tablet Take 50 mg by mouth 2 (  two) times daily.     mycophenolate (CELLCEPT) 250 MG capsule Take 500 mg by mouth 2 (two) times daily.     nystatin cream (MYCOSTATIN) Apply 1 application topically 2 (two) times daily. 30 g 2   pantoprazole (PROTONIX) 40 MG tablet Take 40 mg by mouth daily.      potassium chloride SA (K-DUR,KLOR-CON) 20 MEQ tablet Take 20 mEq by mouth daily.      tacrolimus (PROGRAF) 0.5 MG capsule Take 0.5 mg by mouth 2 (two) times daily. Take one capsule along with the 1mg  capsule     tacrolimus (PROGRAF) 1 MG capsule Take 2 mg by mouth 2 (two) times daily.      Vitamin D, Ergocalciferol, (DRISDOL) 1.25 MG (50000 UNIT) CAPS capsule TAKE 1 CAPSULE BY MOUTH 2 TIMES A WEEK 12 capsule 2   No current facility-administered medications on file prior to visit.    Allergies:   Allergies  Allergen Reactions   Vancomycin Hives, Itching, Rash and Other (See Comments)    Blistering, swelling, and peeling of feet.  Pt denies any similar reactions around face or neck.   Iohexol      Desc: iodine contrast/per dialysis center, Onset Date: [id number]       OBJECTIVE:  Physical Exam  Vitals:   06/18/21 0847  BP: 128/62  Pulse: 66  SpO2: 99%  Weight: 196 lb 4 oz (89 kg)  Height: 5\' 5"  (1.651 m)    Body mass index is 32.66 kg/m. No results found.  General: well developed, well nourished, pleasant elderly African-American female, seated, in no evident distress Head: head normocephalic and atraumatic.   Neck: supple with no carotid or supraclavicular bruits Cardiovascular: irregular rate and rhythm, no murmurs Musculoskeletal: no deformity Skin:  no rash/petichiae Vascular:  Normal pulses all extremities   Neurologic Exam Mental Status: Awake and fully alert.   Fluent speech and language. Oriented to place and time. Recent memory subjectively impaired and remote memory intact. Attention span and concentration impaired with repeating questions and needing repeat clarification and fund of knowledge appropriate. Mood and affect appropriate. Recall 2/3 (prior 1/3). Serial addition good. 4 legged animal 10 (prior 9) in 60 seconds. Clock drawing 4/4 Cranial Nerves: Pupils equal, briskly reactive to light. Extraocular movements full without nystagmus. Visual fields full to confrontation. Hearing intact. Facial sensation intact. Face, tongue, palate moves normally and symmetrically.  Motor: Normal bulk and tone. Normal strength in all tested extremity muscles. Sensory.: intact to touch , pinprick , position and vibratory sensation.  Coordination: Rapid alternating movements normal in all extremities except slightly decreased left hand. Finger-to-nose and heel-to-shin performed accurately bilaterally. Mild action tremor right hand (chronic) Gait and Station: Arises from chair without difficulty. Stance is normal. Gait demonstrates normal stride length and balance with use of cane Reflexes: 1+ and symmetric. Toes downgoing.        ASSESSMENT/PLAN: Sarah Gilbert is a 75 y.o. year old female presented with left-sided weakness, right gaze preference, left facial droop and slurred speech on 01/07/2020 with stroke work-up revealing several small right MCA infarcts w/ right ICA terminus thrombus s/p TICI 3 revascularization, infarct embolic secondary to known AF off AC (missed 3-4 doses of Xarelto).  Transitioned to Eliquis for secondary stroke prevention.  Vascular risk factors include HTN, HLD, A. Fib, obesity and recent diagnosis of mild OSA.      Right MCA infarcts:  Residual deficits: Short-term memory complaints with baseline memory impairment prior to stroke likely mild cognitive impairment. Referral  placed to SLP for cognitive therapy although suspect  underlying anxiety largely contributing. Advised to f/u with PCP for further discussion.  Discussed compensation strategies as well as importance of memory exercises such as crossword puzzles, word search, sudoku, card games and reading as well as managing stroke risk factors, routine physical activity, healthy diet and adequate sleep. Also hx of sleep apnea but not yet started CPAP - will further look into this Continue Eliquis (apixaban) daily  and atorvastatin 40 mg daily for secondary stroke prevention. Discussed secondary stroke prevention and importance of close PCP follow-up for management of stroke risk factors for secondary stroke prevention including HTN with BP goal<130/90 and HLD with LDL goal<40  Atrial fibrillation: On Eliquis 5 mg twice daily for CHA2DS2-VASc score 5 Followed by cardiology for monitoring and management  Obstructive sleep apnea Baseline sleep study 03/31/2020 by Dr. Rexene Alberts which showed mild OSA Will look into why CPAP not yet started - previously machines on back order  Discussed importance of CPAP in relation to prior stroke, atrial fibrillation and memory complaints     Follow-up will be determined once CPAP initiated   CC:  Sarah Brine, FNP    I spent 39 minutes of face-to-face and non-face-to-face time with patient and uncle.  This included previsit chart review, lab review, study review, order entry, electronic health record documentation, patient and uncle education and discussion regarding prior stroke with residual deficits, short-term memory concerns and possible contributing factors, secondary stroke prevention measures and aggressive stroke risk factor management and answered all other questions to patient's satisfaction  Frann Rider, [facility]  [facility] [address] [place], [place] [address]  Phone [phone] Fax [phone] Note: This document was prepared with digital dictation and possible smart  phrase technology. Any transcriptional errors that result from this process are unintentional.

## 2021-06-18 NOTE — Patient Instructions (Addendum)
Continue Eliquis (apixaban) daily  and atorvastatin for secondary stroke prevention  Continue to follow up with PCP regarding cholesterol and blood pressure management  Maintain strict control of hypertension with blood pressure goal below 130/90 and cholesterol with LDL cholesterol (bad cholesterol) goal below 70 mg/dL.   Please ensure you follow-up regarding sleep apnea and use of CPAP - we will reach out to [facility] to assist but if you do not hear from them over the next 1-2 weeks, please let us know  Please call speech (cognitive) therapy early next week to schedule initial evaluation for memory concerns    Follow up once CPAP started for initial CPAP compliance visit - please let us know when you start machine       Thank you for coming to see [name] at [facility]. I hope we have been able to provide you high quality care today.  You may receive a patient satisfaction survey over the next few weeks. We would appreciate your feedback and comments so that we may continue to improve ourselves and the health of our patients.

## 2021-06-25 ENCOUNTER — Ambulatory Visit: Payer: Medicare Other | Admitting: Physical Therapy

## 2021-06-26 ENCOUNTER — Other Ambulatory Visit: Payer: Self-pay

## 2021-06-26 DIAGNOSIS — G4733 Obstructive sleep apnea (adult) (pediatric): Secondary | ICD-10-CM

## 2021-07-02 ENCOUNTER — Ambulatory Visit: Payer: Medicare Other | Attending: Nurse Practitioner | Admitting: Speech Pathology

## 2021-07-02 ENCOUNTER — Encounter: Payer: Self-pay | Admitting: Speech Pathology

## 2021-07-02 ENCOUNTER — Other Ambulatory Visit: Payer: Self-pay

## 2021-07-02 DIAGNOSIS — R41841 Cognitive communication deficit: Secondary | ICD-10-CM | POA: Diagnosis not present

## 2021-07-02 NOTE — Therapy (Signed)
[facility] [address] [place], [name], [address] Phone: [phone]   Fax:  [phone]  Speech Language Pathology Evaluation  Patient Details  Name: Sarah Gilbert MRN: [id number] Date of Birth: 1946-06-10 Referring Provider (SLP): Frann Rider NP   Encounter Date: 07/02/2021   End of Session - 07/02/21 0957     Visit Number 1    Number of Visits 17    Date for SLP Re-Evaluation 09/01/21    SLP Start Time 0936   Pt late   SLP Stop Time  11    SLP Time Calculation (min) 39 min    Activity Tolerance Patient tolerated treatment well             Past Medical History:  Diagnosis Date   Anemia    Arthritis    Atrial fibrillation ([place])    Bleeding nose    history of    ESRD (end stage renal disease) ([place])    History of bronchitis    Hyperlipemia    Hypertension    Joint inflammation    Obesity    PONV (postoperative nausea and vomiting)    Rotator cuff tear arthropathy of right shoulder 06/18/2015   Thyroid disease    Wears glasses     Past Surgical History:  Procedure Laterality Date   ABDOMINAL HYSTERECTOMY     APPENDECTOMY     CARDIAC CATHETERIZATION N/A 04/18/2015   Procedure: Left Heart Cath and Coronary Angiography;  Surgeon: Charolette Forward, MD;  Location: [facility];  Service: Cardiovascular;  Laterality: N/A;   CHOLECYSTECTOMY     COLONOSCOPY     IR CT HEAD LTD  01/07/2020   IR PERCUTANEOUS ART THROMBECTOMY/INFUSION INTRACRANIAL INC DIAG ANGIO  01/07/2020   IR US GUIDE VASC ACCESS RIGHT  01/07/2020   KIDNEY TRANSPLANT     june 2011-[facility]   RADIOLOGY WITH ANESTHESIA N/A 01/07/2020   Procedure: IR WITH ANESTHESIA;  Surgeon: Radiologist, Medication, MD;  Location: [facility];  Service: Radiology;  Laterality: N/A;   TONSILLECTOMY     TOTAL SHOULDER ARTHROPLASTY Right 06/18/2015   Procedure: RIGHT TOTAL SHOULDER ARTHROPLASTY;  Surgeon: Marchia Bond, MD;  Location: [facility];  Service:  Orthopedics;  Laterality: Right;    There were no vitals filed for this visit.   Subjective Assessment - 07/02/21 1223     Subjective Pt was pleasant and cooperative during assessment.    Currently in Pain? No/denies                SLP Evaluation [facility] - 07/02/21 0941       SLP Visit Information   SLP Received On 07/02/21    Referring Provider (SLP) Frann Rider NP    Onset Date 01/06/21    Medical Diagnosis R CVA (2021)      Subjective   Patient/Family Stated Goal To work on my memory      General Information   HPI [name] is a 75 y.o. female with history of double kidney transplant (2011), HTN, . A fib (on xarelto), HLD who presented on 01/07/2020 with L isded weakness, R gaze, L facial and slurred speech.  Stroke work-up revealed several small right MCA infarcts w/ right ICA terminus thrombus s/p TICI 3 revascularization, infarcts embolic secondary to known AF off AC.  MRA head/neck unremarkable post IR. Continued short-term memory difficulties - stable.  Continue with ADLs and majority of ADLs independently.  She continues to live with her uncle.  As of Nov 2022, She is very frustrated regarding her continued short-term memory difficulties. She is frustrated that she cannot remember things like she used to such as where she set something down, things people recently said to her, etc. Long term intact. She denies depression/anxiety but during visit, likely underlying anxiety contributing. She does admit to not doing ANY memory exercises (which has been discussed at prior few visits).      Balance Screen   Has the patient fallen in the past 6 months No    Has the patient had a decrease in activity level because of a fear of falling?  No    Is the patient reluctant to leave their home because of a fear of falling?  No      Prior Functional Status   Cognitive/Linguistic Baseline Within functional limits    Type of Home House     Lives With Family    Available  Support Family    Education 12th    Vocation Retired      Associate Professor   Overall Cognitive Status Impaired/Different from baseline    Area of Impairment Attention;Memory      Auditory Comprehension   Overall Auditory Comprehension Appears within functional limits for tasks assessed      Verbal Expression   Overall Verbal Expression Appears within functional limits for tasks assessed      Written Expression   Dominant Hand Right    Written Expression Not tested      Oral Motor/Sensory Function   Overall Oral Motor/Sensory Function Appears within functional limits for tasks assessed      Motor Speech   Overall Motor Speech Appears within functional limits for tasks assessed      Standardized Assessments   Standardized Assessments  Cognitive Linguistic Quick Test      Cognitive Linguistic Quick Test (Ages 18-69)   Attention WNL    Memory Mild    Executive Function WNL    Language WNL    Visuospatial Skills WNL    Severity Rating Total 19    Composite Severity Rating 15.8                             SLP Education - 07/02/21 0956     Education Details cognitive-communication impairment    Person(s) Educated Patient;Caregiver(s)    Methods Explanation;Demonstration    Comprehension Verbalized understanding;Returned demonstration              SLP Short Term Goals - 07/02/21 1200       SLP SHORT TERM GOAL #1   Title Pt will recall 3 memory strategies to increase recall of personally-relevant, important information at home by the end of 4 weeks given minA verbal cues.    Time 4    Period Weeks    Status New    Target Date 07/30/21      SLP SHORT TERM GOAL #2   Title Pt will recall 3 attention strategies for optimization of focus during conversations to decrease cognitive fatigue and increase recall of important information given minA verbal cues.    Time 4    Period Weeks    Status New    Target Date 07/30/21              SLP Long Term  Goals - 07/02/21 1203       SLP LONG TERM GOAL #1   Title Pt will report successful use of 2+  memory strategies to increase recall of personally-relevant, important information at home.    Time 8    Period Weeks    Status New    Target Date 08/27/21      SLP LONG TERM GOAL #2   Title Pt will report successful use of 2+ attention strategies for optimization of focus during conversations to increase recall of verbal/written information.    Time 8    Period Weeks    Status New    Target Date 08/27/21              Plan - 07/02/21 0957     Clinical Impression Statement Pt is a 75 yo female who presents to OP ST for evaluation post CVA in May 2021. Pt reports she felt she had a decline in her memory "a little prior" to her stroke, but she felt like it has "gotten worse" since the stroke. She states that she commonly places an item somewhere and forgets where it is. In addition to trouble with "short term memory", she reports she is easily distracted. Her uncle also reported she will move through a task "over and over and over" (specifically paperwork) vs. reading once or twice and placing paperwork aside. SLP assessed pt using CLQT (see evaluation for scores). She benefited from repetition throughout assessment to aid comprehension of information. Her scores on the CLQT suggest "WFL" for her age overall; however, SLP suspects pt exhibits a mild cognitive-communication impairment characterized by deficits in memory, and executive functioning skills. Pt scores were at the cut-off between [facility] and mild for her age. Pt demonstrated most difficulty with working and short term memory, as well as, higher-level executive functioning skills. She reports she has difficulty retaining information in her head and asks people to repeat information. SLP to address strategies to support memory and attention skills to maximize functional independence.    Speech Therapy Frequency 2x / week    Duration 8 weeks     Treatment/Interventions Compensatory strategies;Cueing hierarchy;Functional tasks;Patient/family education;Environmental controls;Cognitive reorganization;Compensatory techniques;Internal/external aids;SLP instruction and feedback    Potential to Achieve Goals Good    Consulted and Agree with Plan of Care Patient;Family member/caregiver    Family Member Consulted [name]             Patient will benefit from skilled therapeutic intervention in order to improve the following deficits and impairments:   Cognitive communication deficit    Problem List Patient Active Problem List   Diagnosis Date Noted   PAD (peripheral artery disease) ([place]) 12/04/2020   Acute ischemic stroke ([place]) - R MCA embolic from AF off AC s/p thrombectomy 01/07/2020   Abnormal glucose 12/29/2018   Mixed hyperlipidemia 09/29/2018   Lipoma of back 09/29/2018   Rotator cuff tear arthropathy of right shoulder 06/18/2015   S/p reverse total shoulder arthroplasty 06/18/2015   A-fib ([place]) 05/01/2015   Chronic anticoagulation 05/01/2015   Right foot pain 03/25/2015   Shingles (herpes zoster) polyneuropathy 02/06/2014   Constipation 01/27/2012   Hypercholesteremia 01/27/2012   Hypertension, essential 01/27/2012   Immunosuppression ([place]) 01/27/2012   Obesity 01/27/2012    Rosann Auerbach [place] MS, [place], CBIS  07/02/2021, 12:27 PM  [facility] [address] [place], [name], [address] Phone: [phone]   Fax:  [phone]  Name: Sarah Gilbert MRN: [id number] Date of Birth: 01-01-1946

## 2021-07-09 ENCOUNTER — Encounter: Payer: Self-pay | Admitting: Nurse Practitioner

## 2021-07-09 ENCOUNTER — Other Ambulatory Visit: Payer: Self-pay

## 2021-07-09 ENCOUNTER — Ambulatory Visit (INDEPENDENT_AMBULATORY_CARE_PROVIDER_SITE_OTHER): Payer: Medicare Other | Admitting: Nurse Practitioner

## 2021-07-09 ENCOUNTER — Ambulatory Visit: Payer: Medicare Other | Admitting: Speech Pathology

## 2021-07-09 ENCOUNTER — Encounter: Payer: Self-pay | Admitting: Speech Pathology

## 2021-07-09 VITALS — BP 122/80 | HR 65 | Temp 98.6°F | Ht 65.0 in | Wt 197.2 lb

## 2021-07-09 DIAGNOSIS — R21 Rash and other nonspecific skin eruption: Secondary | ICD-10-CM | POA: Diagnosis not present

## 2021-07-09 DIAGNOSIS — E782 Mixed hyperlipidemia: Secondary | ICD-10-CM | POA: Diagnosis not present

## 2021-07-09 DIAGNOSIS — Z23 Encounter for immunization: Secondary | ICD-10-CM

## 2021-07-09 DIAGNOSIS — Z6832 Body mass index (BMI) 32.0-32.9, adult: Secondary | ICD-10-CM

## 2021-07-09 DIAGNOSIS — R7309 Other abnormal glucose: Secondary | ICD-10-CM | POA: Diagnosis not present

## 2021-07-09 DIAGNOSIS — R41841 Cognitive communication deficit: Secondary | ICD-10-CM | POA: Diagnosis not present

## 2021-07-09 DIAGNOSIS — E6609 Other obesity due to excess calories: Secondary | ICD-10-CM

## 2021-07-09 DIAGNOSIS — E559 Vitamin D deficiency, unspecified: Secondary | ICD-10-CM

## 2021-07-09 MED ORDER — ZOSTER VAC RECOMB ADJUVANTED 50 MCG/0.5ML IM SUSR: 0.5000 mL | Freq: Once | INTRAMUSCULAR | 0 refills | Status: AC

## 2021-07-09 MED ORDER — ZOSTER VAC RECOMB ADJUVANTED 50 MCG/0.5ML IM SUSR: 0.5000 mL | Freq: Once | INTRAMUSCULAR | 0 refills | Status: DC

## 2021-07-09 NOTE — Patient Instructions (Addendum)
Diabetes Mellitus and Nutrition, Adult ?When you have diabetes, or diabetes mellitus, it is very important to have healthy eating habits because your blood sugar (glucose) levels are greatly affected by what you eat and drink. Eating healthy foods in the right amounts, at about the same times every day, can help you: ?Manage your blood glucose. ?Lower your risk of heart disease. ?Improve your blood pressure. ?Reach or maintain a healthy weight. ?What can affect my meal plan? ?Every person with diabetes is different, and each person has different needs for a meal plan. Your health care provider may recommend that you work with a dietitian to make a meal plan that is best for you. Your meal plan may vary depending on factors such as: ?The calories you need. ?The medicines you take. ?Your weight. ?Your blood glucose, blood pressure, and cholesterol levels. ?Your activity level. ?Other health conditions you have, such as heart or kidney disease. ?How do carbohydrates affect me? ?Carbohydrates, also called carbs, affect your blood glucose level more than any other type of food. Eating carbs raises the amount of glucose in your blood. ?It is important to know how many carbs you can safely have in each meal. This is different for every person. Your dietitian can help you calculate how many carbs you should have at each meal and for each snack. ?How does alcohol affect me? ?Alcohol can cause a decrease in blood glucose (hypoglycemia), especially if you use insulin or take certain diabetes medicines by mouth. Hypoglycemia can be a life-threatening condition. Symptoms of hypoglycemia, such as sleepiness, dizziness, and confusion, are similar to symptoms of having too much alcohol. ?Do not drink alcohol if: ?Your health care provider tells you not to drink. ?You are pregnant, may be pregnant, or are planning to become pregnant. ?If you drink alcohol: ?Limit how much you have to: ?0-1 drink a day for women. ?0-2 drinks a day  for men. ?Know how much alcohol is in your drink. In the [place] one drink equals one 12 oz bottle of beer (355 mL), one 5 oz glass of wine (148 mL), or one 1? oz glass of hard liquor (44 mL). ?Keep yourself hydrated with water, diet soda, or unsweetened iced tea. Keep in mind that regular soda, juice, and other mixers may contain a lot of sugar and must be counted as carbs. ?What are tips for following this plan? ?Reading food labels ?Start by checking the serving size on the Nutrition Facts label of packaged foods and drinks. The number of calories and the amount of carbs, fats, and other nutrients listed on the label are based on one serving of the item. Many items contain more than one serving per package. ?Check the total grams (g) of carbs in one serving. ?Check the number of grams of saturated fats and trans fats in one serving. Choose foods that have a low amount or none of these fats. ?Check the number of milligrams (mg) of salt (sodium) in one serving. Most people should limit total sodium intake to less than 2,300 mg per day. ?Always check the nutrition information of foods labeled as "low-fat" or "nonfat." These foods may be higher in added sugar or refined carbs and should be avoided. ?Talk to your dietitian to identify your daily goals for nutrients listed on the label. ?Shopping ?Avoid buying canned, pre-made, or processed foods. These foods tend to be high in fat, sodium, and added sugar. ?Shop around the outside edge of the grocery store. This is where you will   most often find fresh fruits and vegetables, bulk grains, fresh meats, and fresh dairy products. Cooking Use low-heat cooking methods, such as baking, instead of high-heat cooking methods, such as deep frying. Cook using healthy oils, such as olive, canola, or sunflower oil. Avoid cooking with butter, cream, or high-fat meats. Meal planning Eat meals and snacks regularly, preferably at the same times every day. Avoid going long periods of  time without eating. Eat foods that are high in fiber, such as fresh fruits, vegetables, beans, and whole grains. Eat 4-6 oz (112-168 g) of lean protein each day, such as lean meat, chicken, fish, eggs, or tofu. One ounce (oz) (28 g) of lean protein is equal to: 1 oz (28 g) of meat, chicken, or fish. 1 egg.  cup (62 g) of tofu. Eat some foods each day that contain healthy fats, such as avocado, nuts, seeds, and fish. What foods should I eat? Fruits Berries. Apples. Oranges. Peaches. Apricots. Plums. Grapes. Mangoes. Papayas. Pomegranates. Kiwi. Cherries. Vegetables Leafy greens, including lettuce, spinach, kale, chard, collard greens, mustard greens, and cabbage. Beets. Cauliflower. Broccoli. Carrots. Green beans. Tomatoes. Peppers. Onions. Cucumbers. Brussels sprouts. Grains Whole grains, such as whole-wheat or whole-grain bread, crackers, tortillas, cereal, and pasta. Unsweetened oatmeal. Quinoa. Brown or wild rice. Meats and other proteins Seafood. Poultry without skin. Lean cuts of poultry and beef. Tofu. Nuts. Seeds. Dairy Low-fat or fat-free dairy products such as milk, yogurt, and cheese. The items listed above may not be a complete list of foods and beverages you can eat and drink. Contact a dietitian for more information. What foods should I avoid? Fruits Fruits canned with syrup. Vegetables Canned vegetables. Frozen vegetables with butter or cream sauce. Grains Refined white flour and flour products such as bread, pasta, snack foods, and cereals. Avoid all processed foods. Meats and other proteins Fatty cuts of meat. Poultry with skin. Breaded or fried meats. Processed meat. Avoid saturated fats. Dairy Full-fat yogurt, cheese, or milk. Beverages Sweetened drinks, such as soda or iced tea. The items listed above may not be a complete list of foods and beverages you should avoid. Contact a dietitian for more information. Questions to ask a health care provider Do I need to  meet with a certified diabetes care and education specialist? Do I need to meet with a dietitian? What number can I call if I have questions? When are the best times to check my blood glucose? Where to find more information: American Diabetes Association: diabetes.org Academy of Nutrition and Dietetics: eatright.[employer] of Diabetes and Digestive and Kidney Diseases: [employer]is Association of Diabetes Care & Education Specialists: diabeteseducator.org Summary It is important to have healthy eating habits because your blood sugar (glucose) levels are greatly affected by what you eat and drink. It is important to use alcohol carefully. A healthy meal plan will help you manage your blood glucose and lower your risk of heart disease. Your health care provider may recommend that you work with a dietitian to make a meal plan that is best for you. This information is not intended to replace advice given to you by your health care provider. Make sure you discuss any questions you have with your health care provider. Document Revised: 03/06/2020 Document Reviewed: 03/06/2020 Elsevier Patient Education  [place].  Use the cream in the red box (nystatin) to your rash.

## 2021-07-09 NOTE — Progress Notes (Signed)
I,[name],acting as a Education administrator for [employer], FNP.,have documented all relevant documentation on the behalf of Sarah Brine, FNP,as directed by  Sarah Brine, FNP while in the presence of Sarah Gilbert, [place].   This visit occurred during the SARS-CoV-2 public health emergency.  Safety protocols were in place, including screening questions prior to the visit, additional usage of staff PPE, and extensive cleaning of exam room while observing appropriate contact time as indicated for disinfecting solutions.  Subjective:     Patient ID: Sarah Gilbert , female    DOB: May 25, 1946 , 75 y.o.   MRN: [id number]   Chief Complaint  Patient presents with   Diabetes   Rash     HPI  The patient is here today for a diabetes f/u. Patient also complains of a rash on her waistline. She is in speech therapy since having her stroke.   Diabetes She presents for her follow-up diabetic visit. She has type 2 diabetes mellitus. There are no hypoglycemic associated symptoms. Pertinent negatives for hypoglycemia include no dizziness or headaches. There are no diabetic associated symptoms. Pertinent negatives for diabetes include no chest pain, no fatigue, no polydipsia, no polyphagia and no polyuria. There are no hypoglycemic complications. There are no diabetic complications. Risk factors for coronary artery disease include obesity and sedentary lifestyle. When asked about current treatments, none were reported. She has not had a previous visit with a dietitian. She rarely participates in exercise. (Does not check her blood sugars) She does not see a podiatrist.Eye exam is not current.    Past Medical History:  Diagnosis Date   Anemia    Arthritis    Atrial fibrillation (HCC)    Bleeding nose    history of    ESRD (end stage renal disease) (HCC)    History of bronchitis    Hyperlipemia    Hypertension    Joint inflammation    Obesity    PONV (postoperative nausea and vomiting)    Rotator cuff  tear arthropathy of right shoulder 06/18/2015   Thyroid disease    Wears glasses      Family History  Problem Relation Age of Onset   Dementia Mother    Cancer Father      Current Outpatient Medications:    amLODipine (NORVASC) 10 MG tablet, Take 5 mg by mouth daily. , Disp: , Rfl:    apixaban (ELIQUIS) 5 MG TABS tablet, Take 1 tablet (5 mg total) by mouth 2 (two) times daily., Disp: 60 tablet, Rfl: 2   atorvastatin (LIPITOR) 40 MG tablet, Take 1 tablet (40 mg total) by mouth daily., Disp: 30 tablet, Rfl: 2   b complex-vitamin c-folic acid (NEPHRO-VITE) 0.8 MG TABS, Take 60-300 mg by mouth at bedtime., Disp: , Rfl:    bacitracin ointment, Apply to affected area daily, Disp: 28 g, Rfl: 2   furosemide (LASIX) 40 MG tablet, Take 40 mg by mouth daily., Disp: , Rfl:    magnesium gluconate (MAGONATE) 500 MG tablet, Take 500 mg by mouth 2 (two) times daily., Disp: , Rfl:    metoprolol (LOPRESSOR) 50 MG tablet, Take 50 mg by mouth 2 (two) times daily., Disp: , Rfl:    mycophenolate (CELLCEPT) 250 MG capsule, Take 500 mg by mouth 2 (two) times daily., Disp: , Rfl:    nystatin cream (MYCOSTATIN), Apply 1 application topically 2 (two) times daily., Disp: 30 g, Rfl: 2   pantoprazole (PROTONIX) 40 MG tablet, Take 40 mg by mouth daily. , Disp: ,  Rfl:    potassium chloride SA (K-DUR,KLOR-CON) 20 MEQ tablet, Take 20 mEq by mouth 2 (two) times daily., Disp: , Rfl:    tacrolimus (PROGRAF) 0.5 MG capsule, Take 0.5 mg by mouth 2 (two) times daily. Take one capsule by mouth every evening., Disp: , Rfl:    tacrolimus (PROGRAF) 1 MG capsule, Take 2 mg by mouth 2 (two) times daily. Take 2 capsules by mouth every morning and 1 capsule in the evenings., Disp: , Rfl:    Vitamin D, Ergocalciferol, (DRISDOL) 1.25 MG (50000 UNIT) CAPS capsule, TAKE 1 CAPSULE BY MOUTH 2 TIMES A WEEK, Disp: 12 capsule, Rfl: 2   Zoster Vaccine Adjuvanted (SHINGRIX) injection, Inject 0.5 mLs into the muscle once for 1 dose. Administer 2nd  dose in 2-6 months, Disp: 0.5 mL, Rfl: 0   Allergies  Allergen Reactions   Vancomycin Hives, Itching, Rash and Other (See Comments)    Blistering, swelling, and peeling of feet.  Pt denies any similar reactions around face or neck.   Iohexol      Desc: iodine contrast/per dialysis center, Onset Date: [id number]      Review of Systems  Constitutional: Negative.  Negative for fatigue.  Respiratory: Negative.    Cardiovascular: Negative.  Negative for chest pain, palpitations and leg swelling.  Gastrointestinal: Negative.   Endocrine: Negative for polydipsia, polyphagia and polyuria.  Neurological:  Negative for dizziness and headaches.  Psychiatric/Behavioral: Negative.    All other systems reviewed and are negative.   Today's Vitals   07/09/21 0838  BP: 122/80  Pulse: 65  Temp: 98.6 F (37 C)  Weight: 197 lb 3.2 oz (89.4 kg)  Height: 5' 5"  (1.651 m)   Body mass index is 32.82 kg/m.   Objective:  Physical Exam Vitals reviewed.  Constitutional:      General: She is not in acute distress.    Appearance: Normal appearance. She is obese.  Cardiovascular:     Rate and Rhythm: Normal rate and regular rhythm.     Pulses: Normal pulses.     Heart sounds: Normal heart sounds. No murmur heard. Pulmonary:     Effort: Pulmonary effort is normal. No respiratory distress.     Breath sounds: Normal breath sounds. No wheezing.  Musculoskeletal:     Right lower leg: No edema.     Left lower leg: No edema.  Skin:    General: Skin is warm and dry.     Findings: Rash (left below panus area with hyperpigmented skin) present.  Neurological:     General: No focal deficit present.     Mental Status: She is alert and oriented to person, place, and time.     Cranial Nerves: No cranial nerve deficit.     Motor: No weakness.  Psychiatric:        Mood and Affect: Mood normal.        Behavior: Behavior normal.        Thought Content: Thought content normal.        Judgment: Judgment  normal.        Assessment And Plan:     1. Abnormal glucose Comments: HgbA1c is slightly elevated at last visit, encouraged to limit intake of sugary foods and drinks.  - BMP8+eGFR - Hemoglobin A1c  2. Mixed hyperlipidemia Comments: Slightly elevated, continue current medication, tolerating well - Lipid panel  3. Vitamin D deficiency Will check vitamin D level and supplement as needed.    Also encouraged to spend 15 minutes in  the sun daily.  - VITAMIN D 25 Hydroxy (Vit-D Deficiency, Fractures)  4. Rash and nonspecific skin eruption Comments: Hyperpigmented skin to left groin area at panus, advised to use nystatin cream.  Yeast vs dermatitis  5. Class 1 obesity due to excess calories with body mass index (BMI) of 32.0 to 32.9 in adult, unspecified whether serious comorbidity present Chronic Discussed healthy diet and regular exercise options  Encouraged to exercise at least 150 minutes per week with 2 days of strength training  6. Immunization due - Zoster Vaccine Adjuvanted [facility]) injection; Inject 0.5 mLs into the muscle once for 1 dose. Administer 2nd dose in 2-6 months  Dispense: 0.5 mL; Refill: 0 - Pneumococcal polysaccharide vaccine 23-valent greater than or equal to 2yo subcutaneous/IM   Letter written for her to excuse from [employer] duty due to age greater than 72.   Patient was given opportunity to ask questions. Patient verbalized understanding of the plan and was able to repeat key elements of the plan. All questions were answered to their satisfaction.  Sarah Brine, FNP   I, Sarah Brine, FNP, have reviewed all documentation for this visit. The documentation on 07/09/21 for the exam, diagnosis, procedures, and orders are all accurate and complete.   IF YOU HAVE BEEN REFERRED TO A SPECIALIST, IT MAY TAKE 1-2 WEEKS TO SCHEDULE/PROCESS THE REFERRAL. IF YOU HAVE NOT HEARD FROM US/SPECIALIST IN TWO WEEKS, PLEASE GIVE [name] A CALL AT [phone].   THE PATIENT IS  ENCOURAGED TO PRACTICE SOCIAL DISTANCING DUE TO THE COVID-19 PANDEMIC.

## 2021-07-09 NOTE — Therapy (Signed)
[facility] [address] [place], [name], [address] Phone: [phone]   Fax:  [phone]  Speech Language Pathology Treatment  Patient Details  Name: Sarah Gilbert MRN: [id number] Date of Birth: [birth year] Referring Provider (SLP): Frann Rider NP   Encounter Date: 07/09/2021   End of Session - 07/09/21 1044     Visit Number 2    Number of Visits 17    Date for SLP Re-Evaluation 09/01/21    SLP Start Time 6734    SLP Stop Time  1100    SLP Time Calculation (min) 39 min    Activity Tolerance Patient tolerated treatment well             Past Medical History:  Diagnosis Date   Anemia    Arthritis    Atrial fibrillation ([place])    Bleeding nose    history of    ESRD (end stage renal disease) ([facility])    History of bronchitis    Hyperlipemia    Hypertension    Joint inflammation    Obesity    PONV (postoperative nausea and vomiting)    Rotator cuff tear arthropathy of right shoulder 06/18/2015   Thyroid disease    Wears glasses     Past Surgical History:  Procedure Laterality Date   ABDOMINAL HYSTERECTOMY     APPENDECTOMY     CARDIAC CATHETERIZATION N/A 04/18/2015   Procedure: Left Heart Cath and Coronary Angiography;  Surgeon: Charolette Forward, MD;  Location: [facility];  Service: Cardiovascular;  Laterality: N/A;   CHOLECYSTECTOMY     COLONOSCOPY     IR CT HEAD LTD  01/07/2020   IR PERCUTANEOUS ART THROMBECTOMY/INFUSION INTRACRANIAL INC DIAG ANGIO  01/07/2020   IR US GUIDE VASC ACCESS RIGHT  01/07/2020   [facility]PLANT     [facility]une [facility]   RADIOLOGY WITH ANESTHESIA N/A 01/07/2020   Procedure: IR WITH ANESTHESIA;  Surgeon: Radiologist, Medication, MD;  Location: [facility];  Service: Radiology;  Laterality: N/A;   TONSILLECTOMY     TOTAL SHOULDER ARTHROPLASTY Right 06/18/2015   Procedure: RIGHT TOTAL SHOULDER ARTHROPLASTY;  Surgeon: Marchia Bond, MD;  Location: [facility];  Service:  Orthopedics;  Laterality: Right;    There were no vitals filed for this visit.   Subjective Assessment - 07/09/21 1043     Subjective "I just want to be able to remain consistent and not forget things."    Patient is accompained by: Family member    Currently in Pain? No/denies                   ADULT SLP TREATMENT - 07/09/21 1051       General Information   Behavior/Cognition Alert;Cooperative      Treatment Provided   Treatment provided Cognitive-Linquistic      Cognitive-Linquistic Treatment   Treatment focused on Cognition    Skilled Treatment SLP began training on attention strategies (icommunicate). Pt reports she could benefit from creating a to-do list and crossing items off her list to assist with remembering what she and had not completed. SLP edu this also helps take the additional load from her attention skills. SLP to complete 2nd page from strategies from [place] next session (in patient folder). Pt to review page 1 of strategies for HEP.      Assessment / Recommendations / Plan   Plan Continue with current plan of care      Progression Toward Goals   Progression toward  goals Progressing toward goals                SLP Short Term Goals - 07/02/21 1200       SLP SHORT TERM GOAL #1   Title Pt will recall 3 memory strategies to increase recall of personally-relevant, important information at home by the end of 4 weeks given minA verbal cues.    Time 4    Period Weeks    Status New    Target Date 07/30/21      SLP SHORT TERM GOAL #2   Title Pt will recall 3 attention strategies for optimization of focus during conversations to decrease cognitive fatigue and increase recall of important information given minA verbal cues.    Time 4    Period Weeks    Status New    Target Date 07/30/21              SLP Long Term Goals - 07/02/21 1203       SLP LONG TERM GOAL #1   Title Pt will report successful use of 2+ memory strategies to  increase recall of personally-relevant, important information at home.    Time 8    Period Weeks    Status New    Target Date 08/27/21      SLP LONG TERM GOAL #2   Title Pt will report successful use of 2+ attention strategies for optimization of focus during conversations to increase recall of verbal/written information.    Time 8    Period Weeks    Status New    Target Date 08/27/21              Plan - 07/09/21 1048     Clinical Impression Statement See tx note. Pt reported, "I've never really known to be aware of these things, so I will try to be more aware at home to see if that is something that impacts me." She reports she has difficulty retaining information in her head and asks people to repeat information. SLP rec continued ST services to target strategies to support memory and attention skills to maximize functional independence. Cont with current POC.    Speech Therapy Frequency 2x / week    Duration 8 weeks    Treatment/Interventions Compensatory strategies;Cueing hierarchy;Functional tasks;Patient/family education;Environmental controls;Cognitive reorganization;Compensatory techniques;Internal/external aids;SLP instruction and feedback    Potential to Achieve Goals Good    Consulted and Agree with Plan of Care Patient;Family member/caregiver    Family Member Consulted [name]             Patient will benefit from skilled therapeutic intervention in order to improve the following deficits and impairments:   Cognitive communication deficit    Problem List Patient Active Problem List   Diagnosis Date Noted   PAD (peripheral artery disease) ([facility]) 12/04/2020   Acute ischemic stroke ([facility]) - R MCA embolic from AF off AC s/p thrombectomy 01/07/2020   Abnormal glucose 12/29/2018   Mixed hyperlipidemia 09/29/2018   Lipoma of back 09/29/2018   Rotator cuff tear arthropathy of right shoulder 06/18/2015   S/p reverse total shoulder arthroplasty 06/18/2015    A-fib ([facility]) 05/01/2015   Chronic anticoagulation 05/01/2015   Right foot pain 03/25/2015   Shingles (herpes zoster) polyneuropathy 02/06/2014   Constipation 01/27/2012   Hypercholesteremia 01/27/2012   Hypertension, essential 01/27/2012   Immunosuppression ([facility]) 01/27/2012   Obesity 01/27/2012    Rosann Auerbach [place], [place] 07/09/2021, 11:28 AM  [facility] [phone]  [place], [name], [address] Phone: [phone]   Fax:  [phone]   Name: Sarah Gilbert MRN: [id number] Date of Birth: 25-Oct-1945

## 2021-07-10 LAB — BMP8+EGFR
BUN/Creatinine Ratio: 14 (ref 12–28)
BUN: 12 mg/dL (ref 8–27)
CO2: 25 mmol/L (ref 20–29)
Calcium: 10.2 mg/dL (ref 8.7–10.3)
Chloride: 102 mmol/L (ref 96–106)
Creatinine, Ser: 0.88 mg/dL (ref 0.57–1.00)
Glucose: 98 mg/dL (ref 70–99)
Potassium: 3.8 mmol/L (ref 3.5–5.2)
Sodium: 144 mmol/L (ref 134–144)
eGFR: 69 mL/min/{1.73_m2} (ref >59)

## 2021-07-10 LAB — HEMOGLOBIN A1C
Est. average glucose Bld gHb Est-mCnc: 120 mg/dL
Hgb A1c MFr Bld: 5.8 % — ABNORMAL HIGH (ref 4.8–5.6)

## 2021-07-10 LAB — LIPID PANEL
Chol/HDL Ratio: 2.1 ratio (ref 0.0–4.4)
Cholesterol, Total: 179 mg/dL (ref 100–199)
HDL: 87 mg/dL (ref >39)
LDL Chol Calc (NIH): 83 mg/dL (ref 0–99)
Triglycerides: 41 mg/dL (ref 0–149)
VLDL Cholesterol Cal: 9 mg/dL (ref 5–40)

## 2021-07-10 LAB — VITAMIN D 25 HYDROXY (VIT D DEFICIENCY, FRACTURES): Vit D, 25-Hydroxy: 26.8 ng/mL — ABNORMAL LOW (ref 30.0–100.0)

## 2021-07-14 ENCOUNTER — Encounter: Payer: Self-pay | Admitting: Speech Pathology

## 2021-07-14 ENCOUNTER — Other Ambulatory Visit: Payer: Self-pay

## 2021-07-14 ENCOUNTER — Ambulatory Visit: Payer: Medicare Other | Admitting: Speech Pathology

## 2021-07-14 DIAGNOSIS — R41841 Cognitive communication deficit: Secondary | ICD-10-CM | POA: Diagnosis not present

## 2021-07-14 NOTE — Therapy (Signed)
[facility] [address] [place], [name], [address] Phone: [phone]   Fax:  [phone]  Speech Language Pathology Treatment  Patient Details  Name: Sarah Gilbert MRN: [id number] Date of Birth: 1946-08-05 Referring Provider (SLP): Frann Rider NP   Encounter Date: 07/14/2021   End of Session - 07/14/21 1101     Visit Number 3    Number of Visits 17    Date for SLP Re-Evaluation 09/01/21    SLP Start Time 1017    SLP Stop Time  1100    SLP Time Calculation (min) 43 min    Activity Tolerance Patient tolerated treatment well             Past Medical History:  Diagnosis Date   Anemia    Arthritis    Atrial fibrillation ([facility])    Bleeding nose    history of    ESRD (end stage renal disease) ([facility])    History of bronchitis    Hyperlipemia    Hypertension    Joint inflammation    Obesity    PONV (postoperative nausea and vomiting)    Rotator cuff tear arthropathy of right shoulder 06/18/2015   Thyroid disease    Wears glasses     Past Surgical History:  Procedure Laterality Date   ABDOMINAL HYSTERECTOMY     APPENDECTOMY     CARDIAC CATHETERIZATION N/A 04/18/2015   Procedure: Left Heart Cath and Coronary Angiography;  Surgeon: Charolette Forward, MD;  Location: [facility];  Service: Cardiovascular;  Laterality: N/A;   CHOLECYSTECTOMY     COLONOSCOPY     IR CT HEAD LTD  01/07/2020   IR PERCUTANEOUS ART THROMBECTOMY/INFUSION INTRACRANIAL INC DIAG ANGIO  01/07/2020   IR US GUIDE VASC ACCESS RIGHT  01/07/2020   [facility]     [facility]   RADIOLOGY WITH ANESTHESIA N/A 01/07/2020   Procedure: IR WITH ANESTHESIA;  Surgeon: Radiologist, Medication, MD;  Location: [facility];  Service: Radiology;  Laterality: N/A;   TONSILLECTOMY     TOTAL SHOULDER ARTHROPLASTY Right 06/18/2015   Procedure: RIGHT TOTAL SHOULDER ARTHROPLASTY;  Surgeon: Marchia Bond, MD;  Location: [facility];  Service:  Orthopedics;  Laterality: Right;    There were no vitals filed for this visit.   Subjective Assessment - 07/14/21 1020     Subjective "I need to right down my to do list and cross it off"    Currently in Pain? No/denies                   ADULT SLP TREATMENT - 07/14/21 1022       General Information   Behavior/Cognition Alert;Cooperative      Treatment Provided   Treatment provided Cognitive-Linquistic      Cognitive-Linquistic Treatment   Treatment focused on Cognition;Patient/family/caregiver education    Skilled Treatment Audery did not complete list of bills to be crossed off and she continues to shuffle through bills throughout the day. This week, generated strategy of making bill list and crossing off when she pays them. Also generated strategy of limiting distractions of TV, conversation, and noise. She already has supplies out of envelopes, stamps and pens out and ready. She is loosing items, including her grocery list today. Generated strategy of saying aloud where she is putting items so improve attention and mindfulness of where she is putting things. Taylore also has bills stored from years past filling a closet. Strategy of spending 15 minutes a day  shredding to help clear clutter in closet and make room for more recent bill storage.      Assessment / Recommendations / Plan   Plan Continue with current plan of care      Progression Toward Goals   Progression toward goals Progressing toward goals              SLP Education - 07/14/21 1058     Education Details compensations for attention and memory    Person(s) Educated Patient;Caregiver(s)    Methods Explanation;Demonstration;Verbal cues;Handout    Comprehension Verbalized understanding;Returned demonstration;Verbal cues required;Need further instruction              SLP Short Term Goals - 07/14/21 1101       SLP SHORT TERM GOAL #1   Title Pt will recall 3 memory strategies to increase recall  of personally-relevant, important information at home by the end of 4 weeks given minA verbal cues.    Time 4    Period Weeks    Status On-going    Target Date 07/30/21      SLP SHORT TERM GOAL #2   Title Pt will recall 3 attention strategies for optimization of focus during conversations to decrease cognitive fatigue and increase recall of important information given minA verbal cues.    Time 4    Period Weeks    Status On-going    Target Date 07/30/21              SLP Long Term Goals - 07/14/21 1101       SLP LONG TERM GOAL #1   Title Pt will report successful use of 2+ memory strategies to increase recall of personally-relevant, important information at home.    Time 8    Period Weeks    Status On-going      SLP LONG TERM GOAL #2   Title Pt will report successful use of 2+ attention strategies for optimization of focus during conversations to increase recall of verbal/written information.    Time 8    Period Weeks    Status On-going              Plan - 07/14/21 1059     Clinical Impression Statement Ongoing training for compensations for memory and attention for financial management system and to A recall where she is putting items to reduce losing things. We generated strategy for bill organization and will consider a check off log if needed. Adrionna will bring in her bill organization list next session. Continue skilled ST to maximize independence and success with IADL's    Speech Therapy Frequency 2x / week    Duration 8 weeks    Treatment/Interventions Compensatory strategies;Cueing hierarchy;Functional tasks;Patient/family education;Environmental controls;Cognitive reorganization;Compensatory techniques;Internal/external aids;SLP instruction and feedback    Potential to Achieve Goals Good             Patient will benefit from skilled therapeutic intervention in order to improve the following deficits and impairments:   Cognitive communication  deficit    Problem List Patient Active Problem List   Diagnosis Date Noted   PAD (peripheral artery disease) ([facility]) 12/04/2020   Acute ischemic stroke ([facility]) - R MCA embolic from AF off AC s/p thrombectomy 01/07/2020   Abnormal glucose 12/29/2018   Mixed hyperlipidemia 09/29/2018   Lipoma of back 09/29/2018   Rotator cuff tear arthropathy of right shoulder 06/18/2015   S/p reverse total shoulder arthroplasty 06/18/2015   A-fib ([facility]) 05/01/2015   Chronic anticoagulation 05/01/2015  Right foot pain 03/25/2015   Shingles (herpes zoster) polyneuropathy 02/06/2014   Constipation 01/27/2012   Hypercholesteremia 01/27/2012   Hypertension, essential 01/27/2012   Immunosuppression ([facility]) 01/27/2012   Obesity 01/27/2012    Akiera Allbaugh, Annye Rusk, [place] 07/14/2021, 11:02 AM  [facility] [address] [place], [name], [address] Phone: [phone]   Fax:  [phone]   Name: Dionne Knoop MRN: [id number] Date of Birth: January 26, 1946

## 2021-07-14 NOTE — Patient Instructions (Addendum)
   Keep list of to do and cross it off  Stop and Say aloud when you are putting something down to help you pay attention to where you are putting things.  With Bills, do early in the day when you are most alert   Have everything you need ready to go, stamps, envelopes, pen etc  No distractions of TV or conversation when you are paying bills  15 to 20 minutes every day shred to help clear out your closet then you can reuse your box  Ask your CPA how long you should keep bills

## 2021-07-15 ENCOUNTER — Other Ambulatory Visit: Payer: Self-pay | Admitting: Nurse Practitioner

## 2021-07-15 DIAGNOSIS — Z23 Encounter for immunization: Secondary | ICD-10-CM

## 2021-07-15 MED ORDER — SHINGRIX 50 MCG/0.5ML IM SUSR: 0.5000 mL | Freq: Once | INTRAMUSCULAR | 0 refills | Status: AC

## 2021-07-16 ENCOUNTER — Encounter: Payer: Self-pay | Admitting: Speech Pathology

## 2021-07-16 ENCOUNTER — Other Ambulatory Visit: Payer: Self-pay

## 2021-07-16 ENCOUNTER — Ambulatory Visit: Payer: Medicare Other | Admitting: Speech Pathology

## 2021-07-16 DIAGNOSIS — R41841 Cognitive communication deficit: Secondary | ICD-10-CM

## 2021-07-16 NOTE — Therapy (Signed)
[facility] [address] [place], [name], [address] Phone: [phone]   Fax:  [phone]  Speech Language Pathology Treatment  Patient Details  Name: Sarah Gilbert MRN: [id number] Date of Birth: [birth year] Referring Provider (SLP): Frann Rider NP   Encounter Date: 07/16/2021   End of Session - 07/16/21 1133     Visit Number 4    Number of Visits 17    Date for SLP Re-Evaluation 09/01/21    SLP Start Time 0931    SLP Stop Time  1020    SLP Time Calculation (min) 49 min    Activity Tolerance Patient tolerated treatment well             Past Medical History:  Diagnosis Date   Anemia    Arthritis    Atrial fibrillation ([place])    Bleeding nose    history of    ESRD (end stage renal disease) ([place])    History of bronchitis    Hyperlipemia    Hypertension    Joint inflammation    Obesity    PONV (postoperative nausea and vomiting)    Rotator cuff tear arthropathy of right shoulder 06/18/2015   Thyroid disease    Wears glasses     Past Surgical History:  Procedure Laterality Date   ABDOMINAL HYSTERECTOMY     APPENDECTOMY     CARDIAC CATHETERIZATION N/A 04/18/2015   Procedure: Left Heart Cath and Coronary Angiography;  Surgeon: Charolette Forward, MD;  Location: [facility];  Service: Cardiovascular;  Laterality: N/A;   CHOLECYSTECTOMY     COLONOSCOPY     IR CT HEAD LTD  01/07/2020   IR PERCUTANEOUS ART THROMBECTOMY/INFUSION INTRACRANIAL INC DIAG ANGIO  01/07/2020   IR US GUIDE VASC ACCESS RIGHT  01/07/2020   [facility]     june [facility]   RADIOLOGY WITH ANESTHESIA N/A 01/07/2020   Procedure: IR WITH ANESTHESIA;  Surgeon: Radiologist, Medication, MD;  Location: [facility];  Service: Radiology;  Laterality: N/A;   TONSILLECTOMY     TOTAL SHOULDER ARTHROPLASTY Right 06/18/2015   Procedure: RIGHT TOTAL SHOULDER ARTHROPLASTY;  Surgeon: Marchia Bond, MD;  Location: [facility];  Service:  Orthopedics;  Laterality: Right;    There were no vitals filed for this visit.   Subjective Assessment - 07/16/21 0935     Subjective "I need to try and remember to write that to-do list."    Currently in Pain? No/denies                   ADULT SLP TREATMENT - 07/16/21 1137       General Information   Behavior/Cognition Alert;Cooperative      Treatment Provided   Treatment provided Cognitive-Linquistic      Cognitive-Linquistic Treatment   Treatment focused on Cognition;Patient/family/caregiver education    Skilled Treatment Pt was unable to recall attention strategies discussed last session with/without visual of handout; however, she was able to recall we had discussed creating "to-do" lists to assist with organization. As discussed with previous therapist, pt  brought in her bills and Water engineer. SLP and pt discussed different ways to try organizing her bills to increase efficiency.  Pt decided on having two separate bags for bills re: creating a bill list and checking off bills that have been paid and/or 1 bag for "bills that have been paid" and 1 bag for "bills that have NOT been paid". At the end of the month, SLP suggested pt shred  the bills to decrease clutter at home. Pt was unable to tell SLP why she decides to keep her bills reporting "I've just always done that"; however, pt reported being open to new ideas of organization/storage. SLP reviewed attention strategies with pt from last session. Pt reported she would like to try a "to do list". SLP suggested keeping it in a small journal  vs. notepad to keep all information protected.      Assessment / Recommendations / Plan   Plan Continue with current plan of care      Progression Toward Goals   Progression toward goals Progressing toward goals                SLP Short Term Goals - 07/14/21 1101       SLP SHORT TERM GOAL #1   Title Pt will recall 3 memory strategies to increase recall of  personally-relevant, important information at home by the end of 4 weeks given minA verbal cues.    Time 4    Period Weeks    Status On-going    Target Date 07/30/21      SLP SHORT TERM GOAL #2   Title Pt will recall 3 attention strategies for optimization of focus during conversations to decrease cognitive fatigue and increase recall of important information given minA verbal cues.    Time 4    Period Weeks    Status On-going    Target Date 07/30/21              SLP Long Term Goals - 07/14/21 1101       SLP LONG TERM GOAL #1   Title Pt will report successful use of 2+ memory strategies to increase recall of personally-relevant, important information at home.    Time 8    Period Weeks    Status On-going      SLP LONG TERM GOAL #2   Title Pt will report successful use of 2+ attention strategies for optimization of focus during conversations to increase recall of verbal/written information.    Time 8    Period Weeks    Status On-going              Plan - 07/16/21 1134     Clinical Impression Statement Ongoing training for compensations for memory and attention for financial management system and to A recall where she is putting items to reduce losing things. We generated strategy for bill and "to-do list" organization this session. Continue skilled ST to maximize independence and success with IADL's    Speech Therapy Frequency 2x / week    Duration 8 weeks    Treatment/Interventions Compensatory strategies;Cueing hierarchy;Functional tasks;Patient/family education;Environmental controls;Cognitive reorganization;Compensatory techniques;Internal/external aids;SLP instruction and feedback    Potential to Achieve Goals Good    Consulted and Agree with Plan of Care Patient;Family member/caregiver    Family Member Consulted [name]             Patient will benefit from skilled therapeutic intervention in order to improve the following deficits and impairments:    Cognitive communication deficit    Problem List Patient Active Problem List   Diagnosis Date Noted   PAD (peripheral artery disease) ([facility]) 12/04/2020   Acute ischemic stroke ([facility]) - R MCA embolic from AF off AC s/p thrombectomy 01/07/2020   Abnormal glucose 12/29/2018   Mixed hyperlipidemia 09/29/2018   Lipoma of back 09/29/2018   Rotator cuff tear arthropathy of right shoulder 06/18/2015   S/p reverse total shoulder  arthroplasty 06/18/2015   A-fib ([facility]) 05/01/2015   Chronic anticoagulation 05/01/2015   Right foot pain 03/25/2015   Shingles (herpes zoster) polyneuropathy 02/06/2014   Constipation 01/27/2012   Hypercholesteremia 01/27/2012   Hypertension, essential 01/27/2012   Immunosuppression ([place]) 01/27/2012   Obesity 01/27/2012    Verdene Lennert, [place] 07/16/2021, 11:45 AM  [facility] [name] [place], [name], [address] Phone: [phone]   Fax:  [phone]   Name: Sarah Gilbert MRN: [id number] Date of Birth: [birth year]

## 2021-07-21 ENCOUNTER — Other Ambulatory Visit: Payer: Self-pay

## 2021-07-21 ENCOUNTER — Ambulatory Visit: Payer: Medicare Other | Attending: Nurse Practitioner

## 2021-07-21 DIAGNOSIS — R41841 Cognitive communication deficit: Secondary | ICD-10-CM | POA: Insufficient documentation

## 2021-07-21 NOTE — Patient Instructions (Addendum)
Put your "bill pay" system in place tonight so you know those bills are paid. We will be asking about this on Wednesday.   Write down when your bills are due on your kitchen calendar. Write "paid" or check off when you have paid them.   Keep a small notebook (or use your pink calendar) to keep in your purse. Write down daily events and/or information you have shared with your uncle. He can tell you to look into your notebook instead of telling you the information.  When you re-read your therapy materials, highlight the important or most helpful strategies. Write down 3 strategies (in your words) that have been most helpful from the past few speech therapy sessions. You will bring this to next speech therapy session.   Read Bible related to Sunday school lesson for 5 minutes each day. Re-read multiple times and/or take notes to aid your recall.

## 2021-07-21 NOTE — Therapy (Signed)
[facility] [address] [place], [name], [address] Phone: [phone]   Fax:  [phone]  Speech Language Pathology Treatment  Patient Details  Name: Sarah Gilbert MRN: [id number] Date of Birth: [birth year] Referring Provider (SLP): Frann Rider NP   Encounter Date: 07/21/2021   End of Session - 07/21/21 3614     Visit Number 5    Number of Visits 17    Date for SLP Re-Evaluation 09/01/21    Authorization Type UHC Medicare    SLP Start Time 0930    SLP Stop Time  4315    SLP Time Calculation (min) 45 min    Activity Tolerance Patient tolerated treatment well             Past Medical History:  Diagnosis Date   Anemia    Arthritis    Atrial fibrillation ([facility])    Bleeding nose    history of    ESRD (end stage renal disease) ([facility])    History of bronchitis    Hyperlipemia    Hypertension    Joint inflammation    Obesity    PONV (postoperative nausea and vomiting)    Rotator cuff tear arthropathy of right shoulder 06/18/2015   Thyroid disease    Wears glasses     Past Surgical History:  Procedure Laterality Date   ABDOMINAL HYSTERECTOMY     APPENDECTOMY     CARDIAC CATHETERIZATION N/A 04/18/2015   Procedure: Left Heart Cath and Coronary Angiography;  Surgeon: Charolette Forward, MD;  Location: [facility];  Service: Cardiovascular;  Laterality: N/A;   CHOLECYSTECTOMY     COLONOSCOPY     IR CT HEAD LTD  01/07/2020   IR PERCUTANEOUS ART THROMBECTOMY/INFUSION INTRACRANIAL INC DIAG ANGIO  01/07/2020   IR US GUIDE VASC ACCESS RIGHT  01/07/2020   [facility]PLANT     [facility]une 2011-[facility]   RADIOLOGY WITH ANESTHESIA N/A 01/07/2020   Procedure: IR WITH ANESTHESIA;  Surgeon: Radiologist, Medication, MD;  Location: [facility];  Service: Radiology;  Laterality: N/A;   TONSILLECTOMY     TOTAL SHOULDER ARTHROPLASTY Right 06/18/2015   Procedure: RIGHT TOTAL SHOULDER ARTHROPLASTY;  Surgeon: Marchia Bond, MD;   Location: [facility];  Service: Orthopedics;  Laterality: Right;    There were no vitals filed for this visit.   Subjective Assessment - 07/21/21 0925     Subjective "I'm doing fine"    Patient is accompained by: Family member    Currently in Pain? No/denies                   ADULT SLP TREATMENT - 07/21/21 0921       General Information   Behavior/Cognition Alert;Cooperative      Treatment Provided   Treatment provided Cognitive-Linquistic      Cognitive-Linquistic Treatment   Treatment focused on Cognition;Patient/family/caregiver education    Skilled Treatment Limited recall of educated cognitive strategies reported by patient. SLP re-educated patient on some previous recommended ST strategies, including using to-do lists, using bill list, and keeping daily journal. Pt reportedly reviews therapy materials each day but has difficulty retaining the information. SLP generated strategy to highlight and rewrite 3 helpful cognitive strategies in her words to bring to next ST session to aid carryover and hopefully implementation at home. SLP provided written handout of today's recommendations/HEP with visual cues to aid attention. Her uncle reported usual repetition of previously stated information. Pt endorsed she should write down information, which may be  helpful. SLP suggested writing down daily journal in pink calendar she keeps in her purse. Pt is utilizing multiple calendars at home. SLP generated strategy to write down bill due dates on calendar and possibly consolidating information onto 1-2 calendars to aid recall.      Assessment / Recommendations / Plan   Plan Continue with current plan of care      Progression Toward Goals   Progression toward goals Progressing toward goals              SLP Education - 07/21/21 1027     Education Details HEP, recommendations/strategies    Person(s) Educated Patient;Caregiver(s)    Methods Explanation;Demonstration;Handout;Verbal  cues    Comprehension Verbalized understanding;Returned demonstration;Verbal cues required;Need further instruction              SLP Short Term Goals - 07/14/21 1101       SLP SHORT TERM GOAL #1   Title Pt will recall 3 memory strategies to increase recall of personally-relevant, important information at home by the end of 4 weeks given minA verbal cues.    Time 4    Period Weeks    Status On-going    Target Date 07/30/21      SLP SHORT TERM GOAL #2   Title Pt will recall 3 attention strategies for optimization of focus during conversations to decrease cognitive fatigue and increase recall of important information given minA verbal cues.    Time 4    Period Weeks    Status On-going    Target Date 07/30/21              SLP Long Term Goals - 07/21/21 0924       SLP LONG TERM GOAL #1   Title Pt will report successful use of 2+ memory strategies to increase recall of personally-relevant, important information at home.    Time 8    Period Weeks    Status On-going    Target Date 08/27/21      SLP LONG TERM GOAL #2   Title Pt will report successful use of 2+ attention strategies for optimization of focus during conversations to increase recall of verbal/written information.    Time 8    Period Weeks    Status On-going    Target Date 08/27/21              Plan - 07/21/21 9485     Clinical Impression Statement Ongoing training for compensations for memory and attention for financial management system and to A recall of pertinent information. We generated strategies to aid retention of SLP recommendations/cognitive strategies and implementation of bill pay system at home. Continue skilled ST to maximize independence and success with IADL's.    Speech Therapy Frequency 2x / week    Duration 8 weeks    Treatment/Interventions Compensatory strategies;Cueing hierarchy;Functional tasks;Patient/family education;Environmental controls;Cognitive reorganization;Compensatory  techniques;Internal/external aids;SLP instruction and feedback    Potential to Achieve Goals Good    Consulted and Agree with Plan of Care Patient;Family member/caregiver    Family Member Consulted [name]             Patient will benefit from skilled therapeutic intervention in order to improve the following deficits and impairments:   Cognitive communication deficit    Problem List Patient Active Problem List   Diagnosis Date Noted   PAD (peripheral artery disease) ([facility]) 12/04/2020   Acute ischemic stroke ([facility]) - R MCA embolic from AF off AC s/p thrombectomy 01/07/2020  Abnormal glucose 12/29/2018   Mixed hyperlipidemia 09/29/2018   Lipoma of back 09/29/2018   Rotator cuff tear arthropathy of right shoulder 06/18/2015   S/p reverse total shoulder arthroplasty 06/18/2015   A-fib ([facility]) 05/01/2015   Chronic anticoagulation 05/01/2015   Right foot pain 03/25/2015   Shingles (herpes zoster) polyneuropathy 02/06/2014   Constipation 01/27/2012   Hypercholesteremia 01/27/2012   Hypertension, essential 01/27/2012   Immunosuppression ([place] [place]) 01/27/2012   Obesity 01/27/2012    Alinda Deem, [place] 07/21/2021, 10:29 AM  [facility] [address] [place], [name], [address] Phone: [phone]   Fax:  [phone]   Name: Sarah Gilbert MRN: [id number] Date of Birth: 05-18-1946

## 2021-07-23 ENCOUNTER — Other Ambulatory Visit: Payer: Self-pay

## 2021-07-23 ENCOUNTER — Ambulatory Visit: Payer: Medicare Other | Admitting: Speech Pathology

## 2021-07-23 DIAGNOSIS — R41841 Cognitive communication deficit: Secondary | ICD-10-CM

## 2021-07-23 NOTE — Therapy (Signed)
[facility] [address] [place], [name], [address] Phone: [phone]   Fax:  [phone]  Speech Language Pathology Treatment  Patient Details  Name: Sarah Gilbert MRN: [id number] Date of Birth: 1946-01-16 Referring Provider (SLP): Frann Rider NP   Encounter Date: 07/23/2021   End of Session - 07/23/21 1054     Visit Number 6    Number of Visits 17    Date for SLP Re-Evaluation 09/01/21    Authorization Type UHC Medicare    SLP Start Time 1015    SLP Stop Time  3151    SLP Time Calculation (min) 32 min             Past Medical History:  Diagnosis Date   Anemia    Arthritis    Atrial fibrillation ([facility])    Bleeding nose    history of    ESRD (end stage renal disease) ([facility])    History of bronchitis    Hyperlipemia    Hypertension    Joint inflammation    Obesity    PONV (postoperative nausea and vomiting)    Rotator cuff tear arthropathy of right shoulder 06/18/2015   Thyroid disease    Wears glasses     Past Surgical History:  Procedure Laterality Date   ABDOMINAL HYSTERECTOMY     APPENDECTOMY     CARDIAC CATHETERIZATION N/A 04/18/2015   Procedure: Left Heart Cath and Coronary Angiography;  Surgeon: Charolette Forward, MD;  Location: [facility];  Service: Cardiovascular;  Laterality: N/A;   CHOLECYSTECTOMY     COLONOSCOPY     IR CT HEAD LTD  01/07/2020   IR PERCUTANEOUS ART THROMBECTOMY/INFUSION INTRACRANIAL INC DIAG ANGIO  01/07/2020   IR US GUIDE VASC ACCESS RIGHT  01/07/2020   [facility]     [facility]   RADIOLOGY WITH ANESTHESIA N/A 01/07/2020   Procedure: IR WITH ANESTHESIA;  Surgeon: Radiologist, Medication, MD;  Location: [facility];  Service: Radiology;  Laterality: N/A;   TONSILLECTOMY     TOTAL SHOULDER ARTHROPLASTY Right 06/18/2015   Procedure: RIGHT TOTAL SHOULDER ARTHROPLASTY;  Surgeon: Marchia Bond, MD;  Location: [facility];  Service: Orthopedics;  Laterality: Right;     There were no vitals filed for this visit.          ADULT SLP TREATMENT - 07/23/21 1025       General Information   Behavior/Cognition Alert;Cooperative;Pleasant mood      Treatment Provided   Treatment provided Cognitive-Linquistic      Cognitive-Linquistic Treatment   Treatment focused on Cognition;Patient/family/caregiver education    Skilled Treatment Sarah Gilbert brought in her billpaying system, she keeps monthly bills in baggie with label of month.Generated strategy of writing the bills she pays on the sheet where she lables the month so she can easily see which bills she has paid, rather than repeatedly shuffling through her bills. Sarah Gilbert uses notebook to successfully keep a running account balance. She has gotten a journal to write down things she needs to remember and things she is asking her uncle about repeatedly and will start using this this week. She is to ask her accountant how long she needs to keep her bills and start shredding bills in her closet. Trained pt in environmental strategies and compensations to A in processing, attending and recalling conversations. See pt instructtions      Assessment / Recommendations / Plan   Plan Continue with current plan of care  Progression Toward Goals   Progression toward goals Progressing toward goals              SLP Education - 07/23/21 1050     Education Details use the label in the baggie to write down bills you have paid, initate journal    Person(s) Educated Patient;Caregiver(s)    Methods Explanation;Demonstration;Verbal cues;Handout    Comprehension Verbalized understanding;Returned demonstration;Verbal cues required              SLP Short Term Goals - 07/23/21 1052       SLP SHORT TERM GOAL #1   Title Pt will recall 3 memory strategies to increase recall of personally-relevant, important information at home by the end of 4 weeks given minA verbal cues.    Time 3    Period Weeks    Status  On-going    Target Date 07/30/21      SLP SHORT TERM GOAL #2   Title Pt will recall 3 attention strategies for optimization of focus during conversations to decrease cognitive fatigue and increase recall of important information given minA verbal cues.    Time 3    Period Weeks    Status On-going    Target Date 07/30/21              SLP Long Term Goals - 07/23/21 1052       SLP LONG TERM GOAL #1   Title Pt will report successful use of 2+ memory strategies to increase recall of personally-relevant, important information at home.    Time 7    Period Weeks    Status On-going      SLP LONG TERM GOAL #2   Title Pt will report successful use of 2+ attention strategies for optimization of focus during conversations to increase recall of verbal/written information.    Time 7    Period Weeks    Status On-going              Plan - 07/23/21 1051     Clinical Impression Statement Ongoing training for compensations for memory and attention for financial management system and to A recall of pertinent information and to manage finances without second guessing which bills she has alredy paing. We generated strategies to aid retention of SLP recommendations/cognitive strategies and implementation of bill pay system at home. Continue skilled ST to maximize independence and success with IADL's.    Speech Therapy Frequency 2x / week    Duration 8 weeks    Treatment/Interventions Compensatory strategies;Cueing hierarchy;Functional tasks;Patient/family education;Environmental controls;Cognitive reorganization;Compensatory techniques;Internal/external aids;SLP instruction and feedback    Potential to Achieve Goals Good             Patient will benefit from skilled therapeutic intervention in order to improve the following deficits and impairments:   Cognitive communication deficit    Problem List Patient Active Problem List   Diagnosis Date Noted   PAD (peripheral artery disease)  ([facility]) 12/04/2020   Acute ischemic stroke ([facility]) - R MCA embolic from AF off AC s/p thrombectomy 01/07/2020   Abnormal glucose 12/29/2018   Mixed hyperlipidemia 09/29/2018   Lipoma of back 09/29/2018   Rotator cuff tear arthropathy of right shoulder 06/18/2015   S/p reverse total shoulder arthroplasty 06/18/2015   A-fib ([place]) 05/01/2015   Chronic anticoagulation 05/01/2015   Right foot pain 03/25/2015   Shingles (herpes zoster) polyneuropathy 02/06/2014   Constipation 01/27/2012   Hypercholesteremia 01/27/2012   Hypertension, essential 01/27/2012   Immunosuppression ([facility] [facility]) 01/27/2012  Obesity 01/27/2012    Legion Discher, Sarah Gilbert, CCC-SLP 07/23/2021, 10:55 AM  [facility] [address] [place], [name], [address] Phone: [phone]   Fax:  [phone]   Name: Madalaine Portier MRN: [id number] Date of Birth: 1946/08/02

## 2021-07-23 NOTE — Patient Instructions (Signed)
   This week - write down the bills you paid the 1st  of the month on the label in your ziplock baggie  Start journal and to do list  When you are talking in an important conversation:   Get the persons attention before you speak  Use eye contact and face the person you are speaking to  Be in close proximity to the person you are speaking to  Turn down any noise in the environment such as the TV, walk away from loud appliances, air conditioners, fans, dish washers etc  In large gatherings, sit or stay on the side not the center of the room to reduce noise distractions

## 2021-07-28 ENCOUNTER — Other Ambulatory Visit: Payer: Self-pay

## 2021-07-28 ENCOUNTER — Ambulatory Visit: Payer: Medicare Other

## 2021-07-28 DIAGNOSIS — R41841 Cognitive communication deficit: Secondary | ICD-10-CM | POA: Diagnosis not present

## 2021-07-28 NOTE — Patient Instructions (Addendum)
Please bring tablet and journal to next speech therapy session    The journal will serve as your "memory notebook." You will write down anything you need to do, today's events or things you have already done, or questions you have asked Hubbard [place].

## 2021-07-28 NOTE — Therapy (Signed)
[facility] [address] [place], [name], [address] Phone: [phone]   Fax:  [phone]  Speech Language Pathology Treatment  Patient Details  Name: Sarah Gilbert MRN: [id number] Date of Birth: [birth year] Referring Provider (SLP): Frann Rider NP   Encounter Date: 07/28/2021   End of Session - 07/28/21 0958     Visit Number 7    Number of Visits 17    Date for SLP Re-Evaluation 09/01/21    Authorization Type UHC Medicare    SLP Start Time 0932    SLP Stop Time  1012    SLP Time Calculation (min) 40 min    Activity Tolerance Patient tolerated treatment well             Past Medical History:  Diagnosis Date   Anemia    Arthritis    Atrial fibrillation ([employer])    Bleeding nose    history of    ESRD (end stage renal disease) ([place])    History of bronchitis    Hyperlipemia    Hypertension    Joint inflammation    Obesity    PONV (postoperative nausea and vomiting)    Rotator cuff tear arthropathy of right shoulder 06/18/2015   Thyroid disease    Wears glasses     Past Surgical History:  Procedure Laterality Date   ABDOMINAL HYSTERECTOMY     APPENDECTOMY     CARDIAC CATHETERIZATION N/A 04/18/2015   Procedure: Left Heart Cath and Coronary Angiography;  Surgeon: Charolette Forward, MD;  Location: [facility];  Service: Cardiovascular;  Laterality: N/A;   CHOLECYSTECTOMY     COLONOSCOPY     IR CT HEAD LTD  01/07/2020   IR PERCUTANEOUS ART THROMBECTOMY/INFUSION INTRACRANIAL INC DIAG ANGIO  01/07/2020   IR US GUIDE VASC ACCESS RIGHT  01/07/2020   [facility]PLANT     june 2011-[facility]   RADIOLOGY WITH ANESTHESIA N/A 01/07/2020   Procedure: IR WITH ANESTHESIA;  Surgeon: Radiologist, Medication, MD;  Location: [facility];  Service: Radiology;  Laterality: N/A;   TONSILLECTOMY     TOTAL SHOULDER ARTHROPLASTY Right 06/18/2015   Procedure: RIGHT TOTAL SHOULDER ARTHROPLASTY;  Surgeon: Marchia Bond, MD;   Location: [facility];  Service: Orthopedics;  Laterality: Right;    There were no vitals filed for this visit.   Subjective Assessment - 07/28/21 0934     Subjective "I'm feeling rattled"    Currently in Pain? No/denies                   ADULT SLP TREATMENT - 07/28/21 0935       General Information   Behavior/Cognition Alert;Cooperative;Pleasant mood      Treatment Provided   Treatment provided Cognitive-Linquistic      Cognitive-Linquistic Treatment   Treatment focused on Cognition;Patient/family/caregiver education    Skilled Treatment Pt reported feeling "frazzled" as family member hospitalized this morning. SLP reviewed previous ST session, in which pt returned with bills x2. Pt wrote date, account number, and amount on outside of letter and in tablet, which pt forgot to bring tablet today. SLP wrote note to recall to bring next session. Pt inquired about what she was supposed to write in journal. SLP provided education and handout to guide thought process for journal as memory aid. Pt required occasional extra processing time and visual cues to formulate tasks for to-do list. SLP provided intermittent cues to be more specific to aid recall. SLP provided journal prompts from the whole  week to target carryover and implementation of memory aids.      Assessment / Recommendations / Plan   Plan Continue with current plan of care      Progression Toward Goals   Progression toward goals Progressing toward goals              SLP Education - 07/28/21 0951     Education Details purpose of journal, journal prompts    Person(s) Educated Patient    Methods Explanation;Demonstration;Verbal cues;Handout    Comprehension Verbalized understanding;Returned demonstration;Need further instruction              SLP Short Term Goals - 07/28/21 1003       SLP SHORT TERM GOAL #1   Title Pt will recall 3 memory strategies to increase recall of personally-relevant, important  information at home by the end of 4 weeks given minA verbal cues.    Time 2    Period Weeks    Status On-going    Target Date 07/30/21      SLP SHORT TERM GOAL #2   Title Pt will recall 3 attention strategies for optimization of focus during conversations to decrease cognitive fatigue and increase recall of important information given minA verbal cues.    Time 2    Period Weeks    Status On-going    Target Date 07/30/21              SLP Long Term Goals - 07/28/21 1003       SLP LONG TERM GOAL #1   Title Pt will report successful use of 2+ memory strategies to increase recall of personally-relevant, important information at home.    Time 6    Period Weeks    Status On-going      SLP LONG TERM GOAL #2   Title Pt will report successful use of 2+ attention strategies for optimization of focus during conversations to increase recall of verbal/written information.    Time 6    Period Weeks    Status On-going              Plan - 07/28/21 0958     Clinical Impression Statement Ongoing training for compensations for memory and attention for financial management system and to A recall of pertinent information. We generated strategies to aid retention of SLP recommendations/cognitive strategies and implementation of bill pay system at home. Continue skilled ST to maximize independence and success with IADL's.    Speech Therapy Frequency 2x / week    Duration 8 weeks    Treatment/Interventions Compensatory strategies;Cueing hierarchy;Functional tasks;Patient/family education;Environmental controls;Cognitive reorganization;Compensatory techniques;Internal/external aids;SLP instruction and feedback    Potential to Achieve Goals Good             Patient will benefit from skilled therapeutic intervention in order to improve the following deficits and impairments:   Cognitive communication deficit    Problem List Patient Active Problem List   Diagnosis Date Noted   PAD  (peripheral artery disease) ([facility]) 12/04/2020   Acute ischemic stroke ([place]) - R MCA embolic from AF off AC s/p thrombectomy 01/07/2020   Abnormal glucose 12/29/2018   Mixed hyperlipidemia 09/29/2018   Lipoma of back 09/29/2018   Rotator cuff tear arthropathy of right shoulder 06/18/2015   S/p reverse total shoulder arthroplasty 06/18/2015   A-fib ([place]) 05/01/2015   Chronic anticoagulation 05/01/2015   Right foot pain 03/25/2015   Shingles (herpes zoster) polyneuropathy 02/06/2014   Constipation 01/27/2012   Hypercholesteremia 01/27/2012  Hypertension, essential 01/27/2012   Immunosuppression ([place]) 01/27/2012   Obesity 01/27/2012    Alinda Deem, CCC-SLP 07/28/2021, 10:15 AM  [facility] [address] [place], [name], [address] Phone: [phone]   Fax:  [phone]   Name: Cherell Colvin MRN: [id number] Date of Birth: [birth year]

## 2021-07-30 ENCOUNTER — Ambulatory Visit: Payer: Medicare Other

## 2021-07-30 ENCOUNTER — Other Ambulatory Visit: Payer: Self-pay

## 2021-07-30 DIAGNOSIS — R41841 Cognitive communication deficit: Secondary | ICD-10-CM

## 2021-07-30 NOTE — Patient Instructions (Addendum)
1) Take daily journal log out of your "therapy bag". Place daily journal log beside your lounge chair so you can see it every day.  Bring your "therapy bag" (canvas bag) back to every therapy session.  2) Write down on your calendar "no plans" after double checking with Hubbard Robinson so you don't have to ask him again  3) Write down your favorite tv station and type of shows that comes on them

## 2021-07-30 NOTE — Therapy (Signed)
[facility] [address] [place], [name], [address] Phone: [phone]   Fax:  [phone]  Speech Language Pathology Treatment  Patient Details  Name: Sarah Gilbert MRN: [id number] Date of Birth: 07/17/1946 Referring Provider (SLP): Frann Rider NP   Encounter Date: 07/30/2021   End of Session - 07/30/21 1015     Visit Number 8    Number of Visits 17    Date for SLP Re-Evaluation 09/01/21    Authorization Type UHC Medicare    SLP Start Time 1015    SLP Stop Time  1055    SLP Time Calculation (min) 40 min             Past Medical History:  Diagnosis Date   Anemia    Arthritis    Atrial fibrillation ([place])    Bleeding nose    history of    ESRD (end stage renal disease) ([place])    History of bronchitis    Hyperlipemia    Hypertension    Joint inflammation    Obesity    PONV (postoperative nausea and vomiting)    Rotator cuff tear arthropathy of right shoulder 06/18/2015   Thyroid disease    Wears glasses     Past Surgical History:  Procedure Laterality Date   ABDOMINAL HYSTERECTOMY     APPENDECTOMY     CARDIAC CATHETERIZATION N/A 04/18/2015   Procedure: Left Heart Cath and Coronary Angiography;  Surgeon: Charolette Forward, MD;  Location: [facility];  Service: Cardiovascular;  Laterality: N/A;   CHOLECYSTECTOMY     COLONOSCOPY     IR CT HEAD LTD  01/07/2020   IR PERCUTANEOUS ART THROMBECTOMY/INFUSION INTRACRANIAL INC DIAG ANGIO  01/07/2020   IR US GUIDE VASC ACCESS RIGHT  01/07/2020   [facility]PLANT     [facility]une 2011-[facility]   RADIOLOGY WITH ANESTHESIA N/A 01/07/2020   Procedure: IR WITH ANESTHESIA;  Surgeon: Radiologist, Medication, MD;  Location: [facility];  Service: Radiology;  Laterality: N/A;   TONSILLECTOMY     TOTAL SHOULDER ARTHROPLASTY Right 06/18/2015   Procedure: RIGHT TOTAL SHOULDER ARTHROPLASTY;  Surgeon: Marchia Bond, MD;  Location: [facility];  Service: Orthopedics;  Laterality:  Right;    There were no vitals filed for this visit.   Subjective Assessment - 07/30/21 1016     Subjective "I forgot everything"    Patient is accompained by: Family member    Currently in Pain? No/denies                   ADULT SLP TREATMENT - 07/30/21 1015       General Information   Behavior/Cognition Alert;Cooperative;Pleasant mood;Requires cueing      Treatment Provided   Treatment provided Cognitive-Linquistic      Cognitive-Linquistic Treatment   Treatment focused on Cognition;Patient/family/caregiver education    Skilled Treatment Pt stated "I forgot everything" re: therapy materials. Pt did not complete HEP due to concern for uncle's health condition. SLP educated and instructed recommendations to aid completion of HEP (daily journal). Pt able to recall 2 items from Monday's to do lists without visual. SLP provided min to mod prompting to recall other targeted tasks. SLP generated strategy for patient to write "no plans" on calendar as pt repeated asks about daily plans if date is empty. Uncle reported pt has difficulty recalling television stations. SLP wrote this down as part of HEP.      Assessment / Recommendations / Plan   Plan Continue with  current plan of care      Progression Toward Goals   Progression toward goals Progressing toward goals              SLP Education - 07/30/21 1055     Education Details HEP, recommendations    Person(s) Educated Patient;Caregiver(s)    Methods Explanation;Demonstration;Verbal cues;Handout    Comprehension Verbalized understanding;Returned demonstration;Need further instruction              SLP Short Term Goals - 07/30/21 1015       SLP SHORT TERM GOAL #1   Title Pt will recall 3 memory strategies to increase recall of personally-relevant, important information at home by the end of 4 weeks given minA verbal cues.    Time 2    Period Weeks    Status On-going    Target Date --      SLP SHORT TERM  GOAL #2   Title Pt will recall 3 attention strategies for optimization of focus during conversations to decrease cognitive fatigue and increase recall of important information given minA verbal cues.    Time 2    Period Weeks    Status On-going    Target Date --              SLP Long Term Goals - 07/30/21 1015       SLP LONG TERM GOAL #1   Title Pt will report successful use of 2+ memory strategies to increase recall of personally-relevant, important information at home.    Time 6    Period Weeks    Status On-going      SLP LONG TERM GOAL #2   Title Pt will report successful use of 2+ attention strategies for optimization of focus during conversations to increase recall of verbal/written information.    Time 6    Period Weeks    Status On-going              Plan - 07/30/21 1015     Clinical Impression Statement Ongoing training for compensations for memory and attention for financial management system and to A recall of pertinent information. We generated strategies to aid retention of SLP recommendations/cognitive strategies and implementation of bill pay system at home. Continue skilled ST to maximize independence and success with IADL's.    Speech Therapy Frequency 2x / week    Duration 8 weeks    Treatment/Interventions Compensatory strategies;Cueing hierarchy;Functional tasks;Patient/family education;Environmental controls;Cognitive reorganization;Compensatory techniques;Internal/external aids;SLP instruction and feedback    Potential to Achieve Goals Good             Patient will benefit from skilled therapeutic intervention in order to improve the following deficits and impairments:   Cognitive communication deficit    Problem List Patient Active Problem List   Diagnosis Date Noted   PAD (peripheral artery disease) ([place]) 12/04/2020   Acute ischemic stroke ([place] [facility]) - R MCA embolic from AF off AC s/p thrombectomy 01/07/2020   Abnormal glucose 12/29/2018    Mixed hyperlipidemia 09/29/2018   Lipoma of back 09/29/2018   Rotator cuff tear arthropathy of right shoulder 06/18/2015   S/p reverse total shoulder arthroplasty 06/18/2015   A-fib ([place]) 05/01/2015   Chronic anticoagulation 05/01/2015   Right foot pain 03/25/2015   Shingles (herpes zoster) polyneuropathy 02/06/2014   Constipation 01/27/2012   Hypercholesteremia 01/27/2012   Hypertension, essential 01/27/2012   Immunosuppression ([place]) 01/27/2012   Obesity 01/27/2012    Alinda Deem, CCC-SLP 07/30/2021, 11:42 AM  [facility] [address] [place], [name], [address] Phone: [phone]   Fax:  [phone]   Name: Sarah Gilbert MRN: [id number] Date of Birth: [birth year]

## 2021-08-04 ENCOUNTER — Other Ambulatory Visit: Payer: Self-pay

## 2021-08-04 ENCOUNTER — Ambulatory Visit: Payer: Medicare Other

## 2021-08-04 DIAGNOSIS — R41841 Cognitive communication deficit: Secondary | ICD-10-CM | POA: Diagnosis not present

## 2021-08-04 NOTE — Patient Instructions (Addendum)
1) Keep up writing down on your daily journal/log   2) Ask your accountant or bank how long should you keep records of past bills  3) Write down your favorite tv stations/channel/number

## 2021-08-04 NOTE — Therapy (Signed)
[facility] [address] [place], [name], [address] Phone: [phone]   Fax:  [phone]  Speech Language Pathology Treatment  Patient Details  Name: Sarah Gilbert MRN: [id number] Date of Birth: August 14, 1946 Referring Provider (SLP): Frann Rider NP   Encounter Date: 08/04/2021   End of Session - 08/04/21 0930     Visit Number 9    Number of Visits 17    Date for SLP Re-Evaluation 09/01/21    Authorization Type UHC Medicare    SLP Start Time 0930    SLP Stop Time  1000   pt request due to confliciting appointment   SLP Time Calculation (min) 30 min    Activity Tolerance Patient tolerated treatment well             Past Medical History:  Diagnosis Date   Anemia    Arthritis    Atrial fibrillation ([facility])    Bleeding nose    history of    ESRD (end stage renal disease) ([place] [facility])    History of bronchitis    Hyperlipemia    Hypertension    Joint inflammation    Obesity    PONV (postoperative nausea and vomiting)    Rotator cuff tear arthropathy of right shoulder 06/18/2015   Thyroid disease    Wears glasses     Past Surgical History:  Procedure Laterality Date   ABDOMINAL HYSTERECTOMY     APPENDECTOMY     CARDIAC CATHETERIZATION N/A 04/18/2015   Procedure: Left Heart Cath and Coronary Angiography;  Surgeon: Charolette Forward, MD;  Location: [facility];  Service: Cardiovascular;  Laterality: N/A;   CHOLECYSTECTOMY     COLONOSCOPY     IR CT HEAD LTD  01/07/2020   IR PERCUTANEOUS ART THROMBECTOMY/INFUSION INTRACRANIAL INC DIAG ANGIO  01/07/2020   IR US GUIDE VASC ACCESS RIGHT  01/07/2020   [facility]PLANT     [facility]une 2011-[facility]   RADIOLOGY WITH ANESTHESIA N/A 01/07/2020   Procedure: IR WITH ANESTHESIA;  Surgeon: Radiologist, Medication, MD;  Location: [facility];  Service: Radiology;  Laterality: N/A;   TONSILLECTOMY     TOTAL SHOULDER ARTHROPLASTY Right 06/18/2015   Procedure: RIGHT TOTAL SHOULDER  ARTHROPLASTY;  Surgeon: Marchia Bond, MD;  Location: [facility];  Service: Orthopedics;  Laterality: Right;    There were no vitals filed for this visit.   Subjective Assessment - 08/04/21 0930     Subjective "I brought (therapy bag) back"    Patient is accompained by: Family member    Currently in Pain? No/denies                   ADULT SLP TREATMENT - 08/04/21 0929       General Information   Behavior/Cognition Alert;Cooperative;Pleasant mood;Requires cueing      Treatment Provided   Treatment provided Cognitive-Linquistic      Cognitive-Linquistic Treatment   Treatment focused on Cognition;Patient/family/caregiver education    Skilled Treatment Pt completed 3/3 tasks from last session, including bring back therapy materials, writing down on calendar, and writing down favorite television shows. SLP provided clarification to write down stations as pt only wrote shows. Pt will complete this for next time. Pt somewhat utilized written aids of daily journal. SLP re-educated rationale and usage this session to aid carryover. Pt verbalized understanding. Pt requested shortened session today due to upcoming appointment.      Assessment / Recommendations / Plan   Plan Continue with current plan of care  Progression Toward Goals   Progression toward goals Progressing toward goals              SLP Education - 08/04/21 0949     Education Details HEP, recommendations    Person(s) Educated Patient;Caregiver(s)    Methods Explanation;Demonstration;Verbal cues;Handout    Comprehension Verbalized understanding;Returned demonstration;Need further instruction              SLP Short Term Goals - 08/04/21 0930       SLP SHORT TERM GOAL #1   Title Pt will recall 3 memory strategies to increase recall of personally-relevant, important information at home by the end of 4 weeks given minA verbal cues.    Time 1    Period Weeks    Status On-going      SLP SHORT TERM GOAL  #2   Title Pt will recall 3 attention strategies for optimization of focus during conversations to decrease cognitive fatigue and increase recall of important information given minA verbal cues.    Time 1    Period Weeks    Status On-going              SLP Long Term Goals - 08/04/21 0930       SLP LONG TERM GOAL #1   Title Pt will report successful use of 2+ memory strategies to increase recall of personally-relevant, important information at home.    Time 5    Period Weeks    Status On-going      SLP LONG TERM GOAL #2   Title Pt will report successful use of 2+ attention strategies for optimization of focus during conversations to increase recall of verbal/written information.    Time 5    Period Weeks    Status On-going              Plan - 08/04/21 0930     Clinical Impression Statement Ongoing training for compensations for memory and attention for financial management system and to A recall of pertinent information. We generated strategies to aid retention of SLP recommendations/cognitive strategies and implementation of bill pay system at home. Continue skilled ST to maximize independence and success with IADL's.    Speech Therapy Frequency 2x / week    Duration 8 weeks    Treatment/Interventions Compensatory strategies;Cueing hierarchy;Functional tasks;Patient/family education;Environmental controls;Cognitive reorganization;Compensatory techniques;Internal/external aids;SLP instruction and feedback    Potential to Achieve Goals Good             Patient will benefit from skilled therapeutic intervention in order to improve the following deficits and impairments:   Cognitive communication deficit    Problem List Patient Active Problem List   Diagnosis Date Noted   PAD (peripheral artery disease) ([facility]) 12/04/2020   Acute ischemic stroke ([facility]) - R MCA embolic from AF off AC s/p thrombectomy 01/07/2020   Abnormal glucose 12/29/2018   Mixed hyperlipidemia  09/29/2018   Lipoma of back 09/29/2018   Rotator cuff tear arthropathy of right shoulder 06/18/2015   S/p reverse total shoulder arthroplasty 06/18/2015   A-fib ([place]) 05/01/2015   Chronic anticoagulation 05/01/2015   Right foot pain 03/25/2015   Shingles (herpes zoster) polyneuropathy 02/06/2014   Constipation 01/27/2012   Hypercholesteremia 01/27/2012   Hypertension, essential 01/27/2012   Immunosuppression ([place]) 01/27/2012   Obesity 01/27/2012    Alinda Deem, CCC-SLP 08/04/2021, 10:04 AM  [facility] [address] [place], [name], [address] Phone: [phone]   Fax:  [phone]   Name:  Yanelly Cantrelle MRN: [id number] Date of Birth: 11-17-1945

## 2021-08-06 ENCOUNTER — Ambulatory Visit: Payer: Medicare Other

## 2021-08-06 ENCOUNTER — Other Ambulatory Visit: Payer: Self-pay

## 2021-08-06 DIAGNOSIS — R41841 Cognitive communication deficit: Secondary | ICD-10-CM | POA: Diagnosis not present

## 2021-08-06 NOTE — Therapy (Signed)
[facility] [address] [place], [name], [address] Phone: [phone]   Fax:  [phone]  Speech Language Pathology Treatment/Progress Note  Patient Details  Name: Sarah Gilbert MRN: [id number] Date of Birth: 07/16/1946 Referring Provider (SLP): Frann Rider NP   Encounter Date: 08/06/2021   End of Session - 08/06/21 1057     Visit Number 10    Number of Visits 17    Date for SLP Re-Evaluation 09/01/21    Authorization Type UHC Medicare    SLP Start Time 1100    SLP Stop Time  1140    SLP Time Calculation (min) 40 min    Activity Tolerance Patient tolerated treatment well             Past Medical History:  Diagnosis Date   Anemia    Arthritis    Atrial fibrillation ([facility] [place])    Bleeding nose    history of    ESRD (end stage renal disease) ([place])    History of bronchitis    Hyperlipemia    Hypertension    Joint inflammation    Obesity    PONV (postoperative nausea and vomiting)    Rotator cuff tear arthropathy of right shoulder 06/18/2015   Thyroid disease    Wears glasses     Past Surgical History:  Procedure Laterality Date   ABDOMINAL HYSTERECTOMY     APPENDECTOMY     CARDIAC CATHETERIZATION N/A 04/18/2015   Procedure: Left Heart Cath and Coronary Angiography;  Surgeon: Charolette Forward, MD;  Location: [facility];  Service: Cardiovascular;  Laterality: N/A;   CHOLECYSTECTOMY     COLONOSCOPY     IR CT HEAD LTD  01/07/2020   IR PERCUTANEOUS ART THROMBECTOMY/INFUSION INTRACRANIAL INC DIAG ANGIO  01/07/2020   IR US GUIDE VASC ACCESS RIGHT  01/07/2020   [facility]     [facility]une [facility]   RADIOLOGY WITH ANESTHESIA N/A 01/07/2020   Procedure: IR WITH ANESTHESIA;  Surgeon: Radiologist, Medication, MD;  Location: [facility];  Service: Radiology;  Laterality: N/A;   TONSILLECTOMY     TOTAL SHOULDER ARTHROPLASTY Right 06/18/2015   Procedure: RIGHT TOTAL SHOULDER ARTHROPLASTY;  Surgeon: Marchia Bond, MD;  Location: [facility];  Service: Orthopedics;  Laterality: Right;    There were no vitals filed for this visit.   Subjective Assessment - 08/06/21 1100     Subjective "busy but good morning"    Patient is accompained by: Family member    Currently in Pain? No/denies            Speech Therapy Progress Note  Dates of Reporting Period: 07-02-21 to current  Objective Reports of Subjective Statement: Pt has been seen for 10 ST visits targeting cognitive linguistic skills.   Objective Measurements: Pt has exhibited some improvements for attention and recall with implementation of SLP recommendations and use external aids to aid short term memory. Pt continues to benefit from written cues and repetition to aid recall of pertinent information and task completion.   Goal Update: see goals below  Plan: continue per POC  Reason Skilled Services are Required: Pt continues to progress towards ST goals so continue skilled ST intervention to maximize functional independence and safety       ADULT SLP TREATMENT - 08/06/21 1057       General Information   Behavior/Cognition Alert;Cooperative;Pleasant mood;Requires cueing      Treatment Provided   Treatment provided Cognitive-Linquistic      Cognitive-Linquistic Treatment  Treatment focused on Cognition;Patient/family/caregiver education    Skilled Treatment Pt returned with daily journal partially completed. Pt reports she did not complete tasks because she didn't want to versus she forgot. Further education and instruction of daily journal provided to move tasks to next day and keeping page open to today's date to aid attention. Pt and uncle reported pt is asking less repeated questions. Pt's uncle noted to intermittently provide answers re: appointments; however, this was reportedly baseline prior to stroke. SLP suggested patient and uncle identify if they would like pt to become more independent with appointment management. SLP  assessed recall of daily to do items as written earlier in the session, in which pt did not recall independently. SLP prompted patient to check written to do list, which was effective to aid recall.      Assessment / Recommendations / Plan   Plan Continue with current plan of care      Progression Toward Goals   Progression toward goals Progressing toward goals              SLP Education - 08/06/21 1148     Education Details attention strategies, daily journal    Person(s) Educated Patient;Caregiver(s)    Methods Explanation;Demonstration;Handout;Verbal cues    Comprehension Verbalized understanding;Returned demonstration;Need further instruction              SLP Short Term Goals - 08/06/21 1058       SLP SHORT TERM GOAL #1   Title Pt will recall 3 memory strategies to increase recall of personally-relevant, important information at home by the end of 4 weeks given minA verbal cues.    Status Partially Met      SLP SHORT TERM GOAL #2   Title Pt will recall 3 attention strategies for optimization of focus during conversations to decrease cognitive fatigue and increase recall of important information given minA verbal cues.    Status Partially Met              SLP Long Term Goals - 08/06/21 1058       SLP LONG TERM GOAL #1   Title Pt will report successful use of 2+ memory strategies to increase recall of personally-relevant, important information at home.    Time 5    Period Weeks    Status On-going      SLP LONG TERM GOAL #2   Title Pt will report successful use of 2+ attention strategies for optimization of focus during conversations to increase recall of verbal/written information.    Time 5    Period Weeks    Status On-going              Plan - 08/06/21 1058     Clinical Impression Statement Ongoing training for compensations for memory and attention to A recall of pertinent information. We generated strategies to aid attention and recall of  recommendations/cognitive strategies. Successful implementation of bill pay system at home reported. Continue skilled ST to maximize independence and success with IADL's.    Speech Therapy Frequency 2x / week    Duration 8 weeks    Treatment/Interventions Compensatory strategies;Cueing hierarchy;Functional tasks;Patient/family education;Environmental controls;Cognitive reorganization;Compensatory techniques;Internal/external aids;SLP instruction and feedback    Potential to Achieve Goals Good             Patient will benefit from skilled therapeutic intervention in order to improve the following deficits and impairments:   Cognitive communication deficit    Problem List Patient Active Problem List  Diagnosis Date Noted   PAD (peripheral artery disease) ([facility]) 12/04/2020   Acute ischemic stroke ([facility]) - R MCA embolic from AF off AC s/p thrombectomy 01/07/2020   Abnormal glucose 12/29/2018   Mixed hyperlipidemia 09/29/2018   Lipoma of back 09/29/2018   Rotator cuff tear arthropathy of right shoulder 06/18/2015   S/p reverse total shoulder arthroplasty 06/18/2015   A-fib ([place]) 05/01/2015   Chronic anticoagulation 05/01/2015   Right foot pain 03/25/2015   Shingles (herpes zoster) polyneuropathy 02/06/2014   Constipation 01/27/2012   Hypercholesteremia 01/27/2012   Hypertension, essential 01/27/2012   Immunosuppression ([facility]) 01/27/2012   Obesity 01/27/2012    Alinda Deem, [place] 08/06/2021, 11:50 AM  [facility] [address] [place], [name], [address] Phone: [phone]   Fax:  [phone]   Name: Eevee Borbon MRN: [id number] Date of Birth: 1946/07/20

## 2021-08-13 ENCOUNTER — Ambulatory Visit: Payer: Medicare Other | Admitting: Speech Pathology

## 2021-08-15 ENCOUNTER — Ambulatory Visit: Payer: Medicare Other

## 2021-08-19 ENCOUNTER — Ambulatory Visit: Payer: Medicare Other | Attending: Nurse Practitioner

## 2021-08-19 ENCOUNTER — Other Ambulatory Visit: Payer: Self-pay

## 2021-08-19 DIAGNOSIS — R41841 Cognitive communication deficit: Secondary | ICD-10-CM | POA: Diagnosis not present

## 2021-08-19 NOTE — Patient Instructions (Addendum)
1) Get a notepad/tablet by next therapy session. You can take this pad with you throughout the house.   2) Pay the bill on the date you receive it. Write down that you have paid the bill.   3) Work on bills for ~1 hour each day. Leave them alone after that hour.   4) Begin reading through the Bible starting at the beginning. We will discuss what you read next session. You may need to re-read each section it 1-2 times to help you remember it and write yourself note.     Alternative things to do to engage your brain at home:  [place] saw puzzles Easy cross words Memory match [place] a new game!

## 2021-08-19 NOTE — Therapy (Signed)
[facility] [address] [place], [name], [address] Phone: [phone]   Fax:  [phone]  Speech Language Pathology Treatment  Patient Details  Name: Sarah Gilbert MRN: [id number] Date of Birth: 03/07/1946 Referring Provider (SLP): Frann Rider NP   Encounter Date: 08/19/2021   End of Session - 08/19/21 1131     Visit Number 11    Number of Visits 17    Date for SLP Re-Evaluation 09/01/21    Authorization Type UHC Medicare    SLP Start Time 1102    SLP Stop Time  1142    SLP Time Calculation (min) 40 min    Activity Tolerance Patient tolerated treatment well             Past Medical History:  Diagnosis Date   Anemia    Arthritis    Atrial fibrillation ([facility])    Bleeding nose    history of    ESRD (end stage renal disease) ([facility])    History of bronchitis    Hyperlipemia    Hypertension    Joint inflammation    Obesity    PONV (postoperative nausea and vomiting)    Rotator cuff tear arthropathy of right shoulder 06/18/2015   Thyroid disease    Wears glasses     Past Surgical History:  Procedure Laterality Date   ABDOMINAL HYSTERECTOMY     APPENDECTOMY     CARDIAC CATHETERIZATION N/A 04/18/2015   Procedure: Left Heart Cath and Coronary Angiography;  Surgeon: Charolette Forward, MD;  Location: [facility];  Service: Cardiovascular;  Laterality: N/A;   CHOLECYSTECTOMY     COLONOSCOPY     IR CT HEAD LTD  01/07/2020   IR PERCUTANEOUS ART THROMBECTOMY/INFUSION INTRACRANIAL INC DIAG ANGIO  01/07/2020   IR US GUIDE VASC ACCESS RIGHT  01/07/2020   [facility]ANT     [facility] 2011-[facility]   RADIOLOGY WITH ANESTHESIA N/A 01/07/2020   Procedure: IR WITH ANESTHESIA;  Surgeon: Radiologist, Medication, MD;  Location: [facility];  Service: Radiology;  Laterality: N/A;   TONSILLECTOMY     TOTAL SHOULDER ARTHROPLASTY Right 06/18/2015   Procedure: RIGHT TOTAL SHOULDER ARTHROPLASTY;  Surgeon: Marchia Bond, MD;   Location: [facility];  Service: Orthopedics;  Laterality: Right;    There were no vitals filed for this visit.   Subjective Assessment - 08/19/21 1104     Subjective "I didn't do anything"    Patient is accompained by: Family member    Currently in Pain? No/denies                   ADULT SLP TREATMENT - 08/19/21 1133       General Information   Behavior/Cognition Alert;Cooperative;Pleasant mood;Requires cueing      Treatment Provided   Treatment provided Cognitive-Linquistic      Cognitive-Linquistic Treatment   Treatment focused on Cognition;Patient/family/caregiver education    Skilled Treatment Pt endorsed no HEP completed due to busy holiday. SLP questions motivation as possible underlying component per discussion. Pt exhibits inconsistent awareness of deficits and continues to rely on her uncle, for example appointment time specifics. Her uncle also reported pt is hyperfixated on her bills and constantly sorts through them. Together we generated strategy to pay the bill on the date received to optimize efficiency and recall and only spend one hour a day on bills. Pt is planning on getting notepad to write down information, in which SLP recommended various usages, including to do lists and specific  information to recall. SLP provided handout, in which pt required repetition 3-4x to aid recall and recap to therapist.      Assessment / Recommendations / [place] with current plan of care      Progression Toward Goals   Progression toward goals Progressing toward goals   carryover of SLP recommendations             SLP Education - 08/19/21 1131     Education Details targeted tasks, complete HEP    Person(s) Educated Patient;Caregiver(s)    Methods Explanation;Demonstration;Verbal cues;Handout    Comprehension Verbalized understanding;Returned demonstration;Need further instruction              SLP Short Term Goals - 08/06/21 1058       SLP SHORT  TERM GOAL #1   Title Pt will recall 3 memory strategies to increase recall of personally-relevant, important information at home by the end of 4 weeks given minA verbal cues.    Status Partially Met      SLP SHORT TERM GOAL #2   Title Pt will recall 3 attention strategies for optimization of focus during conversations to decrease cognitive fatigue and increase recall of important information given minA verbal cues.    Status Partially Met              SLP Long Term Goals - 08/19/21 1305       SLP LONG TERM GOAL #1   Title Pt will report successful use of 2+ memory strategies to increase recall of personally-relevant, important information at home.    Time 4    Period Weeks    Status On-going      SLP LONG TERM GOAL #2   Title Pt will report successful use of 2+ attention strategies for optimization of focus during conversations to increase recall of verbal/written information.    Time 4    Period Weeks    Status On-going              Plan - 08/19/21 1132     Clinical Impression Statement Ongoing training for compensations for memory and attention to A recall of pertinent information. We generated strategies to aid attention and recall to optimize functional independence. SLP questions carryover and completion of tasks at home as pt continues to rely on uncle, which may be baseline behavior. Continue skilled ST to maximize independence and success with IADL's.    Speech Therapy Frequency 2x / week    Duration 8 weeks    Treatment/Interventions Compensatory strategies;Cueing hierarchy;Functional tasks;Patient/family education;Environmental controls;Cognitive reorganization;Compensatory techniques;Internal/external aids;SLP instruction and feedback    Potential to Achieve Goals Good             Patient will benefit from skilled therapeutic intervention in order to improve the following deficits and impairments:   Cognitive communication deficit    Problem  List Patient Active Problem List   Diagnosis Date Noted   PAD (peripheral artery disease) ([facility]) 12/04/2020   Acute ischemic stroke ([facility]) - R MCA embolic from AF off AC s/p thrombectomy 01/07/2020   Abnormal glucose 12/29/2018   Mixed hyperlipidemia 09/29/2018   Lipoma of back 09/29/2018   Rotator cuff tear arthropathy of right shoulder 06/18/2015   S/p reverse total shoulder arthroplasty 06/18/2015   A-fib ([facility]) 05/01/2015   Chronic anticoagulation 05/01/2015   Right foot pain 03/25/2015   Shingles (herpes zoster) polyneuropathy 02/06/2014   Constipation 01/27/2012   Hypercholesteremia 01/27/2012   Hypertension, essential 01/27/2012  Immunosuppression ([facility]) 01/27/2012   Obesity 01/27/2012    Alinda Deem, CCC-SLP 08/19/2021, 1:06 PM  [facility] [address] [place], [name], [address] Phone: [phone]   Fax:  [phone]   Name: Sarah Gilbert MRN: [id number] Date of Birth: 1946/04/18

## 2021-08-21 ENCOUNTER — Ambulatory Visit: Payer: Medicare Other

## 2021-08-21 ENCOUNTER — Other Ambulatory Visit: Payer: Self-pay

## 2021-08-21 DIAGNOSIS — R41841 Cognitive communication deficit: Secondary | ICD-10-CM | POA: Diagnosis not present

## 2021-08-21 NOTE — Therapy (Signed)
[facility] [address] [place], [name], [address] Phone: [phone]   Fax:  [phone]  Speech Language Pathology Treatment  Patient Details  Name: Sarah Gilbert MRN: [id number] Date of Birth: 1945/11/04 Referring Provider (SLP): Sarah Rider NP   Encounter Date: 08/21/2021   End of Session - 08/21/21 1257     Visit Number 12    Number of Visits 17    Date for SLP Re-Evaluation 09/01/21    Authorization Type UHC Medicare    SLP Start Time 1104    SLP Stop Time  1145    SLP Time Calculation (min) 41 min    Activity Tolerance Patient tolerated treatment well             Past Medical History:  Diagnosis Date   Anemia    Arthritis    Atrial fibrillation ([facility])    Bleeding nose    history of    ESRD (end stage renal disease) ([facility])    History of bronchitis    Hyperlipemia    Hypertension    Joint inflammation    Obesity    PONV (postoperative nausea and vomiting)    Rotator cuff tear arthropathy of right shoulder 06/18/2015   Thyroid disease    Wears glasses     Past Surgical History:  Procedure Laterality Date   ABDOMINAL HYSTERECTOMY     APPENDECTOMY     CARDIAC CATHETERIZATION N/A 04/18/2015   Procedure: Left Heart Cath and Coronary Angiography;  Surgeon: Charolette Forward, MD;  Location: [facility];  Service: Cardiovascular;  Laterality: N/A;   CHOLECYSTECTOMY     COLONOSCOPY     IR CT HEAD LTD  01/07/2020   IR PERCUTANEOUS ART THROMBECTOMY/INFUSION INTRACRANIAL INC DIAG ANGIO  01/07/2020   IR US GUIDE VASC ACCESS RIGHT  01/07/2020   [facility]ANT     [facility]   RADIOLOGY WITH ANESTHESIA N/A 01/07/2020   Procedure: IR WITH ANESTHESIA;  Surgeon: Radiologist, Medication, MD;  Location: [facility];  Service: Radiology;  Laterality: N/A;   TONSILLECTOMY     TOTAL SHOULDER ARTHROPLASTY Right 06/18/2015   Procedure: RIGHT TOTAL SHOULDER ARTHROPLASTY;  Surgeon: Marchia Bond, MD;   Location: [facility];  Service: Orthopedics;  Laterality: Right;    There were no vitals filed for this visit.   Subjective Assessment - 08/21/21 1104     Subjective "okay. I just got an upsetting call but it's okay"    Currently in Pain? No/denies                   ADULT SLP TREATMENT - 08/21/21 1105       General Information   Behavior/Cognition Alert;Cooperative;Pleasant mood;Requires cueing      Treatment Provided   Treatment provided Cognitive-Linquistic      Cognitive-Linquistic Treatment   Treatment focused on Cognition;Patient/family/caregiver education    Skilled Treatment Pt returned with Bible for reading HEP. Pt re-read passages per SLP recommendation. No note taking endorsed as pt did not buy "tablet" or notebook. SLP provided recommendations and resources for other written aids. SLP targeted use of sticky notes in Bible to aid recall of placement and short recap of previously learned information. SLP suggested setting definitive time and using timer for bill management. SLP continues to educate and model use of writing and repetition to aid recall, which has been successful.      Assessment / Recommendations / Plan   Plan Continue with current plan of care  Progression Toward Goals   Progression toward goals Progressing toward goals              SLP Education - 08/21/21 1257     Education Details sticky notes, repetition    Person(s) Educated Patient;Caregiver(s)    Methods Explanation;Demonstration;Handout    Comprehension Verbalized understanding;Returned demonstration;Need further instruction              SLP Short Term Goals - 08/06/21 1058       SLP SHORT TERM GOAL #1   Title Pt will recall 3 memory strategies to increase recall of personally-relevant, important information at home by the end of 4 weeks given minA verbal cues.    Status Partially Met      SLP SHORT TERM GOAL #2   Title Pt will recall 3 attention strategies for  optimization of focus during conversations to decrease cognitive fatigue and increase recall of important information given minA verbal cues.    Status Partially Met              SLP Long Term Goals - 08/21/21 1123       SLP LONG TERM GOAL #1   Title Pt will report successful use of 2+ memory strategies to increase recall of personally-relevant, important information at home.    Time 4    Period Weeks    Status On-going      SLP LONG TERM GOAL #2   Title Pt will report successful use of 2+ attention strategies for optimization of focus during conversations to increase recall of verbal/written information.    Time 4    Period Weeks    Status On-going              Plan - 08/21/21 1258     Clinical Impression Statement Ongoing education and training for compensations for memory and attention to A recall of pertinent information. We generated strategies to aid attention and recall to optimize functional independence with bill management, retention of Bible readings, and recall of important information. Continue skilled ST to maximize independence and success with IADL's.    Speech Therapy Frequency 2x / week    Duration 8 weeks    Treatment/Interventions Compensatory strategies;Cueing hierarchy;Functional tasks;Patient/family education;Environmental controls;Cognitive reorganization;Compensatory techniques;Internal/external aids;SLP instruction and feedback    Potential to Achieve Goals Good             Patient will benefit from skilled therapeutic intervention in order to improve the following deficits and impairments:   Cognitive communication deficit    Problem List Patient Active Problem List   Diagnosis Date Noted   PAD (peripheral artery disease) ([facility]) 12/04/2020   Acute ischemic stroke ([facility]) - R MCA embolic from AF off AC s/p thrombectomy 01/07/2020   Abnormal glucose 12/29/2018   Mixed hyperlipidemia 09/29/2018   Lipoma of back 09/29/2018   Rotator cuff  tear arthropathy of right shoulder 06/18/2015   S/p reverse total shoulder arthroplasty 06/18/2015   A-fib ([facility]) 05/01/2015   Chronic anticoagulation 05/01/2015   Right foot pain 03/25/2015   Shingles (herpes zoster) polyneuropathy 02/06/2014   Constipation 01/27/2012   Hypercholesteremia 01/27/2012   Hypertension, essential 01/27/2012   Immunosuppression ([facility]) 01/27/2012   Obesity 01/27/2012    Alinda Deem, [place] 08/21/2021, 12:59 PM  [facility] [address] [place], [name], [address] Phone: [phone]   Fax:  [phone]   Name: Sarah Gilbert MRN: [id number] Date of Birth: Jan 09, 1946

## 2021-08-21 NOTE — Patient Instructions (Signed)
1) Transfer your sticky note with needed items onto your grocery list TODAY  2) Write notes as you read along in your Bible. You can use sticky notes or your tablet for this. The sticky note can serve as a place marker of where you left off

## 2021-08-25 ENCOUNTER — Other Ambulatory Visit: Payer: Self-pay

## 2021-08-25 ENCOUNTER — Ambulatory Visit: Payer: Medicare Other

## 2021-08-25 DIAGNOSIS — R41841 Cognitive communication deficit: Secondary | ICD-10-CM | POA: Diagnosis not present

## 2021-08-25 NOTE — Therapy (Signed)
[facility] [address] [place], [name], [address] Phone: [phone]   Fax:  [phone]  Speech Language Pathology Treatment  Patient Details  Name: Sarah Gilbert MRN: [id number] Date of Birth: 02-Oct-1945 Referring Provider (SLP): Frann Rider NP   Encounter Date: 08/25/2021   End of Session - 08/25/21 1013     Visit Number 13    Number of Visits 17    Date for SLP Re-Evaluation 09/01/21    Authorization Type UHC Medicare    SLP Start Time 1015    SLP Stop Time  1055    SLP Time Calculation (min) 40 min    Activity Tolerance Patient tolerated treatment well             Past Medical History:  Diagnosis Date   Anemia    Arthritis    Atrial fibrillation ([facility])    Bleeding nose    history of    ESRD (end stage renal disease) ([facility])    History of bronchitis    Hyperlipemia    Hypertension    Joint inflammation    Obesity    PONV (postoperative nausea and vomiting)    Rotator cuff tear arthropathy of right shoulder 06/18/2015   Thyroid disease    Wears glasses     Past Surgical History:  Procedure Laterality Date   ABDOMINAL HYSTERECTOMY     APPENDECTOMY     CARDIAC CATHETERIZATION N/A 04/18/2015   Procedure: Left Heart Cath and Coronary Angiography;  Surgeon: Charolette Forward, MD;  Location: [facility];  Service: Cardiovascular;  Laterality: N/A;   CHOLECYSTECTOMY     COLONOSCOPY     IR CT HEAD LTD  01/07/2020   IR PERCUTANEOUS ART THROMBECTOMY/INFUSION INTRACRANIAL INC DIAG ANGIO  01/07/2020   IR US GUIDE VASC ACCESS RIGHT  01/07/2020   [facility]PLANT     june 2011-[facility]   RADIOLOGY WITH ANESTHESIA N/A 01/07/2020   Procedure: IR WITH ANESTHESIA;  Surgeon: Radiologist, Medication, MD;  Location: [facility];  Service: Radiology;  Laterality: N/A;   TONSILLECTOMY     TOTAL SHOULDER ARTHROPLASTY Right 06/18/2015   Procedure: RIGHT TOTAL SHOULDER ARTHROPLASTY;  Surgeon: Marchia Bond, MD;   Location: [facility];  Service: Orthopedics;  Laterality: Right;    There were no vitals filed for this visit.   Subjective Assessment - 08/25/21 1013     Subjective "kind of busy"    Patient is accompained by: Family member    Currently in Pain? No/denies                   ADULT SLP TREATMENT - 08/25/21 1013       General Information   Behavior/Cognition Alert;Cooperative;Pleasant mood;Requires cueing      Treatment Provided   Treatment provided Cognitive-Linquistic      Cognitive-Linquistic Treatment   Treatment focused on Cognition;Patient/family/caregiver education    Skilled Treatment Pt returned with "tablet" or notebook to aid recall. Pt did not recall what to write in tablet, despite previous education. SLP re-educated usages of written external aids and recommended important things to include (to do lists, important information, appointment times, notes). Usual cues required to be specific when writing information to aid recall. SLP continues to emphasize use of writing and repetition to aid recall for day to day activities, including Sunday School.      Assessment / Recommendations / Plan   Plan Continue with current plan of care      Progression Toward  Goals   Progression toward goals Progressing toward goals              SLP Education - 08/25/21 1059     Education Details writing down, repetition    Person(s) Educated Patient;Caregiver(s)    Methods Explanation;Demonstration;Verbal cues;Handout    Comprehension Verbalized understanding;Returned demonstration;Verbal cues required;Need further instruction              SLP Short Term Goals - 08/06/21 1058       SLP SHORT TERM GOAL #1   Title Pt will recall 3 memory strategies to increase recall of personally-relevant, important information at home by the end of 4 weeks given minA verbal cues.    Status Partially Met      SLP SHORT TERM GOAL #2   Title Pt will recall 3 attention strategies for  optimization of focus during conversations to decrease cognitive fatigue and increase recall of important information given minA verbal cues.    Status Partially Met              SLP Long Term Goals - 08/25/21 1013       SLP LONG TERM GOAL #1   Title Pt will report successful use of 2+ memory strategies to increase recall of personally-relevant, important information at home.    Time 3    Period Weeks    Status On-going      SLP LONG TERM GOAL #2   Title Pt will report successful use of 2+ attention strategies for optimization of focus during conversations to increase recall of verbal/written information.    Time 3    Period Weeks    Status On-going              Plan - 08/25/21 1013     Clinical Impression Statement Ongoing education and training for compensations for memory and attention to A recall of pertinent information. We generated strategies to aid attention and recall to optimize functional independence with bill management, retention of church studies, and recall of important information. Pt continues to require intermittent to frequent cues to optimize recall of functional information. Continue skilled ST to maximize independence and success with IADL's.    Speech Therapy Frequency 2x / week    Duration 8 weeks    Treatment/Interventions Compensatory strategies;Cueing hierarchy;Functional tasks;Patient/family education;Environmental controls;Cognitive reorganization;Compensatory techniques;Internal/external aids;SLP instruction and feedback    Potential to Achieve Goals Good             Patient will benefit from skilled therapeutic intervention in order to improve the following deficits and impairments:   Cognitive communication deficit    Problem List Patient Active Problem List   Diagnosis Date Noted   PAD (peripheral artery disease) ([facility]) 12/04/2020   Acute ischemic stroke ([facility]) - R MCA embolic from AF off AC s/p thrombectomy 01/07/2020   Abnormal  glucose 12/29/2018   Mixed hyperlipidemia 09/29/2018   Lipoma of back 09/29/2018   Rotator cuff tear arthropathy of right shoulder 06/18/2015   S/p reverse total shoulder arthroplasty 06/18/2015   A-fib ([facility]) 05/01/2015   Chronic anticoagulation 05/01/2015   Right foot pain 03/25/2015   Shingles (herpes zoster) polyneuropathy 02/06/2014   Constipation 01/27/2012   Hypercholesteremia 01/27/2012   Hypertension, essential 01/27/2012   Immunosuppression ([facility]) 01/27/2012   Obesity 01/27/2012    Alinda Deem, [place] 08/25/2021, 11:02 AM  [facility] [address] [place], [name], [address] Phone: [phone]   Fax:  [phone]   Name:  Sarah Gilbert MRN: [id number] Date of Birth: 11-17-1945

## 2021-08-25 NOTE — Patient Instructions (Signed)
1) Start using your tablet for every day. Be specific when you write down information to help your memory. Examples of things you can write down:  -Important information you need to remember (from conversation or phone calls) -Appointments times  -To-do lists   -Things to purchase   -Sunday School/Bible Study notes   2) Only work on your bills for ~1 hour a day. If you start to go through your bills again, stop and use that energy for something else (ex: organizing papers bedside you chair)

## 2021-08-27 ENCOUNTER — Ambulatory Visit: Payer: Medicare Other

## 2021-08-27 ENCOUNTER — Other Ambulatory Visit: Payer: Self-pay

## 2021-08-27 DIAGNOSIS — R41841 Cognitive communication deficit: Secondary | ICD-10-CM | POA: Diagnosis not present

## 2021-08-27 NOTE — Therapy (Signed)
[facility] [address] [place], [name], [address] Phone: [phone]   Fax:  [phone]  Speech Language Pathology Treatment  Patient Details  Name: Sarah Gilbert MRN: [id number] Date of Birth: 1946/02/19 Referring Provider (SLP): Frann Rider NP   Encounter Date: 08/27/2021   End of Session - 08/27/21 1004     Visit Number 14    Number of Visits 17    Date for SLP Re-Evaluation 09/01/21    Authorization Type UHC Medicare    SLP Start Time 1010    SLP Stop Time  1050    SLP Time Calculation (min) 40 min    Activity Tolerance Patient tolerated treatment well             Past Medical History:  Diagnosis Date   Anemia    Arthritis    Atrial fibrillation ([facility])    Bleeding nose    history of    ESRD (end stage renal disease) ([facility])    History of bronchitis    Hyperlipemia    Hypertension    Joint inflammation    Obesity    PONV (postoperative nausea and vomiting)    Rotator cuff tear arthropathy of right shoulder 06/18/2015   Thyroid disease    Wears glasses     Past Surgical History:  Procedure Laterality Date   ABDOMINAL HYSTERECTOMY     APPENDECTOMY     CARDIAC CATHETERIZATION N/A 04/18/2015   Procedure: Left Heart Cath and Coronary Angiography;  Surgeon: Charolette Forward, MD;  Location: [facility];  Service: Cardiovascular;  Laterality: N/A;   CHOLECYSTECTOMY     COLONOSCOPY     IR CT HEAD LTD  01/07/2020   IR PERCUTANEOUS ART THROMBECTOMY/INFUSION INTRACRANIAL INC DIAG ANGIO  01/07/2020   IR US GUIDE VASC ACCESS RIGHT  01/07/2020   [facility]PLANT     june 2011-[facility]   RADIOLOGY WITH ANESTHESIA N/A 01/07/2020   Procedure: IR WITH ANESTHESIA;  Surgeon: Radiologist, Medication, MD;  Location: [facility];  Service: Radiology;  Laterality: N/A;   TONSILLECTOMY     TOTAL SHOULDER ARTHROPLASTY Right 06/18/2015   Procedure: RIGHT TOTAL SHOULDER ARTHROPLASTY;  Surgeon: Marchia Bond, MD;   Location: [facility];  Service: Orthopedics;  Laterality: Right;    There were no vitals filed for this visit.   Subjective Assessment - 08/27/21 1009     Subjective "how was your weekend?"    Patient is accompained by: Family member    Currently in Pain? No/denies                   ADULT SLP TREATMENT - 08/27/21 1004       General Information   Behavior/Cognition Alert;Cooperative;Pleasant mood;Requires cueing;Agitated      Treatment Provided   Treatment provided Cognitive-Linquistic      Cognitive-Linquistic Treatment   Treatment focused on Cognition;Patient/family/caregiver education    Skilled Treatment Pt reported she talked to her accountant who stated she can dispose of old bills that are older than 5 years. SLP conducted therapeutic task for patient to calculate targeted year that would be sufficient to dispose of bills. Pt unable to complete task independently with agitation exhibited. Pt attempted to explain away deficits. SLP provided max A to simply and scaffold task by writing out years in reverse. Pt only able to recall 1 previous memory/attention compensation and recommendation. SLP generated strategy for pt rewrite and rephrase targeted compensations in own words on one sheet of paper for  her to review at home.      Assessment / Recommendations / Plan   Plan Continue with current plan of care      Progression Toward Goals   Progression toward goals Not progressing toward goals (comment)   limited carryover of SLP education and recommendations             SLP Education - 08/27/21 1036     Education Details memory compensations, attention compensations    Person(s) Educated Patient;Caregiver(s)    Methods Explanation;Demonstration;Handout    Comprehension Verbalized understanding;Need further instruction              SLP Short Term Goals - 08/06/21 1058       SLP SHORT TERM GOAL #1   Title Pt will recall 3 memory strategies to increase recall  of personally-relevant, important information at home by the end of 4 weeks given minA verbal cues.    Status Partially Met      SLP SHORT TERM GOAL #2   Title Pt will recall 3 attention strategies for optimization of focus during conversations to decrease cognitive fatigue and increase recall of important information given minA verbal cues.    Status Partially Met              SLP Long Term Goals - 08/27/21 1005       SLP LONG TERM GOAL #1   Title Pt will report successful use of 2+ memory strategies to increase recall of personally-relevant, important information at home.    Time 3    Period Weeks    Status On-going      SLP LONG TERM GOAL #2   Title Pt will report successful use of 2+ attention strategies for optimization of focus during conversations to increase recall of verbal/written information.    Time 3    Period Weeks    Status On-going              Plan - 08/27/21 1004     Clinical Impression Statement Ongoing education and training for compensations for memory and attention to A recall of pertinent information. We generated strategies to rewrite previous memory compensations/recommendations to aid attention and recall of learned techniques to optimize functional independence with bill management, retention of church studies, and recall of important information. Pt continues to require intermittent to frequent cues to optimize recall of functional information. Continue skilled ST to maximize independence and success with IADL's.    Speech Therapy Frequency 2x / week    Duration 8 weeks    Treatment/Interventions Compensatory strategies;Cueing hierarchy;Functional tasks;Patient/family education;Environmental controls;Cognitive reorganization;Compensatory techniques;Internal/external aids;SLP instruction and feedback    Potential to Achieve Goals Good             Patient will benefit from skilled therapeutic intervention in order to improve the following  deficits and impairments:   Cognitive communication deficit    Problem List Patient Active Problem List   Diagnosis Date Noted   PAD (peripheral artery disease) ([facility]) 12/04/2020   Acute ischemic stroke ([facility]) - R MCA embolic from AF off AC s/p thrombectomy 01/07/2020   Abnormal glucose 12/29/2018   Mixed hyperlipidemia 09/29/2018   Lipoma of back 09/29/2018   Rotator cuff tear arthropathy of right shoulder 06/18/2015   S/p reverse total shoulder arthroplasty 06/18/2015   A-fib ([facility]) 05/01/2015   Chronic anticoagulation 05/01/2015   Right foot pain 03/25/2015   Shingles (herpes zoster) polyneuropathy 02/06/2014   Constipation 01/27/2012   Hypercholesteremia 01/27/2012   Hypertension,  essential 01/27/2012   Immunosuppression ([facility]) 01/27/2012   Obesity 01/27/2012    Alinda Deem, CCC-SLP 08/27/2021, 11:05 AM  [facility] [address] [place], [name], [address] Phone: [phone]   Fax:  [phone]   Name: Irish Breisch MRN: [id number] Date of Birth: 02-09-1946

## 2021-09-01 ENCOUNTER — Ambulatory Visit: Payer: Medicare Other

## 2021-09-01 ENCOUNTER — Other Ambulatory Visit: Payer: Self-pay

## 2021-09-01 DIAGNOSIS — R41841 Cognitive communication deficit: Secondary | ICD-10-CM

## 2021-09-01 NOTE — Therapy (Signed)
[facility] [address] [place], [name], [address] Phone: [phone]   Fax:  [phone]  Speech Language Pathology Treatment/Discharge Summary  Patient Details  Name: Sarah Gilbert MRN: [id number] Date of Birth: 07/28/1946 Referring Provider (SLP): Frann Rider NP   Encounter Date: 09/01/2021   End of Session - 09/01/21 1015     Visit Number 15    Number of Visits 17    Date for SLP Re-Evaluation 09/01/21    Authorization Type UHC Medicare    SLP Start Time 1015    SLP Stop Time  1046    SLP Time Calculation (min) 31 min    Activity Tolerance Patient tolerated treatment well             Past Medical History:  Diagnosis Date   Anemia    Arthritis    Atrial fibrillation ([facility])    Bleeding nose    history of    ESRD (end stage renal disease) ([facility])    History of bronchitis    Hyperlipemia    Hypertension    Joint inflammation    Obesity    PONV (postoperative nausea and vomiting)    Rotator cuff tear arthropathy of right shoulder 06/18/2015   Thyroid disease    Wears glasses     Past Surgical History:  Procedure Laterality Date   ABDOMINAL HYSTERECTOMY     APPENDECTOMY     CARDIAC CATHETERIZATION N/A 04/18/2015   Procedure: Left Heart Cath and Coronary Angiography;  Surgeon: Charolette Forward, MD;  Location: [facility];  Service: Cardiovascular;  Laterality: N/A;   CHOLECYSTECTOMY     COLONOSCOPY     IR CT HEAD LTD  01/07/2020   IR PERCUTANEOUS ART THROMBECTOMY/INFUSION INTRACRANIAL INC DIAG ANGIO  01/07/2020   IR US GUIDE VASC ACCESS RIGHT  01/07/2020   [facility]PLANT     [facility]une 2011-[facility]   RADIOLOGY WITH ANESTHESIA N/A 01/07/2020   Procedure: IR WITH ANESTHESIA;  Surgeon: Radiologist, Medication, MD;  Location: [facility];  Service: Radiology;  Laterality: N/A;   TONSILLECTOMY     TOTAL SHOULDER ARTHROPLASTY Right 06/18/2015   Procedure: RIGHT TOTAL SHOULDER ARTHROPLASTY;  Surgeon:  Marchia Bond, MD;  Location: [facility];  Service: Orthopedics;  Laterality: Right;    There were no vitals filed for this visit.   Subjective Assessment - 09/01/21 1018     Subjective "I'm kind of excited about it"    Patient is accompained by: Family member    Currently in Pain? No/denies              SPEECH THERAPY DISCHARGE SUMMARY  Visits from Start of Care: 15  Current functional level related to goals / functional outcomes: Jakelin presents with some mild improvements in cognitive linguistic functioning with use of learned compensations and techniques provided in ST intervention targeting memory and attention. Pt would continue to benefit from intermittent caregiver assistance to ensure learned compensations are being implemented. Pt is pleased with current progress and agreeable to ST discharge on last scheduled visit.    Remaining deficits: Mild to moderate memory and attention deficits   Education / Equipment: Memory/attention compensations, financial management strategies, caregiver education   Patient agrees to discharge. Patient goals were partially met. Patient is being discharged due to being pleased with the current functional level..     ADULT SLP TREATMENT - 09/01/21 1015       General Information   Behavior/Cognition Alert;Cooperative;Pleasant mood;Requires cueing  Treatment Provided   Treatment provided Cognitive-Linquistic      Cognitive-Linquistic Treatment   Treatment focused on Cognition;Patient/family/caregiver education    Skilled Treatment Pt stated "I feel like I've made some progress" as pt was excited for ST discharge this date. Pt re-read SLP recommendations and reported some improved carryover and implementation. Less frequent rumaging through bill stack indicated by uncle as pt able to recall need to complete only 1x/day. SLP educated her uncle on providing verbal cues to aid recall of completed tasks if behaviors begin again. Pt continued  to report good usage of calendar and managmeent of appointments. Less reliance on uncle reported since initiation of ST intervention. SLP re-educated memory compensations, including usage of "tablet" or notebook to include important information to recall.      Assessment / Recommendations / Plan   Plan Discharge SLP treatment due to (comment)   POC complete; maximum potential at this time     Progression Toward Goals   Progression toward goals Goals partially met, education completed, patient discharged from [facility] Education - 09/01/21 1029     Education Details discharge summary, memory/attention compensations    Person(s) Educated Patient;Caregiver(s)    Methods Explanation;Demonstration    Comprehension Verbalized understanding;Returned demonstration              SLP Short Term Goals - 08/06/21 1058       SLP SHORT TERM GOAL #1   Title Pt will recall 3 memory strategies to increase recall of personally-relevant, important information at home by the end of 4 weeks given minA verbal cues.    Status Partially Met      SLP SHORT TERM GOAL #2   Title Pt will recall 3 attention strategies for optimization of focus during conversations to decrease cognitive fatigue and increase recall of important information given minA verbal cues.    Status Partially Met              SLP Long Term Goals - 09/01/21 1016       SLP LONG TERM GOAL #1   Title Pt will report successful use of 2+ memory strategies to increase recall of personally-relevant, important information at home.    Status Partially Met      SLP LONG TERM GOAL #2   Title Pt will report successful use of 2+ attention strategies for optimization of focus during conversations to increase recall of verbal/written information.    Status Partially Met              Plan - 09/01/21 1016     Clinical Impression Statement Completed education and training of compensations for memory and attention to A  recall of pertinent information. Improved implementation of recommendations with use of rephrased compensations/recommendations to aid attention and recall of learned techniques to optimize functional independence with bill management, retention of church studies, and recall of important information. Pt continues to require intermittent to frequent cues to optimize recall of functional information, in which SLP educated caregiver on providing increased cues at home as needed to optimize implementation of compensations/recommendations. Pt exhibits maximum potential at this time and is agreeable to ST discharge today.    Speech Therapy Frequency 2x / week    Duration 8 weeks    Treatment/Interventions Compensatory strategies;Cueing hierarchy;Functional tasks;Patient/family education;Environmental controls;Cognitive reorganization;Compensatory techniques;Internal/external aids;SLP instruction and feedback    Potential to Achieve Goals Good  Patient will benefit from skilled therapeutic intervention in order to improve the following deficits and impairments:   Cognitive communication deficit    Problem List Patient Active Problem List   Diagnosis Date Noted   PAD (peripheral artery disease) ([facility]) 12/04/2020   Acute ischemic stroke ([facility]) - R MCA embolic from AF off AC s/p thrombectomy 01/07/2020   Abnormal glucose 12/29/2018   Mixed hyperlipidemia 09/29/2018   Lipoma of back 09/29/2018   Rotator cuff tear arthropathy of right shoulder 06/18/2015   S/p reverse total shoulder arthroplasty 06/18/2015   A-fib ([facility]) 05/01/2015   Chronic anticoagulation 05/01/2015   Right foot pain 03/25/2015   Shingles (herpes zoster) polyneuropathy 02/06/2014   Constipation 01/27/2012   Hypercholesteremia 01/27/2012   Hypertension, essential 01/27/2012   Immunosuppression ([place] [facility]) 01/27/2012   Obesity 01/27/2012    Alinda Deem, [place] 09/01/2021, 10:48 AM  [facility] [address] [place], [name], [address] Phone: [phone]   Fax:  [phone]   Name: Tashema Tiller MRN: [id number] Date of Birth: 1945/10/22

## 2021-10-13 ENCOUNTER — Ambulatory Visit (INDEPENDENT_AMBULATORY_CARE_PROVIDER_SITE_OTHER): Payer: Medicare Other | Admitting: Nurse Practitioner

## 2021-10-13 ENCOUNTER — Other Ambulatory Visit: Payer: Self-pay

## 2021-10-13 ENCOUNTER — Encounter: Payer: Self-pay | Admitting: Nurse Practitioner

## 2021-10-13 VITALS — BP 130/72 | HR 68 | Temp 98.3°F | Ht 65.0 in | Wt 184.0 lb

## 2021-10-13 DIAGNOSIS — R7309 Other abnormal glucose: Secondary | ICD-10-CM

## 2021-10-13 DIAGNOSIS — E559 Vitamin D deficiency, unspecified: Secondary | ICD-10-CM

## 2021-10-13 DIAGNOSIS — I4811 Longstanding persistent atrial fibrillation: Secondary | ICD-10-CM

## 2021-10-13 DIAGNOSIS — E6609 Other obesity due to excess calories: Secondary | ICD-10-CM

## 2021-10-13 DIAGNOSIS — B3789 Other sites of candidiasis: Secondary | ICD-10-CM

## 2021-10-13 DIAGNOSIS — D849 Immunodeficiency, unspecified: Secondary | ICD-10-CM

## 2021-10-13 DIAGNOSIS — I739 Peripheral vascular disease, unspecified: Secondary | ICD-10-CM | POA: Diagnosis not present

## 2021-10-13 DIAGNOSIS — E782 Mixed hyperlipidemia: Secondary | ICD-10-CM

## 2021-10-13 DIAGNOSIS — I119 Hypertensive heart disease without heart failure: Secondary | ICD-10-CM

## 2021-10-13 DIAGNOSIS — Z6830 Body mass index (BMI) 30.0-30.9, adult: Secondary | ICD-10-CM

## 2021-10-13 DIAGNOSIS — Z1211 Encounter for screening for malignant neoplasm of colon: Secondary | ICD-10-CM

## 2021-10-13 MED ORDER — NYSTATIN 100000 UNIT/GM EX CREA: 1.0000 "application " | TOPICAL_CREAM | Freq: Two times a day (BID) | CUTANEOUS | 2 refills | Status: AC

## 2021-10-13 NOTE — Progress Notes (Addendum)
I,Tianna Badgett,acting as a Education administrator for [employer], FNP.,have documented all relevant documentation on the behalf of [name], FNP,as directed by  [name], FNP while in the presence of [name], [place].  This visit occurred during the SARS-CoV-2 public health emergency.  Safety protocols were in place, including screening questions prior to the visit, additional usage of staff PPE, and extensive cleaning of exam room while observing appropriate contact time as indicated for disinfecting solutions.  Subjective:     Patient ID: Sarah Gilbert , female    DOB: 28-Jan-1946 , 76 y.o.   MRN: [id number]   Chief Complaint  Patient presents with   Diabetes    HPI  The patient is here today for a diabetes f/u. She is taking a vitamin d supplement two times a week. Wants her buttocks area to be rechecked since having a boil/open area, she feels like it is healing   Diabetes She presents for her follow-up diabetic visit. She has type 2 diabetes mellitus. There are no hypoglycemic associated symptoms. Pertinent negatives for hypoglycemia include no dizziness or headaches. There are no diabetic associated symptoms. Pertinent negatives for diabetes include no chest pain, no fatigue, no polydipsia, no polyphagia and no polyuria. There are no hypoglycemic complications. There are no diabetic complications. Risk factors for coronary artery disease include obesity and sedentary lifestyle. When asked about current treatments, none were reported. She is following a generally healthy diet. When asked about meal planning, she reported none. She has not had a previous visit with a dietitian. She rarely participates in exercise. (Does not check her blood sugars) She does not see a podiatrist.Eye exam is not current.    Past Medical History:  Diagnosis Date   Anemia    Arthritis    Atrial fibrillation (HCC)    Bleeding nose    history of    ESRD (end stage renal disease) (HCC)    History of  bronchitis    Hyperlipemia    Hypertension    Joint inflammation    Obesity    PONV (postoperative nausea and vomiting)    Rotator cuff tear arthropathy of right shoulder 06/18/2015   Thyroid disease    Wears glasses      Family History  Problem Relation Age of Onset   Dementia Mother    Cancer Father      Current Outpatient Medications:    amLODipine (NORVASC) 10 MG tablet, Take 5 mg by mouth daily. , Disp: , Rfl:    apixaban (ELIQUIS) 5 MG TABS tablet, Take 1 tablet (5 mg total) by mouth 2 (two) times daily., Disp: 60 tablet, Rfl: 2   atorvastatin (LIPITOR) 40 MG tablet, Take 1 tablet (40 mg total) by mouth daily., Disp: 30 tablet, Rfl: 2   b complex-vitamin c-folic acid (NEPHRO-VITE) 0.8 MG TABS, Take 60-300 mg by mouth at bedtime., Disp: , Rfl:    bacitracin ointment, Apply to affected area daily, Disp: 28 g, Rfl: 2   furosemide (LASIX) 40 MG tablet, Take 40 mg by mouth daily., Disp: , Rfl:    magnesium gluconate (MAGONATE) 500 MG tablet, Take 500 mg by mouth 2 (two) times daily., Disp: , Rfl:    metoprolol (LOPRESSOR) 50 MG tablet, Take 50 mg by mouth 2 (two) times daily., Disp: , Rfl:    mycophenolate (CELLCEPT) 250 MG capsule, Take 500 mg by mouth 2 (two) times daily., Disp: , Rfl:    nystatin cream (MYCOSTATIN), Apply 1 application topically 2 (two) times daily.,  Disp: 30 g, Rfl: 2   pantoprazole (PROTONIX) 40 MG tablet, Take 40 mg by mouth daily. , Disp: , Rfl:    potassium chloride SA (K-DUR,KLOR-CON) 20 MEQ tablet, Take 20 mEq by mouth 2 (two) times daily., Disp: , Rfl:    tacrolimus (PROGRAF) 0.5 MG capsule, Take 0.5 mg by mouth 2 (two) times daily. Take one capsule by mouth every evening., Disp: , Rfl:    tacrolimus (PROGRAF) 1 MG capsule, Take 2 mg by mouth 2 (two) times daily. Take 2 capsules by mouth every morning and 1 capsule in the evenings., Disp: , Rfl:    Vitamin D, Ergocalciferol, (DRISDOL) 1.25 MG (50000 UNIT) CAPS capsule, TAKE 1 CAPSULE BY MOUTH 2 TIMES A  WEEK, Disp: 12 capsule, Rfl: 2   Allergies  Allergen Reactions   Vancomycin Hives, Itching, Rash and Other (See Comments)    Blistering, swelling, and peeling of feet.  Pt denies any similar reactions around face or neck.   Iohexol      Desc: iodine contrast/per dialysis center, Onset Date: [id number]      Review of Systems  Constitutional: Negative.  Negative for fatigue.  Respiratory: Negative.    Cardiovascular: Negative.  Negative for chest pain.  Gastrointestinal: Negative.   Endocrine: Negative for polydipsia, polyphagia and polyuria.  Skin:  Positive for wound.  Neurological: Negative.  Negative for dizziness and headaches.  Psychiatric/Behavioral: Negative.      Today's Vitals   10/13/21 1032  BP: 130/72  Pulse: 68  Temp: 98.3 F (36.8 C)  TempSrc: Oral  Weight: 184 lb (83.5 kg)  Height: 5\' 5"  (1.651 m)   Body mass index is 30.62 kg/m.   Objective:  Physical Exam Vitals reviewed.  Constitutional:      General: She is not in acute distress.    Appearance: Normal appearance. She is obese.  Cardiovascular:     Rate and Rhythm: Normal rate and regular rhythm.     Pulses: Normal pulses.     Heart sounds: Normal heart sounds. No murmur heard. Pulmonary:     Effort: Pulmonary effort is normal. No respiratory distress.     Breath sounds: Normal breath sounds. No wheezing.  Musculoskeletal:     Right lower leg: No edema.     Left lower leg: No edema.  Skin:    General: Skin is warm and dry.     Findings: Rash (slight hypopigmentation with excoriation to buttocks area upper area wat crease and right buttocks with healing wound looks like healing excoriation to skin) present.  Neurological:     General: No focal deficit present.     Mental Status: She is alert and oriented to person, place, and time.     Cranial Nerves: No cranial nerve deficit.     Motor: No weakness.  Psychiatric:        Mood and Affect: Mood normal.        Behavior: Behavior normal.         Thought Content: Thought content normal.        Judgment: Judgment normal.        Assessment And Plan:     1. Abnormal glucose Comments: HgbA1c is improving, continue to avoid sugary foods and drinks.   2. Mixed hyperlipidemia Comments: Cholesterol levels are normal, continue avoiding high fat foods  3. Vitamin D deficiency Will check vitamin D level and supplement as needed.    Also encouraged to spend 15 minutes in the sun daily.   4.  Class 1 obesity due to excess calories with serious comorbidity and body mass index (BMI) of 30.0 to 30.9 in adult Chronic Discussed healthy diet and regular exercise options  Encouraged to exercise at least 150 minutes per week with 2 days of strength training  5. Encounter for screening colonoscopy According to USPTF Colorectal cancer Screening guidelines. Colonoscopy is recommended every 10 years, starting at age 73 years.she does not feel comfortable with doing a cologuard Will refer to GI for colon cancer screening. - Ambulatory referral to Gastroenterology  6. Candidiasis of anus Hypopigmented excoriated skin to upper buttocks and right cheek - nystatin cream (MYCOSTATIN); Apply 1 application topically 2 (two) times daily.  Dispense: 30 g; Refill: 2  7. Immunosuppression ([place]) Comments: Continues with [facility] for her renal transplant  8. PAD (peripheral artery disease) ([place]) Comments: Doing well overall, no swelling or symptoms at this time. Taking Eliquis for history of stroke and atrial fibrillation  9. Longstanding persistent atrial fibrillation (HCC) Continue Eliquis and follow up with Cardiology  10. Benign hypertensive heart disease without heart failure Blood pressure is controlled, continue current medications  Patient was given opportunity to ask questions. Patient verbalized understanding of the plan and was able to repeat key elements of the plan. All questions were answered to their satisfaction.  [name], FNP   I,  [name], FNP, have reviewed all documentation for this visit. The documentation on 10/13/21 for the exam, diagnosis, procedures, and orders are all accurate and complete.   IF YOU HAVE BEEN REFERRED TO A SPECIALIST, IT MAY TAKE 1-2 WEEKS TO SCHEDULE/PROCESS THE REFERRAL. IF YOU HAVE NOT HEARD FROM US/SPECIALIST IN TWO WEEKS, PLEASE GIVE [name] A CALL AT [phone].   THE PATIENT IS ENCOURAGED TO PRACTICE SOCIAL DISTANCING DUE TO THE COVID-19 PANDEMIC.

## 2021-10-13 NOTE — Patient Instructions (Signed)

## 2021-12-18 ENCOUNTER — Encounter: Payer: Self-pay | Admitting: Nurse Practitioner

## 2021-12-18 ENCOUNTER — Ambulatory Visit (INDEPENDENT_AMBULATORY_CARE_PROVIDER_SITE_OTHER): Payer: Medicare Other | Admitting: Nurse Practitioner

## 2021-12-18 ENCOUNTER — Ambulatory Visit (INDEPENDENT_AMBULATORY_CARE_PROVIDER_SITE_OTHER): Payer: Medicare Other

## 2021-12-18 VITALS — BP 128/70 | HR 65 | Temp 97.9°F | Ht 63.0 in | Wt 183.6 lb

## 2021-12-18 VITALS — BP 138/80 | HR 65 | Temp 97.9°F | Ht 63.0 in | Wt 183.0 lb

## 2021-12-18 DIAGNOSIS — R7309 Other abnormal glucose: Secondary | ICD-10-CM

## 2021-12-18 DIAGNOSIS — Z1231 Encounter for screening mammogram for malignant neoplasm of breast: Secondary | ICD-10-CM | POA: Diagnosis not present

## 2021-12-18 DIAGNOSIS — I119 Hypertensive heart disease without heart failure: Secondary | ICD-10-CM

## 2021-12-18 DIAGNOSIS — I739 Peripheral vascular disease, unspecified: Secondary | ICD-10-CM | POA: Diagnosis not present

## 2021-12-18 DIAGNOSIS — Z0000 Encounter for general adult medical examination without abnormal findings: Secondary | ICD-10-CM

## 2021-12-18 DIAGNOSIS — E6609 Other obesity due to excess calories: Secondary | ICD-10-CM

## 2021-12-18 DIAGNOSIS — E782 Mixed hyperlipidemia: Secondary | ICD-10-CM

## 2021-12-18 DIAGNOSIS — I4811 Longstanding persistent atrial fibrillation: Secondary | ICD-10-CM

## 2021-12-18 DIAGNOSIS — E559 Vitamin D deficiency, unspecified: Secondary | ICD-10-CM

## 2021-12-18 DIAGNOSIS — Z6832 Body mass index (BMI) 32.0-32.9, adult: Secondary | ICD-10-CM

## 2021-12-18 NOTE — Progress Notes (Signed)
?This visit occurred during the SARS-CoV-2 public health emergency.  Safety protocols were in place, including screening questions prior to the visit, additional usage of staff PPE, and extensive cleaning of exam room while observing appropriate contact time as indicated for disinfecting solutions. ? ?Subjective:  ? Sarah Gilbert is a 76 y.o. female who presents for Medicare Annual (Subsequent) preventive examination. ? ?Review of Systems    ? ?Cardiac Risk Factors include: advanced age (>29mn, >>33women);dyslipidemia;hypertension;obesity (BMI >30kg/m2) ? ?   ?Objective:  ?  ?Today's Vitals  ? 12/18/21 0875605/04/23 0901  ?BP: 138/80 128/70  ?Pulse: 65   ?Temp: 97.9 ?F (36.6 ?C)   ?TempSrc: Oral   ?SpO2: 96%   ?Weight: 183 lb 9.6 oz (83.3 kg)   ?Height: '5\' 3"'$  (1.6 m)   ? ?Body mass index is 32.52 kg/m?. ? ? ?  12/18/2021  ?  8:45 AM 07/02/2021  ?  9:40 AM 12/04/2020  ?  9:49 AM 01/07/2020  ?  1:23 PM 01/07/2020  ? 10:00 AM 11/29/2019  ?  9:21 AM 09/29/2018  ?  8:51 AM  ?Advanced Directives  ?Does Patient Have a Medical Advance Directive? No No No No No No No  ?Would patient like information on creating a medical advance directive? No - Patient declined No - Patient declined No - Patient declined No - Patient declined  Yes (MAU/Ambulatory/Procedural Areas - Information given) Yes (MAU/Ambulatory/Procedural Areas - Information given)  ? ? ?Current Medications (verified) ?Outpatient Encounter Medications as of 12/18/2021  ?Medication Sig  ? amLODipine (NORVASC) 10 MG tablet Take 5 mg by mouth daily.   ? apixaban (ELIQUIS) 5 MG TABS tablet Take 1 tablet (5 mg total) by mouth 2 (two) times daily.  ? atorvastatin (LIPITOR) 40 MG tablet Take 1 tablet (40 mg total) by mouth daily.  ? b complex-vitamin c-folic acid (NEPHRO-VITE) 0.8 MG TABS Take 60-300 mg by mouth at bedtime.  ? bacitracin ointment Apply to affected area daily  ? furosemide (LASIX) 40 MG tablet Take 40 mg by mouth daily.  ? magnesium gluconate (MAGONATE) 500  MG tablet Take 500 mg by mouth 2 (two) times daily.  ? metoprolol (LOPRESSOR) 50 MG tablet Take 50 mg by mouth 2 (two) times daily.  ? mycophenolate (CELLCEPT) 250 MG capsule Take 500 mg by mouth 2 (two) times daily.  ? nystatin cream (MYCOSTATIN) Apply 1 application topically 2 (two) times daily.  ? pantoprazole (PROTONIX) 40 MG tablet Take 40 mg by mouth daily.   ? potassium chloride SA (K-DUR,KLOR-CON) 20 MEQ tablet Take 20 mEq by mouth 2 (two) times daily.  ? tacrolimus (PROGRAF) 0.5 MG capsule Take 0.5 mg by mouth 2 (two) times daily. Take one capsule by mouth every evening.  ? tacrolimus (PROGRAF) 1 MG capsule Take 2 mg by mouth 2 (two) times daily. Take 2 capsules by mouth every morning and 1 capsule in the evenings.  ? Vitamin D, Ergocalciferol, (DRISDOL) 1.25 MG (50000 UNIT) CAPS capsule TAKE 1 CAPSULE BY MOUTH 2 TIMES A WEEK  ? ?No facility-administered encounter medications on file as of 12/18/2021.  ? ? ?Allergies (verified) ?Vancomycin and Iohexol  ? ?History: ?Past Medical History:  ?Diagnosis Date  ? Anemia   ? Arthritis   ? [place]rial [place]   ? Bleeding nose   ? history of   ? [place] ([place] disease) [place] [place]   ? History of bronchitis   ? Hyperlipemia   ? Hypertension   ? Joint inflammation   ?  Obesity   ? PONV (postoperative nausea and vomiting)   ? Rotator cuff tear arthropathy of right shoulder 06/18/2015  ? Thyroid disease   ? Wears glasses   ? ?Past Surgical History:  ?Procedure Laterality Date  ? ABDOMINAL HYSTERECTOMY    ? APPENDECTOMY    ? CARDIAC CATHETERIZATION N/A 04/18/2015  ? Procedure: Left Heart Cath and Coronary Angiography;  Surgeon: Charolette Forward, MD;  Location: [facility];  Service: Cardiovascular;  Laterality: N/A;  ? CHOLECYSTECTOMY    ? COLONOSCOPY    ? IR CT HEAD LTD  01/07/2020  ? IR PERCUTANEOUS ART THROMBECTOMY/INFUSION INTRACRANIAL INC DIAG ANGIO  01/07/2020  ? IR US GUIDE VASC ACCESS RIGHT  01/07/2020  ? KIDNEY TRANSPLANT    ? june 2011-[facility]  ? RADIOLOGY  WITH ANESTHESIA N/A 01/07/2020  ? Procedure: IR WITH ANESTHESIA;  Surgeon: Radiologist, Medication, MD;  Location: [facility];  Service: Radiology;  Laterality: N/A;  ? TONSILLECTOMY    ? TOTAL SHOULDER ARTHROPLASTY Right 06/18/2015  ? Procedure: RIGHT TOTAL SHOULDER ARTHROPLASTY;  Surgeon: Marchia Bond, MD;  Location: [facility];  Service: Orthopedics;  Laterality: Right;  ? ?Family History  ?Problem Relation Age of Onset  ? Dementia Mother   ? Cancer Father   ? ?Social History  ? ?Socioeconomic History  ? Marital status: Single  ?  Spouse name: Not on file  ? Number of children: Not on file  ? Years of education: Not on file  ? Highest education level: Not on file  ?Occupational History  ? Occupation: retired  ?Tobacco Use  ? Smoking status: Never  ? Smokeless tobacco: Never  ?Vaping Use  ? Vaping Use: Never used  ?Substance and Sexual Activity  ? Alcohol use: No  ? Drug use: No  ? Sexual activity: Not Currently  ?Other Topics Concern  ? Not on file  ?Social History Narrative  ? Not on file  ? ?Social Determinants of Health  ? ?Financial Resource Strain: Low Risk   ? Difficulty of Paying Living Expenses: Not hard at all  ?Food Insecurity: No Food Insecurity  ? Worried About Charity fundraiser in the Last Year: Never true  ? Ran Out of Food in the Last Year: Never true  ?Transportation Needs: No Transportation Needs  ? Lack of Transportation (Medical): No  ? Lack of Transportation (Non-Medical): No  ?Physical Activity: Inactive  ? Days of Exercise per Week: 0 days  ? Minutes of Exercise per Session: 0 min  ?Stress: No Stress Concern Present  ? Feeling of Stress : Not at all  ?Social Connections: Not on file  ? ? ?Tobacco Counseling ?Counseling given: Not Answered ? ? ?Clinical Intake: ? ?Pre-visit preparation completed: Yes ? ?Pain : No/denies pain ? ?  ? ?Nutritional Status: BMI > 30  Obese ?Nutritional Risks: None ?Diabetes: No ? ?How often do you need to have someone help you when you read instructions, pamphlets, or  other written materials from your doctor or pharmacy?: 1 - Never ?What is the last grade level you completed in school?: 12th grade ? ?Diabetic? no ? ?Interpreter Needed?: No ? ?Information entered by :: NAllen LPN ? ? ?Activities of Daily Living ? ?  12/18/2021  ?  8:47 AM  ?In your present state of health, do you have any difficulty performing the following activities:  ?Hearing? 0  ?Vision? 0  ?Difficulty concentrating or making decisions? 1  ?Walking or climbing stairs? 0  ?Dressing or bathing? 0  ?Doing errands,  shopping? 0  ?Preparing Food and eating ? N  ?Using the Toilet? N  ?In the past six months, have you accidently leaked urine? N  ?Do you have problems with loss of bowel control? N  ?Managing your Medications? N  ?Managing your Finances? N  ?Housekeeping or managing your Housekeeping? N  ? ? ?Patient Care Team: ?[name], FNP as PCP - General (General Practice) ? ?Indicate any recent Medical Services you may have received from other than [facility] providers in the past year (date may be approximate). ? ?   ?Assessment:  ? This is a routine wellness examination for N[place]le. ? ?Hearing/Vision screen ?Vision Screening - Comments:: Regular eye exams,  ? ?Dietary issues and exercise activities discussed: ?Current Exercise Habits: The patient does not participate in regular exercise at present ? ? Goals Addressed   ? ?  ?  ?  ?  ? This Visit's Progress  ?  Patient Stated     ?  12/18/2021, wants to lose more weight ?  ? ?  ? ?Depression Screen ? ?  12/18/2021  ?  8:47 AM 12/04/2020  ?  9:51 AM 02/13/2020  ?  9:44 AM 11/29/2019  ?  9:22 AM 11/16/2019  ?  2:15 PM 12/29/2018  ?  8:48 AM 09/29/2018  ?  8:53 AM  ?PHQ 2/9 Scores  ?PHQ - 2 Score 0 0 0 0 0 0 0  ?PHQ- 9 Score       0  ?  ?Fall Risk ? ?  12/18/2021  ?  8:46 AM 12/04/2020  ?  9:50 AM 11/29/2019  ?  9:22 AM 11/16/2019  ?  2:15 PM 12/29/2018  ?  8:48 AM  ?Fall Risk   ?Falls in the past year? 0 0 0 0 0  ?Number falls in past yr: 0      ?Injury with Fall? 0      ?Risk for fall  due to : Impaired balance/gait;Impaired mobility;Medication side effect Impaired balance/gait;Impaired mobility;Medication side effect Medication side effect    ?Follow up Falls evaluation completed;Educatio

## 2021-12-18 NOTE — Patient Instructions (Signed)
Hypertension, Adult High blood pressure (hypertension) is when the force of blood pumping through the arteries is too strong. The arteries are the blood vessels that carry blood from the heart throughout the body. Hypertension forces the heart to work harder to pump blood and may cause arteries to become narrow or stiff. Untreated or uncontrolled hypertension can lead to a heart attack, heart failure, a stroke, kidney disease, and other problems. A blood pressure reading consists of a higher number over a lower number. Ideally, your blood pressure should be below 120/80. The first ("top") number is called the systolic pressure. It is a measure of the pressure in your arteries as your heart beats. The second ("bottom") number is called the diastolic pressure. It is a measure of the pressure in your arteries as the heart relaxes. What are the causes? The exact cause of this condition is not known. There are some conditions that result in high blood pressure. What increases the risk? Certain factors may make you more likely to develop high blood pressure. Some of these risk factors are under your control, including: Smoking. Not getting enough exercise or physical activity. Being overweight. Having too much fat, sugar, calories, or salt (sodium) in your diet. Drinking too much alcohol. Other risk factors include: Having a personal history of heart disease, diabetes, high cholesterol, or kidney disease. Stress. Having a family history of high blood pressure and high cholesterol. Having obstructive sleep apnea. Age. The risk increases with age. What are the signs or symptoms? High blood pressure may not cause symptoms. Very high blood pressure (hypertensive crisis) may cause: Headache. Fast or irregular heartbeats (palpitations). Shortness of breath. Nosebleed. Nausea and vomiting. Vision changes. Severe chest pain, dizziness, and seizures. How is this diagnosed? This condition is diagnosed by  measuring your blood pressure while you are seated, with your arm resting on a flat surface, your legs uncrossed, and your feet flat on the floor. The cuff of the blood pressure monitor will be placed directly against the skin of your upper arm at the level of your heart. Blood pressure should be measured at least twice using the same arm. Certain conditions can cause a difference in blood pressure between your right and left arms. If you have a high blood pressure reading during one visit or you have normal blood pressure with other risk factors, you may be asked to: Return on a different day to have your blood pressure checked again. Monitor your blood pressure at home for 1 week or longer. If you are diagnosed with hypertension, you may have other blood or imaging tests to help your health care provider understand your overall risk for other conditions. How is this treated? This condition is treated by making healthy lifestyle changes, such as eating healthy foods, exercising more, and reducing your alcohol intake. You may be referred for counseling on a healthy diet and physical activity. Your health care provider may prescribe medicine if lifestyle changes are not enough to get your blood pressure under control and if: Your systolic blood pressure is above 130. Your diastolic blood pressure is above 80. Your personal target blood pressure may vary depending on your medical conditions, your age, and other factors. Follow these instructions at home: Eating and drinking  Eat a diet that is high in fiber and potassium, and low in sodium, added sugar, and fat. An example of this eating plan is called the DASH diet. DASH stands for Dietary Approaches to Stop Hypertension. To eat this way: Eat   plenty of fresh fruits and vegetables. Try to fill one half of your plate at each meal with fruits and vegetables. Eat whole grains, such as whole-wheat pasta, brown rice, or whole-grain bread. Fill about one  fourth of your plate with whole grains. Eat or drink low-fat dairy products, such as skim milk or low-fat yogurt. Avoid fatty cuts of meat, processed or cured meats, and poultry with skin. Fill about one fourth of your plate with lean proteins, such as fish, chicken without skin, beans, eggs, or tofu. Avoid pre-made and processed foods. These tend to be higher in sodium, added sugar, and fat. Reduce your daily sodium intake. Many people with hypertension should eat less than 1,500 mg of sodium a day. Do not drink alcohol if: Your health care provider tells you not to drink. You are pregnant, may be pregnant, or are planning to become pregnant. If you drink alcohol: Limit how much you have to: 0-1 drink a day for women. 0-2 drinks a day for men. Know how much alcohol is in your drink. In the [place] one drink equals one 12 oz bottle of beer (355 mL), one 5 oz glass of wine (148 mL), or one 1 oz glass of hard liquor (44 mL). Lifestyle  Work with your health care provider to maintain a healthy body weight or to lose weight. Ask what an ideal weight is for you. Get at least 30 minutes of exercise that causes your heart to beat faster (aerobic exercise) most days of the week. Activities may include walking, swimming, or biking. Include exercise to strengthen your muscles (resistance exercise), such as Pilates or lifting weights, as part of your weekly exercise routine. Try to do these types of exercises for 30 minutes at least 3 days a week. Do not use any products that contain nicotine or tobacco. These products include cigarettes, chewing tobacco, and vaping devices, such as e-cigarettes. If you need help quitting, ask your health care provider. Monitor your blood pressure at home as told by your health care provider. Keep all follow-up visits. This is important. Medicines Take over-the-counter and prescription medicines only as told by your health care provider. Follow directions carefully. Blood  pressure medicines must be taken as prescribed. Do not skip doses of blood pressure medicine. Doing this puts you at risk for problems and can make the medicine less effective. Ask your health care provider about side effects or reactions to medicines that you should watch for. Contact a health care provider if you: Think you are having a reaction to a medicine you are taking. Have headaches that keep coming back (recurring). Feel dizzy. Have swelling in your ankles. Have trouble with your vision. Get help right away if you: Develop a severe headache or confusion. Have unusual weakness or numbness. Feel faint. Have severe pain in your chest or abdomen. Vomit repeatedly. Have trouble breathing. These symptoms may be an emergency. Get help right away. Call 911. Do not wait to see if the symptoms will go away. Do not drive yourself to the hospital. Summary Hypertension is when the force of blood pumping through your arteries is too strong. If this condition is not controlled, it may put you at risk for serious complications. Your personal target blood pressure may vary depending on your medical conditions, your age, and other factors. For most people, a normal blood pressure is less than 120/80. Hypertension is treated with lifestyle changes, medicines, or a combination of both. Lifestyle changes include losing weight, eating a healthy,   low-sodium diet, exercising more, and limiting alcohol. This information is not intended to replace advice given to you by your health care provider. Make sure you discuss any questions you have with your health care provider. Document Revised: 06/10/2021 Document Reviewed: 06/10/2021 Elsevier Patient Education  2023 [employer]  

## 2021-12-18 NOTE — Progress Notes (Signed)
?Industrial/product designer as a Education administrator for [employer], FNP.,have documented all relevant documentation on the behalf of [name], FNP,as directed by  [name], FNP while in the presence of [name], [place]. ? ?This visit occurred during the SARS-CoV-2 public health emergency.  Safety protocols were in place, including screening questions prior to the visit, additional usage of staff PPE, and extensive cleaning of exam room while observing appropriate contact time as indicated for disinfecting solutions. ? ?Subjective:  ?  ? Patient ID: Sarah Gilbert , female    DOB: 1945-12-30 , 76 y.o.   MRN: [id number] ? ? ?Chief Complaint  ?Patient presents with  ? Hypertension  ? ? ?HPI ? ?Pt presents today for glucose and HTN follow up.  She reports Dr. Collene Mares said she can not be put to sleep and would like to go to a facility that she does not have to be put to sleep through Dr. Terrence Dupont.  ? ?Diabetes ?She presents for her follow-up diabetic visit. She has type 2 diabetes mellitus. There are no hypoglycemic associated symptoms. Pertinent negatives for hypoglycemia include no dizziness or headaches. There are no diabetic associated symptoms. Pertinent negatives for diabetes include no chest pain, no fatigue, no polydipsia, no polyphagia and no polyuria. There are no hypoglycemic complications. There are no diabetic complications. Risk factors for coronary artery disease include obesity and sedentary lifestyle. When asked about current treatments, none were reported. She is following a generally healthy diet. She has not had a previous visit with a dietitian. She rarely participates in exercise. She does not see a podiatrist.Eye exam is not current.   ? ?Past Medical History:  ?Diagnosis Date  ? Anemia   ? Arthritis   ? Atrial fibrillation ([place])   ? Bleeding nose   ? history of   ? ESRD (end stage renal disease) ([place])   ? History of bronchitis   ? Hyperlipemia   ? Hypertension   ? Joint inflammation   ? Obesity   ?  PONV (postoperative nausea and vomiting)   ? Rotator cuff tear arthropathy of right shoulder 06/18/2015  ? Thyroid disease   ? Wears glasses   ?  ? ?Family History  ?Problem Relation Age of Onset  ? Dementia Mother   ? Cancer Father   ? ? ? ?Current Outpatient Medications:  ?  amLODipine (NORVASC) 10 MG tablet, Take 5 mg by mouth daily. , Disp: , Rfl:  ?  apixaban (ELIQUIS) 5 MG TABS tablet, Take 1 tablet (5 mg total) by mouth 2 (two) times daily., Disp: 60 tablet, Rfl: 2 ?  atorvastatin (LIPITOR) 40 MG tablet, Take 1 tablet (40 mg total) by mouth daily., Disp: 30 tablet, Rfl: 2 ?  b complex-vitamin c-folic acid (NEPHRO-VITE) 0.8 MG TABS, Take 60-300 mg by mouth at bedtime., Disp: , Rfl:  ?  bacitracin ointment, Apply to affected area daily, Disp: 28 g, Rfl: 2 ?  furosemide (LASIX) 40 MG tablet, Take 40 mg by mouth daily., Disp: , Rfl:  ?  magnesium gluconate (MAGONATE) 500 MG tablet, Take 500 mg by mouth 2 (two) times daily., Disp: , Rfl:  ?  metoprolol (LOPRESSOR) 50 MG tablet, Take 50 mg by mouth 2 (two) times daily., Disp: , Rfl:  ?  mycophenolate (CELLCEPT) 250 MG capsule, Take 500 mg by mouth 2 (two) times daily., Disp: , Rfl:  ?  nystatin cream (MYCOSTATIN), Apply 1 application topically 2 (two) times daily., Disp: 30 g, Rfl: 2 ?  pantoprazole (PROTONIX)  40 MG tablet, Take 40 mg by mouth daily. , Disp: , Rfl:  ?  potassium chloride SA (K-DUR,KLOR-CON) 20 MEQ tablet, Take 20 mEq by mouth 2 (two) times daily., Disp: , Rfl:  ?  tacrolimus (PROGRAF) 0.5 MG capsule, Take 0.5 mg by mouth 2 (two) times daily. Take one capsule by mouth every evening., Disp: , Rfl:  ?  tacrolimus (PROGRAF) 1 MG capsule, Take 2 mg by mouth 2 (two) times daily. Take 2 capsules by mouth every morning and 1 capsule in the evenings., Disp: , Rfl:  ?  Vitamin D, Ergocalciferol, (DRISDOL) 1.25 MG (50000 UNIT) CAPS capsule, TAKE 1 CAPSULE BY MOUTH 2 TIMES A WEEK, Disp: 12 capsule, Rfl: 2  ? ?Allergies  ?Allergen Reactions  ? Vancomycin Hives,  Itching, Rash and Other (See Comments)  ?  Blistering, swelling, and peeling of feet.  Pt denies any similar reactions around face or neck.  ? Iohexol   ?   Desc: iodine contrast/per dialysis center, Onset Date: [id number] ?  ?  ? ?Review of Systems  ?Constitutional: Negative.  Negative for fatigue.  ?Respiratory: Negative.    ?Cardiovascular: Negative.  Negative for chest pain.  ?Gastrointestinal: Negative.   ?Endocrine: Negative for polydipsia, polyphagia and polyuria.  ?Neurological: Negative.  Negative for dizziness and headaches.  ?Psychiatric/Behavioral: Negative.     ? ?Today's Vitals  ? 12/18/21 0902  ?BP: 138/80  ?Pulse: 65  ?Temp: 97.9 ?F (36.6 ?C)  ?TempSrc: Oral  ?Weight: 183 lb (83 kg)  ?Height: 5' 3"  (1.6 m)  ? ?Body mass index is 32.42 kg/m?.  ?Wt Readings from Last 3 Encounters:  ?12/18/21 183 lb (83 kg)  ?12/18/21 183 lb 9.6 oz (83.3 kg)  ?10/13/21 184 lb (83.5 kg)  ? ? ?Objective:  ?Physical Exam ?Vitals reviewed.  ?Constitutional:   ?   General: She is not in acute distress. ?   Appearance: Normal appearance. She is obese.  ?Cardiovascular:  ?   Rate and Rhythm: Normal rate and regular rhythm.  ?   Pulses: Normal pulses.  ?   Heart sounds: Normal heart sounds. No murmur heard. ?Pulmonary:  ?   Effort: Pulmonary effort is normal. No respiratory distress.  ?   Breath sounds: Normal breath sounds. No wheezing.  ?Musculoskeletal:  ?   Right lower leg: No edema.  ?   Left lower leg: No edema.  ?Skin: ?   General: Skin is warm and dry.  ?   Comments: Upper buttocks with slightly   ?Neurological:  ?   General: No focal deficit present.  ?   Mental Status: She is alert and oriented to person, place, and time.  ?   Cranial Nerves: No cranial nerve deficit.  ?Psychiatric:     ?   Mood and Affect: Mood normal.     ?   Behavior: Behavior normal.     ?   Thought Content: Thought content normal.     ?   Judgment: Judgment normal.  ?  ? ?   ?Assessment And Plan:  ?   ?1. Benign hypertensive heart disease without  heart failure ?Comments: Blood pressure is fairly controlled. Continue current medications ? ?2. Abnormal glucose ?Comments: Diet controlled, continue avoiding sugar intake and eat a low carbohydrate diet ?- Hemoglobin A1c ? ?3. Mixed hyperlipidemia ?Comments: Stable, continue statin tolerating well.  ?- CMP14+EGFR ?- Lipid panel ? ?4. Vitamin D deficiency ?Will check vitamin D level and supplement as needed.    ?Also encouraged to  spend 15 minutes in the sun daily.  ?- VITAMIN D 25 Hydroxy (Vit-D Deficiency, Fractures) ? ?5. Class 1 obesity due to excess calories with body mass index (BMI) of 32.0 to 32.9 in adult, unspecified whether serious comorbidity present ? She is encouraged to strive for BMI less than 30 to decrease cardiac risk. Advised to aim for at least 150 minutes of exercise per week. ? ?6. PAD (peripheral artery disease) ([place]) ?Comments: Continue Eliquis ? ?7. Longstanding persistent atrial fibrillation ([place]) ?Comments: Controlled, continue current medications ? ? ? ?Patient was given opportunity to ask questions. Patient verbalized understanding of the plan and was able to repeat key elements of the plan. All questions were answered to their satisfaction.  ?[name], FNP  ? ?I, [name], FNP, have reviewed all documentation for this visit. The documentation on 12/18/21 for the exam, diagnosis, procedures, and orders are all accurate and complete.  ? ?IF YOU HAVE BEEN REFERRED TO A SPECIALIST, IT MAY TAKE 1-2 WEEKS TO SCHEDULE/PROCESS THE REFERRAL. IF YOU HAVE NOT HEARD FROM US/SPECIALIST IN TWO WEEKS, PLEASE GIVE [name] A CALL AT [phone] X [phone].  ? ?THE PATIENT IS ENCOURAGED TO PRACTICE SOCIAL DISTANCING DUE TO THE COVID-19 PANDEMIC.   ?

## 2021-12-18 NOTE — Patient Instructions (Signed)
Sarah Gilbert , ?Thank you for taking time to come for your Medicare Wellness Visit. I appreciate your ongoing commitment to your health goals. Please review the following plan we discussed and let me know if I can assist you in the future.  ? ?Screening recommendations/referrals: ?Colonoscopy: due ?Mammogram: ordered today ?Bone Density: completed 11/01/2017 ?Recommended yearly ophthalmology/optometry visit for glaucoma screening and checkup ?Recommended yearly dental visit for hygiene and checkup ? ?Vaccinations: ?Influenza vaccine: due 03/17/2022 ?Pneumococcal vaccine: completed 07/09/2021 ?Tdap vaccine: completed 06/04/2014, due 06/04/2024 ?Shingles vaccine: discussed   ?Covid-19: 06/13/2021, 11/20/2020, 05/25/2020, 11/07/2019, 10/13/2019,  ? ?Advanced directives: Advance directive discussed with you today. Even though you declined this today please call our office should you change your mind and we can give you the proper paperwork for you to fill out. ? ?Conditions/risks identified: none ? ?Next appointment: Follow up in one year for your annual wellness visit  ? ? ?Preventive Care 31 Years and Older, Female ?Preventive care refers to lifestyle choices and visits with your health care provider that can promote health and wellness. ?What does preventive care include? ?A yearly physical exam. This is also called an annual well check. ?Dental exams once or twice a year. ?Routine eye exams. Ask your health care provider how often you should have your eyes checked. ?Personal lifestyle choices, including: ?Daily care of your teeth and gums. ?Regular physical activity. ?Eating a healthy diet. ?Avoiding tobacco and drug use. ?Limiting alcohol use. ?Practicing safe sex. ?Taking low-dose aspirin every day. ?Taking vitamin and mineral supplements as recommended by your health care provider. ?What happens during an annual well check? ?The services and screenings done by your health care provider during your annual well check will  depend on your age, overall health, lifestyle risk factors, and family history of disease. ?Counseling  ?Your health care provider may ask you questions about your: ?Alcohol use. ?Tobacco use. ?Drug use. ?Emotional well-being. ?Home and relationship well-being. ?Sexual activity. ?Eating habits. ?History of falls. ?Memory and ability to understand (cognition). ?Work and work Statistician. ?Reproductive health. ?Screening  ?You may have the following tests or measurements: ?Height, weight, and BMI. ?Blood pressure. ?Lipid and cholesterol levels. These may be checked every 5 years, or more frequently if you are over 57 years old. ?Skin check. ?Lung cancer screening. You may have this screening every year starting at age 70 if you have a 30-pack-year history of smoking and currently smoke or have quit within the past 15 years. ?Fecal occult blood test (FOBT) of the stool. You may have this test every year starting at age 62. ?Flexible sigmoidoscopy or colonoscopy. You may have a sigmoidoscopy every 5 years or a colonoscopy every 10 years starting at age 65. ?Hepatitis C blood test. ?Hepatitis B blood test. ?Sexually transmitted disease (STD) testing. ?Diabetes screening. This is done by checking your blood sugar (glucose) after you have not eaten for a while (fasting). You may have this done every 1-3 years. ?Bone density scan. This is done to screen for osteoporosis. You may have this done starting at age 38. ?Mammogram. This may be done every 1-2 years. Talk to your health care provider about how often you should have regular mammograms. ?Talk with your health care provider about your test results, treatment options, and if necessary, the need for more tests. ?Vaccines  ?Your health care provider may recommend certain vaccines, such as: ?Influenza vaccine. This is recommended every year. ?Tetanus, diphtheria, and acellular pertussis (Tdap, Td) vaccine. You may need a Td booster every  10 years. ?Zoster vaccine. You may  need this after age 85. ?Pneumococcal 13-valent conjugate (PCV13) vaccine. One dose is recommended after age 59. ?Pneumococcal polysaccharide (PPSV23) vaccine. One dose is recommended after age 49. ?Talk to your health care provider about which screenings and vaccines you need and how often you need them. ?This information is not intended to replace advice given to you by your health care provider. Make sure you discuss any questions you have with your health care provider. ?Document Released: 08/30/2015 Document Revised: 04/22/2016 Document Reviewed: 06/04/2015 ?Elsevier Interactive Patient Education ? 2017 [place]. ? ?Fall Prevention in the Home ?Falls can cause injuries. They can happen to people of all ages. There are many things you can do to make your home safe and to help prevent falls. ?What can I do on the outside of my home? ?Regularly fix the edges of walkways and driveways and fix any cracks. ?Remove anything that might make you trip as you walk through a door, such as a raised step or threshold. ?Trim any bushes or trees on the path to your home. ?Use bright outdoor lighting. ?Clear any walking paths of anything that might make someone trip, such as rocks or tools. ?Regularly check to see if handrails are loose or broken. Make sure that both sides of any steps have handrails. ?Any raised decks and porches should have guardrails on the edges. ?Have any leaves, snow, or ice cleared regularly. ?Use sand or salt on walking paths during winter. ?Clean up any spills in your garage right away. This includes oil or grease spills. ?What can I do in the bathroom? ?Use night lights. ?Install grab bars by the toilet and in the tub and shower. Do not use towel bars as grab bars. ?Use non-skid mats or decals in the tub or shower. ?If you need to sit down in the shower, use a plastic, non-slip stool. ?Keep the floor dry. Clean up any water that spills on the floor as soon as it happens. ?Remove soap buildup in  the tub or shower regularly. ?Attach bath mats securely with double-sided non-slip rug tape. ?Do not have throw rugs and other things on the floor that can make you trip. ?What can I do in the bedroom? ?Use night lights. ?Make sure that you have a light by your bed that is easy to reach. ?Do not use any sheets or blankets that are too big for your bed. They should not hang down onto the floor. ?Have a firm chair that has side arms. You can use this for support while you get dressed. ?Do not have throw rugs and other things on the floor that can make you trip. ?What can I do in the kitchen? ?Clean up any spills right away. ?Avoid walking on wet floors. ?Keep items that you use a lot in easy-to-reach places. ?If you need to reach something above you, use a strong step stool that has a grab bar. ?Keep electrical cords out of the way. ?Do not use floor polish or wax that makes floors slippery. If you must use wax, use non-skid floor wax. ?Do not have throw rugs and other things on the floor that can make you trip. ?What can I do with my stairs? ?Do not leave any items on the stairs. ?Make sure that there are handrails on both sides of the stairs and use them. Fix handrails that are broken or loose. Make sure that handrails are as long as the stairways. ?Check any carpeting to make  sure that it is firmly attached to the stairs. Fix any carpet that is loose or worn. ?Avoid having throw rugs at the top or bottom of the stairs. If you do have throw rugs, attach them to the floor with carpet tape. ?Make sure that you have a light switch at the top of the stairs and the bottom of the stairs. If you do not have them, ask someone to add them for you. ?What else can I do to help prevent falls? ?Wear shoes that: ?Do not have high heels. ?Have rubber bottoms. ?Are comfortable and fit you well. ?Are closed at the toe. Do not wear sandals. ?If you use a stepladder: ?Make sure that it is fully opened. Do not climb a closed  stepladder. ?Make sure that both sides of the stepladder are locked into place. ?Ask someone to hold it for you, if possible. ?Clearly mark and make sure that you can see: ?Any grab bars or handrails. ?First and l

## 2021-12-19 LAB — CMP14+EGFR
ALT: 11 IU/L (ref 0–32)
AST: 20 IU/L (ref 0–40)
Albumin/Globulin Ratio: 1.3 (ref 1.2–2.2)
Albumin: 4.4 g/dL (ref 3.7–4.7)
Alkaline Phosphatase: 101 IU/L (ref 44–121)
BUN/Creatinine Ratio: 15 (ref 12–28)
BUN: 13 mg/dL (ref 8–27)
Bilirubin Total: 1 mg/dL (ref 0.0–1.2)
CO2: 23 mmol/L (ref 20–29)
Calcium: 9.9 mg/dL (ref 8.7–10.3)
Chloride: 103 mmol/L (ref 96–106)
Creatinine, Ser: 0.88 mg/dL (ref 0.57–1.00)
Globulin, Total: 3.4 g/dL (ref 1.5–4.5)
Glucose: 93 mg/dL (ref 70–99)
Potassium: 3.4 mmol/L — ABNORMAL LOW (ref 3.5–5.2)
Sodium: 141 mmol/L (ref 134–144)
Total Protein: 7.8 g/dL (ref 6.0–8.5)
eGFR: 69 mL/min/{1.73_m2} (ref >59)

## 2021-12-19 LAB — LIPID PANEL
Chol/HDL Ratio: 1.9 ratio (ref 0.0–4.4)
Cholesterol, Total: 170 mg/dL (ref 100–199)
HDL: 90 mg/dL (ref >39)
LDL Chol Calc (NIH): 70 mg/dL (ref 0–99)
Triglycerides: 45 mg/dL (ref 0–149)
VLDL Cholesterol Cal: 10 mg/dL (ref 5–40)

## 2021-12-19 LAB — HEMOGLOBIN A1C
Est. average glucose Bld gHb Est-mCnc: 123 mg/dL
Hgb A1c MFr Bld: 5.9 % — ABNORMAL HIGH (ref 4.8–5.6)

## 2021-12-19 LAB — VITAMIN D 25 HYDROXY (VIT D DEFICIENCY, FRACTURES): Vit D, 25-Hydroxy: 23.3 ng/mL — ABNORMAL LOW (ref 30.0–100.0)

## 2021-12-25 ENCOUNTER — Ambulatory Visit
Admission: RE | Admit: 2021-12-25 | Discharge: 2021-12-25 | Disposition: A | Discharge status: Home / Self Care | Payer: Medicare Other | Source: Ambulatory Visit | Attending: Nurse Practitioner | Admitting: Nurse Practitioner

## 2021-12-25 DIAGNOSIS — Z1231 Encounter for screening mammogram for malignant neoplasm of breast: Secondary | ICD-10-CM

## 2021-12-26 ENCOUNTER — Other Ambulatory Visit: Payer: Self-pay | Admitting: Nurse Practitioner

## 2021-12-26 DIAGNOSIS — E559 Vitamin D deficiency, unspecified: Secondary | ICD-10-CM

## 2021-12-26 MED ORDER — VITAMIN D (ERGOCALCIFEROL) 1.25 MG (50000 UNIT) PO CAPS: ORAL_CAPSULE | ORAL | 1 refills | Status: DC

## 2022-02-07 ENCOUNTER — Observation Stay (HOSPITAL_COMMUNITY)
Admission: EM | Admit: 2022-02-07 | Discharge: 2022-02-09 | Disposition: A | Discharge status: Home / Self Care | Payer: Medicare Other | Attending: Internal Medicine | Admitting: Internal Medicine

## 2022-02-07 ENCOUNTER — Emergency Department (HOSPITAL_COMMUNITY): Payer: Medicare Other

## 2022-02-07 ENCOUNTER — Encounter (HOSPITAL_COMMUNITY): Payer: Self-pay | Admitting: Emergency Medicine

## 2022-02-07 DIAGNOSIS — R4701 Aphasia: Principal | ICD-10-CM

## 2022-02-07 DIAGNOSIS — Z7901 Long term (current) use of anticoagulants: Secondary | ICD-10-CM | POA: Diagnosis not present

## 2022-02-07 DIAGNOSIS — N186 End stage renal disease: Secondary | ICD-10-CM | POA: Insufficient documentation

## 2022-02-07 DIAGNOSIS — R29898 Other symptoms and signs involving the musculoskeletal system: Secondary | ICD-10-CM | POA: Diagnosis not present

## 2022-02-07 DIAGNOSIS — I10 Essential (primary) hypertension: Secondary | ICD-10-CM | POA: Diagnosis present

## 2022-02-07 DIAGNOSIS — I4819 Other persistent atrial fibrillation: Secondary | ICD-10-CM

## 2022-02-07 DIAGNOSIS — Z940 Kidney transplant status: Secondary | ICD-10-CM

## 2022-02-07 DIAGNOSIS — I120 Hypertensive chronic kidney disease with stage 5 chronic kidney disease or end stage renal disease: Secondary | ICD-10-CM | POA: Insufficient documentation

## 2022-02-07 DIAGNOSIS — Z79899 Other long term (current) drug therapy: Secondary | ICD-10-CM | POA: Diagnosis not present

## 2022-02-07 DIAGNOSIS — Z96611 Presence of right artificial shoulder joint: Secondary | ICD-10-CM | POA: Diagnosis not present

## 2022-02-07 DIAGNOSIS — E876 Hypokalemia: Secondary | ICD-10-CM | POA: Diagnosis not present

## 2022-02-07 DIAGNOSIS — I4891 Unspecified atrial fibrillation: Secondary | ICD-10-CM | POA: Diagnosis not present

## 2022-02-07 DIAGNOSIS — G459 Transient cerebral ischemic attack, unspecified: Secondary | ICD-10-CM

## 2022-02-07 DIAGNOSIS — I482 Chronic atrial fibrillation, unspecified: Secondary | ICD-10-CM

## 2022-02-07 DIAGNOSIS — R2681 Unsteadiness on feet: Secondary | ICD-10-CM | POA: Insufficient documentation

## 2022-02-07 DIAGNOSIS — M6281 Muscle weakness (generalized): Secondary | ICD-10-CM | POA: Insufficient documentation

## 2022-02-07 LAB — I-STAT CHEM 8, ED
BUN: 17 mg/dL (ref 8–23)
Calcium, Ion: 1.2 mmol/L (ref 1.15–1.40)
Chloride: 105 mmol/L (ref 98–111)
Creatinine, Ser: 0.9 mg/dL (ref 0.44–1.00)
Glucose, Bld: 116 mg/dL — ABNORMAL HIGH (ref 70–99)
HCT: 37 % (ref 36.0–46.0)
Hemoglobin: 12.6 g/dL (ref 12.0–15.0)
Potassium: 3.3 mmol/L — ABNORMAL LOW (ref 3.5–5.1)
Sodium: 142 mmol/L (ref 135–145)
TCO2: 24 mmol/L (ref 22–32)

## 2022-02-07 LAB — CBC
HCT: 37.9 % (ref 36.0–46.0)
Hemoglobin: 12.4 g/dL (ref 12.0–15.0)
MCH: 30.8 pg (ref 26.0–34.0)
MCHC: 32.7 g/dL (ref 30.0–36.0)
MCV: 94 fL (ref 80.0–100.0)
Platelets: 221 10*3/uL (ref 150–400)
RBC: 4.03 MIL/uL (ref 3.87–5.11)
RDW: 13.2 % (ref 11.5–15.5)
WBC: 5.7 10*3/uL (ref 4.0–10.5)
nRBC: 0 % (ref 0.0–0.2)

## 2022-02-07 LAB — COMPREHENSIVE METABOLIC PANEL
ALT: 18 U/L (ref 0–44)
AST: 24 U/L (ref 15–41)
Albumin: 3.7 g/dL (ref 3.5–5.0)
Alkaline Phosphatase: 72 U/L (ref 38–126)
Anion gap: 12 (ref 5–15)
BUN: 15 mg/dL (ref 8–23)
CO2: 22 mmol/L (ref 22–32)
Calcium: 9.4 mg/dL (ref 8.9–10.3)
Chloride: 107 mmol/L (ref 98–111)
Creatinine, Ser: 0.98 mg/dL (ref 0.44–1.00)
GFR, Estimated: >60 mL/min (ref >60)
Glucose, Bld: 120 mg/dL — ABNORMAL HIGH (ref 70–99)
Potassium: 3.3 mmol/L — ABNORMAL LOW (ref 3.5–5.1)
Sodium: 141 mmol/L (ref 135–145)
Total Bilirubin: 0.7 mg/dL (ref 0.3–1.2)
Total Protein: 7 g/dL (ref 6.5–8.1)

## 2022-02-07 LAB — DIFFERENTIAL
Abs Immature Granulocytes: 0.04 10*3/uL (ref 0.00–0.07)
Basophils Absolute: 0 10*3/uL (ref 0.0–0.1)
Basophils Relative: 1 %
Eosinophils Absolute: 0.1 10*3/uL (ref 0.0–0.5)
Eosinophils Relative: 2 %
Immature Granulocytes: 1 %
Lymphocytes Relative: 21 %
Lymphs Abs: 1.2 10*3/uL (ref 0.7–4.0)
Monocytes Absolute: 0.4 10*3/uL (ref 0.1–1.0)
Monocytes Relative: 7 %
Neutro Abs: 4 10*3/uL (ref 1.7–7.7)
Neutrophils Relative %: 68 %

## 2022-02-07 LAB — ETHANOL: Alcohol, Ethyl (B): <10 mg/dL (ref <10)

## 2022-02-07 LAB — PROTIME-INR
INR: 1.7 — ABNORMAL HIGH (ref 0.8–1.2)
Prothrombin Time: 19.9 seconds — ABNORMAL HIGH (ref 11.4–15.2)

## 2022-02-07 LAB — APTT: aPTT: 34 seconds (ref 24–36)

## 2022-02-07 LAB — CBG MONITORING, ED: Glucose-Capillary: 120 mg/dL — ABNORMAL HIGH (ref 70–99)

## 2022-02-07 MED ORDER — STROKE: EARLY STAGES OF RECOVERY BOOK
Freq: Once | Status: AC
  Administered 2022-02-08: 1
  Filled 2022-02-07: qty 1

## 2022-02-07 MED ORDER — PANTOPRAZOLE SODIUM 40 MG PO TBEC
40.0000 mg | DELAYED_RELEASE_TABLET | Freq: Every day | ORAL | Status: DC
  Administered 2022-02-08 – 2022-02-09 (×2): 40 mg via ORAL
  Filled 2022-02-07 (×2): qty 1

## 2022-02-07 MED ORDER — ACETAMINOPHEN 650 MG RE SUPP: 650.0000 mg | RECTAL | Status: DC | PRN

## 2022-02-07 MED ORDER — SODIUM CHLORIDE 0.9% FLUSH: 3.0000 mL | Freq: Once | INTRAVENOUS | Status: DC

## 2022-02-07 MED ORDER — SODIUM CHLORIDE 0.9% FLUSH: 3.0000 mL | INTRAVENOUS | Status: DC | PRN

## 2022-02-07 MED ORDER — ATORVASTATIN CALCIUM 40 MG PO TABS
40.0000 mg | ORAL_TABLET | Freq: Every day | ORAL | Status: DC
  Administered 2022-02-08 – 2022-02-09 (×2): 40 mg via ORAL
  Filled 2022-02-07 (×2): qty 1

## 2022-02-07 MED ORDER — ACETAMINOPHEN 160 MG/5ML PO SOLN: 650.0000 mg | ORAL | Status: DC | PRN

## 2022-02-07 MED ORDER — SODIUM CHLORIDE 0.9% FLUSH
3.0000 mL | Freq: Two times a day (BID) | INTRAVENOUS | Status: DC
  Administered 2022-02-07 – 2022-02-09 (×4): 3 mL via INTRAVENOUS

## 2022-02-07 MED ORDER — APIXABAN 5 MG PO TABS
5.0000 mg | ORAL_TABLET | Freq: Two times a day (BID) | ORAL | Status: DC
  Administered 2022-02-07 – 2022-02-09 (×4): 5 mg via ORAL
  Filled 2022-02-07 (×4): qty 1

## 2022-02-07 MED ORDER — TACROLIMUS 0.5 MG PO CAPS
0.5000 mg | ORAL_CAPSULE | Freq: Two times a day (BID) | ORAL | Status: DC
  Administered 2022-02-07 – 2022-02-08 (×2): 0.5 mg via ORAL
  Filled 2022-02-07 (×3): qty 1

## 2022-02-07 MED ORDER — POTASSIUM CHLORIDE CRYS ER 20 MEQ PO TBCR
20.0000 meq | EXTENDED_RELEASE_TABLET | Freq: Once | ORAL | Status: AC
Start: 2022-02-07 — End: 2022-02-07
  Administered 2022-02-07: 20 meq via ORAL
  Filled 2022-02-07: qty 1

## 2022-02-07 MED ORDER — MYCOPHENOLATE MOFETIL 250 MG PO CAPS
500.0000 mg | ORAL_CAPSULE | Freq: Two times a day (BID) | ORAL | Status: DC
  Administered 2022-02-07 – 2022-02-09 (×4): 500 mg via ORAL
  Filled 2022-02-07 (×5): qty 2

## 2022-02-07 MED ORDER — TACROLIMUS 1 MG PO CAPS
2.0000 mg | ORAL_CAPSULE | Freq: Two times a day (BID) | ORAL | Status: DC
  Administered 2022-02-07 – 2022-02-09 (×4): 2 mg via ORAL
  Filled 2022-02-07 (×4): qty 2

## 2022-02-07 MED ORDER — ACETAMINOPHEN 325 MG PO TABS: 650.0000 mg | ORAL_TABLET | ORAL | Status: DC | PRN

## 2022-02-07 MED ORDER — SODIUM CHLORIDE 0.9 % IV SOLN: 250.0000 mL | INTRAVENOUS | Status: DC | PRN

## 2022-02-07 MED ORDER — SENNOSIDES-DOCUSATE SODIUM 8.6-50 MG PO TABS: 1.0000 | ORAL_TABLET | Freq: Every evening | ORAL | Status: DC | PRN

## 2022-02-07 NOTE — ED Triage Notes (Signed)
Pt here  from home with c/o code stroke lsn 1905 , only symptoms were  speech difficulty, last about 5 mins two different times, back to normal on arrival to the ED

## 2022-02-07 NOTE — Progress Notes (Signed)
EEG complete - results pending 

## 2022-02-07 NOTE — ED Notes (Signed)
Patient transported to MRI 

## 2022-02-08 ENCOUNTER — Observation Stay (HOSPITAL_BASED_OUTPATIENT_CLINIC_OR_DEPARTMENT_OTHER): Payer: Medicare Other

## 2022-02-08 ENCOUNTER — Other Ambulatory Visit: Payer: Self-pay

## 2022-02-08 ENCOUNTER — Observation Stay (HOSPITAL_COMMUNITY): Payer: Medicare Other

## 2022-02-08 DIAGNOSIS — E876 Hypokalemia: Secondary | ICD-10-CM

## 2022-02-08 DIAGNOSIS — G459 Transient cerebral ischemic attack, unspecified: Secondary | ICD-10-CM

## 2022-02-08 DIAGNOSIS — I482 Chronic atrial fibrillation, unspecified: Secondary | ICD-10-CM | POA: Diagnosis not present

## 2022-02-08 DIAGNOSIS — Z940 Kidney transplant status: Secondary | ICD-10-CM | POA: Diagnosis not present

## 2022-02-08 DIAGNOSIS — R4701 Aphasia: Secondary | ICD-10-CM | POA: Diagnosis not present

## 2022-02-08 LAB — ECHOCARDIOGRAM COMPLETE
AR max vel: 2.11 cm2
AV Area VTI: 1.97 cm2
AV Area mean vel: 2.05 cm2
AV Mean grad: 7 mmHg
AV Peak grad: 13.4 mmHg
Ao pk vel: 1.83 m/s
Area-P 1/2: 3.36 cm2
Height: 63 in
MV VTI: 2.73 cm2
S' Lateral: 2.5 cm
Weight: 3114.66 oz

## 2022-02-08 LAB — LIPID PANEL
Cholesterol: 140 mg/dL (ref 0–200)
HDL: 76 mg/dL (ref >40)
LDL Cholesterol: 59 mg/dL (ref 0–99)
Total CHOL/HDL Ratio: 1.8 RATIO
Triglycerides: 23 mg/dL (ref <150)
VLDL: 5 mg/dL (ref 0–40)

## 2022-02-08 LAB — BASIC METABOLIC PANEL
Anion gap: 10 (ref 5–15)
BUN: 13 mg/dL (ref 8–23)
CO2: 26 mmol/L (ref 22–32)
Calcium: 9.1 mg/dL (ref 8.9–10.3)
Chloride: 106 mmol/L (ref 98–111)
Creatinine, Ser: 0.81 mg/dL (ref 0.44–1.00)
GFR, Estimated: >60 mL/min (ref >60)
Glucose, Bld: 110 mg/dL — ABNORMAL HIGH (ref 70–99)
Potassium: 2.9 mmol/L — ABNORMAL LOW (ref 3.5–5.1)
Sodium: 142 mmol/L (ref 135–145)

## 2022-02-08 LAB — CBC
HCT: 35.7 % — ABNORMAL LOW (ref 36.0–46.0)
Hemoglobin: 11.7 g/dL — ABNORMAL LOW (ref 12.0–15.0)
MCH: 30.2 pg (ref 26.0–34.0)
MCHC: 32.8 g/dL (ref 30.0–36.0)
MCV: 92.2 fL (ref 80.0–100.0)
Platelets: 203 10*3/uL (ref 150–400)
RBC: 3.87 MIL/uL (ref 3.87–5.11)
RDW: 13.2 % (ref 11.5–15.5)
WBC: 5.7 10*3/uL (ref 4.0–10.5)
nRBC: 0 % (ref 0.0–0.2)

## 2022-02-08 LAB — RAPID URINE DRUG SCREEN, HOSP PERFORMED
Amphetamines: NOT DETECTED
Barbiturates: NOT DETECTED
Benzodiazepines: NOT DETECTED
Cocaine: NOT DETECTED
Opiates: NOT DETECTED
Tetrahydrocannabinol: NOT DETECTED

## 2022-02-08 LAB — HEMOGLOBIN A1C
Hgb A1c MFr Bld: 5.7 % — ABNORMAL HIGH (ref 4.8–5.6)
Mean Plasma Glucose: 116.89 mg/dL

## 2022-02-08 LAB — MAGNESIUM: Magnesium: 1.3 mg/dL — ABNORMAL LOW (ref 1.7–2.4)

## 2022-02-08 MED ORDER — MAGNESIUM SULFATE 4 GM/100ML IV SOLN
4.0000 g | Freq: Once | INTRAVENOUS | Status: AC
Start: 2022-02-08 — End: 2022-02-08
  Administered 2022-02-08: 4 g via INTRAVENOUS
  Filled 2022-02-08 (×2): qty 100

## 2022-02-08 MED ORDER — TACROLIMUS 0.5 MG PO CAPS
0.5000 mg | ORAL_CAPSULE | Freq: Every day | ORAL | Status: DC
  Filled 2022-02-08: qty 1

## 2022-02-08 MED ORDER — POTASSIUM CHLORIDE CRYS ER 20 MEQ PO TBCR
40.0000 meq | EXTENDED_RELEASE_TABLET | Freq: Two times a day (BID) | ORAL | Status: AC
  Administered 2022-02-08 (×2): 40 meq via ORAL
  Filled 2022-02-08 (×2): qty 2

## 2022-02-08 NOTE — Evaluation (Signed)
Occupational Therapy Evaluation Patient Details Name: Sarah Gilbert MRN: [id number] DOB: [birth year] Today's Date: Sarah Gilbert   History of Present Illness 76 y.o. female presenting to the emergency department 02/07/22 after 2 self-limited episodes of speech difficulty. No acute findings on head CT, MRI brain, or MRA head; EEG negative  PMH significant for ESRD status post renal transplant, atrial fibrillation on Eliquis, and hypertension   Clinical Impression   Pt admitted for concerns listed above. PTA pt reported that she was independent with all ADL's and IADL's, including driving and grocery shopping. At this time, pt appears to be back at her baseline. She is able to complete all ADL's and functional mobility with no assist. Continues to have no recall of aphasic events, however she otherwise appears at baseline, [facility], for cognition. She has no further skilled OT needs and acute OT will sign off.       Recommendations for follow up therapy are one component of a multi-disciplinary discharge planning process, led by the attending physician.  Recommendations may be updated based on patient status, additional functional criteria and insurance authorization.   Follow Up Recommendations  No OT follow up    Assistance Recommended at Discharge PRN  Patient can return home with the following Assistance with cooking/housework;Direct supervision/assist for medications management    Functional Status Assessment  Patient has had a recent decline in their functional status and demonstrates the ability to make significant improvements in function in a reasonable and predictable amount of time.  Equipment Recommendations  None recommended by OT    Recommendations for Other Services       Precautions / Restrictions Precautions Precautions: None Restrictions Weight Bearing Restrictions: No      Mobility Bed Mobility Overal bed mobility: Independent                   Transfers Overall transfer level: Needs assistance Equipment used: 1 person hand held assist, None (simulating cane) Transfers: Sit to/from Stand Sit to Stand: Supervision           General transfer comment: supervision for safety from EOB and toilet      Balance                                           ADL either performed or assessed with clinical judgement   ADL Overall ADL's : At baseline;Modified independent                                       General ADL Comments: Able to complete all ADL's with no assist, functional mobility mod I with cane or 1 HHA     Vision Baseline Vision/History: 1 Wears glasses Ability to See in Adequate Light: 0 Adequate Patient Visual Report: No change from baseline Vision Assessment?: No apparent visual deficits     Perception     Praxis      Pertinent Vitals/Pain Pain Assessment Pain Assessment: No/denies pain     Hand Dominance Right   Extremity/Trunk Assessment Upper Extremity Assessment Upper Extremity Assessment: Overall WFL for tasks assessed   Lower Extremity Assessment Lower Extremity Assessment: Overall WFL for tasks assessed   Cervical / Trunk Assessment Cervical / Trunk Assessment: Normal   Communication Communication Communication: No difficulties   Cognition Arousal/Alertness: Awake/alert Behavior During  Therapy: WFL for tasks assessed/performed Overall Cognitive Status: Within Functional Limits for tasks assessed                                       General Comments  VSS on RA - session cut short as transport entered to take pt off floor    Exercises     Shoulder Instructions      Home Living Family/patient expects to be discharged to:: Private residence Living Arrangements: Other relatives (uncle) Available Help at Discharge: Family;Available 24 hours/day Type of Home: House Home Access: Ramped entrance     Home Layout: One level      Bathroom Shower/Tub: Chief Strategy Officer: Standard     Home Equipment: Medical laboratory scientific officer - single point;BSC/3in1      Lives With: Family    Prior Functioning/Environment Prior Level of Function : Independent/Modified Independent             Mobility Comments: walks with cane "because it was my grandmother's, but now I guess I actually need it"          OT Problem List: Decreased activity tolerance;Impaired balance (sitting and/or standing)      OT Treatment/Interventions:      OT Goals(Current goals can be found in the care plan section) Acute Rehab OT Goals Patient Stated Goal: To go home OT Goal Formulation: With patient Time For Goal Achievement: 02/22/22 Potential to Achieve Goals: Good  OT Frequency:      Co-evaluation              AM-PAC OT "6 Clicks" Daily Activity     Outcome Measure Help from another person eating meals?: None Help from another person taking care of personal grooming?: None Help from another person toileting, which includes using toliet, bedpan, or urinal?: None Help from another person bathing (including washing, rinsing, drying)?: None Help from another person to put on and taking off regular upper body clothing?: None Help from another person to put on and taking off regular lower body clothing?: None 6 Click Score: 24   End of Session Equipment Utilized During Treatment: Gait belt Nurse Communication: Mobility status  Activity Tolerance: Patient tolerated treatment well Patient left: in bed (transport in room)  OT Visit Diagnosis: Unsteadiness on feet (R26.81);Muscle weakness (generalized) (M62.81);Other symptoms and signs involving the nervous system (R29.898)                Time: 1127-1140 OT Time Calculation (min): 13 min Charges:  OT General Charges $OT Visit: 1 Visit OT Evaluation $OT Eval Low Complexity: 1 Low  Sarah Gilbert H., OTR/L Acute Rehabilitation  Sarah Gilbert Sarah Gilbert, 3:06 PM

## 2022-02-08 NOTE — Progress Notes (Signed)
Physical Therapy Evaluation and Discharge Patient Details Name: Japneet Thunder MRN: [id number] DOB: 1945-11-04 Today's Date: 02/08/2022  History of Present Illness  76 y.o. female presenting to the emergency department 02/07/22 after 2 self-limited episodes of speech difficulty. No acute findings on head CT, MRI brain, or MRA head; EEG negative  PMH significant for ESRD status post renal transplant, atrial fibrillation on Eliquis, and hypertension  Clinical Impression   Patient evaluated by Physical Therapy with no further acute PT needs identified. Patient ambulates with a cane PTA. Performed gait with some caution and hand-held assist (due to lack of cane). Patient without imbalance during gait or during select items from [facility] Assessment.  PT is signing off. Thank you for this referral.        Recommendations for follow up therapy are one component of a multi-disciplinary discharge planning process, led by the attending physician.  Recommendations may be updated based on patient status, additional functional criteria and insurance authorization.  Follow Up Recommendations No PT follow up      Assistance Recommended at Discharge PRN  Patient can return home with the following       Equipment Recommendations None recommended by PT  Recommendations for Other Services       Functional Status Assessment Patient has not had a recent decline in their functional status     Precautions / Restrictions Precautions Precautions: None Restrictions Weight Bearing Restrictions: No      Mobility  Bed Mobility Overal bed mobility: Independent                  Transfers Overall transfer level: Needs assistance Equipment used: 1 person hand held assist, None (simulating cane) Transfers: Sit to/from Stand Sit to Stand: Min guard, Supervision           General transfer comment: from EOB and from toilet    Ambulation/Gait Ambulation/Gait assistance: Min  assist Gait Distance (Feet): 180 Feet Assistive device: 1 person hand held assist (on rt simulating cane) Gait Pattern/deviations: Step-through pattern, Decreased stride length, Wide base of support   Gait velocity interpretation: >2.62 ft/sec, indicative of community ambulatory   General Gait Details: pt is cautious and reports she would feel more secure with her cane  Stairs            Wheelchair Mobility    Modified Rankin (Stroke Patients Only) Modified Rankin (Stroke Patients Only) Pre-Morbid Rankin Score: No symptoms Modified Rankin: No symptoms     Balance                                 Standardized Balance Assessment Standardized Balance Assessment : Berg Balance Test Berg Balance Test Sit to Stand: Able to stand without using hands and stabilize independently Standing Unsupported: Able to stand safely 2 minutes Sitting with Back Unsupported but Feet Supported on Floor or Stool: Able to sit safely and securely 2 minutes Stand to Sit: Sits safely with minimal use of hands Transfers: Able to transfer safely, minor use of hands Standing Unsupported with Eyes Closed: Able to stand 10 seconds safely Standing Ubsupported with Feet Together: Able to place feet together independently but unable to hold for 30 seconds (feet as close together as knees allow) From Standing, Reach Forward with Outstretched Arm: Can reach forward >5 cm safely (2") Standing Unsupported, One Foot in Front: Able to take small step independently and hold 30 seconds  Pertinent Vitals/Pain Pain Assessment Pain Assessment: No/denies pain    Home Living Family/patient expects to be discharged to:: Private residence Living Arrangements: Other relatives (uncle) Available Help at Discharge: Family;Available 24 hours/day Type of Home: House Home Access: Ramped entrance       Home Layout: One level Home Equipment: Cane - single point;BSC/3in1      Prior Function Prior  Level of Function : Independent/Modified Independent             Mobility Comments: walks with cane "because it was my grandmother's, but now I guess I actually need it"       Hand Dominance   Dominant Hand: Right    Extremity/Trunk Assessment   Upper Extremity Assessment Upper Extremity Assessment: Defer to OT evaluation    Lower Extremity Assessment Lower Extremity Assessment: Overall WFL for tasks assessed (bil knee edema with difficulty maintaining narrow BOS as "knees knock together")    Cervical / Trunk Assessment Cervical / Trunk Assessment: Normal  Communication   Communication: No difficulties  Cognition Arousal/Alertness: Awake/alert Behavior During Therapy: WFL for tasks assessed/performed Overall Cognitive Status: Within Functional Limits for tasks assessed                                          General Comments General comments (skin integrity, edema, etc.): Full Berg not completed. Patient steady with items completed    Exercises     Assessment/Plan    PT Assessment Patient does not need any further PT services  PT Problem List         PT Treatment Interventions      PT Goals (Current goals can be found in the Care Plan section)  Acute Rehab PT Goals PT Goal Formulation: All assessment and education complete, DC therapy    Frequency       Co-evaluation               AM-PAC PT "6 Clicks" Mobility  Outcome Measure Help needed turning from your back to your side while in a flat bed without using bedrails?: None Help needed moving from lying on your back to sitting on the side of a flat bed without using bedrails?: None Help needed moving to and from a bed to a chair (including a wheelchair)?: None Help needed standing up from a chair using your arms (e.g., wheelchair or bedside chair)?: None Help needed to walk in hospital room?: A Little Help needed climbing 3-5 steps with a railing? : A Little 6 Click Score:  22    End of Session   Activity Tolerance: Patient tolerated treatment well Patient left: in bed;with call bell/phone within reach   PT Visit Diagnosis: Other abnormalities of gait and mobility (R26.89)    Time: 8469-6295 PT Time Calculation (min) (ACUTE ONLY): 16 min   Charges:   PT Evaluation $PT Eval Low Complexity: 1 Low           Jerolyn Center, PT Acute Rehabilitation Services  Office [phone]   Zena Amos 02/08/2022, 1:36 PM

## 2022-02-08 NOTE — Progress Notes (Signed)
PT Cancellation Note  Patient Details Name: Sarah Gilbert MRN: [id number] DOB: August 05, 1946   Cancelled Treatment:    Reason Eval/Treat Not Completed: Patient at procedure or test/unavailable   Jerolyn Center, PT Acute Rehabilitation Services  Office [phone]  Zena Amos 02/08/2022, 9:40 AM

## 2022-02-09 ENCOUNTER — Telehealth: Payer: Self-pay

## 2022-02-09 DIAGNOSIS — R4701 Aphasia: Secondary | ICD-10-CM | POA: Diagnosis not present

## 2022-02-09 LAB — COMPREHENSIVE METABOLIC PANEL
ALT: 14 U/L (ref 0–44)
AST: 20 U/L (ref 15–41)
Albumin: 3.3 g/dL — ABNORMAL LOW (ref 3.5–5.0)
Alkaline Phosphatase: 63 U/L (ref 38–126)
Anion gap: 7 (ref 5–15)
BUN: 9 mg/dL (ref 8–23)
CO2: 23 mmol/L (ref 22–32)
Calcium: 8.9 mg/dL (ref 8.9–10.3)
Chloride: 108 mmol/L (ref 98–111)
Creatinine, Ser: 0.74 mg/dL (ref 0.44–1.00)
GFR, Estimated: >60 mL/min (ref >60)
Glucose, Bld: 97 mg/dL (ref 70–99)
Potassium: 3.1 mmol/L — ABNORMAL LOW (ref 3.5–5.1)
Sodium: 138 mmol/L (ref 135–145)
Total Bilirubin: 1.1 mg/dL (ref 0.3–1.2)
Total Protein: 6.7 g/dL (ref 6.5–8.1)

## 2022-02-09 LAB — CBC WITH DIFFERENTIAL/PLATELET
Abs Immature Granulocytes: 0.02 10*3/uL (ref 0.00–0.07)
Basophils Absolute: 0 10*3/uL (ref 0.0–0.1)
Basophils Relative: 0 %
Eosinophils Absolute: 0.1 10*3/uL (ref 0.0–0.5)
Eosinophils Relative: 1 %
HCT: 35.6 % — ABNORMAL LOW (ref 36.0–46.0)
Hemoglobin: 11.6 g/dL — ABNORMAL LOW (ref 12.0–15.0)
Immature Granulocytes: 0 %
Lymphocytes Relative: 28 %
Lymphs Abs: 1.6 10*3/uL (ref 0.7–4.0)
MCH: 30.1 pg (ref 26.0–34.0)
MCHC: 32.6 g/dL (ref 30.0–36.0)
MCV: 92.2 fL (ref 80.0–100.0)
Monocytes Absolute: 0.6 10*3/uL (ref 0.1–1.0)
Monocytes Relative: 10 %
Neutro Abs: 3.3 10*3/uL (ref 1.7–7.7)
Neutrophils Relative %: 61 %
Platelets: 216 10*3/uL (ref 150–400)
RBC: 3.86 MIL/uL — ABNORMAL LOW (ref 3.87–5.11)
RDW: 13.1 % (ref 11.5–15.5)
WBC: 5.6 10*3/uL (ref 4.0–10.5)
nRBC: 0 % (ref 0.0–0.2)

## 2022-02-09 LAB — MAGNESIUM: Magnesium: 1.8 mg/dL (ref 1.7–2.4)

## 2022-02-09 MED ORDER — POTASSIUM CHLORIDE CRYS ER 20 MEQ PO TBCR
40.0000 meq | EXTENDED_RELEASE_TABLET | ORAL | Status: AC
  Administered 2022-02-09 (×2): 40 meq via ORAL
  Filled 2022-02-09 (×2): qty 2

## 2022-02-10 ENCOUNTER — Telehealth: Payer: Self-pay

## 2022-02-11 ENCOUNTER — Encounter: Payer: Self-pay | Admitting: Nurse Practitioner

## 2022-02-11 ENCOUNTER — Ambulatory Visit (INDEPENDENT_AMBULATORY_CARE_PROVIDER_SITE_OTHER): Payer: Medicare Other | Admitting: Nurse Practitioner

## 2022-02-11 VITALS — BP 132/68 | HR 88 | Temp 98.8°F | Ht 63.0 in | Wt 182.8 lb

## 2022-02-11 DIAGNOSIS — R7309 Other abnormal glucose: Secondary | ICD-10-CM | POA: Diagnosis not present

## 2022-02-11 DIAGNOSIS — E782 Mixed hyperlipidemia: Secondary | ICD-10-CM

## 2022-02-11 DIAGNOSIS — I639 Cerebral infarction, unspecified: Secondary | ICD-10-CM

## 2022-02-11 DIAGNOSIS — I119 Hypertensive heart disease without heart failure: Secondary | ICD-10-CM

## 2022-02-11 DIAGNOSIS — Z6832 Body mass index (BMI) 32.0-32.9, adult: Secondary | ICD-10-CM

## 2022-02-11 DIAGNOSIS — E6609 Other obesity due to excess calories: Secondary | ICD-10-CM

## 2022-02-11 DIAGNOSIS — R4701 Aphasia: Secondary | ICD-10-CM

## 2022-02-11 NOTE — Progress Notes (Signed)
I,Victoria T Hamilton,acting as a Education administrator for [name], FNP.,have documented all relevant documentation on the behalf of [name], FNP,as directed by  [name], FNP while in the presence of [name], [place].   This visit occurred during the SARS-CoV-2 public health emergency.  Safety protocols were in place, including screening questions prior to the visit, additional usage of staff PPE, and extensive cleaning of exam room while observing appropriate contact time as indicated for disinfecting solutions.  Subjective:     Patient ID: Sarah Gilbert , female    DOB: Feb 02, 1946 , 76 y.o.   MRN: [id number]   Chief Complaint  Patient presents with   Hospitalization Follow-up    HPI  Pt presents today for HPFU.  Admitted on 02/07/2022, discharged on 02/09/2022.  Her uncle is with her today, he reports before being admitted she was incoherent. He was helping administering her medications, before she did take them he noticed her speech was slurred. After that he did notice something was not right, so he called the paramedics & they told him she should go to the hospital.  Tests were ran, they did not find anything abnormal. Patient reports she does not remember anything from that day.     She would also like to know if their is a facility she can go to, to complete colonoscopy without having to be put to sleep.  She does not feel comfortable doing the cologuard "I don't trust myself to do it".   She scheduled an appt with Neurology on July 26th. She had missed her Saturday morning dose before this episode.      Past Medical History:  Diagnosis Date   Anemia    Arthritis    Atrial fibrillation (HCC)    Bleeding nose    history of    ESRD (end stage renal disease) (HCC)    History of bronchitis    Hyperlipemia    Hypertension    Joint inflammation    Obesity    PONV (postoperative nausea and vomiting)    Rotator cuff tear arthropathy of right shoulder 06/18/2015    Thyroid disease    Wears glasses      Family History  Problem Relation Age of Onset   Dementia Mother    Cancer Father    Breast cancer Neg Hx      Current Outpatient Medications:    amLODipine (NORVASC) 10 MG tablet, Take 5 mg by mouth daily. , Disp: , Rfl:    apixaban (ELIQUIS) 5 MG TABS tablet, Take 1 tablet (5 mg total) by mouth 2 (two) times daily., Disp: 60 tablet, Rfl: 2   atorvastatin (LIPITOR) 40 MG tablet, Take 1 tablet (40 mg total) by mouth daily., Disp: 30 tablet, Rfl: 2   furosemide (LASIX) 40 MG tablet, Take 40 mg by mouth 2 (two) times daily., Disp: , Rfl:    MAGNESIUM PO, Take 1 tablet by mouth in the morning and at bedtime., Disp: , Rfl:    metoprolol (LOPRESSOR) 50 MG tablet, Take 50 mg by mouth 2 (two) times daily., Disp: , Rfl:    multivitamin (RENA-VIT) TABS tablet, Take 1 tablet by mouth daily., Disp: , Rfl:    mycophenolate (CELLCEPT) 250 MG capsule, Take 250 mg by mouth 2 (two) times daily., Disp: , Rfl:    nystatin cream (MYCOSTATIN), Apply 1 application topically 2 (two) times daily. (Patient taking differently: Apply 1 Application topically 2 (two) times daily as needed (rash). When able to remember), Disp:  30 g, Rfl: 2   pantoprazole (PROTONIX) 40 MG tablet, Take 40 mg by mouth daily. , Disp: , Rfl:    potassium chloride SA (K-DUR,KLOR-CON) 20 MEQ tablet, Take 20 mEq by mouth 2 (two) times daily., Disp: , Rfl:    tacrolimus (PROGRAF) 0.5 MG capsule, Take 0.5 mg by mouth every evening., Disp: , Rfl:    tacrolimus (PROGRAF) 1 MG capsule, Take 2 mg by mouth 2 (two) times daily., Disp: , Rfl:    Vitamin D, Ergocalciferol, (DRISDOL) 1.25 MG (50000 UNIT) CAPS capsule, TAKE 1 CAPSULE BY MOUTH 2 TIMES A WEEK (Patient taking differently: Take 50,000 Units by mouth See admin instructions. Take 50,000 units by mouth twice a week on Thursday and Sunday), Disp: 12 capsule, Rfl: 1   Allergies  Allergen Reactions   Vancomycin Hives, Itching, Rash and Other (See Comments)     Blistering, swelling, and peeling of feet.  Pt denies any similar reactions around face or neck.   Iohexol      Desc: iodine contrast/per dialysis center, Onset Date: [id number]      Review of Systems  Constitutional: Negative.   Respiratory: Negative.    Cardiovascular: Negative.   Neurological: Negative.   Psychiatric/Behavioral: Negative.       Today's Vitals   02/11/22 1143  BP: 132/68  Pulse: 88  Temp: 98.8 F (37.1 C)  SpO2: 97%  Weight: 182 lb 12.8 oz (82.9 kg)  Height: _0  (1.6 m)  PainSc: 0-No pain   Body mass index is 32.38 kg/m.  Wt Readings from Last 3 Encounters:  03/11/22 199 lb 8 oz (90.5 kg)  02/11/22 182 lb 12.8 oz (82.9 kg)  02/08/22 194 lb 10.7 oz (88.3 kg)    Objective:  Physical Exam Vitals reviewed.  Constitutional:      General: She is not in acute distress.    Appearance: Normal appearance.  Cardiovascular:     Rate and Rhythm: Normal rate and regular rhythm.     Pulses: Normal pulses.     Heart sounds: Normal heart sounds. No murmur heard. Pulmonary:     Effort: Pulmonary effort is normal.  Neurological:     General: No focal deficit present.     Mental Status: She is alert and oriented to person, place, and time.     Motor: Weakness (left arm strength 4/5) present.  Psychiatric:        Mood and Affect: Mood normal.        Behavior: Behavior normal.        Thought Content: Thought content normal.        Judgment: Judgment normal.         Assessment And Plan:     1. Acute ischemic stroke ([place]) - R MCA embolic from AF off AC s/p thrombectomy TCM Performed. A member of the clinical team spoke with the patient upon dischare. Discharge summary was reviewed in full detail during the visit. Meds reconciled and compared to discharge meds. Medication list is updated and reviewed with the patient.  Greater than 50% face to face time was spent in counseling an coordination of care.  All questions were answered to the satisfaction of the  patient.   She has an appt with Neurology next month  2. Benign hypertensive heart disease without heart failure Comments: Blood pressure is fairly controlled. Continue current medication  3. Abnormal glucose Comments: Stable, continue with healthy diet and regular exercise as tolerated.   4. Mixed hyperlipidemia Comments: Stable, continue  statin. Tolerating well.   5. Class 1 obesity due to excess calories with body mass index (BMI) of 32.0 to 32.9 in adult, unspecified whether serious comorbidity present Chronic Discussed healthy diet and regular exercise options  Encouraged to exercise at least 150 minutes per week with 2 days of strength training. She is encouraged to strive for BMI less than 30 to decrease cardiac risk. A  6. Aphasia Comments: She speaks well but often has difficulty remembering information, she has brought her uncle with her today.  - Ambulatory referral to [facility]; Future  7. Hypomagnesemia - Magnesium     Patient was given opportunity to ask questions. Patient verbalized understanding of the plan and was able to repeat key elements of the plan. All questions were answered to their satisfaction.  [name], FNP   I, [name], FNP, have reviewed all documentation for this visit. The documentation on 02/11/22 for the exam, diagnosis, procedures, and orders are all accurate and complete.   IF YOU HAVE BEEN REFERRED TO A SPECIALIST, IT MAY TAKE 1-2 WEEKS TO SCHEDULE/PROCESS THE REFERRAL. IF YOU HAVE NOT HEARD FROM US/SPECIALIST IN TWO WEEKS, PLEASE GIVE [name] A CALL AT [phone].   THE PATIENT IS ENCOURAGED TO PRACTICE SOCIAL DISTANCING DUE TO THE COVID-19 PANDEMIC.

## 2022-02-12 LAB — CBC
Hematocrit: 40.2 % (ref 34.0–46.6)
Hemoglobin: 13.2 g/dL (ref 11.1–15.9)
MCH: 29.9 pg (ref 26.6–33.0)
MCHC: 32.8 g/dL (ref 31.5–35.7)
MCV: 91 fL (ref 79–97)
Platelets: 265 10*3/uL (ref 150–450)
RBC: 4.41 x10E6/uL (ref 3.77–5.28)
RDW: 13.1 % (ref 11.7–15.4)
WBC: 6.2 10*3/uL (ref 3.4–10.8)

## 2022-02-12 LAB — BMP8+EGFR
BUN/Creatinine Ratio: 15 (ref 12–28)
BUN: 13 mg/dL (ref 8–27)
CO2: 23 mmol/L (ref 20–29)
Calcium: 10.1 mg/dL (ref 8.7–10.3)
Chloride: 105 mmol/L (ref 96–106)
Creatinine, Ser: 0.85 mg/dL (ref 0.57–1.00)
Glucose: 94 mg/dL (ref 70–99)
Potassium: 3.6 mmol/L (ref 3.5–5.2)
Sodium: 143 mmol/L (ref 134–144)
eGFR: 72 mL/min/{1.73_m2} (ref >59)

## 2022-02-12 LAB — MAGNESIUM: Magnesium: 1.5 mg/dL — ABNORMAL LOW (ref 1.6–2.3)

## 2022-02-23 ENCOUNTER — Encounter: Payer: Self-pay | Admitting: Nurse Practitioner

## 2022-03-10 NOTE — Progress Notes (Unsigned)
[facility] [address] [place] [address] [phone]       STROKE FOLLOW UP NOTE  [name] Date of Birth:  04-08-1946 Medical Record Number:  [id number]   Reason for Referral: stroke follow up    SUBJECTIVE:   CHIEF COMPLAINT:  No chief complaint on file.    HPI:   Update 03/10/2022 JM: Patient is being seen today for hospital follow-up accompanied by her uncle.  She presented to [name] ED on 6/24 with 2 episodes of aphasia lasting 15 to 20 minutes which resolved without intervention.  MRI negative for acute stroke.  Etiology of symptoms likely TIA in setting of A-fib on Eliquis. DDx including seizure as she was unable to recall event, denies loss of consciousness.  MRI and carotid Dopplers unremarkable.  EF 65 to 70%.  EEG normal.  LDL 59 on atorvastatin 40 mg daily.  A1c 5.7. per note review, patient reported she may have missed some doses of Eliquis but wished to continue Eliquis with supervision from family.  She returned back to baseline and was discharged home with family.  Since discharge, she has been doing well without any reoccurring episodes.  Reports continued mild cognitive difficulties with short-term memory impairment which has been stable since prior visit.  She has since ensured compliance with Eliquis and atorvastatin, denies side effects, family assist with management Blood pressure today ***   HOSPITALIZATION PERTINENT IMAGING/LABS Code Stroke CT head No acute abnormality. ASPECTS 10.    MRI  No acute infarct MRA  Normal intracranial MRA Carotid Doppler  minimal thickening or plaque bilaterally 2D Echo EF 65-70%, severely dilated left atrium, moderate tricuspid regurgitation, no atrial level shunt EEG no seizure. LDL 59 HgbA1c 5.7   History provided for reference purposes only Update 06/18/2021 JM: Returns for 23-monthstroke follow-up accompanied by her uncle, Sarah Gilbert Overall stable from stroke standpoint  since prior visit without new stroke/TIA symptoms Continued short-term memory difficulties - stable.  Continue with ADLs and majority of ADLs independently.  She continues to live with her uncle. She is very frustrated regarding her continued short-term memory difficulties. She is frustrated that she cannot remember things like she used to such as where she set something down, things people recently said to her, etc. Long term intact. She denies depression/anxiety but during visit, likely underlying anxiety contributing. She does admit to not doing ANY memory exercises (which has been discussed at prior few visits). She tries to keep active (involved in church, cares for her elderly aunt) but no routine exercise.  Reports she sleeps well at night.  Appetite good.  Compliant on Eliquis and atorvastatin -denies side effects Blood pressure today 128/62 -routinely monitors at home and typically stable Routinely followed by PCP JMinette Brine FNP   CPAP not yet started - per uncle, he spoke to DME since prior visit but they did not have any machines available - was told they would call him once one available but has not heard anything yet.   No new concerns at this time  Update 12/16/2020 JM: Ms. DMorandreturns for 637-monthtroke follow-up accompanied by her uncle, ThPara Marchrom stroke standpoint without new stroke/TIA symptoms Reports continued short-term memory without worsening but denies improvement.  She does not do any memory exercises nor routine physical activity or exercise.  She is able to maintain ADLs independently and majority of IADLs.  She continues to live with her uncle who assists as needed Compliant on Eliquis  and atorvastatin without associated side effects Blood pressure today 137/82 - monitors at home and typically lower 110-120/80s   She wished to initiate CPAP at prior follow-up visit which was initiated by Dr. Tori Milks nurse Cyril Mourning Dinkins, RN. Per epic review, [facility]  attempted to reach him multiple times in February but per pt and uncle, they did not receive any calls to start.   No further concerns at this time   Update 06/17/2020 JM: Sarah Gilbert returns for stroke follow-up accompanied by her uncle.  C/o short term memory difficulty since her stroke such as misplacing items as well as difficulty with comprehension or remembering what is being told her.  This has been stable without worsening.  She is able to maintain her ADLs and majority of IADLs independently. Hx of mild short-term memory impairment prior to her stroke.  She was evaluated by speech therapy at home and discharged as SLP not indicated per patient.  Denies new or worsening stroke/TIA symptoms.  She remains on Eliquis and atorvastatin for secondary stroke prevention without side effects.  Blood pressure today 136/72.  Evaluated by Dr. Rexene Alberts for possible sleep apnea undergoing baseline sleep study on 03/31/2020 which did show evidence of mild sleep apnea and recommended initiating AutoPap therapy.  She was should further speak with her cardiologist and nephrologist prior to initiating who recommended treatment for sleep apnea and is now ready to proceed with AutoPap therapy.  No further concerns at this time.  Initial visit 02/13/2020 JM: Sarah Gilbert is being seen for hospital follow-up accompanied by her uncle.  She has been doing well since discharge without residual stroke deficits.  Participated in [facility] PT/OT which has since been completed.  She does report increased fatigue since her stroke.  Sleeps well at night.  Sleeps alone but denies snoring or witnessed apneas.  She has not previously underwent sleep study to rule out sleep apnea.  Continues on Eliquis 5 mg twice daily without bleeding or bruising.  Continues to follow with cardiologist Dr. Terrence Dupont routinely.  Continues on atorvastatin 40 mg daily without myalgias.  Blood pressure today 143/80.  No concerns at this time.  Stroke admission  01/07/2020 Sarah Gilbert is a 76 y.o. female with history of double kidney transplant (2011), HTN, . A fib (on xarelto), HLD who presented on 01/07/2020 with L isded weakness, R gaze, L facial and slurred speech.  Stroke work-up revealed several small right MCA infarcts w/ right ICA terminus thrombus s/p TICI 3 revascularization, infarcts embolic secondary to known AF off AC.  MRA head/neck unremarkable post IR.  Previously on Xarelto but per report, missed 3 to 4 days due to running out of prescription and transition to Eliquis 5 mg twice daily for secondary stroke prevention and history of atrial fibrillation.  History of HTN and resumed home amlodipine, Lasix and metoprolol.  History of HLD with LDL 74 and increased home dose atorvastatin from 20 mg to 40 mg daily.  Other stroke risk factors include advanced age and obesity but no prior stroke history.  Other active problems include ESRD s/p bilateral kidney transplant on Prograf and CellCept (not on HD), thyroid disease and hypokalemia.  Evaluated by therapies and discharged home with home health PT/OT.   Stroke:   Severeal small R MCA infarcts embolic secondary to known AF off AC Code Stroke CT head hyperdense R supraclinoid ICA. ASPECTS 10.    Cerebral angio R ICA terminus thrombus s/p TICI3 revascularization w/ 2 passes Earleen Newport) Post IR  CT no hemorrhage, no statin  MRI  Several small scattered R hemisphere infarcts MRA head mild intracranial atherosclerosis  MRA neck Unremarkable  2D Echo EF 60-65%. No source of embolus. LA dilated LDL 74 -increase atorvastatin from 20 mg to 40 mg daily HgbA1c 6.1 Xarelto (rivaroxaban) daily prior to admission but had missed 3-4 days, now on Eliquis 5 mg bid.  Therapy recommendations:  HH PT, [place] [facility] OT - [facility] Disposition:  return home      ROS:   14 system review of systems performed and negative with exception of short-term memory loss  PMH:  Past Medical History:  Diagnosis Date   Anemia     Arthritis    Atrial fibrillation ([place])    Bleeding nose    history of    ESRD (end stage renal disease) (HCC)    History of bronchitis    Hyperlipemia    Hypertension    Joint inflammation    Obesity    PONV (postoperative nausea and vomiting)    Rotator cuff tear arthropathy of right shoulder 06/18/2015   Thyroid disease    Wears glasses     PSH:  Past Surgical History:  Procedure Laterality Date   ABDOMINAL HYSTERECTOMY     APPENDECTOMY     CARDIAC CATHETERIZATION N/A 04/18/2015   Procedure: Left Heart Cath and Coronary Angiography;  Surgeon: Charolette Forward, MD;  Location: [facility];  Service: Cardiovascular;  Laterality: N/A;   CHOLECYSTECTOMY     COLONOSCOPY     IR CT HEAD LTD  01/07/2020   IR PERCUTANEOUS ART THROMBECTOMY/INFUSION INTRACRANIAL INC DIAG ANGIO  01/07/2020   IR US GUIDE VASC ACCESS RIGHT  01/07/2020   KIDNEY TRANSPLANT     june 2011-[facility]   RADIOLOGY WITH ANESTHESIA N/A 01/07/2020   Procedure: IR WITH ANESTHESIA;  Surgeon: Radiologist, Medication, MD;  Location: [facility];  Service: Radiology;  Laterality: N/A;   TONSILLECTOMY     TOTAL SHOULDER ARTHROPLASTY Right 06/18/2015   Procedure: RIGHT TOTAL SHOULDER ARTHROPLASTY;  Surgeon: Marchia Bond, MD;  Location: [facility];  Service: Orthopedics;  Laterality: Right;    Social History:  Social History   Socioeconomic History   Marital status: Single    Spouse name: Not on file   Number of children: Not on file   Years of education: Not on file   Highest education level: Not on file  Occupational History   Occupation: retired  Tobacco Use   Smoking status: Never   Smokeless tobacco: Never  Vaping Use   Vaping Use: Never used  Substance and Sexual Activity   Alcohol use: No   Drug use: No   Sexual activity: Not Currently  Other Topics Concern   Not on file  Social History Narrative   Not on file   Social Determinants of Health   Financial Resource Strain: Low Risk  (12/18/2021)   Overall  Financial Resource Strain (CARDIA)    Difficulty of Paying Living Expenses: Not hard at all  Food Insecurity: No Food Insecurity (12/18/2021)   Hunger Vital Sign    Worried About Running Out of Food in the Last Year: Never true    [place] in the Last Year: Never true  Transportation Needs: No Transportation Needs (12/18/2021)   PRAPARE - Hydrologist (Medical): No    Lack of Transportation (Non-Medical): No  Physical Activity: Inactive (12/18/2021)   Exercise Vital Sign    Days of Exercise per Week: 0  days    Minutes of Exercise per Session: 0 min  Stress: No Stress Concern Present (12/18/2021)   [place]    Feeling of Stress : Not at all  Social Connections: Not on file  Intimate Partner Violence: Not At Risk (09/29/2018)   Humiliation, Afraid, Rape, and Kick questionnaire    Fear of Current or Ex-Partner: No    Emotionally Abused: No    Physically Abused: No    Sexually Abused: No    Family History:  Family History  Problem Relation Age of Onset   Dementia Mother    Cancer Father    Breast cancer Neg Hx     Medications:   Current Outpatient Medications on File Prior to Visit  Medication Sig Dispense Refill   amLODipine (NORVASC) 10 MG tablet Take 5 mg by mouth daily.      apixaban (ELIQUIS) 5 MG TABS tablet Take 1 tablet (5 mg total) by mouth 2 (two) times daily. 60 tablet 2   atorvastatin (LIPITOR) 40 MG tablet Take 1 tablet (40 mg total) by mouth daily. 30 tablet 2   furosemide (LASIX) 40 MG tablet Take 40 mg by mouth 2 (two) times daily.     MAGNESIUM PO Take 1 tablet by mouth in the morning and at bedtime.     metoprolol (LOPRESSOR) 50 MG tablet Take 50 mg by mouth 2 (two) times daily.     multivitamin (RENA-VIT) TABS tablet Take 1 tablet by mouth daily.     mycophenolate (CELLCEPT) 250 MG capsule Take 250 mg by mouth 2 (two) times daily.     nystatin cream (MYCOSTATIN)  Apply 1 application topically 2 (two) times daily. (Patient taking differently: Apply 1 Application topically 2 (two) times daily as needed (rash). When able to remember) 30 g 2   pantoprazole (PROTONIX) 40 MG tablet Take 40 mg by mouth daily.      potassium chloride SA (K-DUR,KLOR-CON) 20 MEQ tablet Take 20 mEq by mouth 2 (two) times daily.     tacrolimus (PROGRAF) 0.5 MG capsule Take 0.5 mg by mouth every evening.     tacrolimus (PROGRAF) 1 MG capsule Take 2 mg by mouth 2 (two) times daily.     Vitamin D, Ergocalciferol, (DRISDOL) 1.25 MG (50000 UNIT) CAPS capsule TAKE 1 CAPSULE BY MOUTH 2 TIMES A WEEK (Patient taking differently: Take 50,000 Units by mouth See admin instructions. Take 50,000 units by mouth twice a week on Thursday and Sunday) 12 capsule 1   No current facility-administered medications on file prior to visit.    Allergies:   Allergies  Allergen Reactions   Vancomycin Hives, Itching, Rash and Other (See Comments)    Blistering, swelling, and peeling of feet.  Pt denies any similar reactions around face or neck.   Iohexol      Desc: iodine contrast/per dialysis center, Onset Date: [id number]       OBJECTIVE:  Physical Exam  There were no vitals filed for this visit.   There is no height or weight on file to calculate BMI. No results found.  General: well developed, well nourished, pleasant elderly African-American female, seated, in no evident distress Head: head normocephalic and atraumatic.   Neck: supple with no carotid or supraclavicular bruits Cardiovascular: irregular rate and rhythm, no murmurs Musculoskeletal: no deformity Skin:  no rash/petichiae Vascular:  Normal pulses all extremities   Neurologic Exam Mental Status: Awake and fully alert.  Fluent speech and language. Oriented  to place and time. Recent memory subjectively impaired and remote memory intact. Attention span and concentration impaired with repeating questions and needing repeat  clarification and fund of knowledge appropriate. Mood and affect appropriate. Recall 2/3 (prior 1/3). Serial addition good. 4 legged animal 10 (prior 9) in 60 seconds. Clock drawing 4/4 Cranial Nerves: Pupils equal, briskly reactive to light. Extraocular movements full without nystagmus. Visual fields full to confrontation. Hearing intact. Facial sensation intact. Face, tongue, palate moves normally and symmetrically.  Motor: Normal bulk and tone. Normal strength in all tested extremity muscles. Sensory.: intact to touch , pinprick , position and vibratory sensation.  Coordination: Rapid alternating movements normal in all extremities except slightly decreased left hand. Finger-to-nose and heel-to-shin performed accurately bilaterally. Mild action tremor right hand (chronic) Gait and Station: Arises from chair without difficulty. Stance is normal. Gait demonstrates normal stride length and balance with use of cane Reflexes: 1+ and symmetric. Toes downgoing.        ASSESSMENT/PLAN: Sarah Gilbert is a 76 y.o. year old female with history of several small right MCA infarcts in setting of right ICA terminus thrombus s/p TICI 3 revascularization on 01/07/2020 secondary to known AF missing dosages of Xarelto.  Transitioned to Eliquis at that time.  2 episodes of aphasia 02/07/2022 likely in setting of TIA with missing doses of Eliquis although unable to completely rule out seizure activity. Vascular risk factors include HTN, HLD, A. Fib, obesity and recent diagnosis of mild OSA.      Likely TIA Right MCA infarcts:  Residual deficits: Short-term memory complaints with baseline memory impairment prior to stroke likely mild cognitive impairment. Referral placed to SLP for cognitive therapy although suspect underlying anxiety largely contributing. Advised to f/u with PCP for further discussion.  Discussed compensation strategies as well as importance of memory exercises such as crossword puzzles, word  search, sudoku, card games and reading as well as managing stroke risk factors, routine physical activity, healthy diet and adequate sleep. Also hx of sleep apnea but not yet started CPAP - will further look into this Continue  Eliquis 5 mg twice daily   and atorvastatin 40 mg daily for secondary stroke prevention.  Discussed importance of compliance with twice daily dosing regimen and importance of family closely monitoring Discussed secondary stroke prevention and importance of close PCP follow-up for management of stroke risk factors for secondary stroke prevention including HTN with BP goal<130/90 and HLD with LDL goal<40 Stroke labs 01/2022: LDL 59, A1c 5.7   Obstructive sleep apnea Baseline sleep study 03/31/2020 by Dr. Rexene Alberts which showed mild OSA Will look into why CPAP not yet started - previously machines on back order  Discussed importance of CPAP in relation to prior stroke, atrial fibrillation and memory complaints     Follow-up will be determined once CPAP initiated   CC:  [name], FNP    I spent 39 minutes of face-to-face and non-face-to-face time with patient and uncle.  This included previsit chart review, lab review, study review, order entry, electronic health record documentation, patient and uncle education and discussion regarding prior stroke with residual deficits, short-term memory concerns and possible contributing factors, secondary stroke prevention measures and aggressive stroke risk factor management and answered all other questions to patient's satisfaction  Frann Rider, [facility]  [facility] [address] [place], [place] [address]  Phone [phone] Fax [phone] Note: This document was prepared with digital dictation and possible smart phrase technology. Any transcriptional errors that result from this  process are unintentional.

## 2022-03-11 ENCOUNTER — Encounter: Payer: Self-pay | Admitting: Adult Health

## 2022-03-11 ENCOUNTER — Ambulatory Visit: Payer: Medicare Other | Admitting: Adult Health

## 2022-03-11 ENCOUNTER — Telehealth: Payer: Self-pay | Admitting: *Deleted

## 2022-03-11 VITALS — BP 148/94 | HR 64 | Ht 65.0 in | Wt 199.5 lb

## 2022-03-11 DIAGNOSIS — G3184 Mild cognitive impairment, so stated: Secondary | ICD-10-CM | POA: Diagnosis not present

## 2022-03-11 DIAGNOSIS — Z8673 Personal history of transient ischemic attack (TIA), and cerebral infarction without residual deficits: Secondary | ICD-10-CM

## 2022-03-11 DIAGNOSIS — G459 Transient cerebral ischemic attack, unspecified: Secondary | ICD-10-CM

## 2022-03-11 NOTE — Telephone Encounter (Signed)
-----   Message from Frann Rider, NP sent at 03/11/2022  9:57 AM EDT ----- Can you please look in to continued issues obtaining CPAP machine? She had sleep study completed almost 2 years ago, unsure if f/u visit with Dr. Rexene Alberts will be required at this point. Thank you!

## 2022-03-11 NOTE — Telephone Encounter (Signed)
[facility] had called pt 4x early last year to discuss CPAP. Pt never responded. She will need an appt with Dr Rexene Alberts at this point. I called the pt at main line on chart, rang multiple times and pt never answered. Called secondary mobile number on chart and LVM asking for call back.

## 2022-03-11 NOTE — Progress Notes (Signed)
Yes I did see that. I placed a new DME order back in Nov 2022, uncle initially said he did not receive a telephone call since prior visit but then later during visit said they called in February of this year and said they were still on backorder but question accuracy of this. I have tried to encourage the uncle to contact [facility] previously but has not yet done so. Confirmed correct contact information in chart. Hopefully he will call back and get back in to see Dr. Rexene Alberts. Thank you!

## 2022-03-11 NOTE — Patient Instructions (Signed)
Continue  Eliquis 5 mg twice daily   and atorvastatin for secondary stroke prevention  Very important to ensure you do not miss any doses of Eliquis as even missing 1 dose increases your risk of strokes  We will look in to continued issues obtaining a CPAP machine for management of your sleep apnea - if you do not hear from anyone by the end of next week, please call and let us know so we can follow up on this  Continue to follow up with PCP regarding cholesterol and blood pressure management  Maintain strict control of hypertension with blood pressure goal below 130/90 and cholesterol with LDL cholesterol (bad cholesterol) goal below 70 mg/dL.   Signs of a Stroke? Follow the BEFAST method:  Balance Watch for a sudden loss of balance, trouble with coordination or vertigo Eyes Is there a sudden loss of vision in one or both eyes? Or double vision?  Face: Ask the person to smile. Does one side of the face droop or is it numb?  Arms: Ask the person to raise both arms. Does one arm drift downward? Is there weakness or numbness of a leg? Speech: Ask the person to repeat a simple phrase. Does the speech sound slurred/strange? Is the person confused ? Time: If you observe any of these signs, call 911.     Followup in the future with me in 6 months or call earlier if needed       Thank you for coming to see [name] at [facility]. I hope we have been able to provide you high quality care today.  You may receive a patient satisfaction survey over the next few weeks. We would appreciate your feedback and comments so that we may continue to improve ourselves and the health of our patients.

## 2022-03-12 NOTE — Telephone Encounter (Signed)
I called pt.. I made her appt for sleep consult for starting process all over again. Since it has been years since we saw her.  She did not get cpap. She will need another sleep test and then proceed from there.  Pt verbalized understanding of appt 03-30-2022 at 1515 (arrive 1500).

## 2022-03-12 NOTE — Progress Notes (Signed)
She has not ever been on CPAP therapy as she has yet to receive a CPAP machine.  She is very interested in starting CPAP.  Please let me know where my note says that she is on CPAP but has trouble so I can correct this (I was unable to find where this was noted). Thank you!

## 2022-03-13 ENCOUNTER — Other Ambulatory Visit: Payer: Self-pay

## 2022-03-13 DIAGNOSIS — E559 Vitamin D deficiency, unspecified: Secondary | ICD-10-CM

## 2022-03-13 MED ORDER — VITAMIN D (ERGOCALCIFEROL) 1.25 MG (50000 UNIT) PO CAPS: 50000.0000 [IU] | ORAL_CAPSULE | ORAL | 1 refills | Status: DC

## 2022-03-30 ENCOUNTER — Telehealth: Payer: Self-pay | Admitting: Neurology

## 2022-03-30 ENCOUNTER — Institutional Professional Consult (permissible substitution): Payer: Medicare Other | Admitting: Neurology

## 2022-03-30 NOTE — Telephone Encounter (Signed)
Called to inform pt of need to reschedule 8/14 appointment due to MD being out. Pt will call back to reschedule

## 2022-04-16 ENCOUNTER — Other Ambulatory Visit (INDEPENDENT_AMBULATORY_CARE_PROVIDER_SITE_OTHER): Payer: Medicare Other | Admitting: Nurse Practitioner

## 2022-04-16 DIAGNOSIS — I482 Chronic atrial fibrillation, unspecified: Secondary | ICD-10-CM

## 2022-04-16 DIAGNOSIS — E876 Hypokalemia: Secondary | ICD-10-CM | POA: Diagnosis not present

## 2022-04-16 DIAGNOSIS — Z940 Kidney transplant status: Secondary | ICD-10-CM

## 2022-04-16 DIAGNOSIS — I119 Hypertensive heart disease without heart failure: Secondary | ICD-10-CM | POA: Diagnosis not present

## 2022-04-16 DIAGNOSIS — R4701 Aphasia: Secondary | ICD-10-CM

## 2022-04-16 DIAGNOSIS — Z8673 Personal history of transient ischemic attack (TIA), and cerebral infarction without residual deficits: Secondary | ICD-10-CM

## 2022-04-16 NOTE — Progress Notes (Signed)
Chief Complaint  Patient presents with   Initial Home Health Certification   Received home health orders orders from [facility]. Start of care 02/25/2022.   Certification and orders from 02/25/2022 through 04/25/2022 are reviewed, signed and faxed back to home health company.  Need of intermittent skilled services at home: PT and OT  The home health care plan has been established by me and will be reviewed and updated as needed to maximize patient recovery.  I certify that all home health services have been and will be furnished to the patient while under my care.  Face-to-face encounter in which the need for home health services was established: 02/11/2022  Patient is receiving home health services for the following diagnoses: Problem List Items Addressed This Visit       Cardiovascular and Mediastinum   A-fib ([place])     Other   Hypokalemia   Other Visit Diagnoses     Aphasia    -  Primary   Benign hypertensive heart disease without heart failure       Personal history of transient cerebral ischemia       S/P kidney transplant       Hypomagnesemia            [name], FNP

## 2022-04-21 ENCOUNTER — Encounter: Payer: Self-pay | Admitting: Neurology

## 2022-04-21 ENCOUNTER — Telehealth: Payer: Self-pay

## 2022-04-21 ENCOUNTER — Ambulatory Visit (INDEPENDENT_AMBULATORY_CARE_PROVIDER_SITE_OTHER): Payer: Medicare Other | Admitting: Neurology

## 2022-04-21 VITALS — BP 133/61 | HR 77 | Ht 65.0 in | Wt 186.0 lb

## 2022-04-21 DIAGNOSIS — G4733 Obstructive sleep apnea (adult) (pediatric): Secondary | ICD-10-CM | POA: Diagnosis not present

## 2022-04-21 NOTE — Patient Instructions (Signed)
As discussed, I will hold off on a prescription for an AutoPap machine.  As per your request, I will ask Janett Billow to call you.

## 2022-04-21 NOTE — Telephone Encounter (Signed)
Received CC Chart Frann Rider, NP  P [facility] Calls Please contact patient as requested. Was just seen by Dr. Rexene Alberts today, not sure what she has questions about? Thank you.        Previous Messages    ----- Message -----  From: Star Age, MD  Sent: 04/21/2022  11:37 AM EDT  To: Garvin Fila, MD; Frann Rider, NP   Please see my note for today.  Patient declines AutoPap therapy.  She would like for you to call her.I have explained the mild obstructive sleep apnea to the patient and her uncle and the possibility of using AutoPap therapy.  She declined a prescription today again.  Michel Bickers

## 2022-04-21 NOTE — Telephone Encounter (Signed)
At recent visit with myself, she wished to pursue treatment for sleep apnea hence the reason she ended up having a follow up visit with Dr. Rexene Alberts. If she declines treatment, that is her decision and as [place] stated, there is no reason for a follow up visit to discuss this with me as she just discussed this with Dr. Rexene Alberts today. Thank you for for reaching out and advising her of this.

## 2022-04-21 NOTE — Progress Notes (Signed)
Subjective:    Patient ID: Sarah Gilbert is a 76 y.o. female.  HPI    Interim history:   Sarah Gilbert is a 76 year old right-handed woman with an underlying complex medical history of embolic stroke in May 7425, chronic A. fib, end-stage renal disease with status post kidney transplant in 2011, arthritis, hypertension, hyperlipidemia, anemia, thyroid disease and obesity, who presents for follow-up consultation of her sleep disorder.  The patient is accompanied by her uncle again today. I last saw her on 05/02/20, at which time we talked about her sleep study results and treatment with AutoPap.  She did not wish to proceed with AutoPap at the time.  Today, 04/21/2022: She reports not having any trouble sleeping, goes to bed around 730.  She does not feel that sleep is any problem.  We talked about treatment with AutoPap again today.  She would like to talk to Sarah Gilbert again and have her call her.  She is advised that from the sleep apnea standpoint, she has mild sleep apnea.  If she decides to proceed with AutoPap therapy, we can certainly use her sleep study from August 2021.  I would be happy to write for a new AutoPap prescription.   The patient's allergies, current medications, family history, past medical history, past social history, past surgical history and problem list were reviewed and updated as appropriate.    Previously:     I first met her on 03/14/2020 at the request of Frann Rider, nurse practitioner, at which time the patient reported daytime tiredness.  She had a recent history of embolic stroke in May 9563.  She was advised to proceed with sleep study testing.  She had a baseline sleep study on 03/31/2020 which showed evidence of mild obstructive sleep apnea but study was limited due to reduced sleep efficiency.  Sleep latency was 83 minutes, REM latency 123 minutes and sleep efficiency 55.2%.  She had moderate to severe sleep fragmentation, wake after sleep onset was 115.5  minutes.  She had a total AHI of 6.1/h, REM AHI was 12.5/h.  She did not achieve any supine sleep.  Average oxygen saturation was 98%, nadir was 87%. She did not have any significant PLM's.  EKG showed A. fib and PVCs.  She was advised to consider AutoPap therapy for sleep apnea management, given her test results and medical history.  She requested a follow-up appointment to discuss further.      03/14/20: (She) reports some daytime tiredness but no frank sleepiness.  She tries to go to bed early and is a early riser.  Her Epworth sleepiness score is 3 out of 24.  He is not sure if she snores.  She is single and has no children.  They have no pets at the house.  Bedtime is generally around 730 and rise time around 5:30 AM.  She is a non-smoker and does not utilize alcohol and drinks very little caffeine, not necessarily daily.  She has no known family history of sleep apnea.  She had a tonsillectomy as a child.  Her Past Medical History Is Significant For: Past Medical History:  Diagnosis Date   Anemia    Arthritis    Atrial fibrillation ([place])    Bleeding nose    history of    ESRD (end stage renal disease) ([place])    History of bronchitis    Hyperlipemia    Hypertension    Joint inflammation    Obesity    PONV (postoperative nausea and  vomiting)    Rotator cuff tear arthropathy of right shoulder 06/18/2015   Thyroid disease    Wears glasses     Her Past Surgical History Is Significant For: Past Surgical History:  Procedure Laterality Date   ABDOMINAL HYSTERECTOMY     APPENDECTOMY     CARDIAC CATHETERIZATION N/A 04/18/2015   Procedure: Left Heart Cath and Coronary Angiography;  Surgeon: Charolette Forward, MD;  Location: [facility];  Service: Cardiovascular;  Laterality: N/A;   CHOLECYSTECTOMY     COLONOSCOPY     IR CT HEAD LTD  01/07/2020   IR PERCUTANEOUS ART THROMBECTOMY/INFUSION INTRACRANIAL INC DIAG ANGIO  01/07/2020   IR US GUIDE VASC ACCESS RIGHT  01/07/2020   KIDNEY TRANSPLANT      june 2011-[facility]   RADIOLOGY WITH ANESTHESIA N/A 01/07/2020   Procedure: IR WITH ANESTHESIA;  Surgeon: Radiologist, Medication, MD;  Location: [facility];  Service: Radiology;  Laterality: N/A;   TONSILLECTOMY     TOTAL SHOULDER ARTHROPLASTY Right 06/18/2015   Procedure: RIGHT TOTAL SHOULDER ARTHROPLASTY;  Surgeon: Marchia Bond, MD;  Location: [facility];  Service: Orthopedics;  Laterality: Right;    Her Family History Is Significant For: Family History  Problem Relation Age of Onset   Dementia Mother    Cancer Father    Breast cancer Neg Hx    Sleep apnea Neg Hx     Her Social History Is Significant For: Social History   Socioeconomic History   Marital status: Single    Spouse name: Not on file   Number of children: Not on file   Years of education: Not on file   Highest education level: Not on file  Occupational History   Occupation: retired  Tobacco Use   Smoking status: Never   Smokeless tobacco: Never  Vaping Use   Vaping Use: Never used  Substance and Sexual Activity   Alcohol use: No   Drug use: No   Sexual activity: Not Currently  Other Topics Concern   Not on file  Social History Narrative   Not on file   Social Determinants of Health   Financial Resource Strain: Low Risk  (12/18/2021)   Overall Financial Resource Strain (CARDIA)    Difficulty of Paying Living Expenses: Not hard at all  Food Insecurity: No Food Insecurity (12/18/2021)   Hunger Vital Sign    Worried About Running Out of Food in the Last Year: Never true    [place] in the Last Year: Never true  Transportation Needs: No Transportation Needs (12/18/2021)   PRAPARE - Hydrologist (Medical): No    Lack of Transportation (Non-Medical): No  Physical Activity: Inactive (12/18/2021)   Exercise Vital Sign    Days of Exercise per Week: 0 days    Minutes of Exercise per Session: 0 min  Stress: No Stress Concern Present (12/18/2021)   [place]    Feeling of Stress : Not at all  Social Connections: Not on file    Her Allergies Are:  Allergies  Allergen Reactions   Vancomycin Hives, Itching, Rash and Other (See Comments)    Blistering, swelling, and peeling of feet.  Pt denies any similar reactions around face or neck.   Iohexol      Desc: iodine contrast/per dialysis center, Onset Date: [id number]   :   Her Current Medications Are:  Outpatient Encounter Medications as of 04/21/2022  Medication Sig  amLODipine (NORVASC) 10 MG tablet Take 5 mg by mouth daily.    apixaban (ELIQUIS) 5 MG TABS tablet Take 1 tablet (5 mg total) by mouth 2 (two) times daily.   atorvastatin (LIPITOR) 40 MG tablet Take 1 tablet (40 mg total) by mouth daily.   furosemide (LASIX) 40 MG tablet Take 40 mg by mouth 2 (two) times daily.   MAGNESIUM PO Take 1 tablet by mouth in the morning and at bedtime.   metoprolol (LOPRESSOR) 50 MG tablet Take 50 mg by mouth 2 (two) times daily.   mycophenolate (CELLCEPT) 250 MG capsule Take 250 mg by mouth 2 (two) times daily.   nystatin cream (MYCOSTATIN) Apply 1 application topically 2 (two) times daily. (Patient taking differently: Apply 1 Application topically 2 (two) times daily as needed (rash). When able to remember)   pantoprazole (PROTONIX) 40 MG tablet Take 40 mg by mouth daily.    potassium chloride SA (K-DUR,KLOR-CON) 20 MEQ tablet Take 20 mEq by mouth 2 (two) times daily.   tacrolimus (PROGRAF) 0.5 MG capsule Take 0.5 mg by mouth every evening.   tacrolimus (PROGRAF) 1 MG capsule Take 2 mg by mouth 2 (two) times daily.   Vitamin D, Ergocalciferol, (DRISDOL) 1.25 MG (50000 UNIT) CAPS capsule Take 1 capsule (50,000 Units total) by mouth See admin instructions. Take 50,000 units by mouth twice a week on Thursday and Sunday   multivitamin (RENA-VIT) TABS tablet Take 1 tablet by mouth daily. (Patient not taking: Reported on 04/21/2022)   No facility-administered encounter  medications on file as of 04/21/2022.  :  Review of Systems:  Out of a complete 14 point review of systems, all are reviewed and negative with the exception of these symptoms as listed below:   Review of Systems  Neurological:        Pt here for consult   Pt states hypertension ,hx stroke,memory loss ,fatigue .some headaches. Pt denies sleep study,CPAP machine ,snoring      ESS: FSS:    Objective:  Neurological Exam  Physical Exam Physical Examination:   Vitals:   04/21/22 1055  BP: 133/61  Pulse: 77    General Examination: The patient is a very pleasant 76 y.o. female in no acute distress. She appears well-developed and well-nourished and well groomed.   HEENT: Normocephalic, atraumatic, pupils are equal, round and reactive to light, extraocular tracking is good.  Corrective eyeglasses in place.  Hearing is grossly intact. Face is symmetric with normal facial animation. Speech is clear with no dysarthria noted. There is no hypophonia. There is no lip, neck/head, jaw or voice tremor.  Airway examination reveals stable findings, mild to moderate mouth dryness is noted.     Chest: Clear to auscultation without wheezing, rhonchi or crackles noted.   Heart: S1+S2+0, mildly irregular.  No murmurs noted.      Abdomen: Soft, non-tender and non-distended.   Extremities: There is trace to 1+ edema on the right and trace edema in the left leg.  She has AV fistulas.   Skin: Warm and dry without trophic changes noted.    Musculoskeletal: exam reveals no obvious joint deformities.     Neurologically:  Mental status: The patient is awake, alert, but asks some questions repeatedly.  For example, she asked how the CPAP would help her.  Speech is clear with normal prosody and enunciation. Thought process is linear. Mood is normal and affect is normal.  Cranial nerves II - XII are as described above under HEENT exam.  Motor exam: No obvious change noted.  Sensory exam: intact to light  touch in the upper and lower extremities.  Gait, station and balance: She stands with mild difficulty. Walks slowly.  Has a cane.   Assessment and Plan:    In summary, Sarah Gilbert is a very pleasant 76 year old female with an underlying complex medical history of embolic stroke in May 3267, chronic A. fib, end-stage renal disease with status post kidney transplant in 2011, arthritis, hypertension, hyperlipidemia, anemia, thyroid disease and obesity, who presents for follow-up consultation of her mild obstructive sleep apnea.  Baseline sleep study from 03/31/2020 showed an AHI of 6.1/h.  O2 nadir was 87%.  We had talked about her sleep study results in detail and her appointment in September 2021.  We went over the treatment options with AutoPap therapy again in detail.  She declines a prescription at this time, she reports that she sleeps well.  She would like to have Sarah Gilbert give her a call.  I told the patient and her uncle that I would be happy to relay the message and have Sarah Gilbert reach out.  Patient not ready to pursue AutoPap therapy.  We have had a discussion about this about 2 years ago as well.  If she changes her mind, I think we can proceed with an AutoPap prescription based on her sleep study from August 2021.  If her insurance requires a repeat sleep study, we will have to talk to her about scheduling another sleep study.  I answered all their questions today and the patient and her uncle were in agreement. I spent 20 minutes in total face-to-face time and in reviewing records during pre-charting, more than 50% of which was spent in counseling and coordination of care, reviewing test results, reviewing medications and treatment regimen and/or in discussing or reviewing the diagnosis of OSA, the prognosis and treatment options. Pertinent laboratory and imaging test results that were available during this visit with the patient were reviewed by me and considered in my medical decision making  (see chart for details).

## 2022-04-21 NOTE — Telephone Encounter (Signed)
Contacted pt back she stated she is not having a problem falling a sleep.she wanted me to go over the reasons as to why she needed to be on machine, she then rq an appointment with all 4 of [name] (herself, dr Rexene Alberts, her uncle and myself). I informed her I would not be in the appt I am not a MD and Dr Rexene Alberts would make the decisions. That was the reason for her appt with Dr Rexene Alberts today is to discus the need and for her to answer the questions she had about the machine.  It was no need for the call from [name], she stated she will think about it and call back

## 2022-06-05 ENCOUNTER — Encounter (HOSPITAL_COMMUNITY): Payer: Self-pay | Admitting: Emergency Medicine

## 2022-06-05 ENCOUNTER — Ambulatory Visit (HOSPITAL_COMMUNITY)
Admission: EM | Admit: 2022-06-05 | Discharge: 2022-06-05 | Disposition: A | Discharge status: Home / Self Care | Payer: Medicare Other | Attending: Emergency Medicine | Admitting: Emergency Medicine

## 2022-06-05 DIAGNOSIS — Z20822 Contact with and (suspected) exposure to covid-19: Secondary | ICD-10-CM | POA: Diagnosis present

## 2022-06-05 DIAGNOSIS — R051 Acute cough: Secondary | ICD-10-CM | POA: Insufficient documentation

## 2022-06-05 MED ORDER — MOLNUPIRAVIR 200 MG PO CAPS: 4.0000 | ORAL_CAPSULE | Freq: Two times a day (BID) | ORAL | 0 refills | Status: AC

## 2022-06-05 NOTE — ED Provider Notes (Addendum)
[facility]    CSN: [id number] Arrival date & time: 06/05/22  1143      History   Chief Complaint Chief Complaint  Patient presents with   Covid Exposure    HPI Sarah Gilbert is a 77 y.o. female.  Patient presents due to COVID exposure at home.  Patient complains of nonproductive cough and nasal congestion x 3 days.  Patient reports fatigue, but she reports that she normally has fatigue, and that this has not changed from her baseline.  Patient denies fever, shortness of breath, or chest pain.  Patient has not taken any medications for symptoms.  Patient reports a history of kidney transplant.  She has not been taking any medications for symptoms.   HPI  Past Medical History:  Diagnosis Date   Anemia    Arthritis    Atrial fibrillation ([place])    Bleeding nose    history of    ESRD (end stage renal disease) ([place])    History of bronchitis    Hyperlipemia    Hypertension    Joint inflammation    Obesity    PONV (postoperative nausea and vomiting)    Rotator cuff tear arthropathy of right shoulder 06/18/2015   Thyroid disease    Wears glasses     Patient Active Problem List   Diagnosis Date Noted   Expressive aphasia 02/07/2022   Renal transplant recipient 02/07/2022   Hypokalemia 02/07/2022   Acute ischemic stroke ([place]) - R MCA embolic from AF off AC s/p thrombectomy 01/07/2020   Abnormal glucose 12/29/2018   Mixed hyperlipidemia 09/29/2018   Lipoma of back 09/29/2018   Rotator cuff tear arthropathy of right shoulder 06/18/2015   S/p reverse total shoulder arthroplasty 06/18/2015   A-fib ([place]) 05/01/2015   Chronic anticoagulation 05/01/2015   Right foot pain 03/25/2015   Shingles (herpes zoster) polyneuropathy 02/06/2014   Constipation 01/27/2012   Hypercholesteremia 01/27/2012   Hypertension, essential 01/27/2012   Immunosuppression ([place]) 01/27/2012   Obesity 01/27/2012    Past Surgical History:  Procedure Laterality Date   ABDOMINAL  HYSTERECTOMY     APPENDECTOMY     CARDIAC CATHETERIZATION N/A 04/18/2015   Procedure: Left Heart Cath and Coronary Angiography;  Surgeon: Charolette Forward, MD;  Location: [facility];  Service: Cardiovascular;  Laterality: N/A;   CHOLECYSTECTOMY     COLONOSCOPY     IR CT HEAD LTD  01/07/2020   IR PERCUTANEOUS ART THROMBECTOMY/INFUSION INTRACRANIAL INC DIAG ANGIO  01/07/2020   IR US GUIDE VASC ACCESS RIGHT  01/07/2020   [facility] TRANSPLANT     june 2011-[facility]   RADIOLOGY WITH ANESTHESIA N/A 01/07/2020   Procedure: IR WITH ANESTHESIA;  Surgeon: Radiologist, Medication, MD;  Location: [facility];  Service: Radiology;  Laterality: N/A;   TONSILLECTOMY     TOTAL SHOULDER ARTHROPLASTY Right 06/18/2015   Procedure: RIGHT TOTAL SHOULDER ARTHROPLASTY;  Surgeon: Marchia Bond, MD;  Location: [facility];  Service: Orthopedics;  Laterality: Right;    OB History   No obstetric history on file.      Home Medications    Prior to Admission medications   Medication Sig Start Date End Date Taking? Authorizing Provider  molnupiravir EUA (LAGEVRIO) 200 MG CAPS capsule Take 4 capsules (800 mg total) by mouth 2 (two) times daily for 5 days. 06/05/22 06/10/22 Yes Flossie Dibble, NP  amLODipine (NORVASC) 10 MG tablet Take 5 mg by mouth daily.  08/03/18   [provider]  apixaban (ELIQUIS) 5 MG  TABS tablet Take 1 tablet (5 mg total) by mouth 2 (two) times daily. 01/09/20   Donzetta Starch, NP  atorvastatin (LIPITOR) 40 MG tablet Take 1 tablet (40 mg total) by mouth daily. 01/10/20   Donzetta Starch, NP  furosemide (LASIX) 40 MG tablet Take 40 mg by mouth 2 (two) times daily.    [provider]  MAGNESIUM PO Take 1 tablet by mouth in the morning and at bedtime.    [provider]  metoprolol (LOPRESSOR) 50 MG tablet Take 50 mg by mouth 2 (two) times daily.    [provider]  multivitamin (RENA-VIT) TABS tablet Take 1 tablet by mouth daily. Patient not taking: Reported on 04/21/2022     [provider]  mycophenolate (CELLCEPT) 250 MG capsule Take 250 mg by mouth 2 (two) times daily.    [provider]  nystatin cream (MYCOSTATIN) Apply 1 application topically 2 (two) times daily. Patient taking differently: Apply 1 Application topically 2 (two) times daily as needed (rash). When able to remember 10/13/21   [name], FNP  pantoprazole (PROTONIX) 40 MG tablet Take 40 mg by mouth daily.  08/16/18   [provider]  potassium chloride SA (K-DUR,KLOR-CON) 20 MEQ tablet Take 20 mEq by mouth 2 (two) times daily.    [provider]  tacrolimus (PROGRAF) 0.5 MG capsule Take 0.5 mg by mouth every evening.    [provider]  tacrolimus (PROGRAF) 1 MG capsule Take 2 mg by mouth 2 (two) times daily.    [provider]  Vitamin D, Ergocalciferol, (DRISDOL) 1.25 MG (50000 UNIT) CAPS capsule Take 1 capsule (50,000 Units total) by mouth See admin instructions. Take 50,000 units by mouth twice a week on Thursday and Sunday 03/13/22   [name], FNP    Family History Family History  Problem Relation Age of Onset   Dementia Mother    Cancer Father    Breast cancer Neg Hx    Sleep apnea Neg Hx     Social History Social History   Tobacco Use   Smoking status: Never   Smokeless tobacco: Never  Vaping Use   Vaping Use: Never used  Substance Use Topics   Alcohol use: No   Drug use: No     Allergies   Vancomycin and Iohexol   Review of Systems Review of Systems  Constitutional:  Positive for fatigue (at baseline per patient statement). Negative for activity change, appetite change, chills and fever.  HENT:  Positive for congestion and rhinorrhea. Negative for ear discharge, ear pain, facial swelling, postnasal drip, sinus pressure, sinus pain, sore throat and trouble swallowing.   Respiratory:  Negative for cough, chest tightness, shortness of breath and wheezing.   Cardiovascular:  Negative for chest pain and  palpitations.  Gastrointestinal:  Negative for abdominal pain, diarrhea, nausea and vomiting.  Musculoskeletal:  Negative for myalgias.     Physical Exam Triage Vital Signs ED Triage Vitals  Enc Vitals Group     BP 06/05/22 1237 126/75     Pulse Rate 06/05/22 1237 63     Resp 06/05/22 1237 18     Temp 06/05/22 1237 99.7 F (37.6 C)     Temp Source 06/05/22 1237 Oral     SpO2 06/05/22 1237 96 %     Weight --      Height --      Head Circumference --      Peak Flow --  Pain Score 06/05/22 1236 0     Pain Loc --      Pain Edu? --      Excl. in [place]? --    No data found.  Updated Vital Signs BP 126/75 (BP Location: Left Arm)   Pulse 63   Temp 99.7 F (37.6 C) (Oral)   Resp 18   SpO2 96%      Physical Exam Vitals and nursing note reviewed.  Constitutional:      Appearance: Normal appearance. She is normal weight.  HENT:     Right Ear: Hearing, tympanic membrane, ear canal and external ear normal.     Left Ear: Hearing, tympanic membrane, ear canal and external ear normal.     Nose: Congestion and rhinorrhea present. No nasal tenderness. Rhinorrhea is clear.     Right Turbinates: Not enlarged, swollen or pale.     Left Turbinates: Not enlarged, swollen or pale.     Right Sinus: No maxillary sinus tenderness or frontal sinus tenderness.     Left Sinus: No maxillary sinus tenderness or frontal sinus tenderness.     Mouth/Throat:     Mouth: Mucous membranes are moist.     Pharynx: Oropharynx is clear.     Tonsils: No tonsillar exudate.  Cardiovascular:     Rate and Rhythm: Normal rate. Rhythm regularly irregular.  Pulmonary:     Effort: Pulmonary effort is normal.     Breath sounds: Normal breath sounds and air entry. No decreased breath sounds, wheezing, rhonchi or rales.  Lymphadenopathy:     Cervical: No cervical adenopathy.  Neurological:     Mental Status: She is alert.      UC Treatments / Results  Labs (all labs ordered are listed, but only  abnormal results are displayed) Labs Reviewed  SARS CORONAVIRUS 2 (TAT 6-24 HRS)    EKG   Radiology No results found.  Procedures Procedures (including critical care time)  Medications Ordered in UC Medications - No data to display  Initial Impression / Assessment and Plan / UC Course  I have reviewed the triage vital signs and the nursing notes.  Pertinent labs & imaging results that were available during my care of the patient were reviewed by me and considered in my medical decision making (see chart for details).     Patient was evaluated for nonproductive cough and nasal congestion.  Patient had recent COVID exposure through someone in her household.  COVID test was performed in office today.  Patient made aware of reporting protocol.  Patient's regular irregular rhythm is due to history of A-fib, no chest pain or cardiac symptoms that warrant further action at this time. Given patients age and medical history, antiviral therapy was started. Last kidney functioning test WNL on 02/11/2022.  Shared decision-making was used to start patient on molnupiravir.  This was the best pathway due to less drug interactions and given the patient's kidney transplant history.  Patient was made aware on how to take this medication.  Patient was made aware of red flag symptoms that would warrant an emergency department visit.  Patient verbalized understanding of instructions.  Final Clinical Impressions(s) / UC Diagnoses   Final diagnoses:  Exposure to COVID-19 virus  Acute cough     Discharge Instructions      As discussed, molnupiravir has been sent to the pharmacy, you will take 4 capsules 2 times daily for the next 5 days.  Please make sure to stay hydrated at home, drinking at  least 8 cups of water daily.  Make sure to have nutrient dense meals.    As discussed, you may take an over-the-counter medication for symptoms management.  As discussed, given your medical history please monitor  your symptoms.  If you begin having any chest pain, shortness of breath, fever, worsening fatigue go to your nearest emergency department.      ED Prescriptions     Medication Sig Dispense Auth. Provider   molnupiravir EUA (LAGEVRIO) 200 MG CAPS capsule Take 4 capsules (800 mg total) by mouth 2 (two) times daily for 5 days. 40 capsule Flossie Dibble, NP      PDMP not reviewed this encounter.   Flossie Dibble, NP 06/05/22 1329    Flossie Dibble, NP 06/05/22 1333

## 2022-06-05 NOTE — ED Triage Notes (Signed)
Pt family member in house tested positive for covid. Pt wants to get tested. Has cough and congestion for about 3 days.

## 2022-06-05 NOTE — Discharge Instructions (Addendum)
As discussed, molnupiravir has been sent to the pharmacy, you will take 4 capsules 2 times daily for the next 5 days.  Please make sure to stay hydrated at home, drinking at least 8 cups of water daily.  Make sure to have nutrient dense meals.    As discussed, you may take an over-the-counter medication for symptoms management.  As discussed, given your medical history please monitor your symptoms.  If you begin having any chest pain, shortness of breath, fever, worsening fatigue go to your nearest emergency department.

## 2022-06-06 LAB — SARS CORONAVIRUS 2 (TAT 6-24 HRS): SARS Coronavirus 2: POSITIVE — AB

## 2022-06-22 NOTE — Progress Notes (Signed)
I,Victoria T Hamilton,acting as a Education administrator for [name], FNP.,have documented all relevant documentation on the behalf of [name], FNP,as directed by  [name], FNP while in the presence of [name], [place].    Subjective:     Patient ID: Sarah Gilbert , female    DOB: [birth year] , 76 y.o.   MRN: [id number]   Chief Complaint  Patient presents with   Hypertension    HPI  Pt presents today for glucose and HTN follow up. Accompanied by her uncle.    She reports testing positive for covid 2 1/2 weeks ago. Patient states feeling good today.   Patient would also like to know how long she will have to wait to get booster after receiving flu shot today.    Hypertension This is a chronic problem. The current episode started more than 1 year ago. The problem has been gradually improving since onset. Pertinent negatives include no chest pain or headaches. Risk factors for coronary artery disease include obesity and sedentary lifestyle. Past treatments include calcium channel blockers, angiotensin blockers and beta blockers. There is no history of angina. There is no history of chronic renal disease.  Diabetes She presents for her follow-up diabetic visit. She has type 2 diabetes mellitus. There are no hypoglycemic associated symptoms. Pertinent negatives for hypoglycemia include no dizziness or headaches. There are no diabetic associated symptoms. Pertinent negatives for diabetes include no chest pain, no fatigue, no polydipsia, no polyphagia and no polyuria. There are no hypoglycemic complications. There are no diabetic complications. Risk factors for coronary artery disease include obesity and sedentary lifestyle. When asked about current treatments, none were reported. She is following a generally healthy diet. She has not had a previous visit with a dietitian. She rarely participates in exercise. She does not see a podiatrist.Eye exam is not current.     Past Medical History:   Diagnosis Date   Anemia    Arthritis    Atrial fibrillation (HCC)    Bleeding nose    history of    ESRD (end stage renal disease) (HCC)    History of bronchitis    Hyperlipemia    Hypertension    Joint inflammation    Obesity    PONV (postoperative nausea and vomiting)    Rotator cuff tear arthropathy of right shoulder 06/18/2015   Thyroid disease    Wears glasses      Family History  Problem Relation Age of Onset   Dementia Mother    Cancer Father    Breast cancer Neg Hx    Sleep apnea Neg Hx      Current Outpatient Medications:    amLODipine (NORVASC) 10 MG tablet, Take 5 mg by mouth daily. , Disp: , Rfl:    apixaban (ELIQUIS) 5 MG TABS tablet, Take 1 tablet (5 mg total) by mouth 2 (two) times daily., Disp: 60 tablet, Rfl: 2   atorvastatin (LIPITOR) 40 MG tablet, Take 1 tablet (40 mg total) by mouth daily., Disp: 30 tablet, Rfl: 2   furosemide (LASIX) 40 MG tablet, Take 40 mg by mouth 2 (two) times daily., Disp: , Rfl:    losartan (COZAAR) 25 MG tablet, Take 25 mg by mouth daily., Disp: , Rfl:    MAGNESIUM PO, Take 1 tablet by mouth in the morning and at bedtime., Disp: , Rfl:    metoprolol (LOPRESSOR) 50 MG tablet, Take 50 mg by mouth 2 (two) times daily., Disp: , Rfl:    multivitamin (RENA-VIT) TABS  tablet, Take 1 tablet by mouth daily., Disp: , Rfl:    mycophenolate (CELLCEPT) 250 MG capsule, Take 250 mg by mouth 2 (two) times daily., Disp: , Rfl:    nystatin cream (MYCOSTATIN), Apply 1 application topically 2 (two) times daily. (Patient taking differently: Apply 1 Application topically 2 (two) times daily as needed (rash). When able to remember), Disp: 30 g, Rfl: 2   pantoprazole (PROTONIX) 40 MG tablet, Take 40 mg by mouth daily. , Disp: , Rfl:    potassium chloride SA (K-DUR,KLOR-CON) 20 MEQ tablet, Take 20 mEq by mouth 2 (two) times daily., Disp: , Rfl:    tacrolimus (PROGRAF) 0.5 MG capsule, Take 0.5 mg by mouth every evening., Disp: , Rfl:    tacrolimus (PROGRAF)  1 MG capsule, Take 2 mg by mouth 2 (two) times daily., Disp: , Rfl:    Vitamin D, Ergocalciferol, (DRISDOL) 1.25 MG (50000 UNIT) CAPS capsule, Take 1 capsule (50,000 Units total) by mouth See admin instructions. Take 50,000 units by mouth twice a week on Thursday and Sunday, Disp: 24 capsule, Rfl: 1   Allergies  Allergen Reactions   Vancomycin Hives, Itching, Rash and Other (See Comments)    Blistering, swelling, and peeling of feet.  Pt denies any similar reactions around face or neck.   Iohexol      Desc: iodine contrast/per dialysis center, Onset Date: [id number]      Review of Systems  Constitutional: Negative.  Negative for fatigue.  Respiratory: Negative.    Cardiovascular: Negative.  Negative for chest pain.  Endocrine: Negative for polydipsia, polyphagia and polyuria.  Neurological: Negative.  Negative for dizziness and headaches.  Psychiatric/Behavioral: Negative.       Today's Vitals   06/23/22 0923  BP: 118/74  Pulse: 70  Temp: 98.1 F (36.7 C)  SpO2: 96%  Weight: 185 lb 3.2 oz (84 kg)  Height: _0  (1.651 m)   Body mass index is 30.82 kg/m.  Wt Readings from Last 3 Encounters:  06/23/22 185 lb 3.2 oz (84 kg)  04/21/22 186 lb (84.4 kg)  03/11/22 199 lb 8 oz (90.5 kg)    Objective:  Physical Exam Vitals reviewed.  Constitutional:      General: She is not in acute distress.    Appearance: Normal appearance. She is obese.  Cardiovascular:     Rate and Rhythm: Normal rate and regular rhythm.     Pulses: Normal pulses.     Heart sounds: Normal heart sounds. No murmur heard. Pulmonary:     Effort: Pulmonary effort is normal. No respiratory distress.     Breath sounds: Normal breath sounds. No wheezing.  Musculoskeletal:     Right lower leg: No edema.     Left lower leg: No edema.  Skin:    General: Skin is warm and dry.  Neurological:     General: No focal deficit present.     Mental Status: She is alert and oriented to person, place, and time.      Cranial Nerves: No cranial nerve deficit.  Psychiatric:        Mood and Affect: Mood normal.        Behavior: Behavior normal.        Thought Content: Thought content normal.        Judgment: Judgment normal.         Assessment And Plan:     1. Benign hypertensive heart disease without heart failure Comments: Blood pressure is well controlled, continue current medications and  f/u with Cardiology  2. Chronic atrial fibrillation (HCC) Comments: Continue Eliquis, tolerating well.  3. Hypomagnesemia Comments: Continue magnesium supplement was still slightly low at last visit - Magnesium  4. Hypokalemia Comments: Potassium level was low at last visit, will recheck today - BMP8+eGFR  5. Abnormal glucose Comments: HgbA1c is stable. No current medications.  6. Vitamin D deficiency - Vitamin D, Ergocalciferol, (DRISDOL) 1.25 MG (50000 UNIT) CAPS capsule; Take 1 capsule (50,000 Units total) by mouth See admin instructions. Take 50,000 units by mouth twice a week on Thursday and Sunday  Dispense: 24 capsule; Refill: 1 - VITAMIN D 25 Hydroxy (Vit-D Deficiency, Fractures)  7. Mixed hyperlipidemia Comments: Cholesterol levels are stable, continue statin, tolerating well.  8. Need for influenza vaccination Influenza vaccine administered Encouraged to take Tylenol as needed for fever or muscle aches. - Flu Vaccine QUAD High Dose(Fluad)  9. Class 1 obesity due to excess calories without serious comorbidity with body mass index (BMI) of 30.0 to 30.9 in adult She is encouraged to strive for BMI less than 30 to decrease cardiac risk. Advised to aim for at least 150 minutes of exercise per week.  10. Other long term (current) drug therapy - CBC  11. History of COVID-19 Comments: She is feeling better, encouraged to take stay hydrated and take vitamin c/d if able for immune system  12. Encounter for screening colonoscopy According to USPTF Colorectal cancer Screening guidelines.  Colonoscopy is recommended every 10 years, starting at age 32 years. Will refer to GI for colon cancer screening. - Ambulatory referral to Gastroenterology  13. History of ischemic stroke  14. S/P kidney transplant Comments: She is 10 years from her kidney transplant    Patient was given opportunity to ask questions. Patient verbalized understanding of the plan and was able to repeat key elements of the plan. All questions were answered to their satisfaction.  [name], FNP   I, [name], FNP, have reviewed all documentation for this visit. The documentation on 06/23/22 for the exam, diagnosis, procedures, and orders are all accurate and complete.   IF YOU HAVE BEEN REFERRED TO A SPECIALIST, IT MAY TAKE 1-2 WEEKS TO SCHEDULE/PROCESS THE REFERRAL. IF YOU HAVE NOT HEARD FROM US/SPECIALIST IN TWO WEEKS, PLEASE GIVE [name] A CALL AT [phone].   THE PATIENT IS ENCOURAGED TO PRACTICE SOCIAL DISTANCING DUE TO THE COVID-19 PANDEMIC.

## 2022-06-23 ENCOUNTER — Ambulatory Visit (INDEPENDENT_AMBULATORY_CARE_PROVIDER_SITE_OTHER): Payer: Medicare Other | Admitting: Nurse Practitioner

## 2022-06-23 ENCOUNTER — Encounter: Payer: Self-pay | Admitting: Nurse Practitioner

## 2022-06-23 VITALS — BP 118/74 | HR 70 | Temp 98.1°F | Ht 65.0 in | Wt 185.2 lb

## 2022-06-23 DIAGNOSIS — E6609 Other obesity due to excess calories: Secondary | ICD-10-CM

## 2022-06-23 DIAGNOSIS — I482 Chronic atrial fibrillation, unspecified: Secondary | ICD-10-CM | POA: Diagnosis not present

## 2022-06-23 DIAGNOSIS — Z8673 Personal history of transient ischemic attack (TIA), and cerebral infarction without residual deficits: Secondary | ICD-10-CM

## 2022-06-23 DIAGNOSIS — Z23 Encounter for immunization: Secondary | ICD-10-CM

## 2022-06-23 DIAGNOSIS — R4701 Aphasia: Secondary | ICD-10-CM

## 2022-06-23 DIAGNOSIS — Z79899 Other long term (current) drug therapy: Secondary | ICD-10-CM

## 2022-06-23 DIAGNOSIS — Z1211 Encounter for screening for malignant neoplasm of colon: Secondary | ICD-10-CM

## 2022-06-23 DIAGNOSIS — I639 Cerebral infarction, unspecified: Secondary | ICD-10-CM

## 2022-06-23 DIAGNOSIS — E876 Hypokalemia: Secondary | ICD-10-CM | POA: Diagnosis not present

## 2022-06-23 DIAGNOSIS — E782 Mixed hyperlipidemia: Secondary | ICD-10-CM

## 2022-06-23 DIAGNOSIS — I119 Hypertensive heart disease without heart failure: Secondary | ICD-10-CM

## 2022-06-23 DIAGNOSIS — E559 Vitamin D deficiency, unspecified: Secondary | ICD-10-CM

## 2022-06-23 DIAGNOSIS — D849 Immunodeficiency, unspecified: Secondary | ICD-10-CM

## 2022-06-23 DIAGNOSIS — Z940 Kidney transplant status: Secondary | ICD-10-CM

## 2022-06-23 DIAGNOSIS — Z6830 Body mass index (BMI) 30.0-30.9, adult: Secondary | ICD-10-CM

## 2022-06-23 DIAGNOSIS — R7309 Other abnormal glucose: Secondary | ICD-10-CM

## 2022-06-23 DIAGNOSIS — Z8616 Personal history of COVID-19: Secondary | ICD-10-CM

## 2022-06-23 MED ORDER — VITAMIN D (ERGOCALCIFEROL) 1.25 MG (50000 UNIT) PO CAPS: 50000.0000 [IU] | ORAL_CAPSULE | ORAL | 1 refills | Status: DC

## 2022-06-23 NOTE — Patient Instructions (Addendum)
Hypertension, Adult Hypertension is another name for high blood pressure. High blood pressure forces your heart to work harder to pump blood. This can cause problems over time. There are two numbers in a blood pressure reading. There is a top number (systolic) over a bottom number (diastolic). It is best to have a blood pressure that is below 120/80. What are the causes? The cause of this condition is not known. Some other conditions can lead to high blood pressure. What increases the risk? Some lifestyle factors can make you more likely to develop high blood pressure: Smoking. Not getting enough exercise or physical activity. Being overweight. Having too much fat, sugar, calories, or salt (sodium) in your diet. Drinking too much alcohol. Other risk factors include: Having any of these conditions: Heart disease. Diabetes. High cholesterol. Kidney disease. Obstructive sleep apnea. Having a family history of high blood pressure and high cholesterol. Age. The risk increases with age. Stress. What are the signs or symptoms? High blood pressure may not cause symptoms. Very high blood pressure (hypertensive crisis) may cause: Headache. Fast or uneven heartbeats (palpitations). Shortness of breath. Nosebleed. Vomiting or feeling like you may vomit (nauseous). Changes in how you see. Very bad chest pain. Feeling dizzy. Seizures. How is this treated? This condition is treated by making healthy lifestyle changes, such as: Eating healthy foods. Exercising more. Drinking less alcohol. Your doctor may prescribe medicine if lifestyle changes do not help enough and if: Your top number is above 130. Your bottom number is above 80. Your personal target blood pressure may vary. Follow these instructions at home: Eating and drinking  If told, follow the DASH eating plan. To follow this plan: Fill one half of your plate at each meal with fruits and vegetables. Fill one fourth of your plate  at each meal with whole grains. Whole grains include whole-wheat pasta, brown rice, and whole-grain bread. Eat or drink low-fat dairy products, such as skim milk or low-fat yogurt. Fill one fourth of your plate at each meal with low-fat (lean) proteins. Low-fat proteins include fish, chicken without skin, eggs, beans, and tofu. Avoid fatty meat, cured and processed meat, or chicken with skin. Avoid pre-made or processed food. Limit the amount of salt in your diet to less than 1,500 mg each day. Do not drink alcohol if: Your doctor tells you not to drink. You are pregnant, may be pregnant, or are planning to become pregnant. If you drink alcohol: Limit how much you have to: 0-1 drink a day for women. 0-2 drinks a day for men. Know how much alcohol is in your drink. In the [place] one drink equals one 12 oz bottle of beer (355 mL), one 5 oz glass of wine (148 mL), or one 1 oz glass of hard liquor (44 mL). Lifestyle  Work with your doctor to stay at a healthy weight or to lose weight. Ask your doctor what the best weight is for you. Get at least 30 minutes of exercise that causes your heart to beat faster (aerobic exercise) most days of the week. This may include walking, swimming, or biking. Get at least 30 minutes of exercise that strengthens your muscles (resistance exercise) at least 3 days a week. This may include lifting weights or doing Pilates. Do not smoke or use any products that contain nicotine or tobacco. If you need help quitting, ask your doctor. Check your blood pressure at home as told by your doctor. Keep all follow-up visits. Medicines Take over-the-counter and prescription medicines  only as told by your doctor. Follow directions carefully. Do not skip doses of blood pressure medicine. The medicine does not work as well if you skip doses. Skipping doses also puts you at risk for problems. Ask your doctor about side effects or reactions to medicines that you should watch  for. Contact a doctor if: You think you are having a reaction to the medicine you are taking. You have headaches that keep coming back. You feel dizzy. You have swelling in your ankles. You have trouble with your vision. Get help right away if: You get a very bad headache. You start to feel mixed up (confused). You feel weak or numb. You feel faint. You have very bad pain in your: Chest. Belly (abdomen). You vomit more than once. You have trouble breathing. These symptoms may be an emergency. Get help right away. Call 911. Do not wait to see if the symptoms will go away. Do not drive yourself to the hospital. Summary Hypertension is another name for high blood pressure. High blood pressure forces your heart to work harder to pump blood. For most people, a normal blood pressure is less than 120/80. Making healthy choices can help lower blood pressure. If your blood pressure does not get lower with healthy choices, you may need to take medicine. This information is not intended to replace advice given to you by your health care provider. Make sure you discuss any questions you have with your health care provider. Document Revised: 05/22/2021 Document Reviewed: 05/22/2021 Elsevier Patient Education  [place].   Influenza (Flu) Vaccine (Inactivated or Recombinant): What You Need to Know 1. Why get vaccinated? Influenza vaccine can prevent influenza (flu). Flu is a contagious disease that spreads around the [place] every year, usually between October and May. Anyone can get the flu, but it is more dangerous for some people. Infants and young children, people 60 years and older, pregnant people, and people with certain health conditions or a weakened immune system are at greatest risk of flu complications. Pneumonia, bronchitis, sinus infections, and ear infections are examples of flu-related complications. If you have a medical condition, such as heart disease, cancer, or  diabetes, flu can make it worse. Flu can cause fever and chills, sore throat, muscle aches, fatigue, cough, headache, and runny or stuffy nose. Some people may have vomiting and diarrhea, though this is more common in children than adults. In an average year, thousands of people in the [place] die from flu, and many more are hospitalized. Flu vaccine prevents millions of illnesses and flu-related visits to the doctor each year. 2. Influenza vaccines CDC recommends everyone 6 months and older get vaccinated every flu season. Children 6 months through 85 years of age may need 2 doses during a single flu season. Everyone else needs only 1 dose each flu season. It takes about 2 weeks for protection to develop after vaccination. There are many flu viruses, and they are always changing. Each year a new flu vaccine is made to protect against the influenza viruses believed to be likely to cause disease in the upcoming flu season. Even when the vaccine doesn't exactly match these viruses, it may still provide some protection. Influenza vaccine does not cause flu. Influenza vaccine may be given at the same time as other vaccines. 3. Talk with your health care provider Tell your vaccination provider if the person getting the vaccine: Has had an allergic reaction after a previous dose of influenza vaccine, or has any  severe, life-threatening allergies Has ever had Guillain-Barr Syndrome (also called "GBS") In some cases, your health care provider may decide to postpone influenza vaccination until a future visit. Influenza vaccine can be administered at any time during pregnancy. People who are or will be pregnant during influenza season should receive inactivated influenza vaccine. People with minor illnesses, such as a cold, may be vaccinated. People who are moderately or severely ill should usually wait until they recover before getting influenza vaccine. Your health care provider can give you more  information. 4. Risks of a vaccine reaction Soreness, redness, and swelling where the shot is given, fever, muscle aches, and headache can happen after influenza vaccination. There may be a very small increased risk of Guillain-Barr Syndrome (GBS) after inactivated influenza vaccine (the flu shot). Young children who get the flu shot along with pneumococcal vaccine (PCV13) and/or DTaP vaccine at the same time might be slightly more likely to have a seizure caused by fever. Tell your health care provider if a child who is getting flu vaccine has ever had a seizure. People sometimes faint after medical procedures, including vaccination. Tell your provider if you feel dizzy or have vision changes or ringing in the ears. As with any medicine, there is a very remote chance of a vaccine causing a severe allergic reaction, other serious injury, or death. 5. What if there is a serious problem? An allergic reaction could occur after the vaccinated person leaves the clinic. If you see signs of a severe allergic reaction (hives, swelling of the face and throat, difficulty breathing, a fast heartbeat, dizziness, or weakness), call 9-1-1 and get the person to the nearest hospital. For other signs that concern you, call your health care provider. Adverse reactions should be reported to the Vaccine Adverse Event Reporting System (VAERS). Your health care provider will usually file this report, or you can do it yourself. Visit the VAERS website at www.vaers.SamedayNews.es or call 725-680-9940. VAERS is only for reporting reactions, and VAERS staff members do not give medical advice. 6. The National Vaccine Injury Compensation Program The [employer] Vaccine Injury Compensation Program (VICP) is a federal program that was created to compensate people who may have been injured by certain vaccines. Claims regarding alleged injury or death due to vaccination have a time limit for filing, which may be as short as two years. Visit  the VICP website at GoldCloset.com.ee or call 270-467-1931 to learn about the program and about filing a claim. 7. How can I learn more? Ask your health care provider. Call your local or state health department. Visit the website of the Food and Drug Administration (FDA) for vaccine package inserts and additional information at TraderRating.uy. Contact the Centers for Disease Control and Prevention (CDC): Call (936)026-0515 (1-800-CDC-INFO) or Visit CDC's website at https://gibson.com/. Source: CDC Vaccine Information Statement Inactivated Influenza Vaccine (03/22/2020) This same material is available at http://www.wolf.info/ for no charge. This information is not intended to replace advice given to you by your health care provider. Make sure you discuss any questions you have with your health care provider. Document Revised: 07/01/2021 Document Reviewed: 04/24/2021 Elsevier Patient Education  [place].

## 2022-06-24 LAB — CBC
Hematocrit: 39.4 % (ref 34.0–46.6)
Hemoglobin: 12.9 g/dL (ref 11.1–15.9)
MCH: 29.1 pg (ref 26.6–33.0)
MCHC: 32.7 g/dL (ref 31.5–35.7)
MCV: 89 fL (ref 79–97)
Platelets: 247 10*3/uL (ref 150–450)
RBC: 4.43 x10E6/uL (ref 3.77–5.28)
RDW: 13.2 % (ref 11.7–15.4)
WBC: 5.2 10*3/uL (ref 3.4–10.8)

## 2022-06-24 LAB — BMP8+EGFR
BUN/Creatinine Ratio: 16 (ref 12–28)
BUN: 14 mg/dL (ref 8–27)
CO2: 23 mmol/L (ref 20–29)
Calcium: 10 mg/dL (ref 8.7–10.3)
Chloride: 103 mmol/L (ref 96–106)
Creatinine, Ser: 0.86 mg/dL (ref 0.57–1.00)
Glucose: 106 mg/dL — ABNORMAL HIGH (ref 70–99)
Potassium: 3.2 mmol/L — ABNORMAL LOW (ref 3.5–5.2)
Sodium: 145 mmol/L — ABNORMAL HIGH (ref 134–144)
eGFR: 71 mL/min/{1.73_m2} (ref >59)

## 2022-06-24 LAB — VITAMIN D 25 HYDROXY (VIT D DEFICIENCY, FRACTURES): Vit D, 25-Hydroxy: 47.6 ng/mL (ref 30.0–100.0)

## 2022-06-24 LAB — MAGNESIUM: Magnesium: 1.1 mg/dL — ABNORMAL LOW (ref 1.6–2.3)

## 2022-07-21 ENCOUNTER — Ambulatory Visit (INDEPENDENT_AMBULATORY_CARE_PROVIDER_SITE_OTHER): Payer: Medicare Other

## 2022-07-21 VITALS — BP 126/74 | HR 53 | Temp 98.1°F | Ht 65.0 in | Wt 185.0 lb

## 2022-07-21 DIAGNOSIS — Z23 Encounter for immunization: Secondary | ICD-10-CM

## 2022-07-21 NOTE — Progress Notes (Signed)
Pt presents today for penumonia vaccine.

## 2022-07-30 ENCOUNTER — Telehealth: Payer: Self-pay | Admitting: Nurse Practitioner

## 2022-07-30 NOTE — Telephone Encounter (Signed)
d 

## 2022-08-03 ENCOUNTER — Other Ambulatory Visit: Payer: Self-pay

## 2022-08-03 ENCOUNTER — Emergency Department (HOSPITAL_COMMUNITY)
Admission: EM | Admit: 2022-08-03 | Discharge: 2022-08-04 | Disposition: A | Discharge status: Home / Self Care | Payer: Medicare Other | Attending: Emergency Medicine | Admitting: Emergency Medicine

## 2022-08-03 DIAGNOSIS — Z7901 Long term (current) use of anticoagulants: Secondary | ICD-10-CM | POA: Diagnosis not present

## 2022-08-03 DIAGNOSIS — T50901A Poisoning by unspecified drugs, medicaments and biological substances, accidental (unintentional), initial encounter: Secondary | ICD-10-CM | POA: Diagnosis present

## 2022-08-03 LAB — CBC WITH DIFFERENTIAL/PLATELET
Abs Immature Granulocytes: 0.02 10*3/uL (ref 0.00–0.07)
Basophils Absolute: 0 10*3/uL (ref 0.0–0.1)
Basophils Relative: 1 %
Eosinophils Absolute: 0.3 10*3/uL (ref 0.0–0.5)
Eosinophils Relative: 4 %
HCT: 36.6 % (ref 36.0–46.0)
Hemoglobin: 11.9 g/dL — ABNORMAL LOW (ref 12.0–15.0)
Immature Granulocytes: 0 %
Lymphocytes Relative: 26 %
Lymphs Abs: 1.6 10*3/uL (ref 0.7–4.0)
MCH: 30.1 pg (ref 26.0–34.0)
MCHC: 32.5 g/dL (ref 30.0–36.0)
MCV: 92.7 fL (ref 80.0–100.0)
Monocytes Absolute: 0.6 10*3/uL (ref 0.1–1.0)
Monocytes Relative: 10 %
Neutro Abs: 3.7 10*3/uL (ref 1.7–7.7)
Neutrophils Relative %: 59 %
Platelets: 233 10*3/uL (ref 150–400)
RBC: 3.95 MIL/uL (ref 3.87–5.11)
RDW: 13.8 % (ref 11.5–15.5)
WBC: 6.2 10*3/uL (ref 4.0–10.5)
nRBC: 0 % (ref 0.0–0.2)

## 2022-08-03 LAB — COMPREHENSIVE METABOLIC PANEL
ALT: 23 U/L (ref 0–44)
AST: 28 U/L (ref 15–41)
Albumin: 3.9 g/dL (ref 3.5–5.0)
Alkaline Phosphatase: 85 U/L (ref 38–126)
Anion gap: 10 (ref 5–15)
BUN: 14 mg/dL (ref 8–23)
CO2: 27 mmol/L (ref 22–32)
Calcium: 9.7 mg/dL (ref 8.9–10.3)
Chloride: 104 mmol/L (ref 98–111)
Creatinine, Ser: 0.99 mg/dL (ref 0.44–1.00)
GFR, Estimated: 60 mL/min — ABNORMAL LOW (ref >60)
Glucose, Bld: 107 mg/dL — ABNORMAL HIGH (ref 70–99)
Potassium: 3.9 mmol/L (ref 3.5–5.1)
Sodium: 141 mmol/L (ref 135–145)
Total Bilirubin: 1 mg/dL (ref 0.3–1.2)
Total Protein: 7.4 g/dL (ref 6.5–8.1)

## 2022-08-03 LAB — PROTIME-INR
INR: 1.9 — ABNORMAL HIGH (ref 0.8–1.2)
Prothrombin Time: 21.2 seconds — ABNORMAL HIGH (ref 11.4–15.2)

## 2022-08-03 LAB — APTT: aPTT: 34 seconds (ref 24–36)

## 2022-08-03 NOTE — ED Triage Notes (Signed)
Patient accidentally took her night time medications ( Eliquis 5 mg; Metropolol 50 mg ;Furosemide 40 mg ) twice this evening , denies any symptoms , no pain / respirations unlabored .

## 2022-08-03 NOTE — ED Provider Triage Note (Signed)
Emergency Medicine Provider Triage Evaluation Note  Kresha Abelson , a 76 y.o. female  was evaluated in triage.  Pt complains of accidental overdose of Lasix, metoprolol, Eliquis.  Patient reports that she accidentally took a repeat dose of all these medications around 7:30 PM this evening.  Denies any symptoms at this time but states she is feeling a little bit more tired than normal but this is also later than her typical bedtime.  No obvious bruising.  Denies any headaches, nausea, vomiting, abdominal pain.  Review of Systems  Positive: As above Negative: As above  Physical Exam  BP (!) 142/68 (BP Location: Right Arm)   Pulse (!) 124   Temp 98.3 F (36.8 C)   Resp 16   SpO2 99%  Gen:   Awake, no distress Resp:  Normal effort MSK:   Moves extremities without difficulty Other:  Medical Decision Making  Medically screening exam initiated at 9:52 PM.  Appropriate orders placed.  Ilanna Deihl was informed that the remainder of the evaluation will be completed by another provider, this initial triage assessment does not replace that evaluation, and the importance of remaining in the ED until their evaluation is complete.     Luvenia Heller, PA-C 08/03/22 2153

## 2022-08-04 NOTE — ED Provider Notes (Signed)
[facility] EMERGENCY DEPARTMENT Provider Note   CSN: [id number] Arrival date & time: 08/03/22  1959     History  Chief Complaint  Patient presents with   Accidental Rx Overdose    Sarah Gilbert is a 76 y.o. female.  HPI Patient presents with her uncle who assists with the history as she has a history of stroke. Patient has multiple other medical problems including prior kidney transplant.  Last night, about 18 hours ago she took double her typical evening dose of the following medications, Eliquis, metoprolol, furosemide.  This was accidental.  Currently she denies complaints, it is approximately 18 hours after that ingestion.  She knows that she briefly felt sluggish, but that resolved spontaneously.    Home Medications Prior to Admission medications   Medication Sig Start Date End Date Taking? Authorizing Provider  amLODipine (NORVASC) 10 MG tablet Take 5 mg by mouth daily.  08/03/18   [provider]  apixaban (ELIQUIS) 5 MG TABS tablet Take 1 tablet (5 mg total) by mouth 2 (two) times daily. 01/09/20   Donzetta Starch, NP  atorvastatin (LIPITOR) 40 MG tablet Take 1 tablet (40 mg total) by mouth daily. 01/10/20   Donzetta Starch, NP  furosemide (LASIX) 40 MG tablet Take 40 mg by mouth 2 (two) times daily.    [provider]  losartan (COZAAR) 25 MG tablet Take 25 mg by mouth daily.    [provider]  MAGNESIUM PO Take 1 tablet by mouth in the morning and at bedtime.    [provider]  metoprolol (LOPRESSOR) 50 MG tablet Take 50 mg by mouth 2 (two) times daily.    [provider]  multivitamin (RENA-VIT) TABS tablet Take 1 tablet by mouth daily.    [provider]  mycophenolate (CELLCEPT) 250 MG capsule Take 250 mg by mouth 2 (two) times daily.    [provider]  nystatin cream (MYCOSTATIN) Apply 1 application topically 2 (two) times daily. Patient taking differently: Apply 1 Application  topically 2 (two) times daily as needed (rash). When able to remember 10/13/21   [name], FNP  pantoprazole (PROTONIX) 40 MG tablet Take 40 mg by mouth daily.  08/16/18   [provider]  potassium chloride SA (K-DUR,KLOR-CON) 20 MEQ tablet Take 20 mEq by mouth 2 (two) times daily.    [provider]  tacrolimus (PROGRAF) 0.5 MG capsule Take 0.5 mg by mouth every evening.    [provider]  tacrolimus (PROGRAF) 1 MG capsule Take 2 mg by mouth 2 (two) times daily.    [provider]  Vitamin D, Ergocalciferol, (DRISDOL) 1.25 MG (50000 UNIT) CAPS capsule Take 1 capsule (50,000 Units total) by mouth See admin instructions. Take 50,000 units by mouth twice a week on Thursday and Sunday 06/23/22   [name], FNP      Allergies    Vancomycin and Iohexol    Review of Systems   Review of Systems  All other systems reviewed and are negative.   Physical Exam Updated Vital Signs BP (!) 147/87 (BP Location: Right Arm)   Pulse 92   Temp 98.1 F (36.7 C)   Resp 16   SpO2 100%  Physical Exam Vitals and nursing note reviewed.  Constitutional:      General: She is not in acute distress.    Appearance: She is well-developed.  HENT:     Head: Normocephalic and atraumatic.  Eyes:     Conjunctiva/sclera:  Conjunctivae normal.  Cardiovascular:     Rate and Rhythm: Normal rate and regular rhythm.  Pulmonary:     Effort: Pulmonary effort is normal. No respiratory distress.     Breath sounds: Normal breath sounds. No stridor.  Abdominal:     General: There is no distension.  Skin:    General: Skin is warm and dry.  Neurological:     Mental Status: She is alert and oriented to person, place, and time.     Cranial Nerves: No cranial nerve deficit.  Psychiatric:        Mood and Affect: Mood normal.     ED Results / Procedures / Treatments   Labs (all labs ordered are listed, but only abnormal results are displayed) Labs Reviewed  PROTIME-INR -  Abnormal; Notable for the following components:      Result Value   Prothrombin Time 21.2 (*)    INR 1.9 (*)    All other components within normal limits  CBC WITH DIFFERENTIAL/PLATELET - Abnormal; Notable for the following components:   Hemoglobin 11.9 (*)    All other components within normal limits  COMPREHENSIVE METABOLIC PANEL - Abnormal; Notable for the following components:   Glucose, Bld 107 (*)    GFR, Estimated 60 (*)    All other components within normal limits  APTT    EKG Fibrillation, 64 rate, artifact, abnormal  Radiology No results found.  Procedures Procedures    Medications Ordered in ED Medications - No data to display  ED Course/ Medical Decision Making/ A&P This patient with a Hx of renal transplant, hypertension, A-fib presents to the ED for concern of accidental overdose, sluggishness, this involves an extensive number of treatment options, and is a complaint that carries with it a high risk of complications and morbidity.    The differential diagnosis includes cardiac manifestation, electrolyte abnormalities, toxic syndrome   Social Determinants of Health:  Prior stroke, advanced age  Additional history obtained:  Additional history and/or information obtained from uncle at bedside, notable for history   After the initial evaluation, orders, including: Labs ECG all from triage were initiated.  Lab Tests:  I personally interpreted labs.  The pertinent results include: Unremarkable labs, no evidence for substantial coagulopathy   Dispostion / Final MDM:  After consideration of the diagnostic results and the patient's response to treatment, patient has been monitored for hours, a large portion of which were in the waiting room.  She is awake, alert, has no ongoing complaints, his past time for metabolization of her excess dosing, has no evidence for endorgan damage or other acute findings.  We discussed all findings at length, discussed  timetable to return to her regular dosing regimen, and the hospitalization was a consideration given the accidental overdose, patient is appropriate for outpatient follow-up.   Final Clinical Impression(s) / ED Diagnoses Final diagnoses:  Accidental overdose, initial encounter    Rx / DC Orders ED Discharge Orders     None         Carmin Muskrat, MD 08/04/22 1406

## 2022-08-04 NOTE — Discharge Instructions (Signed)
As discussed, your evaluation today has been largely reassuring.  But, it is important that you monitor your condition carefully, and do not hesitate to return to the ED if you develop new, or concerning changes in your condition.  It is important to resume your medications tomorrow along with the prescribed guidelines, aside from the following medications which you should take tonight: Lopressor 50 mg CellCept 250 mg Eliquis 5 mg Prograf your current dosing

## 2022-08-04 NOTE — ED Notes (Signed)
Discharge instructions discussed with pt. Verbalized understanding. VSS. No questions or concerns regarding discharge  

## 2022-08-05 ENCOUNTER — Telehealth: Payer: Self-pay

## 2022-08-05 NOTE — Telephone Encounter (Signed)
Transition Care Management Follow-up Telephone Call Date of discharge and from where: 08/03/2022  How have you been since you were released from the hospital? Pt states she feels fine. Pt states she will give the office a call back since she has other upcoming apts.  Any questions or concerns? No  Items Reviewed: Did the pt receive and understand the discharge instructions provided? Yes  Medications obtained and verified? Yes  Other? No  Any new allergies since your discharge? No  Dietary orders reviewed? Yes Do you have support at home? Yes   Home Care and Equipment/Supplies: Were home health services ordered? no If so, what is the name of the agency? N/a  Has the agency set up a time to come to the patient's home? no Were any new equipment or medical supplies ordered?  No What is the name of the medical supply agency? N/a Were you able to get the supplies/equipment? no Do you have any questions related to the use of the equipment or supplies? No  Functional Questionnaire: (I = Independent and D = Dependent) ADLs: i  Bathing/Dressing- i  Meal Prep- i  Eating- i  Maintaining continence- i  Transferring/Ambulation- i  Managing Meds- i  Follow up appointments reviewed:  PCP Hospital f/u appt confirmed? Yes  Scheduled to see n/a on n/a @ n/a. [facility] f/u appt confirmed? No  Scheduled to see n/a on n/a @ n/a. Are transportation arrangements needed? No  If their condition worsens, is the pt aware to call PCP or go to the Emergency Dept.? Yes Was the patient provided with contact information for the PCP's office or ED? Yes Was to pt encouraged to call back with questions or concerns? Yes

## 2022-08-18 ENCOUNTER — Ambulatory Visit: Payer: Medicare Other | Admitting: Nurse Practitioner

## 2022-09-15 NOTE — Progress Notes (Unsigned)
[facility] [address] [place]. [place] [address] [phone]       STROKE FOLLOW UP NOTE  [name] Date of Birth:  17-Dec-1945 Medical Record Number:  [id number]   Reason for Referral: stroke follow up    SUBJECTIVE:   CHIEF COMPLAINT:  No chief complaint on file.    HPI:   Update 09/16/2022 JM: Patient returns for 63-monthstroke follow-up.  Overall stable without new stroke/TIA symptoms.  Residual cognitive impairment ***.  Maintains ADLs independently, lives with Sarah Gilbert who assists with IADLs.  Compliant with Eliquis and atorvastatin Blood pressure well-controlled She was seen by Sarah Gilbert 04/21/2022 for OSA as she was interested in pursuing CPAP but at that visit, declined interest in CPAP therapy.     History provided for reference purposes only Update 03/10/2022 JM: Patient is being seen today for hospital follow-up accompanied by her Sarah Gilbert.  [facility] 2 episodes of aphasia lasting 15 to 20 minutes which resolved without intervention.  MRI negative for acute stroke.  Etiology of symptoms likely TIA in setting of A-fib on Eliquis. DDx including seizure as she was unable to recall event, denies loss of consciousness.  MRI and carotid Dopplers unremarkable.  EF 65 to 70%.  EEG normal.  LDL 59 on atorvastatin 40 mg daily.  A1c 5.7. per note review, patient reported she may have missed some doses of Eliquis but wished to continue Eliquis with supervision from family.  She returned back to baseline and was discharged home with family.  Since discharge, she has been doing well without any reoccurring episodes.  Reports continued mild cognitive difficulties with short-term memory impairment.  Worked with SLP and completed back in January with noting some improvement.  MMSE today 26/30 with deficits in attention/calculation and recall.  Doing compensation strategies. Does not do any type of memory exercises at home which  has been discussed multiple times previously.  Continues to reside with her Sarah Gilbert.  Able to maintain ADLs independently but Sarah Gilbert does assist with majority of IADLs.  She has since ensured compliance with Eliquis and atorvastatin, denies side effects, Sarah Gilbert assists with management Blood pressure today 148/94 - monitors at home, typically 110-120s/60s She has not yet obtained CPAP machine despite new order being placed after prior visit back in November. Per Sarah Gilbert, he was [place] in [place] having issues obtaining CPAP machines but he also noted being told this last February.  No further concerns at this time     HOSPITALIZATION PERTINENT IMAGING/LABS Code Stroke CT head No acute abnormality. ASPECTS 10.    MRI  No acute infarct MRA  Normal intracranial MRA Carotid Doppler  minimal thickening or plaque bilaterally 2D Echo EF 65-70%, severely dilated left atrium, moderate tricuspid regurgitation, no atrial level shunt EEG no seizure. LDL 59 HgbA1c 5.7   Update 06/18/2021 JM: Returns for 646-monthtroke follow-up accompanied by her Sarah Gilbert, Sarah RobinsonOverall stable from stroke standpoint since prior visit without new stroke/TIA symptoms Continued short-term memory difficulties - stable.  Continue with ADLs and majority of ADLs independently.  She continues to live with her Sarah Gilbert. She is very frustrated regarding her continued short-term memory difficulties. She is frustrated that she cannot remember things like she used to such as where she set something down, things people recently said to her, etc. Long term intact. She denies depression/anxiety but during visit, likely underlying anxiety contributing. She does admit to  not doing ANY memory exercises (which has been discussed at prior few visits). She tries to keep active (involved in church, cares for her elderly aunt) but no routine exercise.  Reports she sleeps well at night.  Appetite  good.  Compliant on Eliquis and atorvastatin -denies side effects Blood pressure today 128/62 -routinely monitors at home and typically stable Routinely followed by PCP Sarah Brine, FNP   CPAP not yet started - per Sarah Gilbert, he spoke to DME since prior visit but they did not have any machines available - was told they would call him once one available but has not heard anything yet.   No new concerns at this time  Update 12/16/2020 JM: Sarah Gilbert returns for 30-monthstroke follow-up accompanied by her Sarah Gilbert, Sarah Marchfrom stroke standpoint without new stroke/TIA symptoms Reports continued short-term memory without worsening but denies improvement.  She does not do any memory exercises nor routine physical activity or exercise.  She is able to maintain ADLs independently and majority of IADLs.  She continues to live with her Sarah Gilbert who assists as needed Compliant on Eliquis and atorvastatin without associated side effects Blood pressure today 137/82 - monitors at home and typically lower 110-120/80s   She wished to initiate CPAP at prior follow-up visit which was initiated by Dr. ATori Milksnurse KCyril MourningDinkins, RN. Per epic review, [facility] attempted to reach him multiple times in February but per pt and Sarah Gilbert, they did not receive any calls to start.   No further concerns at this time   Update 06/17/2020 JM: Ms. DKongreturns for stroke follow-up accompanied by her Sarah Gilbert.  C/o short term memory difficulty since her stroke such as misplacing items as well as difficulty with comprehension or remembering what is being told her.  This has been stable without worsening.  She is able to maintain her ADLs and majority of IADLs independently. Hx of mild short-term memory impairment prior to her stroke.  She was evaluated by speech therapy at home and discharged as SLP not indicated per patient.  Denies new or worsening stroke/TIA symptoms.  She remains on Eliquis and atorvastatin for secondary  stroke prevention without side effects.  Blood pressure today 136/72.  Evaluated by Dr. ARexene Albertsfor possible sleep apnea undergoing baseline sleep study on 03/31/2020 which did show evidence of mild sleep apnea and recommended initiating AutoPap therapy.  She was should further speak with her cardiologist and nephrologist prior to initiating who recommended treatment for sleep apnea and is now ready to proceed with AutoPap therapy.  No further concerns at this time.  Initial visit 02/13/2020 JM: Ms. DHungeris being seen for hospital follow-up accompanied by her Sarah Gilbert.  She has been doing well since discharge without residual stroke deficits.  [facility] has since [facility].  She does report increased fatigue since her stroke.  Sleeps well at night.  Sleeps alone but denies snoring or witnessed apneas.  She has not previously underwent sleep study to rule out sleep apnea.  Continues on Eliquis 5 mg twice daily without bleeding or bruising.  Continues to follow with cardiologist Dr. HTerrence Dupontroutinely.  Continues on atorvastatin 40 mg daily without myalgias.  Blood pressure today 143/80.  No concerns at this time.  Stroke admission 01/07/2020 Ms. AAutumn Pruittis a 77y.o. female with history of double kidney transplant (2011), HTN, . A fib (on xarelto), HLD who presented on 01/07/2020 with L isded weakness, R gaze, L facial and slurred  speech.  Stroke work-up revealed several small right MCA infarcts w/ right ICA terminus thrombus s/p TICI 3 revascularization, infarcts embolic secondary to known AF off AC.  MRA head/neck unremarkable post IR.  Previously on Xarelto but per report, missed 3 to 4 days due to running out of prescription and transition to Eliquis 5 mg twice daily for secondary stroke prevention and history of atrial fibrillation.  History of HTN and resumed home amlodipine, Lasix and metoprolol.  History of HLD with LDL 74 and increased home dose atorvastatin from 20 mg to 40  mg daily.  Other stroke risk factors include advanced age and obesity but no prior stroke history.  Other active problems include ESRD s/p bilateral kidney transplant on Prograf and CellCept (not on HD), thyroid disease and hypokalemia.  Evaluated by therapies and discharged home with home health PT/OT.   Stroke:   Severeal small R MCA infarcts embolic secondary to known AF off AC Code Stroke CT head hyperdense R supraclinoid ICA. ASPECTS 10.    Cerebral angio R ICA terminus thrombus s/p TICI3 revascularization w/ 2 passes Earleen Newport) Post IR CT no hemorrhage, no statin  MRI  Several small scattered R hemisphere infarcts MRA head mild intracranial atherosclerosis  MRA neck Unremarkable  2D Echo EF 60-65%. No source of embolus. LA dilated LDL 74 -increase atorvastatin from 20 mg to 40 mg daily HgbA1c 6.1 Xarelto (rivaroxaban) daily prior to admission but had missed 3-4 days, now on Eliquis 5 mg bid.  Therapy recommendations:  HH PT, [place] OT - [place] Disposition:  return home      ROS:   14 system review of systems performed and negative with exception of short-term memory loss  PMH:  Past Medical History:  Diagnosis Date   Anemia    Arthritis    Atrial fibrillation ([place])    Bleeding nose    history of    ESRD (end stage renal disease) (HCC)    History of bronchitis    Hyperlipemia    Hypertension    Joint inflammation    Obesity    PONV (postoperative nausea and vomiting)    Rotator cuff tear arthropathy of right shoulder 06/18/2015   Thyroid disease    Wears glasses     PSH:  Past Surgical History:  Procedure Laterality Date   ABDOMINAL HYSTERECTOMY     APPENDECTOMY     CARDIAC CATHETERIZATION N/A 04/18/2015   Procedure: Left Heart Cath and Coronary Angiography;  Surgeon: Charolette Forward, MD;  Location: [place];  Service: Cardiovascular;  Laterality: N/A;   CHOLECYSTECTOMY     COLONOSCOPY     IR CT HEAD LTD  01/07/2020   IR PERCUTANEOUS ART THROMBECTOMY/INFUSION  INTRACRANIAL INC DIAG ANGIO  01/07/2020   IR US GUIDE VASC ACCESS RIGHT  01/07/2020   KIDNEY TRANSPLANT     june 2011-[facility]   RADIOLOGY WITH ANESTHESIA N/A 01/07/2020   Procedure: IR WITH ANESTHESIA;  Surgeon: Radiologist, Medication, MD;  Location: [facility];  Service: Radiology;  Laterality: N/A;   TONSILLECTOMY     TOTAL SHOULDER ARTHROPLASTY Right 06/18/2015   Procedure: RIGHT TOTAL SHOULDER ARTHROPLASTY;  Surgeon: Marchia Bond, MD;  Location: [facility];  Service: Orthopedics;  Laterality: Right;    Social History:  Social History   Socioeconomic History   Marital status: Single    Spouse name: Not on file   Number of children: Not on file   Years of education: Not on file   Highest education level: Not on  file  Occupational History   Occupation: retired  Tobacco Use   Smoking status: Never   Smokeless tobacco: Never  Vaping Use   Vaping Use: Never used  Substance and Sexual Activity   Alcohol use: No   Drug use: No   Sexual activity: Not Currently  Other Topics Concern   Not on file  Social History Narrative   Not on file   Social Determinants of Health   Financial Resource Strain: Low Risk  (12/18/2021)   Overall Financial Resource Strain (CARDIA)    Difficulty of Paying Living Expenses: Not hard at all  Food Insecurity: No Food Insecurity (12/18/2021)   Hunger Vital Sign    Worried About Running Out of Food in the Last Year: Never true    [place] in the Last Year: Never true  Transportation Needs: No Transportation Needs (12/18/2021)   PRAPARE - Hydrologist (Medical): No    Lack of Transportation (Non-Medical): No  Physical Activity: Inactive (12/18/2021)   Exercise Vital Sign    Days of Exercise per Week: 0 days    Minutes of Exercise per Session: 0 min  Stress: No Stress Concern Present (12/18/2021)   [place]    Feeling of Stress : Not at all  Social Connections: Not  on file  Intimate Partner Violence: Not At Risk (09/29/2018)   Humiliation, Afraid, Rape, and Kick questionnaire    Fear of Current or Ex-Partner: No    Emotionally Abused: No    Physically Abused: No    Sexually Abused: No    Family History:  Family History  Problem Relation Age of Onset   Dementia Mother    Cancer Father    Breast cancer Neg Hx    Sleep apnea Neg Hx     Medications:   Current Outpatient Medications on File Prior to Visit  Medication Sig Dispense Refill   amLODipine (NORVASC) 10 MG tablet Take 5 mg by mouth daily.      apixaban (ELIQUIS) 5 MG TABS tablet Take 1 tablet (5 mg total) by mouth 2 (two) times daily. 60 tablet 2   atorvastatin (LIPITOR) 40 MG tablet Take 1 tablet (40 mg total) by mouth daily. 30 tablet 2   furosemide (LASIX) 40 MG tablet Take 40 mg by mouth 2 (two) times daily.     losartan (COZAAR) 25 MG tablet Take 25 mg by mouth daily.     MAGNESIUM PO Take 1 tablet by mouth in the morning and at bedtime.     metoprolol (LOPRESSOR) 50 MG tablet Take 50 mg by mouth 2 (two) times daily.     multivitamin (RENA-VIT) TABS tablet Take 1 tablet by mouth daily.     mycophenolate (CELLCEPT) 250 MG capsule Take 250 mg by mouth 2 (two) times daily.     nystatin cream (MYCOSTATIN) Apply 1 application topically 2 (two) times daily. (Patient taking differently: Apply 1 Application topically 2 (two) times daily as needed (rash). When able to remember) 30 g 2   pantoprazole (PROTONIX) 40 MG tablet Take 40 mg by mouth daily.      potassium chloride SA (K-DUR,KLOR-CON) 20 MEQ tablet Take 20 mEq by mouth 2 (two) times daily.     tacrolimus (PROGRAF) 0.5 MG capsule Take 0.5 mg by mouth every evening. (Patient not taking: Reported on 08/05/2022)     tacrolimus (PROGRAF) 1 MG capsule Take 2 mg by mouth 2 (two)  times daily.     Vitamin D, Ergocalciferol, (DRISDOL) 1.25 MG (50000 UNIT) CAPS capsule Take 1 capsule (50,000 Units total) by mouth See admin instructions. Take  50,000 units by mouth twice a week on Thursday and Sunday 24 capsule 1   No current facility-administered medications on file prior to visit.    Allergies:   Allergies  Allergen Reactions   Vancomycin Hives, Itching, Rash and Other (See Comments)    Blistering, swelling, and peeling of feet.  Pt denies any similar reactions around face or neck.   Iohexol      Desc: iodine contrast/per dialysis center, Onset Date: [id number]       OBJECTIVE:  Physical Exam  There were no vitals filed for this visit.  There is no height or weight on file to calculate BMI. No results found.   General: well developed, well nourished, pleasant elderly African-American female, seated, in no evident distress Head: head normocephalic and atraumatic.   Neck: supple with no carotid or supraclavicular bruits Cardiovascular: irregular rate and rhythm, no murmurs Musculoskeletal: no deformity Skin:  no rash/petichiae Vascular:  Normal pulses all extremities   Neurologic Exam Mental Status: Awake and fully alert.  Fluent speech and language. Oriented to place and time. Recent memory subjectively impaired and remote memory intact. Attention span and concentration impaired with repeating questions and needing repeat clarification and fund of knowledge appropriate. Mood and affect appropriate.    03/11/2022    9:45 AM  MMSE - Mini Mental State Exam  Orientation to time 5  Orientation to Place 5  Registration 3  Attention/ Calculation 2  Recall 2  Language- name 2 objects 2  Language- repeat 1  Language- follow 3 step command 3  Language- read & follow direction 1  Write a sentence 1  Copy design 1  Total score 26   Cranial Nerves: Pupils equal, briskly reactive to light. Extraocular movements full without nystagmus. Visual fields full to confrontation. Hearing intact. Facial sensation intact. Face, tongue, palate moves normally and symmetrically.  Motor: Normal bulk and tone. Normal strength in all  tested extremity muscles. Sensory.: intact to touch , pinprick , position and vibratory sensation.  Coordination: Rapid alternating movements normal in all extremities except slightly decreased left hand. Finger-to-nose and heel-to-shin performed accurately bilaterally. Mild action tremor right hand (chronic) Gait and Station: Arises from chair without difficulty. Stance is normal. Gait demonstrates normal stride length and balance with use of cane Reflexes: 1+ and symmetric. Toes downgoing.        ASSESSMENT/PLAN: Irlene Crudup is a 77 y.o. year old female with history of several small right MCA infarcts in setting of right ICA terminus thrombus s/p TICI 3 revascularization on 01/07/2020 secondary to known AF missing dosages of Xarelto.  Transitioned to Eliquis at that time.  2 episodes of aphasia 02/07/2022 likely in setting of TIA with missing doses of Eliquis although unable to completely rule out seizure activity. Vascular risk factors include HTN, HLD, A. Fib, obesity and diagnosis of mild OSA 03/2020 not on CPAP     Likely TIA Right MCA infarcts:  Residual deficits: Short-term memory complaints with baseline memory impairment prior to stroke likely mild cognitive impairment.  MMSE today 26/30.  Again, discussed importance of routine memory exercises as well as continued compensation strategies. Continue  Eliquis 5 mg twice daily   and atorvastatin 40 mg daily for secondary stroke prevention PCP/cardiology.  Discussed importance of compliance with twice daily dosing regimen and importance of family  closely monitoring Discussed secondary stroke prevention and importance of close PCP follow-up for management of stroke risk factors for secondary stroke prevention including HTN with BP goal<130/90 and HLD with LDL goal<40 Stroke labs 01/2022: LDL 59, A1c 5.7   Obstructive sleep apnea Baseline sleep study 03/31/2020 by Dr. Rexene Gilbert which showed mild OSA Declins CPAP therapy     Follow-up  in 6 months or call earlier if needed   CC:  Sarah Brine, FNP    I spent 46 minutes of face-to-face and non-face-to-face time with patient and Sarah Gilbert.  This included previsit chart review, lab review, study review, electronic health record documentation, patient and Sarah Gilbert education and discussion regarding recent likely TIA with prior stroke with residual deficits, short-term memory concerns and possible contributing factors and review of MMSE, secondary stroke prevention measures and aggressive stroke risk factor management, importance of obtaining CPAP machine and answered all other questions to patient's satisfaction  Frann Rider, AGNP-BC  [facility] [address] [place], [place] [address]  Phone [phone] Fax [phone] Note: This document was prepared with digital dictation and possible smart phrase technology. Any transcriptional errors that result from this process are unintentional.

## 2022-09-16 ENCOUNTER — Ambulatory Visit: Payer: Medicare Other | Admitting: Adult Health

## 2022-09-16 ENCOUNTER — Encounter: Payer: Self-pay | Admitting: Adult Health

## 2022-09-16 VITALS — BP 126/74 | HR 77 | Ht 63.0 in | Wt 185.0 lb

## 2022-09-16 DIAGNOSIS — Z8673 Personal history of transient ischemic attack (TIA), and cerebral infarction without residual deficits: Secondary | ICD-10-CM

## 2022-09-16 DIAGNOSIS — G459 Transient cerebral ischemic attack, unspecified: Secondary | ICD-10-CM | POA: Diagnosis not present

## 2022-09-16 DIAGNOSIS — G3184 Mild cognitive impairment, so stated: Secondary | ICD-10-CM

## 2022-09-16 NOTE — Patient Instructions (Addendum)
Highly recommend doing memory exercises such as word search, sudoku , cross word puzzles, etc to help exercise your brain. Also increasing physical activity and socialization can help with your memory.   Continue Eliquis (apixaban) daily  and atorvastatin for secondary stroke prevention  Continue to follow up with PCP regarding blood pressure and cholesterol management  Maintain strict control of hypertension with blood pressure goal below 130/90 and cholesterol with LDL cholesterol (bad cholesterol) goal below 70 mg/dL.   Signs of a Stroke? Follow the BEFAST method:  Balance Watch for a sudden loss of balance, trouble with coordination or vertigo Eyes Is there a sudden loss of vision in one or both eyes? Or double vision?  Face: Ask the person to smile. Does one side of the face droop or is it numb?  Arms: Ask the person to raise both arms. Does one arm drift downward? Is there weakness or numbness of a leg? Speech: Ask the person to repeat a simple phrase. Does the speech sound slurred/strange? Is the person confused ? Time: If you observe any of these signs, call 911.    Followup in the future with me in 6 months or call earlier if needed       Thank you for coming to see [name] at [facility]. I hope we have been able to provide you high quality care today.  You may receive a patient satisfaction survey over the next few weeks. We would appreciate your feedback and comments so that we may continue to improve ourselves and the health of our patients.    Management of Memory Problems  There are some general things you can do to help manage your memory problems.  Your memory may not in fact recover, but by using techniques and strategies you will be able to manage your memory difficulties better.  1)  Establish a routine. Try to establish and then stick to a regular routine.  By doing this, you will get used to what to expect and you will reduce the need to rely on  your memory.  Also, try to do things at the same time of day, such as taking your medication or checking your calendar first thing in the morning. Think about think that you can do as a part of a regular routine and make a list.  Then enter them into a daily planner to remind you.  This will help you establish a routine.  2)  Organize your environment. Organize your environment so that it is uncluttered.  Decrease visual stimulation.  Place everyday items such as keys or cell phone in the same place every day (ie.  Basket next to front door) Use post it notes with a brief message to yourself (ie. Turn off light, lock the door) Use labels to indicate where things go (ie. Which cupboards are for food, dishes, etc.) Keep a notepad and pen by the telephone to take messages  3)  Memory Aids A diary or journal/notebook/daily planner Making a list (shopping list, chore list, to do list that needs to be done) Using an alarm as a reminder (kitchen timer or cell phone alarm) Using cell phone to store information (Notes, Calendar, Reminders) Calendar/White board placed in a prominent position Post-it notes  In order for memory aids to be useful, you need to have good habits.  It's no good remembering to make a note in your journal if you don't remember to look in it.  Try setting aside a certain time  of day to look in journal.  4)  Improving mood and managing fatigue. There may be other factors that contribute to memory difficulties.  Factors, such as anxiety, depression and tiredness can affect memory. Regular gentle exercise can help improve your mood and give you more energy. Simple relaxation techniques may help relieve symptoms of anxiety Try to get back to completing activities or hobbies you enjoyed doing in the past. Learn to pace yourself through activities to decrease fatigue. Find out about some local support groups where you can share experiences with others. Try and achieve 7-8 hours of  sleep at night.

## 2022-11-11 ENCOUNTER — Other Ambulatory Visit: Payer: Self-pay

## 2022-11-11 DIAGNOSIS — E559 Vitamin D deficiency, unspecified: Secondary | ICD-10-CM

## 2022-11-11 MED ORDER — VITAMIN D (ERGOCALCIFEROL) 1.25 MG (50000 UNIT) PO CAPS: 50000.0000 [IU] | ORAL_CAPSULE | ORAL | 1 refills | Status: DC

## 2022-12-31 ENCOUNTER — Encounter: Payer: Self-pay | Admitting: Nurse Practitioner

## 2022-12-31 ENCOUNTER — Ambulatory Visit (INDEPENDENT_AMBULATORY_CARE_PROVIDER_SITE_OTHER): Payer: Medicare Other

## 2022-12-31 ENCOUNTER — Ambulatory Visit (INDEPENDENT_AMBULATORY_CARE_PROVIDER_SITE_OTHER): Payer: Medicare Other | Admitting: Nurse Practitioner

## 2022-12-31 VITALS — BP 126/66 | HR 75 | Temp 98.2°F | Ht 60.0 in | Wt 177.8 lb

## 2022-12-31 VITALS — BP 126/66 | HR 75 | Temp 98.2°F | Ht 60.0 in | Wt 177.0 lb

## 2022-12-31 DIAGNOSIS — Z23 Encounter for immunization: Secondary | ICD-10-CM | POA: Diagnosis not present

## 2022-12-31 DIAGNOSIS — E559 Vitamin D deficiency, unspecified: Secondary | ICD-10-CM | POA: Insufficient documentation

## 2022-12-31 DIAGNOSIS — Z0000 Encounter for general adult medical examination without abnormal findings: Secondary | ICD-10-CM | POA: Diagnosis not present

## 2022-12-31 DIAGNOSIS — I739 Peripheral vascular disease, unspecified: Secondary | ICD-10-CM | POA: Diagnosis not present

## 2022-12-31 DIAGNOSIS — I119 Hypertensive heart disease without heart failure: Secondary | ICD-10-CM | POA: Diagnosis not present

## 2022-12-31 DIAGNOSIS — I482 Chronic atrial fibrillation, unspecified: Secondary | ICD-10-CM | POA: Diagnosis not present

## 2022-12-31 DIAGNOSIS — G3184 Mild cognitive impairment, so stated: Secondary | ICD-10-CM

## 2022-12-31 DIAGNOSIS — R7309 Other abnormal glucose: Secondary | ICD-10-CM

## 2022-12-31 DIAGNOSIS — E782 Mixed hyperlipidemia: Secondary | ICD-10-CM | POA: Diagnosis not present

## 2022-12-31 NOTE — Patient Instructions (Signed)
Sarah Gilbert , Thank you for taking time to come for your Medicare Wellness Visit. I appreciate your ongoing commitment to your health goals. Please review the following plan we discussed and let me know if I can assist you in the future.   These are the goals we discussed:  Goals       Patient Stated (pt-stated)      Wants to maintain good health      Patient Stated      12/04/2020, wants to weigh 145-150      Patient Stated      12/18/2021, wants to lose more weight      Patient Stated      12/31/2022, wants to lose weight      Weight (lb) < 200 lb (90.7 kg) (pt-stated)      Weight (lb) < 200 lb (90.7 kg)      11/29/2019, wants to weigh 175-200 pounds        This is a list of the screening recommended for you and due dates:  Health Maintenance  Topic Date Due   COVID-19 Vaccine (6 - 2023-24 season) 04/17/2022   Colon Cancer Screening  12/31/2023*   Flu Shot  03/18/2023   Mammogram  12/26/2023   Medicare Annual Wellness Visit  12/31/2023   DTaP/Tdap/Td vaccine (2 - Td or Tdap) 12/30/2032   Pneumonia Vaccine  Completed   DEXA scan (bone density measurement)  Completed   Hepatitis C Screening: USPSTF Recommendation to screen - Ages 58-79 yo.  Completed   Zoster (Shingles) Vaccine  Completed   HPV Vaccine  Aged Out  *Topic was postponed. The date shown is not the original due date.    Advanced directives: Advance directive discussed with you today. Even though you declined this today please call our office should you change your mind and we can give you the proper paperwork for you to fill out.  Conditions/risks identified: none  Next appointment: Follow up in one year for your annual wellness visit    Preventive Care 65 Years and Older, Female Preventive care refers to lifestyle choices and visits with your health care provider that can promote health and wellness. What does preventive care include? A yearly physical exam. This is also called an annual well check. Dental  exams once or twice a year. Routine eye exams. Ask your health care provider how often you should have your eyes checked. Personal lifestyle choices, including: Daily care of your teeth and gums. Regular physical activity. Eating a healthy diet. Avoiding tobacco and drug use. Limiting alcohol use. Practicing safe sex. Taking low-dose aspirin every day. Taking vitamin and mineral supplements as recommended by your health care provider. What happens during an annual well check? The services and screenings done by your health care provider during your annual well check will depend on your age, overall health, lifestyle risk factors, and family history of disease. Counseling  Your health care provider may ask you questions about your: Alcohol use. Tobacco use. Drug use. Emotional well-being. Home and relationship well-being. Sexual activity. Eating habits. History of falls. Memory and ability to understand (cognition). Work and work Astronomer. Reproductive health. Screening  You may have the following tests or measurements: Height, weight, and BMI. Blood pressure. Lipid and cholesterol levels. These may be checked every 5 years, or more frequently if you are over 53 years old. Skin check. Lung cancer screening. You may have this screening every year starting at age 53 if you have a 30-pack-year history  of smoking and currently smoke or have quit within the past 15 years. Fecal occult blood test (FOBT) of the stool. You may have this test every year starting at age 60. Flexible sigmoidoscopy or colonoscopy. You may have a sigmoidoscopy every 5 years or a colonoscopy every 10 years starting at age 29. Hepatitis C blood test. Hepatitis B blood test. Sexually transmitted disease (STD) testing. Diabetes screening. This is done by checking your blood sugar (glucose) after you have not eaten for a while (fasting). You may have this done every 1-3 years. Bone density scan. This is done  to screen for osteoporosis. You may have this done starting at age 53. Mammogram. This may be done every 1-2 years. Talk to your health care provider about how often you should have regular mammograms. Talk with your health care provider about your test results, treatment options, and if necessary, the need for more tests. Vaccines  Your health care provider may recommend certain vaccines, such as: Influenza vaccine. This is recommended every year. Tetanus, diphtheria, and acellular pertussis (Tdap, Td) vaccine. You may need a Td booster every 10 years. Zoster vaccine. You may need this after age 60. Pneumococcal 13-valent conjugate (PCV13) vaccine. One dose is recommended after age 39. Pneumococcal polysaccharide (PPSV23) vaccine. One dose is recommended after age 83. Talk to your health care provider about which screenings and vaccines you need and how often you need them. This information is not intended to replace advice given to you by your health care provider. Make sure you discuss any questions you have with your health care provider. Document Released: 08/30/2015 Document Revised: 04/22/2016 Document Reviewed: 06/04/2015 Elsevier Interactive Patient Education  2017 [employer] [address] Prevention in the Home Falls can cause injuries. They can happen to people of all ages. There are many things you can do to make your home safe and to help prevent falls. What can I do on the outside of my home? Regularly fix the edges of walkways and driveways and fix any cracks. Remove anything that might make you trip as you walk through a door, such as a raised step or threshold. Trim any bushes or trees on the path to your home. Use bright outdoor lighting. Clear any walking paths of anything that might make someone trip, such as rocks or tools. Regularly check to see if handrails are loose or broken. Make sure that both sides of any steps have handrails. Any raised decks and porches should have  guardrails on the edges. Have any leaves, snow, or ice cleared regularly. Use sand or salt on walking paths during winter. Clean up any spills in your garage right away. This includes oil or grease spills. What can I do in the bathroom? Use night lights. Install grab bars by the toilet and in the tub and shower. Do not use towel bars as grab bars. Use non-skid mats or decals in the tub or shower. If you need to sit down in the shower, use a plastic, non-slip stool. Keep the floor dry. Clean up any water that spills on the floor as soon as it happens. Remove soap buildup in the tub or shower regularly. Attach bath mats securely with double-sided non-slip rug tape. Do not have throw rugs and other things on the floor that can make you trip. What can I do in the bedroom? Use night lights. Make sure that you have a light by your bed that is easy to reach. Do not use any sheets or blankets  that are too big for your bed. They should not hang down onto the floor. Have a firm chair that has side arms. You can use this for support while you get dressed. Do not have throw rugs and other things on the floor that can make you trip. What can I do in the kitchen? Clean up any spills right away. Avoid walking on wet floors. Keep items that you use a lot in easy-to-reach places. If you need to reach something above you, use a strong step stool that has a grab bar. Keep electrical cords out of the way. Do not use floor polish or wax that makes floors slippery. If you must use wax, use non-skid floor wax. Do not have throw rugs and other things on the floor that can make you trip. What can I do with my stairs? Do not leave any items on the stairs. Make sure that there are handrails on both sides of the stairs and use them. Fix handrails that are broken or loose. Make sure that handrails are as long as the stairways. Check any carpeting to make sure that it is firmly attached to the stairs. Fix any carpet  that is loose or worn. Avoid having throw rugs at the top or bottom of the stairs. If you do have throw rugs, attach them to the floor with carpet tape. Make sure that you have a light switch at the top of the stairs and the bottom of the stairs. If you do not have them, ask someone to add them for you. What else can I do to help prevent falls? Wear shoes that: Do not have high heels. Have rubber bottoms. Are comfortable and fit you well. Are closed at the toe. Do not wear sandals. If you use a stepladder: Make sure that it is fully opened. Do not climb a closed stepladder. Make sure that both sides of the stepladder are locked into place. Ask someone to hold it for you, if possible. Clearly mark and make sure that you can see: Any grab bars or handrails. First and last steps. Where the edge of each step is. Use tools that help you move around (mobility aids) if they are needed. These include: Canes. Walkers. Scooters. Crutches. Turn on the lights when you go into a dark area. Replace any light bulbs as soon as they burn out. Set up your furniture so you have a clear path. Avoid moving your furniture around. If any of your floors are uneven, fix them. If there are any pets around you, be aware of where they are. Review your medicines with your doctor. Some medicines can make you feel dizzy. This can increase your chance of falling. Ask your doctor what other things that you can do to help prevent falls. This information is not intended to replace advice given to you by your health care provider. Make sure you discuss any questions you have with your health care provider. Document Released: 05/30/2009 Document Revised: 01/09/2016 Document Reviewed: 09/07/2014 Elsevier Interactive Patient Education  2017 [employer].

## 2022-12-31 NOTE — Progress Notes (Signed)
TDAP administered today. Was billed through [facility] with patient's consent  Subjective:   Sarah Gilbert is a 77 y.o. female who presents for Medicare Annual (Subsequent) preventive examination.  Review of Systems     Cardiac Risk Factors include: advanced age (>12men, >75 women);dyslipidemia;hypertension;obesity (BMI >30kg/m2)     Objective:    Today's Vitals   12/31/22 0825  BP: 126/66  Pulse: 75  Temp: 98.2 F (36.8 C)  TempSrc: Oral  SpO2: 99%  Weight: 177 lb 12.8 oz (80.6 kg)  Height: 5' (1.524 m)   Body mass index is 34.72 kg/m.     12/31/2022    8:37 AM 08/03/2022    9:54 PM 02/07/2022   11:10 PM 12/18/2021    8:45 AM 07/02/2021    9:40 AM 12/04/2020    9:49 AM 01/07/2020    1:23 PM  Advanced Directives  Does Patient Have a Medical Advance Directive? No No No No No No No  Would patient like information on creating a medical advance directive? No - Patient declined  No - Patient declined No - Patient declined No - Patient declined No - Patient declined No - Patient declined    Current Medications (verified) Outpatient Encounter Medications as of 12/31/2022  Medication Sig   amLODipine (NORVASC) 10 MG tablet Take 5 mg by mouth daily.    apixaban (ELIQUIS) 5 MG TABS tablet Take 1 tablet (5 mg total) by mouth 2 (two) times daily.   atorvastatin (LIPITOR) 40 MG tablet Take 1 tablet (40 mg total) by mouth daily.   furosemide (LASIX) 40 MG tablet Take 40 mg by mouth 2 (two) times daily.   losartan (COZAAR) 25 MG tablet Take 25 mg by mouth daily.   MAGNESIUM PO Take 1 tablet by mouth in the morning and at bedtime.   metoprolol (LOPRESSOR) 50 MG tablet Take 50 mg by mouth 2 (two) times daily.   multivitamin (RENA-VIT) TABS tablet Take 1 tablet by mouth daily.   mycophenolate (CELLCEPT) 250 MG capsule Take 250 mg by mouth 2 (two) times daily.   nystatin cream (MYCOSTATIN) Apply 1 application topically 2 (two) times daily. (Patient taking differently: Apply 1  Application topically 2 (two) times daily as needed (rash). When able to remember)   pantoprazole (PROTONIX) 40 MG tablet Take 40 mg by mouth daily.    potassium chloride SA (K-DUR,KLOR-CON) 20 MEQ tablet Take 20 mEq by mouth 2 (two) times daily.   tacrolimus (PROGRAF) 0.5 MG capsule Take 0.5 mg by mouth every evening.   tacrolimus (PROGRAF) 1 MG capsule Take 2 mg by mouth 2 (two) times daily.   Vitamin D, Ergocalciferol, (DRISDOL) 1.25 MG (50000 UNIT) CAPS capsule Take 1 capsule (50,000 Units total) by mouth See admin instructions. Take 50,000 units by mouth twice a week on Thursday and Sunday   [DISCONTINUED] Vitamin D, Ergocalciferol, (DRISDOL) 1.25 MG (50000 UNIT) CAPS capsule Take 1 capsule (50,000 Units total) by mouth See admin instructions. Take 50,000 units by mouth twice a week on Thursday and Sunday   No facility-administered encounter medications on file as of 12/31/2022.    Allergies (verified) Vancomycin and Iohexol   History: Past Medical History:  Diagnosis Date   Anemia    Arthritis    Atrial fibrillation (HCC)    Bleeding nose    history of    ESRD (end stage renal disease) (HCC)    History of bronchitis    Hyperlipemia    Hypertension    Joint inflammation  Obesity    PONV (postoperative nausea and vomiting)    Rotator cuff tear arthropathy of right shoulder 06/18/2015   Thyroid disease    Wears glasses    Past Surgical History:  Procedure Laterality Date   ABDOMINAL HYSTERECTOMY     APPENDECTOMY     CARDIAC CATHETERIZATION N/A 04/18/2015   Procedure: Left Heart Cath and Coronary Angiography;  Surgeon: Rinaldo Cloud, MD;  Location[facility] INVASIVE CV LAB;  Service: Cardiovascular;  Laterality: N/A;   CHOLECYSTECTOMY     COLONOSCOPY     IR CT HEAD LTD  01/07/2020   IR PERCUTANEOUS ART THROMBECTOMY/INFUSION INTRACRANIAL INC DIAG ANGIO  01/07/2020   IR US GUIDE VASC ACCESS RIGHT  01/07/2020   KIDNEY TRANSPLANT     june 2011-[facility]   RADIOLOGY WITH ANESTHESIA N/A  01/07/2020   Procedure: IR WITH ANESTHESIA;  Surgeon: Radiologist, Medication, MD;  Location: [facility] OR;  Service: Radiology;  Laterality: N/A;   TONSILLECTOMY     TOTAL SHOULDER ARTHROPLASTY Right 06/18/2015   Procedure: RIGHT TOTAL SHOULDER ARTHROPLASTY;  Surgeon: Teryl Lucy, MD;  Location: [facility] OR;  Service: Orthopedics;  Laterality: Right;   Family History  Problem Relation Age of Onset   Dementia Mother    Cancer Father    Breast cancer Neg Hx    Sleep apnea Neg Hx    Social History   Socioeconomic History   Marital status: Single    Spouse name: Not on file   Number of children: Not on file   Years of education: Not on file   Highest education level: Not on file  Occupational History   Occupation: retired  Tobacco Use   Smoking status: Never   Smokeless tobacco: Never  Vaping Use   Vaping Use: Never used  Substance and Sexual Activity   Alcohol use: No   Drug use: No   Sexual activity: Not Currently  Other Topics Concern   Not on file  Social History Narrative   Not on file   Social Determinants of Health   Financial Resource Strain: Low Risk  (12/31/2022)   Overall Financial Resource Strain (CARDIA)    Difficulty of Paying Living Expenses: Not hard at all  Food Insecurity: No Food Insecurity (12/31/2022)   Hunger Vital Sign    Worried About Running Out of Food in the Last Year: Never true    Ran Out of Food in the Last Year: Never true  Transportation Needs: No Transportation Needs (12/31/2022)   PRAPARE - Administrator, Civil Service (Medical): No    Lack of Transportation (Non-Medical): No  Physical Activity: Inactive (12/31/2022)   Exercise Vital Sign    Days of Exercise per Week: 0 days    Minutes of Exercise per Session: 0 min  Stress: No Stress Concern Present (12/31/2022)   [employer] of Occupational Health - Occupational Stress Questionnaire    Feeling of Stress : Not at all  Social Connections: Not on file    Tobacco  Counseling Counseling given: Not Answered   Clinical Intake:  Pre-visit preparation completed: Yes  Pain : No/denies pain     Nutritional Status: BMI > 30  Obese Nutritional Risks: None Diabetes: No  How often do you need to have someone help you when you read instructions, pamphlets, or other written materials from your doctor or pharmacy?: 1 - Never  Diabetic? no  Interpreter Needed?: No  Information entered by :: NAllen LPN   Activities of Daily Living    12/31/2022  8:38 AM 02/07/2022   11:10 PM  In your present state of health, do you have any difficulty performing the following activities:  Hearing? 0 0  Vision? 0 0  Difficulty concentrating or making decisions? 1 0  Comment memory since stroke   Walking or climbing stairs? 0 0  Dressing or bathing? 0 0  Doing errands, shopping? 0 0  Preparing Food and eating ? N   Using the Toilet? N   In the past six months, have you accidently leaked urine? N   Do you have problems with loss of bowel control? N   Managing your Medications? N   Managing your Finances? N   Housekeeping or managing your Housekeeping? N     Patient Care Team: [name], FNP as PCP - General (General Practice)  Indicate any recent Medical Services you may have received from other than [facility] providers in the past year (date may be approximate).     Assessment:   This is a routine wellness examination for Julitza.  Hearing/Vision screen Vision Screening - Comments:: No regular eye exams,  Dietary issues and exercise activities discussed: Current Exercise Habits: The patient does not participate in regular exercise at present   Goals Addressed             This Visit's Progress    Patient Stated       12/31/2022, wants to lose weight       Depression Screen    12/31/2022    8:43 AM 12/31/2022    8:37 AM 06/23/2022    9:23 AM 12/18/2021    8:47 AM 12/04/2020    9:51 AM 02/13/2020    9:44 AM 11/29/2019    9:22 AM  PHQ 2/9  Scores  PHQ - 2 Score 0 0 0 0 0 0 0    Fall Risk    12/31/2022    8:43 AM 12/31/2022    8:37 AM 06/23/2022    9:23 AM 12/18/2021    8:46 AM 12/04/2020    9:50 AM  Fall Risk   Falls in the past year? 0 0 0 0 0  Number falls in past yr: 0 0 0 0   Injury with Fall? 0 0 0 0   Risk for fall due to : No Fall Risks Medication side effect No Fall Risks Impaired balance/gait;Impaired mobility;Medication side effect Impaired balance/gait;Impaired mobility;Medication side effect  Follow up Falls evaluation completed Falls prevention discussed;Education provided;Falls evaluation completed Falls evaluation completed Falls evaluation completed;Education provided;Falls prevention discussed Falls evaluation completed;Education provided;Falls prevention discussed    FALL RISK PREVENTION PERTAINING TO THE HOME:  Any stairs in or around the home? Yes  If so, are there any without handrails?  ramp Home free of loose throw rugs in walkways, pet beds, electrical cords, etc? Yes  Adequate lighting in your home to reduce risk of falls? Yes   ASSISTIVE DEVICES UTILIZED TO PREVENT FALLS:  Life alert? No  Use of a cane, walker or w/c? Yes  Grab bars in the bathroom? No  Shower chair or bench in shower? Yes  Elevated toilet seat or a handicapped toilet? Yes   TIMED UP AND GO:  Was the test performed? Yes .  Length of time to ambulate 10 feet: 7 sec.   Gait slow and steady with assistive device  Cognitive Function:    09/16/2022    8:39 AM 03/11/2022    9:45 AM  MMSE - Mini Mental State Exam  Orientation to time 5  5  Orientation to Place 4 5  Registration 3 3  Attention/ Calculation 1 2  Recall 1 2  Language- name 2 objects 2 2  Language- repeat 1 1  Language- follow 3 step command 3 3  Language- read & follow direction 1 1  Write a sentence 1 1  Copy design 0 1  Total score 22 26        12/31/2022    8:39 AM 12/18/2021    8:48 AM 12/04/2020    9:52 AM 04/03/2020    9:09 AM 11/29/2019    9:24  AM  6CIT Screen  What Year? 0 points 0 points 0 points 0 points 0 points  What month? 0 points 0 points 0 points 0 points 0 points  What time? 0 points 0 points 0 points 0 points 0 points  Count back from 20 0 points 0 points 0 points 0 points 0 points  Months in reverse 2 points 0 points 0 points 0 points 0 points  Repeat phrase 6 points 6 points 6 points 0 points 4 points  Total Score 8 points 6 points 6 points 0 points 4 points    Immunizations Immunization History  Administered Date(s) Administered   Fluad Quad(high Dose 65+) 05/22/2021, 06/23/2022   Influenza,inj,Quad PF,6+ Mos 06/20/2015   Influenza-Unspecified 05/27/2020   PFIZER Comirnaty(Gray Top)Covid-19 Tri-Sucrose Vaccine 11/20/2020   PFIZER(Purple Top)SARS-COV-2 Vaccination 10/13/2019, 11/07/2019, 05/25/2020   PNEUMOCOCCAL CONJUGATE-20 07/21/2022   Pfizer Covid-19 Vaccine Bivalent Booster 33yrs & up 06/13/2021   Pneumococcal Polysaccharide-23 07/09/2021   Tdap 12/31/2022   Zoster Recombinat (Shingrix) 12/04/2020, 08/04/2021    TDAP status: Completed at today's visit  Flu Vaccine status: Up to date  Pneumococcal vaccine status: Up to date  Covid-19 vaccine status: Completed vaccines  Qualifies for Shingles Vaccine? Yes   Zostavax completed No   Shingrix Completed?: Yes  Screening Tests Health Maintenance  Topic Date Due   COVID-19 Vaccine (6 - 2023-24 season) 04/17/2022   Medicare Annual Wellness (AWV)  12/19/2022   COLONOSCOPY (Pts 45-18yrs Insurance coverage will need to be confirmed)  12/31/2023 (Originally 09/09/2021)   INFLUENZA VACCINE  03/18/2023   MAMMOGRAM  12/26/2023   DTaP/Tdap/Td (2 - Td or Tdap) 12/30/2032   Pneumonia Vaccine 75+ Years old  Completed   DEXA SCAN  Completed   Hepatitis C Screening  Completed   Zoster Vaccines- Shingrix  Completed   HPV VACCINES  Aged Out    Health Maintenance  Health Maintenance Due  Topic Date Due   COVID-19 Vaccine (6 - 2023-24 season) 04/17/2022    Medicare Annual Wellness (AWV)  12/19/2022    Colorectal cancer screening: decline  Mammogram status: Completed 12/25/2021. Repeat every year  Bone Density status: Completed 11/01/2017.   Lung Cancer Screening: (Low Dose CT Chest recommended if Age 44-80 years, 30 pack-year currently smoking OR have quit w/in 15years.) does not qualify.   Lung Cancer Screening Referral: no  Additional Screening:  Hepatitis C Screening: does qualify; Completed 09/29/2018  Vision Screening: Recommended annual ophthalmology exams for early detection of glaucoma and other disorders of the eye. Is the patient up to date with their annual eye exam?  No  Who is the provider or what is the name of the office in which the patient attends annual eye exams? none If pt is not established with a provider, would they like to be referred to a provider to establish care? No .   Dental Screening: Recommended annual dental exams for proper oral  hygiene  Community Resource Referral / Chronic Care Management: CRR required this visit?  No   CCM required this visit?  No      Plan:     I have personally reviewed and noted the following in the patient's chart:   Medical and social history Use of alcohol, tobacco or illicit drugs  Current medications and supplements including opioid prescriptions. Patient is not currently taking opioid prescriptions. Functional ability and status Nutritional status Physical activity Advanced directives List of other physicians Hospitalizations, surgeries, and ER visits in previous 12 months Vitals Screenings to include cognitive, depression, and falls Referrals and appointments  In addition, I have reviewed and discussed with patient certain preventive protocols, quality metrics, and best practice recommendations. A written personalized care plan for preventive services as well as general preventive health recommendations were provided to patient.     Barb Merino,  LPN   11/23/8117   Nurse Notes: none

## 2022-12-31 NOTE — Progress Notes (Signed)
Sarah Gilbert,acting as a Neurosurgeon for Sarah Felts, FNP.,have documented all relevant documentation on the behalf of Sarah Felts, FNP,as directed by  Sarah Felts, FNP while in the presence of Sarah Felts, FNP.    Subjective:     Patient ID: Sarah Gilbert , female    DOB: Mar 18, 1946 , 77 y.o.   MRN: [id number]   Chief Complaint  Patient presents with   Hyperlipidemia   Hypertension    HPI  Patient presents today for Chol and BP check, patient reports compliance with medications and has no other concerns today. She can not have a colonoscopy due to not being able to put her to sleep.   BP Readings from Last 3 Encounters: 12/31/22 : 126/66 12/31/22 : 126/66 09/16/22 : 126/74    Hyperlipidemia This is a chronic problem. The current episode started more than 1 year ago. The problem is controlled. She has no history of chronic renal disease. Pertinent negatives include no chest pain. Compliance problems include adherence to exercise.  Risk factors for coronary artery disease include obesity, dyslipidemia, hypertension and a sedentary lifestyle.  Hypertension This is a chronic problem. The current episode started more than 1 year ago. The problem has been gradually improving since onset. The problem is controlled. Pertinent negatives include no chest pain, headaches or palpitations. Risk factors for coronary artery disease include obesity and sedentary lifestyle. Past treatments include calcium channel blockers, angiotensin blockers and beta blockers. There is no history of angina. There is no history of chronic renal disease.  Diabetes She presents for her follow-up diabetic visit. Diabetes type: prediabetes. There are no hypoglycemic associated symptoms. Pertinent negatives for hypoglycemia include no dizziness or headaches. There are no diabetic associated symptoms. Pertinent negatives for diabetes include no chest pain, no fatigue, no polydipsia, no polyphagia and no polyuria.  There are no hypoglycemic complications. There are no diabetic complications. Risk factors for coronary artery disease include obesity and sedentary lifestyle. When asked about current treatments, none were reported. She is following a generally healthy diet. She has not had a previous visit with a dietitian. She rarely participates in exercise. She does not see a podiatrist.Eye exam is not current.     Past Medical History:  Diagnosis Date   Anemia    Arthritis    Atrial fibrillation (HCC)    Bleeding nose    history of    ESRD (end stage renal disease) (HCC)    History of bronchitis    Hyperlipemia    Hypertension    Joint inflammation    Obesity    PONV (postoperative nausea and vomiting)    Rotator cuff tear arthropathy of right shoulder 06/18/2015   Thyroid disease    Wears glasses      Family History  Problem Relation Age of Onset   Dementia Mother    Cancer Father    Breast cancer Neg Hx    Sleep apnea Neg Hx      Current Outpatient Medications:    amLODipine (NORVASC) 10 MG tablet, Take 5 mg by mouth daily. , Disp: , Rfl:    apixaban (ELIQUIS) 5 MG TABS tablet, Take 1 tablet (5 mg total) by mouth 2 (two) times daily., Disp: 60 tablet, Rfl: 2   atorvastatin (LIPITOR) 40 MG tablet, Take 1 tablet (40 mg total) by mouth daily., Disp: 30 tablet, Rfl: 2   furosemide (LASIX) 40 MG tablet, Take 40 mg by mouth 2 (two) times daily., Disp: , Rfl:    losartan (  COZAAR) 25 MG tablet, Take 25 mg by mouth daily., Disp: , Rfl:    MAGNESIUM PO, Take 1 tablet by mouth in the morning and at bedtime., Disp: , Rfl:    metoprolol (LOPRESSOR) 50 MG tablet, Take 50 mg by mouth 2 (two) times daily., Disp: , Rfl:    multivitamin (RENA-VIT) TABS tablet, Take 1 tablet by mouth daily., Disp: , Rfl:    mycophenolate (CELLCEPT) 250 MG capsule, Take 250 mg by mouth 2 (two) times daily., Disp: , Rfl:    nystatin cream (MYCOSTATIN), Apply 1 application topically 2 (two) times daily. (Patient taking  differently: Apply 1 Application topically 2 (two) times daily as needed (rash). When able to remember), Disp: 30 g, Rfl: 2   pantoprazole (PROTONIX) 40 MG tablet, Take 40 mg by mouth daily. , Disp: , Rfl:    potassium chloride SA (K-DUR,KLOR-CON) 20 MEQ tablet, Take 20 mEq by mouth 2 (two) times daily., Disp: , Rfl:    tacrolimus (PROGRAF) 0.5 MG capsule, Take 0.5 mg by mouth every evening., Disp: , Rfl:    tacrolimus (PROGRAF) 1 MG capsule, Take 2 mg by mouth 2 (two) times daily., Disp: , Rfl:    Vitamin D, Ergocalciferol, (DRISDOL) 1.25 MG (50000 UNIT) CAPS capsule, Take 1 capsule (50,000 Units total) by mouth See admin instructions. Take 50,000 units by mouth twice a week on Thursday and Sunday, Disp: 24 capsule, Rfl: 1   Allergies  Allergen Reactions   Vancomycin Hives, Itching, Rash and Other (See Comments)    Blistering, swelling, and peeling of feet.  Pt denies any similar reactions around face or neck.   Iohexol      Desc: iodine contrast/per dialysis center, Onset Date: [id number]      Review of Systems  Constitutional:  Negative for fatigue.  Respiratory: Negative.    Cardiovascular:  Negative for chest pain, palpitations and leg swelling.  Endocrine: Negative for polydipsia, polyphagia and polyuria.  Neurological:  Negative for dizziness and headaches.  Hematological: Negative.   Psychiatric/Behavioral: Negative.       Today's Vitals   12/31/22 0843  BP: 126/66  Pulse: 75  Temp: 98.2 F (36.8 C)  TempSrc: Oral  Weight: 177 lb (80.3 kg)  Height: 5' (1.524 m)  PainSc: 0-No pain   Body mass index is 34.57 kg/m.  Wt Readings from Last 3 Encounters:  12/31/22 177 lb (80.3 kg)  12/31/22 177 lb 12.8 oz (80.6 kg)  09/16/22 185 lb (83.9 kg)    The ASCVD Risk score (Arnett DK, et al., 2019) failed to calculate for the following reasons:   The patient has a prior MI or stroke diagnosis  Objective:  Physical Exam Vitals reviewed.  Constitutional:      General: She is  not in acute distress.    Appearance: Normal appearance. She is obese.  Cardiovascular:     Rate and Rhythm: Normal rate and regular rhythm.     Pulses: Normal pulses.     Heart sounds: Normal heart sounds. No murmur heard. Pulmonary:     Effort: Pulmonary effort is normal. No respiratory distress.     Breath sounds: Normal breath sounds. No wheezing.  Musculoskeletal:     Right lower leg: No edema.     Left lower leg: No edema.  Skin:    General: Skin is warm and dry.  Neurological:     General: No focal deficit present.     Mental Status: She is alert and oriented to person, place, and  time.     Cranial Nerves: No cranial nerve deficit.     Motor: No weakness.  Psychiatric:        Mood and Affect: Mood normal.        Behavior: Behavior normal.        Thought Content: Thought content normal.        Judgment: Judgment normal.         Assessment And Plan:     1. Benign hypertensive heart disease without heart failure Comments: Blood pressure is well-controlled.  Continue current medications  2. Mixed hyperlipidemia Comments: Cholesterol levels are stable.  Continue current medication  3. Abnormal glucose Comments: Hemoglobin A1c has been controlled.  continue focusing on diet low in sugar and starches  4. Vitamin D deficiency  5. Chronic atrial fibrillation (HCC) Comments: Continue current medications.  6. PAD (peripheral artery disease) (HCC) Comments: Continue current medications.  Denies any issues at this time.  7. Mild cognitive impairment with memory loss Comments: She has her uncle with her during her visits to help her to remember.  She is not interested in any medications at this time - Vitamin B12 - VITAMIN D 25 Hydroxy (Vit-D Deficiency, Fractures) - TSH - RPR    Return for 6 month chol check.   Patient was given opportunity to ask questions. Patient verbalized understanding of the plan and was able to repeat key elements of the plan. All questions  were answered to their satisfaction.  Sarah Felts, FNP   I, Sarah Felts, FNP, have reviewed all documentation for this visit. The documentation on 12/31/22 for the exam, diagnosis, procedures, and orders are all accurate and complete.   IF YOU HAVE BEEN REFERRED TO A SPECIALIST, IT MAY TAKE 1-2 WEEKS TO SCHEDULE/PROCESS THE REFERRAL. IF YOU HAVE NOT HEARD FROM US/SPECIALIST IN TWO WEEKS, PLEASE GIVE [name] A CALL AT [phone].   THE PATIENT IS ENCOURAGED TO PRACTICE SOCIAL DISTANCING DUE TO THE COVID-19 PANDEMIC.

## 2023-03-15 NOTE — Progress Notes (Unsigned)
[facility] [address] [place]. Sarah Gilbert [address] [phone]       STROKE FOLLOW UP NOTE  [name] Date of Birth:  Apr 28, 1946 Medical Record Number:  [id number]   Reason for Referral: stroke follow up    SUBJECTIVE:   CHIEF COMPLAINT:  No chief complaint on file.    HPI:    Update 03/17/2023 JM: Returns for follow-up visit accompanied by her uncle.  Has been stable without new stroke/TIA symptoms.  Cognition stable since prior visit.  Continues to live with her uncle, maintains ADLs independently, uncle assist with IADLs.  Compliant on Eliquis and atorvastatin.  Routinely follows with PCP for stroke risk factor management.     History provided for reference purposes only Update 09/16/2022 JM: Patient returns for 62-month stroke follow-up.  Overall stable without new stroke/TIA symptoms.  Residual cognitive impairment about the same since prior visit per patient and uncle.  MMSE today 22/30 (prior 26/30 maintains ADLs independently, lives with uncle who assists with IADLs.  She does like to read and write but does not do any other type of memory exercises nor any routine physical activity.  Compliant with Eliquis and atorvastatin Blood pressure well-controlled She was seen by Dr. Frances Furbish on 04/21/2022 for OSA as she was interested in pursuing CPAP but at that visit, declined interest in CPAP therapy.   Update 03/10/2022 JM: Patient is being seen today for hospital follow-up accompanied by her uncle.  She presented to [facility] ED on 6/24 with 2 episodes of aphasia lasting 15 to 20 minutes which resolved without intervention.  MRI negative for acute stroke.  Etiology of symptoms likely TIA in setting of A-fib on Eliquis. DDx including seizure as she was unable to recall event, denies loss of consciousness.  MRI and carotid Dopplers unremarkable.  EF 65 to 70%.  EEG normal.  LDL 59 on atorvastatin 40 mg daily.  A1c 5.7. per note review, patient reported  she may have missed some doses of Eliquis but wished to continue Eliquis with supervision from family.  She returned back to baseline and was discharged home with family.  Since discharge, she has been doing well without any reoccurring episodes.  Reports continued mild cognitive difficulties with short-term memory impairment.  Worked with SLP and completed back in January with noting some improvement.  MMSE today 26/30 with deficits in attention/calculation and recall.  Doing compensation strategies. Does not do any type of memory exercises at home which has been discussed multiple times previously.  Continues to reside with her uncle.  Able to maintain ADLs independently but uncle does assist with majority of IADLs.  She has since ensured compliance with Eliquis and atorvastatin, denies side effects, uncle assists with management Blood pressure today 148/94 - monitors at home, typically 110-120s/60s She has not yet obtained CPAP machine despite new order being placed after prior visit back in November. Per uncle, he was contacted in February by DME company saying they were still having issues obtaining CPAP machines but he also noted being told this last February.  No further concerns at this time   HOSPITALIZATION PERTINENT IMAGING/LABS Code Stroke CT head No acute abnormality. ASPECTS 10.    MRI  No acute infarct MRA  Normal intracranial MRA Carotid Doppler  minimal thickening or plaque bilaterally 2D Echo EF 65-70%, severely dilated left atrium, moderate tricuspid regurgitation, no atrial level shunt EEG no seizure. LDL 59 HgbA1c 5.7   Update 06/18/2021 JM: Returns for 36-month stroke follow-up accompanied  by her uncle, Sarah Gilbert  Overall stable from stroke standpoint since prior visit without new stroke/TIA symptoms Continued short-term memory difficulties - stable.  Continue with ADLs and majority of ADLs independently.  She continues to live with her uncle. She is very frustrated  regarding her continued short-term memory difficulties. She is frustrated that she cannot remember things like she used to such as where she set something down, things people recently said to her, etc. Long term intact. She denies depression/anxiety but during visit, likely underlying anxiety contributing. She does admit to not doing ANY memory exercises (which has been discussed at prior few visits). She tries to keep active (involved in church, cares for her elderly aunt) but no routine exercise.  Reports she sleeps well at night.  Appetite good.  Compliant on Eliquis and atorvastatin -denies side effects Blood pressure today 128/62 -routinely monitors at home and typically stable Routinely followed by PCP [name], FNP   CPAP not yet started - per uncle, he spoke to DME since prior visit but they did not have any machines available - was told they would call him once one available but has not heard anything yet.   No new concerns at this time  Update 12/16/2020 JM: Ms. Jurkovich returns for 96-month stroke follow-up accompanied by her uncle, Sarah Gilbert from stroke standpoint without new stroke/TIA symptoms Reports continued short-term memory without worsening but denies improvement.  She does not do any memory exercises nor routine physical activity or exercise.  She is able to maintain ADLs independently and majority of IADLs.  She continues to live with her uncle who assists as needed Compliant on Eliquis and atorvastatin without associated side effects Blood pressure today 137/82 - monitors at home and typically lower 110-120/80s   She wished to initiate CPAP at prior follow-up visit which was initiated by Dr. Johny Sax nurse Baxter Hire Dinkins, RN. Per epic review, [facility]ero care attempted to reach him multiple times in February but per pt and uncle, they did not receive any calls to start.   No further concerns at this time   Update 06/17/2020 JM: Ms. Groller returns for stroke follow-up  accompanied by her uncle.  C/o short term memory difficulty since her stroke such as misplacing items as well as difficulty with comprehension or remembering what is being told her.  This has been stable without worsening.  She is able to maintain her ADLs and majority of IADLs independently. Hx of mild short-term memory impairment prior to her stroke.  She was evaluated by speech therapy at home and discharged as SLP not indicated per patient.  Denies new or worsening stroke/TIA symptoms.  She remains on Eliquis and atorvastatin for secondary stroke prevention without side effects.  Blood pressure today 136/72.  Evaluated by Dr. Frances Furbish for possible sleep apnea undergoing baseline sleep study on 03/31/2020 which did show evidence of mild sleep apnea and recommended initiating AutoPap therapy.  She was should further speak with her cardiologist and nephrologist prior to initiating who recommended treatment for sleep apnea and is now ready to proceed with AutoPap therapy.  No further concerns at this time.  Initial visit 02/13/2020 JM: Ms. Frigon is being seen for hospital follow-up accompanied by her uncle.  She has been doing well since discharge without residual stroke deficits.  Participated in [facility] PT[facility] which has since been completed.  She does report increased fatigue since her stroke.  Sleeps well at night.  Sleeps alone but denies snoring or witnessed apneas.  She  has not previously underwent sleep study to rule out sleep apnea.  Continues on Eliquis 5 mg twice daily without bleeding or bruising.  Continues to follow with cardiologist Dr. Sharyn Lull routinely.  Continues on atorvastatin 40 mg daily without myalgias.  Blood pressure today 143/80.  No concerns at this time.  Stroke admission 01/07/2020 Ms. Hoa Sharar is a 77 y.o. female with history of double kidney transplant (2011), HTN, . A fib (on xarelto), HLD who presented on 01/07/2020 with L isded weakness, R gaze, L facial and slurred speech.   Stroke work-up revealed several small right MCA infarcts w/ right ICA terminus thrombus s/p TICI 3 revascularization, infarcts embolic secondary to known AF off AC.  MRA head/neck unremarkable post IR.  Previously on Xarelto but per report, missed 3 to 4 days due to running out of prescription and transition to Eliquis 5 mg twice daily for secondary stroke prevention and history of atrial fibrillation.  History of HTN and resumed home amlodipine, Lasix and metoprolol.  History of HLD with LDL 74 and increased home dose atorvastatin from 20 mg to 40 mg daily.  Other stroke risk factors include advanced age and obesity but no prior stroke history.  Other active problems include ESRD s/p bilateral kidney transplant on Prograf and CellCept (not on HD), thyroid disease and hypokalemia.  Evaluated by therapies and discharged home with home health PT/OT.   Stroke:   Severeal small R MCA infarcts embolic secondary to known AF off AC Code Stroke CT head hyperdense R supraclinoid ICA. ASPECTS 10.    Cerebral angio R ICA terminus thrombus s/p TICI3 revascularization w/ 2 passes [address]) Post IR CT no hemorrhage, no statin  MRI  Several small scattered R hemisphere infarcts MRA head mild intracranial atherosclerosis  MRA neck Unremarkable  2D Echo EF 60-65%. No source of embolus. LA dilated LDL 74 -increase atorvastatin from 20 mg to 40 mg daily HgbA1c 6.1 Xarelto (rivaroxaban) daily prior to admission but had missed 3-4 days, now on Eliquis 5 mg bid.  Therapy recommendations:  HH PT, HH OT - [facility] Disposition:  return home      ROS:   14 system review of systems performed and negative with exception of short-term memory loss  PMH:  Past Medical History:  Diagnosis Date   Anemia    Arthritis    Atrial fibrillation (HCC)    Bleeding nose    history of    ESRD (end stage renal disease) (HCC)    History of bronchitis    Hyperlipemia    Hypertension    Joint inflammation    Obesity    PONV  (postoperative nausea and vomiting)    Rotator cuff tear arthropathy of right shoulder 06/18/2015   Thyroid disease    Wears glasses     PSH:  Past Surgical History:  Procedure Laterality Date   ABDOMINAL HYSTERECTOMY     APPENDECTOMY     CARDIAC CATHETERIZATION N/A 04/18/2015   Procedure: Left Heart Cath and Coronary Angiography;  Surgeon: Rinaldo Cloud, MD;  Location: [facility] INVASIVE CV LAB;  Service: Cardiovascular;  Laterality: N/A;   CHOLECYSTECTOMY     COLONOSCOPY     IR CT HEAD LTD  01/07/2020   IR PERCUTANEOUS ART THROMBECTOMY/INFUSION INTRACRANIAL INC DIAG ANGIO  01/07/2020   IR US GUIDE VASC ACCESS RIGHT  01/07/2020   KIDNEY TRANSPLANT     june 2011-[facility]   RADIOLOGY WITH ANESTHESIA N/A 01/07/2020   Procedure: IR WITH ANESTHESIA;  Surgeon: Radiologist, Medication,  MD;  Location: [facility] OR;  Service: Radiology;  Laterality: N/A;   TONSILLECTOMY     TOTAL SHOULDER ARTHROPLASTY Right 06/18/2015   Procedure: RIGHT TOTAL SHOULDER ARTHROPLASTY;  Surgeon: Teryl Lucy, MD;  Location: [facility] OR;  Service: Orthopedics;  Laterality: Right;    Social History:  Social History   Socioeconomic History   Marital status: Single    Spouse name: Not on file   Number of children: Not on file   Years of education: Not on file   Highest education level: Not on file  Occupational History   Occupation: retired  Tobacco Use   Smoking status: Never   Smokeless tobacco: Never  Vaping Use   Vaping status: Never Used  Substance and Sexual Activity   Alcohol use: No   Drug use: No   Sexual activity: Not Currently  Other Topics Concern   Not on file  Social History Narrative   Not on file   Social Determinants of Health   Financial Resource Strain: Low Risk  (12/31/2022)   Overall Financial Resource Strain (CARDIA)    Difficulty of Paying Living Expenses: Not hard at all  Food Insecurity: No Food Insecurity (12/31/2022)   Hunger Vital Sign    Worried About Running Out of Food in the Last Year:  Never true    Ran Out of Food in the Last Year: Never true  Transportation Needs: No Transportation Needs (12/31/2022)   PRAPARE - Administrator, Civil Service (Medical): No    Lack of Transportation (Non-Medical): No  Physical Activity: Inactive (12/31/2022)   Exercise Vital Sign    Days of Exercise per Week: 0 days    Minutes of Exercise per Session: 0 min  Stress: No Stress Concern Present (12/31/2022)   [employer] of Occupational Health - Occupational Stress Questionnaire    Feeling of Stress : Not at all  Social Connections: Not on file  Intimate Partner Violence: Not At Risk (09/29/2018)   Humiliation, Afraid, Rape, and Kick questionnaire    Fear of Current or Ex-Partner: No    Emotionally Abused: No    Physically Abused: No    Sexually Abused: No    Family History:  Family History  Problem Relation Age of Onset   Dementia Mother    Cancer Father    Breast cancer Neg Hx    Sleep apnea Neg Hx     Medications:   Current Outpatient Medications on File Prior to Visit  Medication Sig Dispense Refill   amLODipine (NORVASC) 10 MG tablet Take 5 mg by mouth daily.      apixaban (ELIQUIS) 5 MG TABS tablet Take 1 tablet (5 mg total) by mouth 2 (two) times daily. 60 tablet 2   atorvastatin (LIPITOR) 40 MG tablet Take 1 tablet (40 mg total) by mouth daily. 30 tablet 2   furosemide (LASIX) 40 MG tablet Take 40 mg by mouth 2 (two) times daily.     losartan (COZAAR) 25 MG tablet Take 25 mg by mouth daily.     MAGNESIUM PO Take 1 tablet by mouth in the morning and at bedtime.     metoprolol (LOPRESSOR) 50 MG tablet Take 50 mg by mouth 2 (two) times daily.     multivitamin (RENA-VIT) TABS tablet Take 1 tablet by mouth daily.     mycophenolate (CELLCEPT) 250 MG capsule Take 250 mg by mouth 2 (two) times daily.     nystatin cream (MYCOSTATIN) Apply 1 application topically 2 (two) times daily. (  Patient taking differently: Apply 1 Application topically 2 (two) times daily as  needed (rash). When able to remember) 30 g 2   pantoprazole (PROTONIX) 40 MG tablet Take 40 mg by mouth daily.      potassium chloride SA (K-DUR,KLOR-CON) 20 MEQ tablet Take 20 mEq by mouth 2 (two) times daily.     tacrolimus (PROGRAF) 0.5 MG capsule Take 0.5 mg by mouth every evening.     tacrolimus (PROGRAF) 1 MG capsule Take 2 mg by mouth 2 (two) times daily.     Vitamin D, Ergocalciferol, (DRISDOL) 1.25 MG (50000 UNIT) CAPS capsule Take 1 capsule (50,000 Units total) by mouth See admin instructions. Take 50,000 units by mouth twice a week on Thursday and Sunday 24 capsule 1   No current facility-administered medications on file prior to visit.    Allergies:   Allergies  Allergen Reactions   Vancomycin Hives, Itching, Rash and Other (See Comments)    Blistering, swelling, and peeling of feet.  Pt denies any similar reactions around face or neck.   Iohexol      Desc: iodine contrast/per dialysis center, Onset Date: [id number]       OBJECTIVE:  Physical Exam  There were no vitals filed for this visit.   There is no height or weight on file to calculate BMI. No results found.   General: well developed, well nourished, pleasant elderly African-American female, seated, in no evident distress Head: head normocephalic and atraumatic.   Neck: supple with no carotid or supraclavicular bruits Cardiovascular: irregular rate and rhythm, no murmurs Musculoskeletal: no deformity Skin:  no rash/petichiae Vascular:  Normal pulses all extremities   Neurologic Exam Mental Status: Awake and fully alert.  Fluent speech and language. Oriented to place and time. Recent memory subjectively impaired and remote memory intact. Attention span and concentration impaired with repeating questions and needing repeat clarification and fund of knowledge appropriate. Mood and affect appropriate.    09/16/2022    8:39 AM 03/11/2022    9:45 AM  MMSE - Mini Mental State Exam  Orientation to time 5 5   Orientation to Place 4 5  Registration 3 3  Attention/ Calculation 1 2  Recall 1 2  Language- name 2 objects 2 2  Language- repeat 1 1  Language- follow 3 step command 3 3  Language- read & follow direction 1 1  Write a sentence 1 1  Copy design 0 1  Total score 22 26   Cranial Nerves: Pupils equal, briskly reactive to light. Extraocular movements full without nystagmus. Visual fields full to confrontation. Hearing intact. Facial sensation intact. Face, tongue, palate moves normally and symmetrically.  Motor: Normal bulk and tone. Normal strength in all tested extremity muscles. Sensory.: intact to touch , pinprick , position and vibratory sensation.  Coordination: Rapid alternating movements normal in all extremities except slightly decreased left hand. Finger-to-nose and heel-to-shin performed accurately bilaterally. Mild action tremor right hand (chronic) Gait and Station: Arises from chair without difficulty. Stance is normal. Gait demonstrates normal stride length and balance with use of cane Reflexes: 1+ and symmetric. Toes downgoing.        ASSESSMENT/PLAN: Sarah Gilbert is a 77 y.o. year old female with history of several small right MCA infarcts in setting of right ICA terminus thrombus s/p TICI 3 revascularization on 01/07/2020 secondary to known AF missing dosages of Xarelto.  Transitioned to Eliquis at that time.  2 episodes of aphasia 02/07/2022 likely in setting of TIA with missing  doses of Eliquis although unable to completely rule out seizure activity. Vascular risk factors include HTN, HLD, A. Fib, obesity and diagnosis of mild OSA 03/2020 not on CPAP     Likely TIA Right MCA infarcts:  Residual deficits: Short-term memory complaints with baseline memory impairment prior to stroke likely mild cognitive impairment.  MMSE today 22/30 (prior 26/30).  Again, discussed importance of routine memory exercises as well as continued compensation strategies.  Also discussed  importance of increasing physical activity and socialization Continue  Eliquis 5 mg twice daily   and atorvastatin 40 mg daily for secondary stroke prevention PCP/cardiology.  Discussed importance of compliance with twice daily dosing regimen and importance of family closely monitoring Discussed secondary stroke prevention and importance of close PCP follow-up for management of stroke risk factors for secondary stroke prevention including HTN with BP goal<130/90 and HLD with LDL goal<40 Stroke labs 01/2022: LDL 59, A1c 5.7 - advised to f/u with PCP for updated lipid panel - should be completed every 6 months   Obstructive sleep apnea Baseline sleep study 03/31/2020 by Dr. Frances Furbish which showed mild OSA Declins CPAP therapy     Follow-up in 6 months or call earlier if needed - if remains stable, can f/u as needed   CC:  [name], FNP    I spent 34 minutes of face-to-face and non-face-to-face time with patient and uncle.  This included previsit chart review, lab review, study review, electronic health record documentation, patient and uncle education and discussion regarding above diagnoses and treatment plan and answered all other questions to patient and uncle satisfaction  Ihor Austin, AGNP-BC  [facility] [address] [place], [place] [address]  Phone [phone] Fax [phone] Note: This document was prepared with digital dictation and possible smart phrase technology. Any transcriptional errors that result from this process are unintentional.

## 2023-03-17 ENCOUNTER — Ambulatory Visit: Payer: Medicare Other | Admitting: Adult Health

## 2023-03-17 ENCOUNTER — Encounter: Payer: Self-pay | Admitting: Adult Health

## 2023-03-17 VITALS — BP 133/73 | HR 66 | Ht 65.0 in | Wt 192.6 lb

## 2023-03-17 DIAGNOSIS — G459 Transient cerebral ischemic attack, unspecified: Secondary | ICD-10-CM | POA: Diagnosis not present

## 2023-03-17 DIAGNOSIS — G3184 Mild cognitive impairment, so stated: Secondary | ICD-10-CM | POA: Diagnosis not present

## 2023-03-17 DIAGNOSIS — Z8673 Personal history of transient ischemic attack (TIA), and cerebral infarction without residual deficits: Secondary | ICD-10-CM | POA: Diagnosis not present

## 2023-03-17 NOTE — Patient Instructions (Addendum)
Very important to increase routine physical activity and exercise as well as routine memory exercises  Continue  Eliquis   and atorvastatin for secondary stroke prevention  Continue to follow up with PCP regarding blood pressure, cholesterol and diabetes management, please follow up with your primary doctor for repeat cholesterol levels and recommend routine monitoring of these every 6 months Maintain strict control of hypertension with blood pressure goal below 130/90, diabetes with hemoglobin A1c goal below 7.0 % and cholesterol with LDL cholesterol (bad cholesterol) goal below 70 mg/dL.   Signs of a Stroke? Follow the BEFAST method:  Balance Watch for a sudden loss of balance, trouble with coordination or vertigo Eyes Is there a sudden loss of vision in one or both eyes? Or double vision?  Face: Ask the person to smile. Does one side of the face droop or is it numb?  Arms: Ask the person to raise both arms. Does one arm drift downward? Is there weakness or numbness of a leg? Speech: Ask the person to repeat a simple phrase. Does the speech sound slurred/strange? Is the person confused ? Time: If you observe any of these signs, call 911.       Thank you for coming to see [name] at [facility]. I hope we have been able to provide you high quality care today.  You may receive a patient satisfaction survey over the next few weeks. We would appreciate your feedback and comments so that we may continue to improve ourselves and the health of our patients.     Management of Memory Problems  There are some general things you can do to help manage your memory problems.  Your memory may not in fact recover, but by using techniques and strategies you will be able to manage your memory difficulties better.  1)  Establish a routine. Try to establish and then stick to a regular routine.  By doing this, you will get used to what to expect and you will reduce the need to rely on your  memory.  Also, try to do things at the same time of day, such as taking your medication or checking your calendar first thing in the morning. Think about think that you can do as a part of a regular routine and make a list.  Then enter them into a daily planner to remind you.  This will help you establish a routine.  2)  Organize your environment. Organize your environment so that it is uncluttered.  Decrease visual stimulation.  Place everyday items such as keys or cell phone in the same place every day (ie.  Basket next to front door) Use post it notes with a brief message to yourself (ie. Turn off light, lock the door) Use labels to indicate where things go (ie. Which cupboards are for food, dishes, etc.) Keep a notepad and pen by the telephone to take messages  3)  Memory Aids A diary or journal/notebook/daily planner Making a list (shopping list, chore list, to do list that needs to be done) Using an alarm as a reminder (kitchen timer or cell phone alarm) Using cell phone to store information (Notes, Calendar, Reminders) Calendar/White board placed in a prominent position Post-it notes  In order for memory aids to be useful, you need to have good habits.  It's no good remembering to make a note in your journal if you don't remember to look in it.  Try setting aside a certain time of day to look in  journal.  4)  Improving mood and managing fatigue. There may be other factors that contribute to memory difficulties.  Factors, such as anxiety, depression and tiredness can affect memory. Regular gentle exercise can help improve your mood and give you more energy. Simple relaxation techniques may help relieve symptoms of anxiety Try to get back to completing activities or hobbies you enjoyed doing in the past. Learn to pace yourself through activities to decrease fatigue. Find out about some local support groups where you can share experiences with others. Try and achieve 7-8 hours of sleep  at night.

## 2023-04-05 ENCOUNTER — Other Ambulatory Visit: Payer: Self-pay | Admitting: Nephrology

## 2023-04-05 DIAGNOSIS — R609 Edema, unspecified: Secondary | ICD-10-CM

## 2023-04-05 DIAGNOSIS — R222 Localized swelling, mass and lump, trunk: Secondary | ICD-10-CM

## 2023-04-12 ENCOUNTER — Ambulatory Visit
Admission: RE | Admit: 2023-04-12 | Discharge: 2023-04-12 | Disposition: A | Discharge status: Home / Self Care | Payer: Medicare Other | Source: Ambulatory Visit | Attending: Nephrology | Admitting: Nephrology

## 2023-04-12 ENCOUNTER — Other Ambulatory Visit: Payer: Self-pay | Admitting: Nephrology

## 2023-04-12 ENCOUNTER — Ambulatory Visit: Admission: RE | Admit: 2023-04-12 | Payer: Medicare Other | Source: Ambulatory Visit

## 2023-04-12 DIAGNOSIS — R609 Edema, unspecified: Secondary | ICD-10-CM

## 2023-04-12 DIAGNOSIS — R222 Localized swelling, mass and lump, trunk: Secondary | ICD-10-CM

## 2023-04-16 ENCOUNTER — Other Ambulatory Visit: Payer: Medicare Other

## 2023-07-05 NOTE — Progress Notes (Unsigned)
Madelaine Bhat, CMA,acting as a Neurosurgeon for [name], FNP.,have documented all relevant documentation on the behalf of [name], FNP,as directed by  [name], FNP while in the presence of [name], FNP.  Subjective:  Patient ID: Sarah Gilbert , female    DOB: [birth year] , 77 y.o.   MRN: [id number]  No chief complaint on file.   HPI  Patient presents today for a bp and dm follow up, Patient reports compliance with medication. Patient denies any chest pain, SOB, or headaches. Patient has no concerns today.     Past Medical History:  Diagnosis Date  . Anemia   . Arthritis   . Atrial fibrillation (HCC)   . Bleeding nose    history of   . ESRD (end stage renal disease) (HCC)   . History of bronchitis   . Hyperlipemia   . Hypertension   . Joint inflammation   . Obesity   . PONV (postoperative nausea and vomiting)   . Rotator cuff tear arthropathy of right shoulder 06/18/2015  . Thyroid disease   . Wears glasses      Family History  Problem Relation Age of Onset  . Dementia Mother   . Cancer Father   . Breast cancer Neg Hx   . Sleep apnea Neg Hx      Current Outpatient Medications:  .  amLODipine (NORVASC) 10 MG tablet, Take 5 mg by mouth daily. , Disp: , Rfl:  .  apixaban (ELIQUIS) 5 MG TABS tablet, Take 1 tablet (5 mg total) by mouth 2 (two) times daily., Disp: 60 tablet, Rfl: 2 .  atorvastatin (LIPITOR) 40 MG tablet, Take 1 tablet (40 mg total) by mouth daily., Disp: 30 tablet, Rfl: 2 .  furosemide (LASIX) 40 MG tablet, Take 40 mg by mouth 2 (two) times daily., Disp: , Rfl:  .  losartan (COZAAR) 25 MG tablet, Take 25 mg by mouth daily., Disp: , Rfl:  .  MAGNESIUM PO, Take 1 tablet by mouth in the morning and at bedtime., Disp: , Rfl:  .  metoprolol (LOPRESSOR) 50 MG tablet, Take 50 mg by mouth 2 (two) times daily., Disp: , Rfl:  .  multivitamin (RENA-VIT) TABS tablet, Take 1 tablet by mouth daily., Disp: , Rfl:  .  mycophenolate (CELLCEPT) 250 MG  capsule, Take 250 mg by mouth 2 (two) times daily., Disp: , Rfl:  .  nystatin cream (MYCOSTATIN), Apply 1 application topically 2 (two) times daily. (Patient taking differently: Apply 1 Application topically 2 (two) times daily as needed (rash). When able to remember), Disp: 30 g, Rfl: 2 .  pantoprazole (PROTONIX) 40 MG tablet, Take 40 mg by mouth daily. , Disp: , Rfl:  .  potassium chloride SA (K-DUR,KLOR-CON) 20 MEQ tablet, Take 20 mEq by mouth 2 (two) times daily., Disp: , Rfl:  .  tacrolimus (PROGRAF) 0.5 MG capsule, Take 0.5 mg by mouth every evening., Disp: , Rfl:  .  tacrolimus (PROGRAF) 1 MG capsule, Take 2 mg by mouth 2 (two) times daily., Disp: , Rfl:  .  Vitamin D, Ergocalciferol, (DRISDOL) 1.25 MG (50000 UNIT) CAPS capsule, Take 1 capsule (50,000 Units total) by mouth See admin instructions. Take 50,000 units by mouth twice a week on Thursday and Sunday, Disp: 24 capsule, Rfl: 1   Allergies  Allergen Reactions  . Vancomycin Hives, Itching, Rash and Other (See Comments)    Blistering, swelling, and peeling of feet.  Pt denies any similar reactions around face or neck.  Marland Kitchen  Iohexol      Desc: iodine contrast/per dialysis center, Onset Date: [id number]      Review of Systems   There were no vitals filed for this visit. There is no height or weight on file to calculate BMI.  Wt Readings from Last 3 Encounters:  03/17/23 192 lb 9.6 oz (87.4 kg)  12/31/22 177 lb (80.3 kg)  12/31/22 177 lb 12.8 oz (80.6 kg)    The ASCVD Risk score (Arnett DK, et al., 2019) failed to calculate for the following reasons:   The patient has a prior MI or stroke diagnosis  Objective:  Physical Exam      Assessment And Plan:  Benign hypertensive heart disease without heart failure  Mixed hyperlipidemia  Abnormal glucose    No follow-ups on file.  Patient was given opportunity to ask questions. Patient verbalized understanding of the plan and was able to repeat key elements of the plan. All  questions were answered to their satisfaction.    Jeanell Sparrow, FNP, have reviewed all documentation for this visit. The documentation on 07/05/23 for the exam, diagnosis, procedures, and orders are all accurate and complete.   IF YOU HAVE BEEN REFERRED TO A SPECIALIST, IT MAY TAKE 1-2 WEEKS TO SCHEDULE/PROCESS THE REFERRAL. IF YOU HAVE NOT HEARD FROM US/SPECIALIST IN TWO WEEKS, PLEASE GIVE [name] A CALL AT [phone].

## 2023-07-07 ENCOUNTER — Ambulatory Visit: Payer: Medicare Other | Admitting: Nurse Practitioner

## 2023-07-07 ENCOUNTER — Encounter: Payer: Self-pay | Admitting: Nurse Practitioner

## 2023-07-07 VITALS — BP 128/68 | HR 75 | Temp 98.4°F | Ht 65.0 in | Wt 190.8 lb

## 2023-07-07 DIAGNOSIS — E559 Vitamin D deficiency, unspecified: Secondary | ICD-10-CM

## 2023-07-07 DIAGNOSIS — E782 Mixed hyperlipidemia: Secondary | ICD-10-CM

## 2023-07-07 DIAGNOSIS — Z23 Encounter for immunization: Secondary | ICD-10-CM | POA: Diagnosis not present

## 2023-07-07 DIAGNOSIS — I482 Chronic atrial fibrillation, unspecified: Secondary | ICD-10-CM | POA: Diagnosis not present

## 2023-07-07 DIAGNOSIS — R7309 Other abnormal glucose: Secondary | ICD-10-CM | POA: Diagnosis not present

## 2023-07-07 DIAGNOSIS — I119 Hypertensive heart disease without heart failure: Secondary | ICD-10-CM

## 2023-07-07 DIAGNOSIS — I69354 Hemiplegia and hemiparesis following cerebral infarction affecting left non-dominant side: Secondary | ICD-10-CM

## 2023-07-07 NOTE — Patient Instructions (Signed)
You need to get some support socks to wear during the day to help with any swelling, you can purchase these at [facility]

## 2023-07-08 ENCOUNTER — Other Ambulatory Visit: Payer: Self-pay | Admitting: Nurse Practitioner

## 2023-07-08 DIAGNOSIS — I359 Nonrheumatic aortic valve disorder, unspecified: Secondary | ICD-10-CM

## 2023-07-08 DIAGNOSIS — I69354 Hemiplegia and hemiparesis following cerebral infarction affecting left non-dominant side: Secondary | ICD-10-CM | POA: Insufficient documentation

## 2023-07-08 LAB — BASIC METABOLIC PANEL
BUN/Creatinine Ratio: 19 (ref 12–28)
BUN: 15 mg/dL (ref 8–27)
CO2: 23 mmol/L (ref 20–29)
Calcium: 10 mg/dL (ref 8.7–10.3)
Chloride: 102 mmol/L (ref 96–106)
Creatinine, Ser: 0.77 mg/dL (ref 0.57–1.00)
Glucose: 107 mg/dL — ABNORMAL HIGH (ref 70–99)
Potassium: 3.3 mmol/L — ABNORMAL LOW (ref 3.5–5.2)
Sodium: 142 mmol/L (ref 134–144)
eGFR: 80 mL/min/{1.73_m2} (ref >59)

## 2023-07-08 LAB — HEMOGLOBIN A1C
Est. average glucose Bld gHb Est-mCnc: 126 mg/dL
Hgb A1c MFr Bld: 6 % — ABNORMAL HIGH (ref 4.8–5.6)

## 2023-07-08 LAB — VITAMIN D 25 HYDROXY (VIT D DEFICIENCY, FRACTURES): Vit D, 25-Hydroxy: 74.6 ng/mL (ref 30.0–100.0)

## 2023-07-08 LAB — LIPID PANEL
Chol/HDL Ratio: 1.9 ratio (ref 0.0–4.4)
Cholesterol, Total: 160 mg/dL (ref 100–199)
HDL: 84 mg/dL (ref >39)
LDL Chol Calc (NIH): 69 mg/dL (ref 0–99)
Triglycerides: 28 mg/dL (ref 0–149)
VLDL Cholesterol Cal: 7 mg/dL (ref 5–40)

## 2023-07-20 ENCOUNTER — Other Ambulatory Visit: Payer: Self-pay | Admitting: Nurse Practitioner

## 2023-07-20 DIAGNOSIS — E876 Hypokalemia: Secondary | ICD-10-CM

## 2023-07-27 ENCOUNTER — Other Ambulatory Visit: Payer: Medicare Other

## 2023-07-27 DIAGNOSIS — E876 Hypokalemia: Secondary | ICD-10-CM

## 2023-07-28 LAB — POTASSIUM: Potassium: 3.4 mmol/L — ABNORMAL LOW (ref 3.5–5.2)

## 2023-08-06 ENCOUNTER — Other Ambulatory Visit: Payer: Self-pay | Admitting: Nurse Practitioner

## 2023-08-06 DIAGNOSIS — E559 Vitamin D deficiency, unspecified: Secondary | ICD-10-CM

## 2023-09-30 ENCOUNTER — Encounter: Payer: Self-pay | Admitting: Podiatry

## 2023-09-30 ENCOUNTER — Ambulatory Visit: Payer: Medicare Other | Admitting: Podiatry

## 2023-09-30 ENCOUNTER — Ambulatory Visit (INDEPENDENT_AMBULATORY_CARE_PROVIDER_SITE_OTHER): Payer: Medicare Other

## 2023-09-30 DIAGNOSIS — Z7901 Long term (current) use of anticoagulants: Secondary | ICD-10-CM

## 2023-09-30 DIAGNOSIS — L84 Corns and callosities: Secondary | ICD-10-CM

## 2023-09-30 DIAGNOSIS — M778 Other enthesopathies, not elsewhere classified: Secondary | ICD-10-CM | POA: Diagnosis not present

## 2023-09-30 DIAGNOSIS — M79672 Pain in left foot: Secondary | ICD-10-CM

## 2023-09-30 DIAGNOSIS — B351 Tinea unguium: Secondary | ICD-10-CM | POA: Diagnosis not present

## 2023-09-30 MED ORDER — AMMONIUM LACTATE 12 % EX CREA: 1.0000 | TOPICAL_CREAM | CUTANEOUS | 0 refills | Status: AC | PRN

## 2023-09-30 NOTE — Progress Notes (Signed)
  Subjective:  Patient ID: Sarah Gilbert, female    DOB: September 23, 1945,  MRN: [id number]  Chief Complaint  Patient presents with   Foot Pain    RM#11 NP - wound on foot, possible callous, renal trnasplant patient. Patient states trouble area on the outside of foot very painful.    Discussed the use of AI scribe software for clinical note transcription with the patient, who gave verbal consent to proceed.  History of Present Illness           Sarah Gilbert is a 78 year old female with atrial fibrillation and kidney disease who presents with a painful callus on the left foot.  The callus is located on the fifth metatarsal and has been present for several months, recently causing significant pain. There is no drainage or fluid from the area, and she has not received any prior treatment. She has not applied any creams or moisturizers due to uncertainty about management. No numbness, tingling, or injuries to the affected foot are reported.  She has a history of atrial fibrillation and is currently taking Eliquis. No new issues or changes in management were discussed during this visit.  She has a history of kidney disease and underwent a double transplant ten years ago, with no reported complications since the transplant.   Objective:    Physical Exam          General: AAO x3, NAD  Dermatological: Hyperkeratotic lesion to the left metatarsal base and also smaller lesion of the right posterior heel without any underlying ulceration, drainage or any signs of infection.  The nails are hypertrophic, dystrophic with brown discoloration and subungual debris present nails 2 through 5 bilaterally.  No open lesions.  Vascular: Dorsalis Pedis artery and Posterior Tibial artery pedal pulses are palpable bilateral with immedate capillary fill time. There is no pain with calf compression, swelling, warmth, erythema.   Neruologic: Grossly intact via light touch bilateral.  Musculoskeletal:  Tenderness to the left skin lesion but no other areas of discomfort. Gait: Unassisted, Nonantalgic.   No images are attached to the encounter.    Results          Assessment:   Onychomycosis, skin lesions x 2 on Eliquis  Plan:  Patient was evaluated and treated and all questions answered.  Assessment and Plan           Symptomatic onychomycosis, on Eliquis -Sharply pared nails x 8 without any complications or bleeding  Skin lesions x 2 -Sharply debrided the lesion left fifth metatarsal base as well as right posterior heel without any complications or bleeding.  Prescribed AmLactin and I dispensed offloading pads.  Radiology: X-rays obtained reviewed of the left foot at this with the painful skin lesion.  There is no evidence of acute fracture, calcifications in the soft tissue noted.   Return in about 4 weeks (around 10/28/2023) for skin lesion .  Vivi Barrack DPM

## 2023-10-12 NOTE — Progress Notes (Deleted)
 Madelaine Bhat, CMA,acting as a Neurosurgeon for [name], FNP.,have documented all relevant documentation on the behalf of [name], FNP,as directed by  [name], FNP while in the presence of [name], FNP.  Subjective:    Patient ID: Sarah Gilbert , female    DOB: Nov 20, 1945 , 78 y.o.   MRN: [id number]  No chief complaint on file.   HPI  Patient presents today for HM, Patient reports compliance with medication. Patient denies any chest pain, SOB, or headaches. Patient has no concerns today.     Past Medical History:  Diagnosis Date  . Anemia   . Arthritis   . Atrial fibrillation (HCC)   . Bleeding nose    history of   . ESRD (end stage renal disease) (HCC)   . History of bronchitis   . Hyperlipemia   . Hypertension   . Joint inflammation   . Obesity   . PONV (postoperative nausea and vomiting)   . Rotator cuff tear arthropathy of right shoulder 06/18/2015  . Thyroid disease   . Wears glasses      Family History  Problem Relation Age of Onset  . Dementia Mother   . Cancer Father   . Breast cancer Neg Hx   . Sleep apnea Neg Hx      Current Outpatient Medications:  .  amLODipine (NORVASC) 10 MG tablet, Take 5 mg by mouth daily. , Disp: , Rfl:  .  ammonium lactate (AMLACTIN) 12 % cream, Apply 1 Application topically as needed for dry skin., Disp: 385 g, Rfl: 0 .  apixaban (ELIQUIS) 5 MG TABS tablet, Take 1 tablet (5 mg total) by mouth 2 (two) times daily., Disp: 60 tablet, Rfl: 2 .  atorvastatin (LIPITOR) 40 MG tablet, Take 1 tablet (40 mg total) by mouth daily., Disp: 30 tablet, Rfl: 2 .  furosemide (LASIX) 40 MG tablet, Take 40 mg by mouth 2 (two) times daily., Disp: , Rfl:  .  losartan (COZAAR) 25 MG tablet, Take 25 mg by mouth daily., Disp: , Rfl:  .  MAGNESIUM PO, Take 1 tablet by mouth in the morning and at bedtime., Disp: , Rfl:  .  metoprolol (LOPRESSOR) 50 MG tablet, Take 50 mg by mouth 2 (two) times daily., Disp: , Rfl:  .  multivitamin  (RENA-VIT) TABS tablet, Take 1 tablet by mouth daily., Disp: , Rfl:  .  mycophenolate (CELLCEPT) 250 MG capsule, Take 250 mg by mouth 2 (two) times daily., Disp: , Rfl:  .  nystatin cream (MYCOSTATIN), Apply 1 application topically 2 (two) times daily. (Patient taking differently: Apply 1 Application topically 2 (two) times daily as needed (rash). When able to remember), Disp: 30 g, Rfl: 2 .  pantoprazole (PROTONIX) 40 MG tablet, Take 40 mg by mouth daily. , Disp: , Rfl:  .  potassium chloride SA (K-DUR,KLOR-CON) 20 MEQ tablet, Take 20 mEq by mouth 2 (two) times daily., Disp: , Rfl:  .  tacrolimus (PROGRAF) 0.5 MG capsule, Take 0.5 mg by mouth every evening., Disp: , Rfl:  .  tacrolimus (PROGRAF) 1 MG capsule, Take 2 mg by mouth 2 (two) times daily., Disp: , Rfl:  .  Vitamin D, Ergocalciferol, (DRISDOL) 1.25 MG (50000 UNIT) CAPS capsule, TAKE 1 CAPSULE BY MOUTH TWICE A WEEK ON THURSDAY AND SUNDAY, Disp: 24 capsule, Rfl: 1   Allergies  Allergen Reactions  . Vancomycin Hives, Itching, Rash and Other (See Comments)    Blistering, swelling, and peeling of feet.  Pt  denies any similar reactions around face or neck.  . Iohexol      Desc: iodine contrast/per dialysis center, Onset Date: [id number]       The patient states she uses {contraceptive methods:5051} for birth control. No LMP recorded. Patient has had a hysterectomy.. {Dysmenorrhea-menorrhagia:21918}. Negative for: breast discharge, breast lump(s), breast pain and breast self exam. Associated symptoms include abnormal vaginal bleeding. Pertinent negatives include abnormal bleeding (hematology), anxiety, decreased libido, depression, difficulty falling sleep, dyspareunia, history of infertility, nocturia, sexual dysfunction, sleep disturbances, urinary incontinence, urinary urgency, vaginal discharge and vaginal itching. Diet regular.The patient states her exercise level is    . The patient's tobacco use is:  Social History   Tobacco Use   Smoking Status Never  Smokeless Tobacco Never  . She has been exposed to passive smoke. The patient's alcohol use is:  Social History   Substance and Sexual Activity  Alcohol Use No  . Additional information: Last pap ***, next one scheduled for ***.    Review of Systems   There were no vitals filed for this visit. There is no height or weight on file to calculate BMI.  Wt Readings from Last 3 Encounters:  07/07/23 190 lb 12.8 oz (86.5 kg)  03/17/23 192 lb 9.6 oz (87.4 kg)  12/31/22 177 lb (80.3 kg)     Objective:  Physical Exam      Assessment And Plan:     Encounter for annual health examination  Mixed hyperlipidemia  Benign hypertensive heart disease without heart failure  Vitamin D deficiency     No follow-ups on file. Patient was given opportunity to ask questions. Patient verbalized understanding of the plan and was able to repeat key elements of the plan. All questions were answered to their satisfaction.   [name], FNP  I, [name], FNP, have reviewed all documentation for this visit. The documentation on 10/12/23 for the exam, diagnosis, procedures, and orders are all accurate and complete.

## 2023-10-13 ENCOUNTER — Encounter: Payer: Medicare Other | Admitting: Nurse Practitioner

## 2023-10-13 DIAGNOSIS — E559 Vitamin D deficiency, unspecified: Secondary | ICD-10-CM

## 2023-10-13 DIAGNOSIS — E782 Mixed hyperlipidemia: Secondary | ICD-10-CM

## 2023-10-13 DIAGNOSIS — Z0000 Encounter for general adult medical examination without abnormal findings: Secondary | ICD-10-CM

## 2023-10-13 DIAGNOSIS — I119 Hypertensive heart disease without heart failure: Secondary | ICD-10-CM

## 2023-10-19 ENCOUNTER — Telehealth: Payer: Self-pay | Admitting: Adult Health

## 2023-10-19 NOTE — Telephone Encounter (Signed)
 Pt's god daughter, Jeanelle Malling on [place]) calling to schedule patient an appointment. She's having memory issues that has worsened and want to see about a medication to get her started on.

## 2023-10-19 NOTE — Telephone Encounter (Signed)
 Noted pt has appointment 10/25/2023 with Marjorie Smolder

## 2023-10-25 ENCOUNTER — Encounter: Payer: Self-pay | Admitting: Adult Health

## 2023-10-25 ENCOUNTER — Ambulatory Visit: Admitting: Adult Health

## 2023-10-25 VITALS — BP 139/78 | HR 75 | Ht 65.0 in | Wt 184.0 lb

## 2023-10-25 DIAGNOSIS — G3184 Mild cognitive impairment, so stated: Secondary | ICD-10-CM

## 2023-10-25 DIAGNOSIS — G4733 Obstructive sleep apnea (adult) (pediatric): Secondary | ICD-10-CM | POA: Diagnosis not present

## 2023-10-25 DIAGNOSIS — Z8673 Personal history of transient ischemic attack (TIA), and cerebral infarction without residual deficits: Secondary | ICD-10-CM | POA: Diagnosis not present

## 2023-10-25 MED ORDER — MEMANTINE HCL 28 X 5 MG & 21 X 10 MG PO TABS: ORAL_TABLET | ORAL | 0 refills | Status: DC

## 2023-10-25 MED ORDER — MEMANTINE HCL 10 MG PO TABS: 10.0000 mg | ORAL_TABLET | Freq: Two times a day (BID) | ORAL | 5 refills | Status: DC

## 2023-10-25 NOTE — Patient Instructions (Addendum)
 Your Plan:  Start Namenda - start with titration pack, follow directions on packet, end dose will be 10mg  twice daily  If any difficulty tolerating, please let me know  Continue cognitive exercises, ensure routine physical activity/exercise, routine socialization, ensuring good sleep and healthy diet  Continue to follow with PCP for stroke risk factor management     Follow up in 6 months or call earlier if needed       Thank you for coming to see [name] at [facility]. I hope we have been able to provide you high quality care today.  You may receive a patient satisfaction survey over the next few weeks. We would appreciate your feedback and comments so that we may continue to improve ourselves and the health of our patients.    Memantine Tablets What is this medication? MEMANTINE (MEM an teen) treats memory loss and confusion (dementia) in people who have Alzheimer disease. It works by improving attention, memory, and the ability to engage in daily activities. It is not a cure for dementia or Alzheimer disease. This medicine may be used for other purposes; ask your health care provider or pharmacist if you have questions. COMMON BRAND NAME(S): Namenda What should I tell my care team before I take this medication? They need to know if you have any of these conditions: Kidney disease Liver disease Seizures Trouble passing urine An unusual or allergic reaction to memantine, other medications, foods, dyes, or preservatives Pregnant or trying to get pregnant Breast-feeding How should I use this medication? Take this medication by mouth with water. Follow the directions on the prescription label. You may take this medication with or without food. Take your doses at regular intervals. Do not take your medication more often than directed. Continue to take your medication even if you feel better. Do not stop taking except on the advice of your care team. Talk to your  care team about the use of this medication in children. Special care may be needed Overdosage: If you think you have taken too much of this medicine contact a poison control center or emergency room at once. NOTE: This medicine is only for you. Do not share this medicine with others. What if I miss a dose? If you miss a dose, take it as soon as you can. If it is almost time for your next dose, take only that dose. Do not take double or extra doses. If you do not take your medication for several days, contact your care team. Your dose may need to be changed. What may interact with this medication? Acetazolamide Amantadine Cimetidine Dextromethorphan Dofetilide Hydrochlorothiazide Ketamine Metformin Methazolamide Quinidine Ranitidine Sodium bicarbonate Triamterene This list may not describe all possible interactions. Give your health care provider a list of all the medicines, herbs, non-prescription drugs, or dietary supplements you use. Also tell them if you smoke, drink alcohol, or use illegal drugs. Some items may interact with your medicine. What should I watch for while using this medication? Visit your care team for regular checks on your progress. Check with your care team if there is no improvement in your symptoms or if they get worse. This medication may affect your coordination, reaction time, or judgment. Do not drive or operate machinery until you know how this medication affects you. Sit up or stand slowly to reduce the risk of dizzy or fainting spells. Drinking alcohol with this medication can increase the risk of these side effects. What side effects may I notice from receiving  this medication? Side effects that you should report to your care team as soon as possible: Allergic reactions--skin rash, itching, hives, swelling of the face, lips, tongue, or throat Side effects that usually do not require medical attention (report to your care team if they continue or are  bothersome): Confusion Constipation Diarrhea Dizziness Headache This list may not describe all possible side effects. Call your doctor for medical advice about side effects. You may report side effects to FDA at 1-800-FDA-1088. Where should I keep my medication? Keep out of the reach of children. Store at room temperature between 15 degrees and 30 degrees C (59 degrees and 86 degrees F). Throw away any unused medication after the expiration date. NOTE: This sheet is a summary. It may not cover all possible information. If you have questions about this medicine, talk to your doctor, pharmacist, or health care provider.  2024 Elsevier/Gold Standard (2021-08-26 00:00:00)

## 2023-10-25 NOTE — Progress Notes (Signed)
 [facility] [address] [place]. [place] [address] [phone]       OFFICE FOLLOW UP NOTE  [name] Date of Birth:  11-06-1945 Medical Record Number:  [id number]    Primary neurologist: Dr. Pearlean Brownie Reason for visit: Cognitive decline, hx of stroke   Chief Complaint  Patient presents with   Memory Loss    Rm 8 with daughter Sarah Gilbert and Sarah Gilbert (home nurse) Pt is well, reports she has been more repetitive in asking the same thing or doing the same actions. Her short term memory has decline     SUBJECTIVE:   HPI:   Update 10/25/2023 JM: Patient returns per daughter due to concerns of worsening cognition, she is accompanied by her cousin, Sarah Gilbert, and Scientist, forensic, [place].  She was previously seen 8 months ago and advised to follow-up as needed is overall stable from stroke standpoint.  Patient primarily concerned that she continues to have memory difficulties without improvement. Family reports some decline over the past year, frequently repeats herself and has difficulty caring through tasks.  Her uncle still lives with her, she is able to maintain ADLs independently, god daughter helps with medications and daughter helps with bills and grocery shopping. Patient still cooks, reports no difficulty with this.  Reports she sleeps well, has a good appetite.  She does do memory exercises at home with a puzzle book, tries to keep active around her home but no specific daily exercise.  Ambulates with a cane outdoors, does not use indoors, denies any recent falls. Denies new stroke/TIA symptoms.  Reports compliance on Eliquis and atorvastatin.  Has follow-up with PCP in April.    History provided for reference purposes only Update 03/17/2023 JM: Returns for follow-up visit accompanied by her uncle.  Has been stable without new stroke/TIA symptoms.  Reports memory has been stable since prior visit, frustrated that memory has not improved. MMSE today 22/30 (prior 22/30).  At times can forget what she needs to be doing but remembers shortly after. Continues to live with her uncle, maintains ADLs independently, uncle assist with IADLs.  Ambulates with a cane, denies any recent falls.  Reports more recently noticing some right leg weakness.  She remains sedentary with limited physical activity or exercise. She does enjoy reading and writing cards but otherwise still not doing any other memory exercises.  Reports sleeping well and good appetite.  No behavioral concerns.  Compliant on Eliquis and atorvastatin.  Routinely follows with PCP for stroke risk factor management. Routinely follows with nephrology, f/u visit next week  Update 09/16/2022 JM: Patient returns for 76-month stroke follow-up.  Overall stable without new stroke/TIA symptoms.  Residual cognitive impairment about the same since prior visit per patient and uncle.  MMSE today 22/30 (prior 26/30 maintains ADLs independently, lives with uncle who assists with IADLs.  She does like to read and write but does not do any other type of memory exercises nor any routine physical activity.  Compliant with Eliquis and atorvastatin Blood pressure well-controlled She was seen by Dr. Frances Furbish on 04/21/2022 for OSA as she was interested in pursuing CPAP but at that visit, declined interest in CPAP therapy.   Update 03/10/2022 JM: Patient is being seen today for hospital follow-up accompanied by her uncle.  She presented to [facility] ED on 6/24 with 2 episodes of aphasia lasting 15 to 20 minutes which resolved without intervention.  MRI negative for acute stroke.  Etiology of symptoms likely TIA in setting of A-fib on Eliquis.  DDx including seizure as she was unable to recall event, denies loss of consciousness.  MRI and carotid Dopplers unremarkable.  EF 65 to 70%.  EEG normal.  LDL 59 on atorvastatin 40 mg daily.  A1c 5.7. per note review, patient reported she may have missed some doses of Eliquis but wished to continue Eliquis with  supervision from family.  She returned back to baseline and was discharged home with family.  Since discharge, she has been doing well without any reoccurring episodes.  Reports continued mild cognitive difficulties with short-term memory impairment.  Worked with SLP and completed back in January with noting some improvement.  MMSE today 26/30 with deficits in attention/calculation and recall.  Doing compensation strategies. Does not do any type of memory exercises at home which has been discussed multiple times previously.  Continues to reside with her uncle.  Able to maintain ADLs independently but uncle does assist with majority of IADLs.  She has since ensured compliance with Eliquis and atorvastatin, denies side effects, uncle assists with management Blood pressure today 148/94 - monitors at home, typically 110-120s/60s She has not yet obtained CPAP machine despite new order being placed after prior visit back in November. Per uncle, he was contacted in February by DME company saying they were still having issues obtaining CPAP machines but he also noted being told this last February.  No further concerns at this time   HOSPITALIZATION PERTINENT IMAGING/LABS Code Stroke CT head No acute abnormality. ASPECTS 10.    MRI  No acute infarct MRA  Normal intracranial MRA Carotid Doppler  minimal thickening or plaque bilaterally 2D Echo EF 65-70%, severely dilated left atrium, moderate tricuspid regurgitation, no atrial level shunt EEG no seizure. LDL 59 HgbA1c 5.7   Update 06/18/2021 JM: Returns for 24-month stroke follow-up accompanied by her uncle, Sarah Gilbert  Overall stable from stroke standpoint since prior visit without new stroke/TIA symptoms Continued short-term memory difficulties - stable.  Continue with ADLs and majority of ADLs independently.  She continues to live with her uncle. She is very frustrated regarding her continued short-term memory difficulties. She is frustrated that she  cannot remember things like she used to such as where she set something down, things people recently said to her, etc. Long term intact. She denies depression/anxiety but during visit, likely underlying anxiety contributing. She does admit to not doing ANY memory exercises (which has been discussed at prior few visits). She tries to keep active (involved in church, cares for her elderly aunt) but no routine exercise.  Reports she sleeps well at night.  Appetite good.  Compliant on Eliquis and atorvastatin -denies side effects Blood pressure today 128/62 -routinely monitors at home and typically stable Routinely followed by PCP [name], FNP   CPAP not yet started - per uncle, he spoke to DME since prior visit but they did not have any machines available - was told they would call him once one available but has not heard anything yet.   No new concerns at this time  Update 12/16/2020 JM: Ms. Thielke returns for 37-month stroke follow-up accompanied by her uncle, Sarah Gilbert from stroke standpoint without new stroke/TIA symptoms Reports continued short-term memory without worsening but denies improvement.  She does not do any memory exercises nor routine physical activity or exercise.  She is able to maintain ADLs independently and majority of IADLs.  She continues to live with her uncle who assists as needed Compliant on Eliquis and atorvastatin without associated side effects Blood  pressure today 137/82 - monitors at home and typically lower 110-120/80s   She wished to initiate CPAP at prior follow-up visit which was initiated by Dr. Johny Sax nurse Baxter Hire Dinkins, RN. Per epic review, aero care attempted to reach him multiple times in February but per pt and uncle, they did not receive any calls to start.   No further concerns at this time   Update 06/17/2020 JM: Ms. Osterberg returns for stroke follow-up accompanied by her uncle.  C/o short term memory difficulty since her stroke such as  misplacing items as well as difficulty with comprehension or remembering what is being told her.  This has been stable without worsening.  She is able to maintain her ADLs and majority of IADLs independently. Hx of mild short-term memory impairment prior to her stroke.  She was evaluated by speech therapy at home and discharged as SLP not indicated per patient.  Denies new or worsening stroke/TIA symptoms.  She remains on Eliquis and atorvastatin for secondary stroke prevention without side effects.  Blood pressure today 136/72.  Evaluated by Dr. Frances Furbish for possible sleep apnea undergoing baseline sleep study on 03/31/2020 which did show evidence of mild sleep apnea and recommended initiating AutoPap therapy.  She was should further speak with her cardiologist and nephrologist prior to initiating who recommended treatment for sleep apnea and is now ready to proceed with AutoPap therapy.  No further concerns at this time.  Initial visit 02/13/2020 JM: Ms. Braddock is being seen for hospital follow-up accompanied by her uncle.  She has been doing well since discharge without residual stroke deficits.  Participated in [facility] PT/OT which has since been completed.  She does report increased fatigue since her stroke.  Sleeps well at night.  Sleeps alone but denies snoring or witnessed apneas.  She has not previously underwent sleep study to rule out sleep apnea.  Continues on Eliquis 5 mg twice daily without bleeding or bruising.  Continues to follow with cardiologist Dr. Sharyn Lull routinely.  Continues on atorvastatin 40 mg daily without myalgias.  Blood pressure today 143/80.  No concerns at this time.  Stroke admission 01/07/2020 Ms. Lajune Perine is a 78 y.o. female with history of double kidney transplant (2011), HTN, . A fib (on xarelto), HLD who presented on 01/07/2020 with L isded weakness, R gaze, L facial and slurred speech.  Stroke work-up revealed several small right MCA infarcts w/ right ICA terminus thrombus  s/p TICI 3 revascularization, infarcts embolic secondary to known AF off AC.  MRA head/neck unremarkable post IR.  Previously on Xarelto but per report, missed 3 to 4 days due to running out of prescription and transition to Eliquis 5 mg twice daily for secondary stroke prevention and history of atrial fibrillation.  History of HTN and resumed home amlodipine, Lasix and metoprolol.  History of HLD with LDL 74 and increased home dose atorvastatin from 20 mg to 40 mg daily.  Other stroke risk factors include advanced age and obesity but no prior stroke history.  Other active problems include ESRD s/p bilateral kidney transplant on Prograf and CellCept (not on HD), thyroid disease and hypokalemia.  Evaluated by therapies and discharged home with home health PT/OT.   Stroke:   Severeal small R MCA infarcts embolic secondary to known AF off AC Code Stroke CT head hyperdense R supraclinoid ICA. ASPECTS 10.    Cerebral angio R ICA terminus thrombus s/p TICI3 revascularization w/ 2 passes [address]) Post IR CT no hemorrhage, no statin  MRI  Several small scattered R hemisphere infarcts MRA head mild intracranial atherosclerosis  MRA neck Unremarkable  2D Echo EF 60-65%. No source of embolus. LA dilated LDL 74 -increase atorvastatin from 20 mg to 40 mg daily HgbA1c 6.1 Xarelto (rivaroxaban) daily prior to admission but had missed 3-4 days, now on Eliquis 5 mg bid.  Therapy recommendations:  HH PT, HH OT - [facility] Disposition:  return home      ROS:   14 system review of systems performed and negative with exception of short-term memory loss  PMH:  Past Medical History:  Diagnosis Date   Anemia    Arthritis    Atrial fibrillation (HCC)    Bleeding nose    history of    ESRD (end stage renal disease) (HCC)    History of bronchitis    Hyperlipemia    Hypertension    Joint inflammation    Obesity    PONV (postoperative nausea and vomiting)    Rotator cuff tear arthropathy of right shoulder  06/18/2015   Thyroid disease    Wears glasses     PSH:  Past Surgical History:  Procedure Laterality Date   ABDOMINAL HYSTERECTOMY     APPENDECTOMY     CARDIAC CATHETERIZATION N/A 04/18/2015   Procedure: Left Heart Cath and Coronary Angiography;  Surgeon: Rinaldo Cloud, MD;  Location: [facility] INVASIVE CV LAB;  Service: Cardiovascular;  Laterality: N/A;   CHOLECYSTECTOMY     COLONOSCOPY     IR CT HEAD LTD  01/07/2020   IR PERCUTANEOUS ART THROMBECTOMY/INFUSION INTRACRANIAL INC DIAG ANGIO  01/07/2020   IR US GUIDE VASC ACCESS RIGHT  01/07/2020   KIDNEY TRANSPLANT     june 2011-[facility]   RADIOLOGY WITH ANESTHESIA N/A 01/07/2020   Procedure: IR WITH ANESTHESIA;  Surgeon: Radiologist, Medication, MD;  Location: [facility] OR;  Service: Radiology;  Laterality: N/A;   TONSILLECTOMY     TOTAL SHOULDER ARTHROPLASTY Right 06/18/2015   Procedure: RIGHT TOTAL SHOULDER ARTHROPLASTY;  Surgeon: Teryl Lucy, MD;  Location: [facility] OR;  Service: Orthopedics;  Laterality: Right;    Social History:  Social History   Socioeconomic History   Marital status: Single    Spouse name: Not on file   Number of children: Not on file   Years of education: Not on file   Highest education level: Not on file  Occupational History   Occupation: retired  Tobacco Use   Smoking status: Never   Smokeless tobacco: Never  Vaping Use   Vaping status: Never Used  Substance and Sexual Activity   Alcohol use: No   Drug use: No   Sexual activity: Not Currently  Other Topics Concern   Not on file  Social History Narrative   Not on file   Social Drivers of Health   Financial Resource Strain: Low Risk  (12/31/2022)   Overall Financial Resource Strain (CARDIA)    Difficulty of Paying Living Expenses: Not hard at all  Food Insecurity: No Food Insecurity (12/31/2022)   Hunger Vital Sign    Worried About Running Out of Food in the Last Year: Never true    Ran Out of Food in the Last Year: Never true  Transportation Needs: No  Transportation Needs (12/31/2022)   PRAPARE - Administrator, Civil Service (Medical): No    Lack of Transportation (Non-Medical): No  Physical Activity: Inactive (12/31/2022)   Exercise Vital Sign    Days of Exercise per Week: 0 days    Minutes of Exercise per  Session: 0 min  Stress: No Stress Concern Present (12/31/2022)   [employer] of Occupational Health - Occupational Stress Questionnaire    Feeling of Stress : Not at all  Social Connections: Not on file  Intimate Partner Violence: Not At Risk (09/29/2018)   Humiliation, Afraid, Rape, and Kick questionnaire    Fear of Current or Ex-Partner: No    Emotionally Abused: No    Physically Abused: No    Sexually Abused: No    Family History:  Family History  Problem Relation Age of Onset   Dementia Mother    Cancer Father    Breast cancer Neg Hx    Sleep apnea Neg Hx     Medications:   Current Outpatient Medications on File Prior to Visit  Medication Sig Dispense Refill   amLODipine (NORVASC) 10 MG tablet Take 5 mg by mouth daily.      ammonium lactate (AMLACTIN) 12 % cream Apply 1 Application topically as needed for dry skin. 385 g 0   apixaban (ELIQUIS) 5 MG TABS tablet Take 1 tablet (5 mg total) by mouth 2 (two) times daily. 60 tablet 2   atorvastatin (LIPITOR) 40 MG tablet Take 1 tablet (40 mg total) by mouth daily. 30 tablet 2   furosemide (LASIX) 40 MG tablet Take 40 mg by mouth 2 (two) times daily.     losartan (COZAAR) 25 MG tablet Take 25 mg by mouth daily.     MAGNESIUM PO Take 1 tablet by mouth in the morning and at bedtime.     metoprolol (LOPRESSOR) 50 MG tablet Take 50 mg by mouth 2 (two) times daily.     multivitamin (RENA-VIT) TABS tablet Take 1 tablet by mouth daily.     mycophenolate (CELLCEPT) 250 MG capsule Take 250 mg by mouth 2 (two) times daily.     nystatin cream (MYCOSTATIN) Apply 1 application topically 2 (two) times daily. (Patient taking differently: Apply 1 Application topically 2  (two) times daily as needed (rash). When able to remember) 30 g 2   pantoprazole (PROTONIX) 40 MG tablet Take 40 mg by mouth daily.      potassium chloride SA (K-DUR,KLOR-CON) 20 MEQ tablet Take 20 mEq by mouth 2 (two) times daily.     tacrolimus (PROGRAF) 0.5 MG capsule Take 0.5 mg by mouth every evening.     tacrolimus (PROGRAF) 1 MG capsule Take 2 mg by mouth 2 (two) times daily.     Vitamin D, Ergocalciferol, (DRISDOL) 1.25 MG (50000 UNIT) CAPS capsule TAKE 1 CAPSULE BY MOUTH TWICE A WEEK ON THURSDAY AND SUNDAY 24 capsule 1   No current facility-administered medications on file prior to visit.    Allergies:   Allergies  Allergen Reactions   Vancomycin Hives, Itching, Rash and Other (See Comments)    Blistering, swelling, and peeling of feet.  Pt denies any similar reactions around face or neck.   Iohexol      Desc: iodine contrast/per dialysis center, Onset Date: [id number]       OBJECTIVE:  Physical Exam  Vitals:   10/25/23 1043  BP: 139/78  Pulse: 75  Weight: 184 lb (83.5 kg)  Height: 5\' 5"  (1.651 m)   Body mass index is 30.62 kg/m. No results found.  General: well developed, well nourished, pleasant elderly African-American female, seated, in no evident distress Head: head normocephalic and atraumatic.   Neck: supple with no carotid or supraclavicular bruits Cardiovascular: irregular rate and rhythm, no murmurs  Neurologic Exam Mental Status:  Awake and fully alert.  Fluent speech and language. Oriented to place, reported month Feb. Recent memory impaired and remote memory intact. Attention span and concentration appropriate today and fund of knowledge appropriate. Mood and affect appropriate.     10/25/2023   10:48 AM 03/17/2023    7:58 AM 09/16/2022    8:39 AM  MMSE - Mini Mental State Exam  Orientation to time 3 3 5   Orientation to Place 5 5 4   Registration 3 3 3   Attention/ Calculation 1 1 1   Recall 2 3 1   Language- name 2 objects 2 2 2   Language- repeat  1 1 1   Language- follow 3 step command 3 3 3   Language- read & follow direction 1 0 1  Write a sentence 1 1 1   Copy design 0 0 0  Total score 22 22 22    Cranial Nerves: Pupils equal, briskly reactive to light. Extraocular movements full without nystagmus. Visual fields full to confrontation. Hearing intact. Facial sensation intact. Face, tongue, palate moves normally and symmetrically.  Motor: Normal bulk and tone. Normal strength in all tested extremity muscles. Sensory.: intact to touch , pinprick , position and vibratory sensation.  Coordination: Rapid alternating movements normal in all extremities except slightly decreased left hand. Finger-to-nose and heel-to-shin performed accurately bilaterally.  No evidence of tremor on exam today. Gait and Station: Arises from chair without difficulty. Stance is normal. Gait demonstrates normal stride length and balance with use of cane.  Tandem walk and heel toe not attempted Reflexes: 1+ and symmetric. Toes downgoing.        ASSESSMENT/PLAN: Sarah Gilbert is a 78 y.o. year old female with history of several small right MCA infarcts in setting of right ICA terminus thrombus s/p TICI 3 revascularization on 01/07/2020 secondary to known AF missing dosages of Xarelto.  Transitioned to Eliquis at that time.  2 episodes of aphasia 02/07/2022 likely in setting of TIA with missing doses of Eliquis although unable to completely rule out seizure activity. Vascular risk factors include HTN, HLD, A. Fib, obesity and diagnosis of mild OSA 03/2020 not on CPAP.  Returns today, 10/25/2023, with worsening cognition per family     Cognitive complaints Gradual decline over the past year per family more so in regards to short-term memory MMSE today 22/30 (prior 22/30) Start memantine with gradual titration to 10 mg twice daily, discussed potential side effects Discussed importance of routine cognitive and physical exercise, importance of social interaction,  ensuring good sleep and maintaining a healthy diet Discussed untreated sleep apnea possibly contributing, declines interest in treatment  Likely TIA Right MCA infarcts:  Continue  Eliquis 5 mg twice daily   and atorvastatin 40 mg daily for secondary stroke prevention managed/prescribed by PCP/cardiology Discussed secondary stroke prevention and importance of close PCP follow-up for management of stroke risk factors for secondary stroke prevention including HTN with BP goal<130/90 and HLD with LDL goal<40 Stroke labs 06/2023: LDL 69, A1c 6.0  Obstructive sleep apnea Baseline sleep study 03/31/2020 by Dr. Frances Furbish which showed mild OSA Declines CPAP therapy, again discussed risk of untreated sleep apnea     Follow-up in 6 months for repeat MMSE or call earlier if needed   CC:  [name], FNP    I spent 30 minutes of face-to-face and non-face-to-face time with patient and family.  This included previsit chart review, lab review, study review, electronic health record documentation, patient and family education and discussion regarding above diagnoses and treatment plan and answered  all other questions to patient and family satisfaction  Ihor Austin, [facility]  [facility] [address] [place], [place] [address]  Phone [phone] Fax [phone] Note: This document was prepared with digital dictation and possible smart phrase technology. Any transcriptional errors that result from this process are unintentional.

## 2023-10-28 ENCOUNTER — Ambulatory Visit: Payer: Medicare Other | Admitting: Podiatry

## 2023-10-28 ENCOUNTER — Encounter: Payer: Self-pay | Admitting: Podiatry

## 2023-10-28 DIAGNOSIS — L97522 Non-pressure chronic ulcer of other part of left foot with fat layer exposed: Secondary | ICD-10-CM | POA: Diagnosis not present

## 2023-10-28 DIAGNOSIS — L84 Corns and callosities: Secondary | ICD-10-CM | POA: Diagnosis not present

## 2023-10-28 MED ORDER — SILVER SULFADIAZINE 1 % EX CREA: 1.0000 | TOPICAL_CREAM | Freq: Every day | CUTANEOUS | 0 refills | Status: AC

## 2023-10-28 NOTE — Patient Instructions (Signed)
 Apply a small amount of silvadene to the ulcer on the left foot and cover with a bandage. Monitor for any signs/symptoms of infection. Call the office immediately if any occur or go directly to the emergency room. Call with any questions/concerns.

## 2023-10-30 NOTE — Progress Notes (Signed)
  Subjective:  Patient ID: Sarah Gilbert, female    DOB: 1946-04-06,  MRN: [id number]  Chief Complaint  Patient presents with   Foot Pain    RM#13 Patient presents today with lesion on left foot and dryness on right heel has been using moisturizer and not helping.          Sarah Gilbert is a 78 year old female presents today with the above concerns.  She said that he still has a painful lesion inside of her left foot as well as dryness of the right heel.  She applies moisturizer intermittently.  She has not seen any drainage or pus any open lesions.  No fever or chills.  No other concerns.    Objective:    Physical Exam          General: AAO x3, NAD  Dermatological: There is a thick hyperkeratotic lesion noted to the left fifth metatarsal base.  Once I debrided this today there was an ulceration present measuring 0.6 x 0.2 x 0.1 cm.  Was not able to measure this prior to debridement.  There is no probing to [place] or tunneling.  There is no surrounding erythema, ascending cellulitis.  There is no fluctuation, crepitation, malodor.  Hyperkeratotic tissue to the posterior aspect the right heel without any underlying ulceration or signs of infection.  No other open lesions identified.  Vascular: Dorsalis Pedis artery and Posterior Tibial artery pedal pulses are palpable bilateral with immedate capillary fill time. There is no pain with calf compression, swelling, warmth, erythema.   Neruologic: Grossly intact via light touch bilateral.  Musculoskeletal: Tenderness to the left skin lesion but no other areas of discomfort.  Gait: Unassisted, Nonantalgic.   No images are attached to the encounter.    Results          Assessment:   Ulceration left foot  Plan:  Patient was evaluated and treated and all questions answered.  Assessment and Plan    Left foot ulcer -Medically necessary wound debridement was performed today.  Sharply debrided the wound utilizing a #[id number]  with scalpel and healthy, granular tissue to remove nonviable, devitalized tissue in order to promote wound healing.  There is no blood loss.  Cleaned the area saline.  Silvadene applied followed by dressing.  Continue daily dressing changes, offloading. -Monitor for any clinical signs or symptoms of infection and directed to call the office immediately should any occur or go to the ER.  Right foot pre-ulcerative callus -Sharply pared lesion without any complications or bleeding as a courtesy.  Encouraged moisturizer, offloading.  Previously prescribed AmLactin which she has not been using we will continue this for now but if no improvement consider switching to a urea-based cream.     Vivi Barrack DPM

## 2023-11-15 ENCOUNTER — Other Ambulatory Visit: Payer: Self-pay | Admitting: Nurse Practitioner

## 2023-11-15 DIAGNOSIS — E559 Vitamin D deficiency, unspecified: Secondary | ICD-10-CM

## 2023-11-18 ENCOUNTER — Telehealth: Payer: Self-pay | Admitting: Adult Health

## 2023-11-18 ENCOUNTER — Ambulatory Visit: Admitting: Podiatry

## 2023-11-18 ENCOUNTER — Encounter: Payer: Self-pay | Admitting: Podiatry

## 2023-11-18 DIAGNOSIS — L853 Xerosis cutis: Secondary | ICD-10-CM | POA: Diagnosis not present

## 2023-11-18 DIAGNOSIS — L84 Corns and callosities: Secondary | ICD-10-CM

## 2023-11-18 DIAGNOSIS — L97522 Non-pressure chronic ulcer of other part of left foot with fat layer exposed: Secondary | ICD-10-CM

## 2023-11-18 NOTE — Telephone Encounter (Signed)
 Called and relayed information to daughter Macon Large and stated. "I would recommend that patient follow-up with PCP as soon as possible, however, if there is an acute or alarming change in her behavior or mental status, I would recommend that she be taken to the emergency room." Pt daughter voiced gratitude and understanding of all discussed.

## 2023-11-18 NOTE — Telephone Encounter (Signed)
 Spoke w/Pt goddaughter who had called w/concerns regarding Pt having episodes of "going back in time." She stated Pt will talk about family members who are no longer alive as if they are still here. Stated Pt uncle has recently been sick and in the hospital and wondering if that is why Pt is going back. Stated this is going on the 3rd week that Pt has episodes of not being in present time or not remembering where she is. Stated Pt has these episodes 2 or 3 times/week. Stated Pt had a podiatry appt today and Pt was able to answer questions appropriately but after visit when Pt left the office, Pt had no recollection of being at the appt and did not know where she was. Goddaughter stated after Pt was home she ate breakfast and started making sense and asked if she went to her appt with the "foot doctor" and was it a good visit. Pt does not have a dx of diabetes but asked if any of these episodes had occurred when Pt had not eaten. She stated yes that several of them had occurred prior to Pt eating and maybe four had occurred after Pt had eaten. Advised will speak with a provider and call back to make her aware if there are any recommendations. Goddaughter also stated Pt had not been taking the most recent medications Shanda Bumps had prescribed because of cost.

## 2023-11-18 NOTE — Progress Notes (Signed)
  Subjective:  Patient ID: Carletta Feasel, female    DOB: [birth year],  MRN: [id number]  Chief Complaint  Patient presents with   Foot Ulcer    RM#11 Follow up on left foot ulcer looks to be healing but patient states is in a lot pain all the time worse with pressure .    Discussed the use of AI scribe software for clinical note transcription with the patient, who gave verbal consent to proceed.  History of Present Illness The patient, with a history of a foot wound, presents with ongoing pain in the left foot. The discomfort is localized to the side of the foot where the wound is located. The patient and caregiver report consistent use of a sock throughout the day, with intermittent application of a prescribed ointment. The patient also reports a hard heel on the right, which is attributed to inconsistent use of a prescribed cream. The patient denies any drainage from the wound or systemic symptoms such as fevers or chills.      Objective:    Physical Exam General: AAO x3, NAD  Dermatological: On the posterior aspect the right heel with hyperkeratotic tissue without any underlying ulceration, drainage or signs of infection on the left foot fifth metatarsal base area of hyperkeratotic tissue with a skin fissure type lesion underneath this.  After debrided thick callus is able to identify the ulcer which measures 0.5 x 0.1 x 0.1 cm without any probing to bone, undermining or tunneling. Unable to measure the wound prior to debridement.  Dry skin present to the feet laterally.  Vascular: Dorsalis Pedis artery and Posterior Tibial artery pedal pulses are palpable bilateral with immedate capillary fill time. There is no pain with calf compression, swelling, warmth, erythema.   Neruologic: Grossly intact via light touch bilateral.  Musculoskeletal: She has tenderness some right posterior heel and the callus as well as left metatarsal base.  No other areas of discomfort.  Gait: Unassisted,  Nonantalgic.         Results Procedure: Wound cleaning Description: Cleaned the wound on the left foot. Observed that the wound is almost completely healed with a small opening remaining.  Procedure: Callus debridement Description: Debrided callus on the right heel.   Assessment:   1. Ulcer of left foot with fat layer exposed (HCC)   2.      Callus right heel   Plan:  Patient was evaluated and treated and all questions answered.  Assessment and Plan Assessment & Plan Left foot wound Wound nearly healed, resembling a skin crack. No signs of infection. -  There is no blood loss.  She tolerated well.  I cleaned the area and antibiotic ointment Medically necessary wound debridements performed today.  Sharply debrided hyperkeratotic lesion to reveal the underlying ulceration.  I sharply debrided the wound with a #312 with scalpel down to healthy, granular tissue.  Clean the area.  Antibiotic ointment was applied followed by dressing. -Continue Silvadene dressing changes daily. - Monitor for infection signs: swelling, redness, drainage. - Follow-up in three weeks.  Dry skin on left foot To be addressed post-wound healing with moisturizers. - Use moisturizers once wound is healed.    Callus on right heel Requires consistent moisturizing. Non-compliance noted. Epsom salt soaks may help. - Apply moisturizer daily. - Consider Epsom salt soaks, ensure thorough drying. - Debride callus as needed.   Return in about 3 weeks (around 12/09/2023) for left foot ulcer.   Vivi Barrack DPM

## 2023-11-18 NOTE — Telephone Encounter (Signed)
 I am actually not her primary neurologist, I have not seen her since 2023 and I saw her for sleep apnea only.  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 I would recommend that patient follow-up with PCP as soon as possible, however, if there is an acute or alarming change in her behavior or mental status, I would recommend that she be taken to the emergency room.

## 2023-11-18 NOTE — Telephone Encounter (Signed)
 Pt's Goddaughter has concerns about episodes of pt's memory going in and out , there is a concern that pt may have had another mini stroke.  Pt has not been to ED, phone rep did advise if there are signs and concerns that pt has had a stroke of any kind they should have pt taken to ED without hesitation. Goddaughter  is still asking to be called.

## 2023-12-02 ENCOUNTER — Telehealth: Payer: Self-pay | Admitting: Adult Health

## 2023-12-02 ENCOUNTER — Encounter: Payer: Self-pay | Admitting: Nurse Practitioner

## 2023-12-02 ENCOUNTER — Ambulatory Visit (INDEPENDENT_AMBULATORY_CARE_PROVIDER_SITE_OTHER): Payer: Medicare Other | Admitting: Nurse Practitioner

## 2023-12-02 VITALS — BP 130/78 | HR 64 | Temp 97.6°F | Ht 65.0 in | Wt 197.0 lb

## 2023-12-02 DIAGNOSIS — I119 Hypertensive heart disease without heart failure: Secondary | ICD-10-CM

## 2023-12-02 DIAGNOSIS — I482 Chronic atrial fibrillation, unspecified: Secondary | ICD-10-CM | POA: Insufficient documentation

## 2023-12-02 DIAGNOSIS — E782 Mixed hyperlipidemia: Secondary | ICD-10-CM | POA: Diagnosis not present

## 2023-12-02 DIAGNOSIS — Z0000 Encounter for general adult medical examination without abnormal findings: Secondary | ICD-10-CM | POA: Diagnosis not present

## 2023-12-02 DIAGNOSIS — E66811 Obesity, class 1: Secondary | ICD-10-CM

## 2023-12-02 DIAGNOSIS — E559 Vitamin D deficiency, unspecified: Secondary | ICD-10-CM

## 2023-12-02 DIAGNOSIS — E6609 Other obesity due to excess calories: Secondary | ICD-10-CM

## 2023-12-02 DIAGNOSIS — R7303 Prediabetes: Secondary | ICD-10-CM | POA: Insufficient documentation

## 2023-12-02 DIAGNOSIS — R4189 Other symptoms and signs involving cognitive functions and awareness: Secondary | ICD-10-CM

## 2023-12-02 DIAGNOSIS — Z6832 Body mass index (BMI) 32.0-32.9, adult: Secondary | ICD-10-CM

## 2023-12-02 DIAGNOSIS — Z940 Kidney transplant status: Secondary | ICD-10-CM

## 2023-12-02 DIAGNOSIS — R82998 Other abnormal findings in urine: Secondary | ICD-10-CM

## 2023-12-02 DIAGNOSIS — I69354 Hemiplegia and hemiparesis following cerebral infarction affecting left non-dominant side: Secondary | ICD-10-CM

## 2023-12-02 DIAGNOSIS — Z79899 Other long term (current) drug therapy: Secondary | ICD-10-CM

## 2023-12-02 LAB — POCT URINALYSIS DIP (CLINITEK)
Bilirubin, UA: NEGATIVE
Glucose, UA: NEGATIVE mg/dL
Ketones, POC UA: NEGATIVE mg/dL
Nitrite, UA: NEGATIVE
POC PROTEIN,UA: NEGATIVE
Spec Grav, UA: 1.015 (ref 1.010–1.025)
Urobilinogen, UA: 0.2 U/dL
pH, UA: 7 (ref 5.0–8.0)

## 2023-12-02 NOTE — Telephone Encounter (Signed)
 PCP sent message via secure chat as she had follow-up with patient this morning and family noted concern of high co-pay with memantine (reporting $300/month).  There was also a phone call from family on 4/3 due to confusional episodes and family also mentioned at that time high cost for medication, she was advised to follow-up with PCP by Dr. Omar Bibber for confusion but no f/u or mention regarding high cost of medication. Can you please follow up with patient/family? Unclear why the cost is so high, not sure if this was just with the titration pack and possibly not being covered by insurance or possibly patient has deductible that is needing to be met? If prescription even for 10 mg tablets are high cost, can consider sending prescription to [facility] or [facility] to use [facility] where a 30-day prescription is less than $20.

## 2023-12-02 NOTE — Telephone Encounter (Signed)
 Cld Pt dtr. No answer, LVM for call back.

## 2023-12-02 NOTE — Progress Notes (Signed)
 Del Favia, CMA,acting as a Neurosurgeon for Sarah Epley, FNP.,have documented all relevant documentation on the behalf of Sarah Epley, FNP,as directed by  Sarah Epley, FNP while in the presence of Sarah Epley, FNP.  Subjective:    Patient ID: Sarah Gilbert , female    DOB: 1945-11-14 , 78 y.o.   MRN: [id number]  Chief Complaint  Patient presents with   Annual Exam    HPI  Patient presents today for HM, Patient reports compliance with medication. Patient denies any chest pain, SOB, or headaches.   Patient's daughter - kim reports she believes her short term memory is getting worse. She reports she is sometimes here then she goes back in time. Her symptoms have worsened over the last 3 weeks. She is established with a neurologist but they directed her to come here for evaluation she saw them in march. She was to start Namenda  but the cost was too much $380.   She has been drinking water adequately. Her uncle had been in and out of the hospital she was stressing and caused her memory to change.   She is 10 years of celebrating a double kidney transplant      Past Medical History:  Diagnosis Date   Anemia    Arthritis    Atrial fibrillation (HCC)    Bleeding nose    history of    ESRD (end stage renal disease) (HCC)    History of bronchitis    Hyperlipemia    Hypertension    Joint inflammation    Obesity    PONV (postoperative nausea and vomiting)    Rotator cuff tear arthropathy of right shoulder 06/18/2015   Thyroid disease    Wears glasses      Family History  Problem Relation Age of Onset   Dementia Mother    Cancer Father    Breast cancer Neg Hx    Sleep apnea Neg Hx      Current Outpatient Medications:    amLODipine  (NORVASC ) 10 MG tablet, Take 5 mg by mouth daily. , Disp: , Rfl:    ammonium lactate  (AMLACTIN) 12 % cream, Apply 1 Application topically as needed for dry skin., Disp: 385 g, Rfl: 0   apixaban  (ELIQUIS ) 5 MG TABS tablet, Take 1 tablet (5  mg total) by mouth 2 (two) times daily., Disp: 60 tablet, Rfl: 2   atorvastatin  (LIPITOR) 40 MG tablet, Take 1 tablet (40 mg total) by mouth daily., Disp: 30 tablet, Rfl: 2   furosemide  (LASIX ) 40 MG tablet, Take 40 mg by mouth 2 (two) times daily., Disp: , Rfl:    losartan (COZAAR) 25 MG tablet, Take 25 mg by mouth daily., Disp: , Rfl:    MAGNESIUM  PO, Take 1 tablet by mouth in the morning and at bedtime., Disp: , Rfl:    memantine  (NAMENDA  TITRATION PAK) tablet pack, 5 mg/day for =1 week; 5 mg twice daily for =1 week; 15 mg/day given in 5 mg and 10 mg separated doses for =1 week; then 10 mg twice daily, Disp: 49 tablet, Rfl: 0   memantine  (NAMENDA ) 10 MG tablet, Take 1 tablet (10 mg total) by mouth 2 (two) times daily., Disp: 60 tablet, Rfl: 5   metoprolol  (LOPRESSOR ) 50 MG tablet, Take 50 mg by mouth 2 (two) times daily., Disp: , Rfl:    multivitamin (RENA-VIT) TABS tablet, Take 1 tablet by mouth daily., Disp: , Rfl:    mycophenolate  (CELLCEPT ) 250 MG capsule, Take 250 mg by mouth  2 (two) times daily., Disp: , Rfl:    nystatin  cream (MYCOSTATIN ), Apply 1 application topically 2 (two) times daily. (Patient taking differently: Apply 1 Application topically 2 (two) times daily as needed (rash). When able to remember), Disp: 30 g, Rfl: 2   pantoprazole  (PROTONIX ) 40 MG tablet, Take 40 mg by mouth daily. , Disp: , Rfl:    potassium chloride  SA (K-DUR,KLOR-CON ) 20 MEQ tablet, Take 20 mEq by mouth 2 (two) times daily., Disp: , Rfl:    silver  sulfADIAZINE  (SILVADENE ) 1 % cream, Apply 1 Application topically daily., Disp: 50 g, Rfl: 0   tacrolimus  (PROGRAF ) 0.5 MG capsule, Take 0.5 mg by mouth every evening., Disp: , Rfl:    tacrolimus  (PROGRAF ) 1 MG capsule, Take 2 mg by mouth 2 (two) times daily., Disp: , Rfl:    Vitamin D , Ergocalciferol , (DRISDOL ) 1.25 MG (50000 UNIT) CAPS capsule, TAKE 1 CAPSULE BY MOUTH TWICE A WEEK ON THURSDAY AND SUNDAY, Disp: 24 capsule, Rfl: 1   Allergies  Allergen Reactions    Vancomycin  Hives, Itching, Rash and Other (See Comments)    Blistering, swelling, and peeling of feet.  Pt denies any similar reactions around face or neck.   Iohexol       Desc: iodine contrast/per dialysis center, Onset Date: [id number]       The patient states she uses status post hysterectomy for birth control. No LMP recorded. Patient has had a hysterectomy.. Negative for Dysmenorrhea and Negative for Menorrhagia. Negative for: breast discharge, breast lump(s), breast pain and breast self exam. Associated symptoms include abnormal vaginal bleeding. Pertinent negatives include abnormal bleeding (hematology), anxiety, decreased libido, depression, difficulty falling sleep, dyspareunia, history of infertility, nocturia, sexual dysfunction, sleep disturbances, urinary incontinence, urinary urgency, vaginal discharge and vaginal itching. Diet regular; she does try to monitor her intake of sugary foods and drinks. The patient states her exercise level is minimal - 2 days a week - when she has to go to the doctor or grocery store. Her cousin/goddaughter advises she does not go down any hills.  The patient's tobacco use is:  Social History   Tobacco Use  Smoking Status Never  Smokeless Tobacco Never   She has been exposed to passive smoke. The patient's alcohol use is:  Social History   Substance and Sexual Activity  Alcohol Use No    Review of Systems  Constitutional: Negative.   HENT: Negative.    Eyes: Negative.   Respiratory: Negative.    Cardiovascular: Negative.  Negative for chest pain, palpitations and leg swelling.  Gastrointestinal:  Negative for diarrhea.  Endocrine: Negative.  Negative for polydipsia, polyphagia and polyuria.  Genitourinary: Negative.   Musculoskeletal: Negative.   Skin:  Negative for rash.       Left posterior back with soft mass she feels like slightly larger  Allergic/Immunologic: Negative.   Neurological: Negative.  Negative for dizziness and  headaches.  Hematological: Negative.   Psychiatric/Behavioral: Negative.       Today's Vitals   12/02/23 0939  BP: 130/78  Pulse: 64  Temp: 97.6 F (36.4 C)  TempSrc: Oral  Weight: 197 lb (89.4 kg)  Height: 5\' 5"  (1.651 m)  PainSc: 0-No pain   Body mass index is 32.78 kg/m.  Wt Readings from Last 3 Encounters:  12/02/23 197 lb (89.4 kg)  10/25/23 184 lb (83.5 kg)  07/07/23 190 lb 12.8 oz (86.5 kg)     Objective:  Physical Exam Vitals and nursing note reviewed.  Constitutional:  General: She is not in acute distress.    Appearance: Normal appearance. She is well-developed. She is obese.  HENT:     Head: Normocephalic and atraumatic.     Right Ear: Hearing, tympanic membrane, ear canal and external ear normal. There is no impacted cerumen.     Left Ear: Hearing, tympanic membrane, ear canal and external ear normal. There is no impacted cerumen.     Nose: Nose normal.     Mouth/Throat:     Mouth: Mucous membranes are moist.  Eyes:     General: Lids are normal.     Conjunctiva/sclera: Conjunctivae normal.     Funduscopic exam:    Right eye: No papilledema.        Left eye: No papilledema.  Neck:     Thyroid: No thyroid mass.     Vascular: No carotid bruit.  Cardiovascular:     Rate and Rhythm: Normal rate and regular rhythm.     Pulses: Normal pulses.     Heart sounds: Normal heart sounds. No murmur heard. Pulmonary:     Effort: Pulmonary effort is normal. No respiratory distress.     Breath sounds: Normal breath sounds. No wheezing.  Chest:     Chest wall: No mass.  Breasts:    Right: Normal. No swelling, mass, nipple discharge or tenderness.     Left: Normal. No swelling, mass, nipple discharge or tenderness.  Abdominal:     General: Abdomen is flat. Bowel sounds are normal.     Palpations: Abdomen is soft. There is no mass.     Tenderness: There is no abdominal tenderness.     Comments: Palpable areas to abdomen where she had her kidney transplant.   Musculoskeletal:        General: No swelling. Normal range of motion.     Cervical back: Full passive range of motion without pain, normal range of motion and neck supple.     Right lower leg: No edema.     Left lower leg: No edema.  Skin:    General: Skin is warm and dry.     Capillary Refill: Capillary refill takes less than 2 seconds.     Findings: No rash.       Neurological:     General: No focal deficit present.     Mental Status: She is alert and oriented to person, place, and time.     Cranial Nerves: No cranial nerve deficit.     Sensory: No sensory deficit.     Motor: No weakness.  Psychiatric:        Mood and Affect: Mood normal.        Behavior: Behavior normal.        Thought Content: Thought content normal.        Judgment: Judgment normal.      Assessment And Plan:     Encounter for annual health examination Assessment & Plan: Behavior modifications discussed and diet history reviewed.   Pt will continue to exercise regularly and modify diet with low GI, plant based foods and decrease intake of processed foods.  Recommend intake of daily multivitamin, Vitamin D , and calcium .  Recommend mammogram for preventive screenings, as well as recommend immunizations that include influenza, TDAP, and Shingles    Mixed hyperlipidemia Assessment & Plan: Chronic, stable. Continue statin.   Orders: -     Lipid panel  Benign hypertensive heart disease without heart failure Assessment & Plan: Blood pressure is fairly controlled.  Continue  current medications.  Will check eGFR. EKG done with atrial fibrillation HR 62  Orders: -     EKG 12-Lead -     POCT URINALYSIS DIP (CLINITEK) -     Microalbumin / creatinine urine ratio -     CMP14+EGFR  Prediabetes Assessment & Plan: Chronic, stable.  Continue focusing on healthy diet.  Orders: -     Hemoglobin A1c  Vitamin D  deficiency Assessment & Plan: Will check vitamin D  level and supplement as needed.    Also  encouraged to spend 15 minutes in the sun daily.    Orders: -     VITAMIN D  25 Hydroxy (Vit-D Deficiency, Fractures)  Chronic atrial fibrillation, unspecified (HCC) Assessment & Plan: Continue Eliquis    Hemiparesis affecting left side as late effect of cerebrovascular accident [facility]) Assessment & Plan: Stable.  Uses a cane intermittently   Cognitive impairment Assessment & Plan: Her family member and the patient is concerned that she is having memory problems.  I did explain related to her stroke she may have some memory problems.  She established with a neurologist I have advised them to contact them for further evaluation.  Orders: -     Vitamin B12  Leukocytes in urine Assessment & Plan: Will send urine for culture.  Orders: -     Urine Culture  Class 1 obesity due to excess calories with body mass index (BMI) of 32.0 to 32.9 in adult, unspecified whether serious comorbidity present Assessment & Plan: She is encouraged to strive for BMI less than 30 to decrease cardiac risk. Advised to aim for at least 150 minutes of exercise per week.    Other long term (current) drug therapy -     CBC with Differential/Platelet  Renal transplant recipient Assessment & Plan: Continue f/u with Renal transplant team yearly      Return for 1 year physical, 6 month bp check. Patient was given opportunity to ask questions. Patient verbalized understanding of the plan and was able to repeat key elements of the plan. All questions were answered to their satisfaction.   Sarah Epley, FNP  I, Sarah Epley, FNP, have reviewed all documentation for this visit. The documentation on 12/02/23 for the exam, diagnosis, procedures, and orders are all accurate and complete.

## 2023-12-07 NOTE — Telephone Encounter (Signed)
 Pt dtr returned call from 4/17. Discussed that NP is unsure why out of pocket cost for memantine  is so high. Asked Dtr if pharmacy had all of Pt insurance info for prescriptions. Also informed dtr that NP recommended [facility] to help with cost. Pt pharmacy is [facility], typically they accept Good[facility] but also informed dtr that [employer] and [facility] use [facility] where a 30-day prescription can cost of memantine  is less than $20. Dtr stated she is unsure if [facility] has all the insurance information needed but she will go to the pharmacy and discuss the cost with them and inquire if they use Good[facility]. Dtr stated she will let our office know the outcome.

## 2023-12-12 NOTE — Assessment & Plan Note (Signed)
 Continue Eliquis

## 2023-12-12 NOTE — Assessment & Plan Note (Signed)
 Will check vitamin D level and supplement as needed.    Also encouraged to spend 15 minutes in the sun daily.

## 2023-12-12 NOTE — Assessment & Plan Note (Signed)
 Will send urine for culture

## 2023-12-12 NOTE — Assessment & Plan Note (Signed)
Behavior modifications discussed and diet history reviewed.   Pt will continue to exercise regularly and modify diet with low GI, plant based foods and decrease intake of processed foods.  Recommend intake of daily multivitamin, Vitamin D, and calcium.  Recommend mammogram for preventive screenings, as well as recommend immunizations that include influenza, TDAP, and Shingles

## 2023-12-12 NOTE — Assessment & Plan Note (Signed)
 Her family member and the patient is concerned that she is having memory problems.  I did explain related to her stroke she may have some memory problems.  She established with a neurologist I have advised them to contact them for further evaluation.

## 2023-12-12 NOTE — Assessment & Plan Note (Addendum)
 Blood pressure is fairly controlled.  Continue current medications.  Will check eGFR. EKG done with atrial fibrillation HR 62

## 2023-12-12 NOTE — Assessment & Plan Note (Signed)
 Chronic, stable.  Continue focusing on healthy diet.

## 2023-12-12 NOTE — Assessment & Plan Note (Signed)
 Chronic, stable.  Continue statin.

## 2023-12-12 NOTE — Assessment & Plan Note (Signed)
 She is encouraged to strive for BMI less than 30 to decrease cardiac risk. Advised to aim for at least 150 minutes of exercise per week.

## 2023-12-12 NOTE — Assessment & Plan Note (Signed)
 Continue f/u with Renal transplant team yearly

## 2023-12-12 NOTE — Assessment & Plan Note (Signed)
 Stable.  Uses a cane intermittently

## 2023-12-14 ENCOUNTER — Ambulatory Visit: Admitting: Podiatry

## 2023-12-19 IMAGING — MG MM DIGITAL SCREENING BILAT W/ TOMO AND CAD
6 of 10 series · 6 of 30 positions shown · non-contrast
Comparison: Previous exam(s).

CLINICAL DATA: Screening.

EXAM:
DIGITAL SCREENING BILATERAL MAMMOGRAM WITH TOMOSYNTHESIS AND CAD
TECHNIQUE: Bilateral screening digital craniocaudal and mediolateral oblique
mammograms were obtained. Bilateral screening digital breast
tomosynthesis was performed. The images were evaluated with
computer-aided detection.

[R MLO synth-2D (1 of 2)]
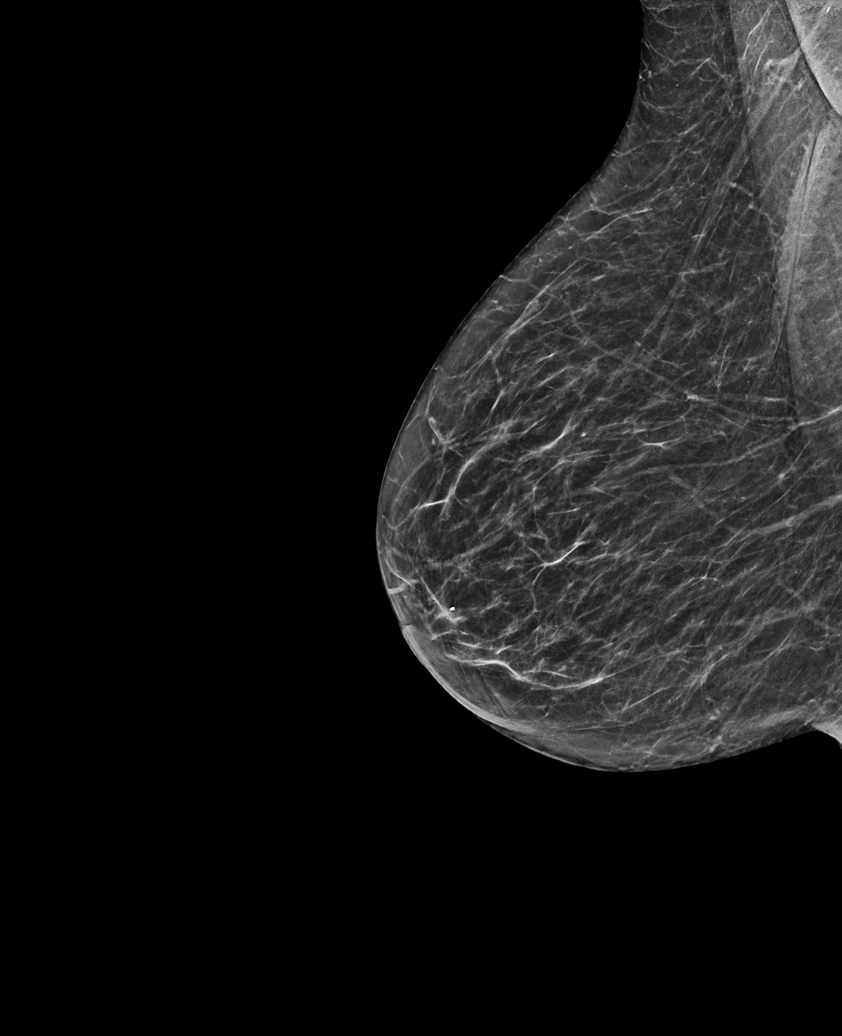

[R MLO synth-2D (2 of 2)]
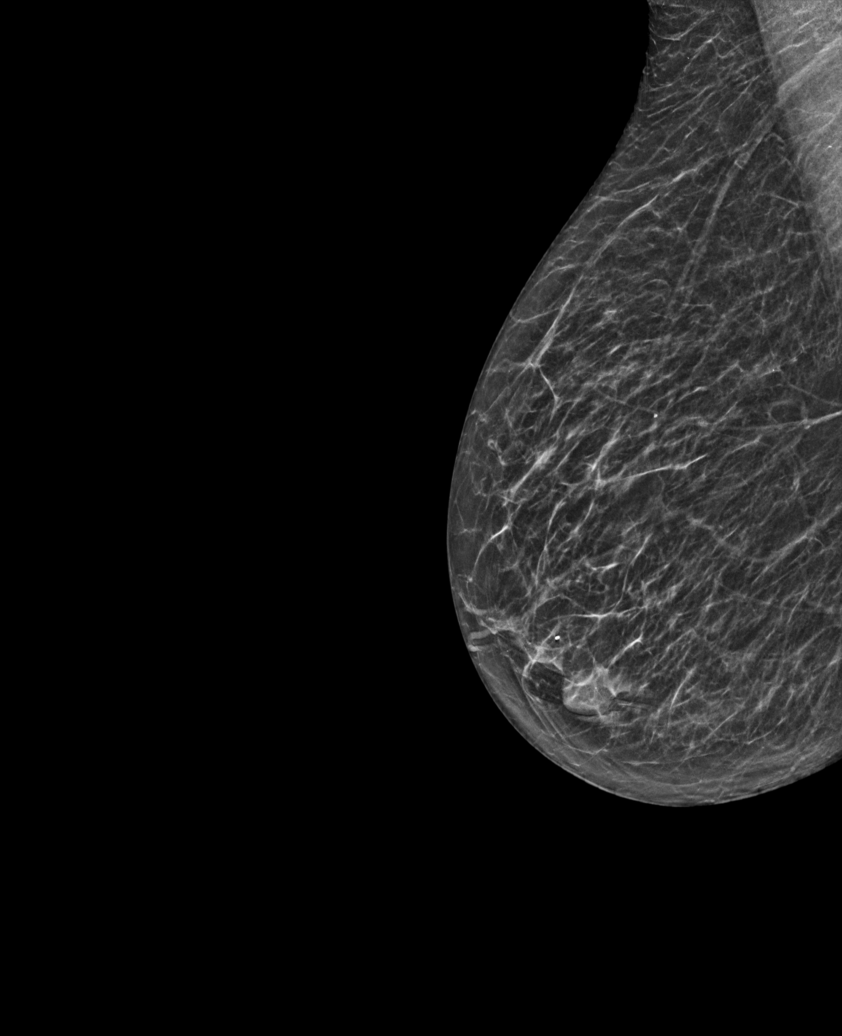

[R CC synth-2D]
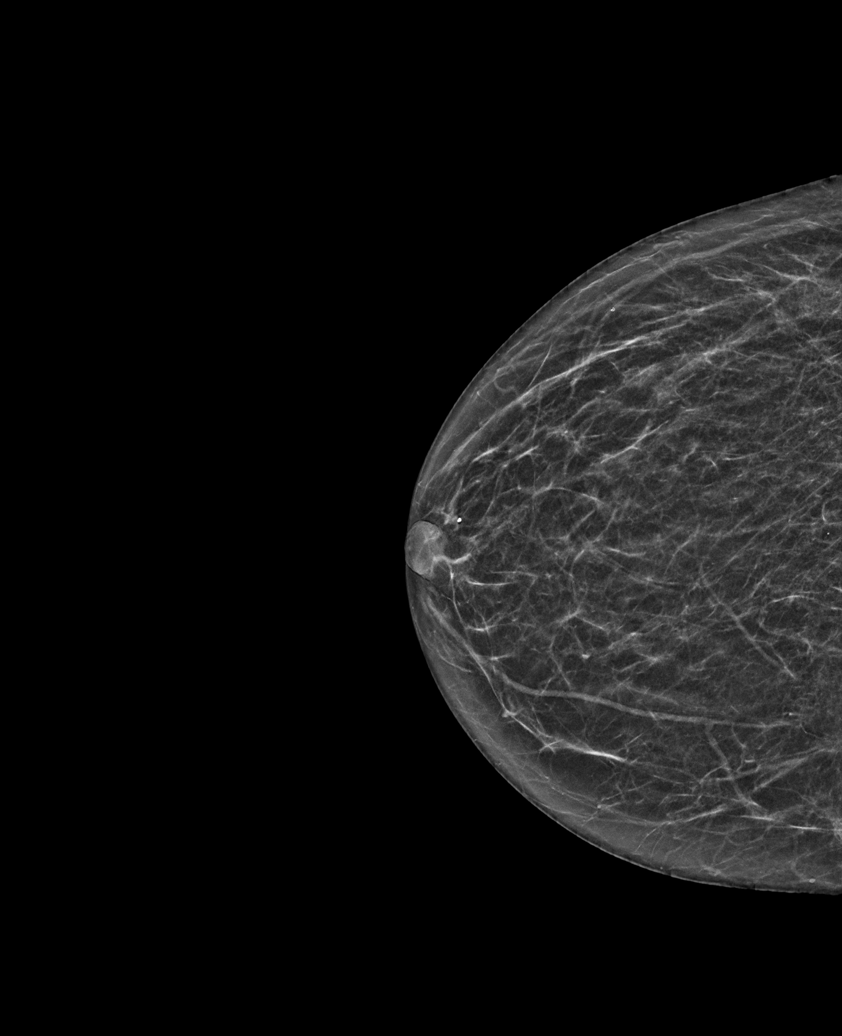

[L CC synth-2D]
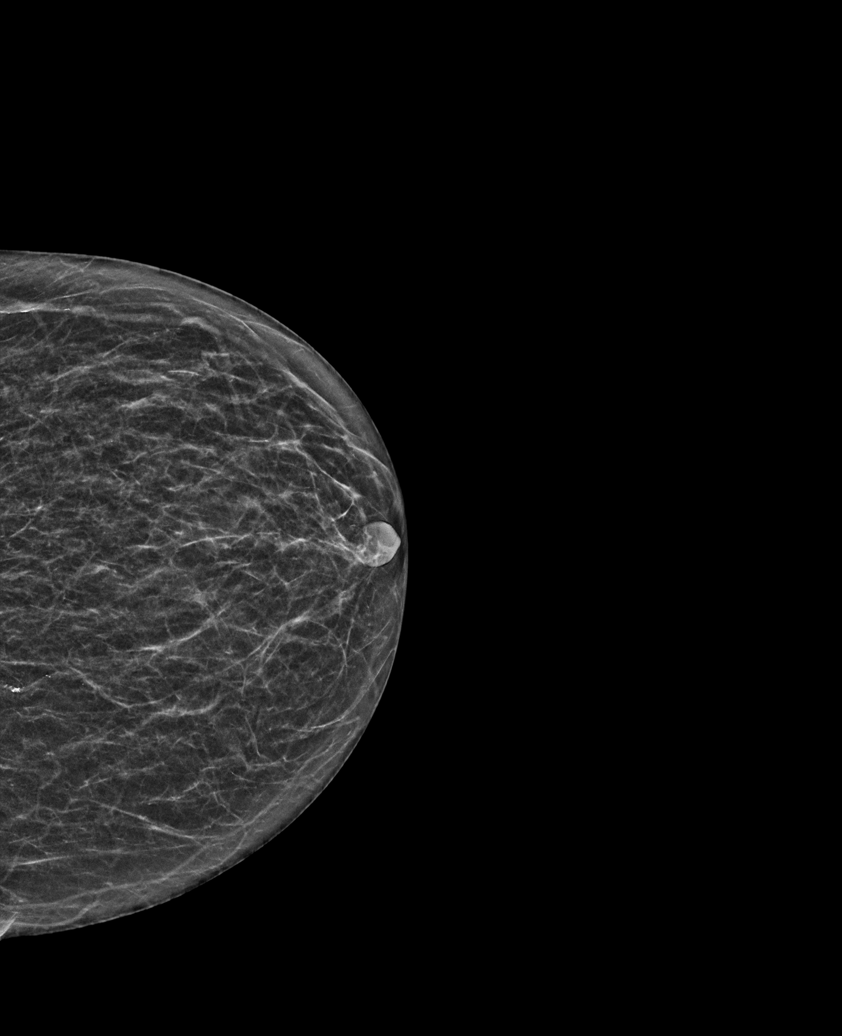

[L MLO synth-2D]
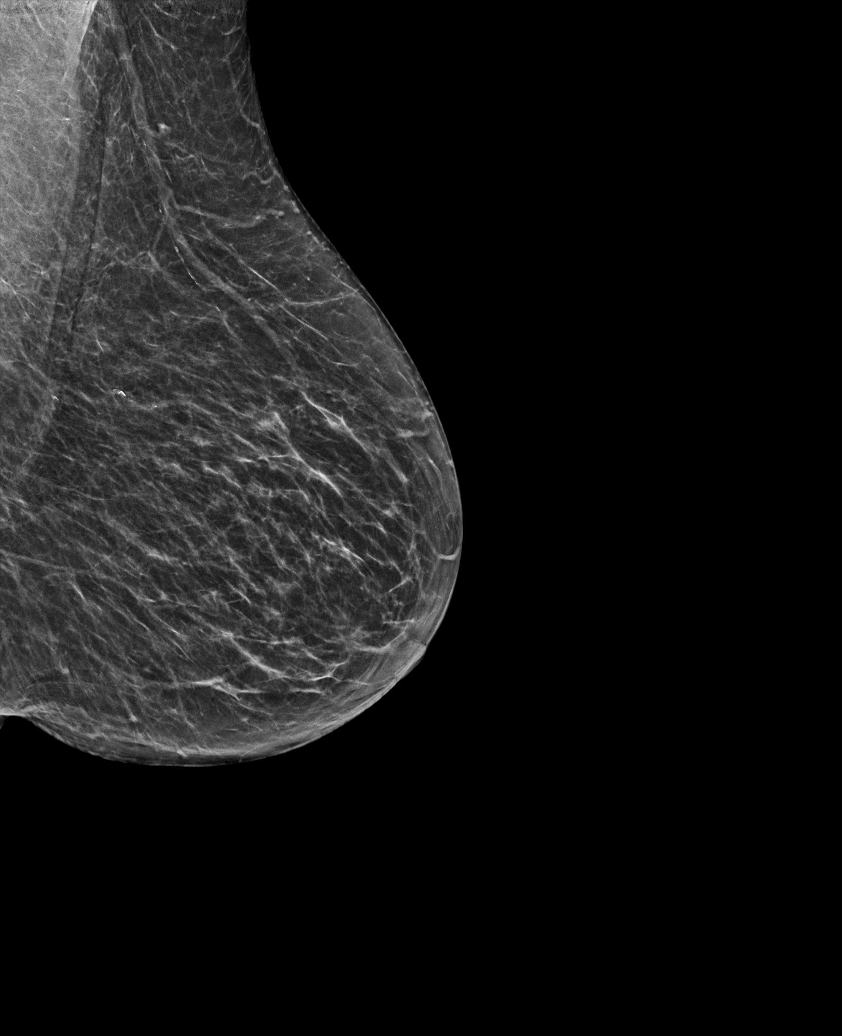

[R MLO tomo · tomo slice 27/53.0]
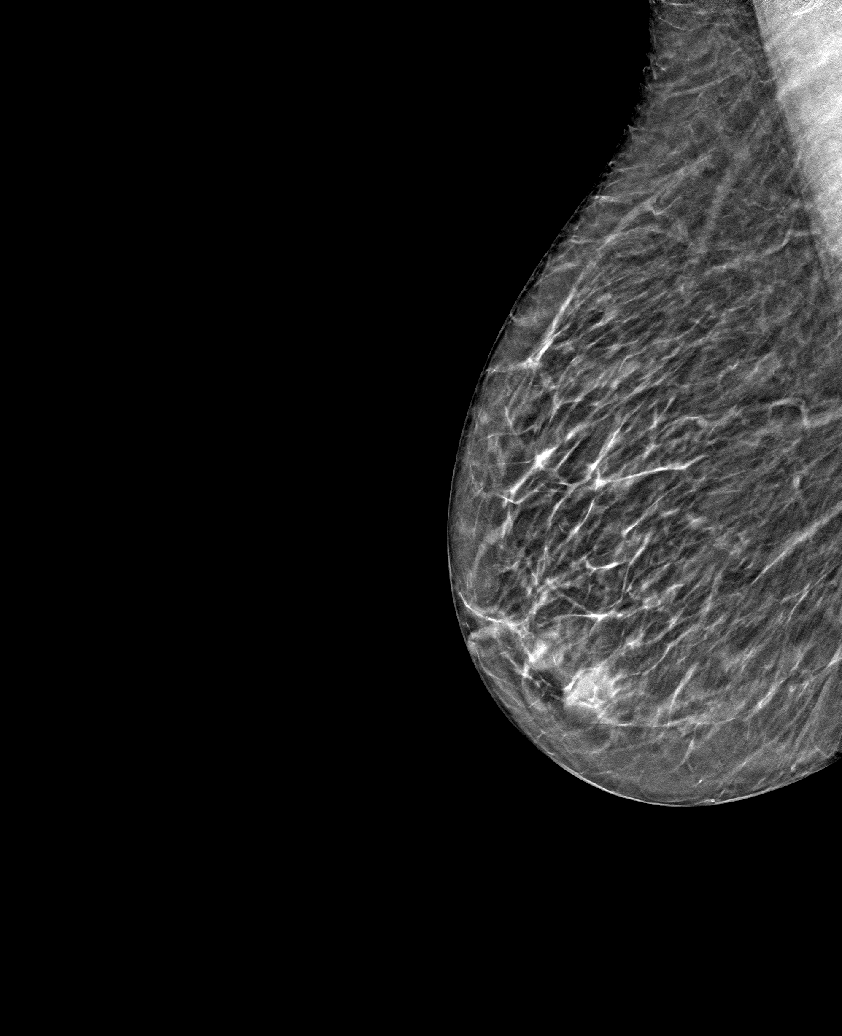

[6 of 30 positions shown; findings below may reference images not displayed]

ACR Breast Density Category b: There are scattered areas of
fibroglandular density.
FINDINGS: There are no findings suspicious for malignancy.
IMPRESSION: No mammographic evidence of malignancy. A result letter of this
screening mammogram will be mailed directly to the patient.

RECOMMENDATION:
Screening mammogram in one year. (Code:51-O-LD2)

BI-RADS CATEGORY  1: Negative.

## 2023-12-20 ENCOUNTER — Ambulatory Visit: Admitting: Podiatry

## 2023-12-20 ENCOUNTER — Encounter: Payer: Self-pay | Admitting: Podiatry

## 2023-12-20 DIAGNOSIS — L84 Corns and callosities: Secondary | ICD-10-CM

## 2023-12-22 NOTE — Progress Notes (Signed)
  Subjective:  Patient ID: Sarah Gilbert, female    DOB: 1946/05/06,  MRN: [id number] Chief Complaint  Patient presents with   Foot Ulcer    RM#13 Follow up on foot ulcers patient states just need ulcers evaluated.    History of Present Illness The patient, with a history of a foot wound, presents with ongoing pain in the left foot.  She has been keeping moisturizer on the area.  Does not see any drainage or pus.  Increased swelling redness.  Erythematous Tender to left fifth metatarsal base that she points to.  She also has a callus on the back of the right heel but is not causing significant pain.  No skin breakdown of the right foot.      Objective:    Physical Exam General: AAO x3, NAD  Dermatological: On the posterior aspect the right heel with hyperkeratotic tissue without any underlying ulceration, drainage or signs of infection.  On the left foot fifth metatarsal base is a preulcerative callus.  Upon debridement there is no definitive skin breakdown today.  There is no edema, erythema, drainage or pus or any obvious signs of infection today.  There is no open lesions identified bilaterally.  Vascular: Dorsalis Pedis artery and Posterior Tibial artery pedal pulses are palpable bilateral with immedate capillary fill time. There is no pain with calf compression, swelling, warmth, erythema.   Neruologic: Grossly intact via light touch bilateral.  Musculoskeletal: She has tenderness to the fifth metatarsal base of the left foot along the area the preulcerative callus.       Assessment:   Preulcerative calluses  Plan:  Patient was evaluated and treated and all questions answered.  Assessment and Plan Assessment & Plan Left foot wound -Wound is resolved.  Sharply debrided hyperkeratotic tissue with any complications or bleeding.  Although there is no skin breakdown recommended continue with moisturizer, offloading.  Discussed she is to avoid excess  pressure.  Preulcerative callus right foot -Sharply debrided without any complications or bleeding.  Moisturizer, offloading.  Offloading.  Monitor for any clinical signs or symptoms of infection and directed to call the office immediately should any occur or go to the ER.  Return in about 6 weeks (around 01/31/2024) for nail trim, pre-ulcerative calluses.  Charity Conch DPM

## 2024-01-18 NOTE — Progress Notes (Unsigned)
 [place], CMA,acting as a Neurosurgeon for [name], FNP.,have documented all relevant documentation on the behalf of [name], FNP,as directed by  [name], FNP while in the presence of [name], FNP.  Subjective:  Patient ID: Sarah Gilbert , female    DOB: May 30, 1946 , 79 y.o.   MRN: [id number]  No chief complaint on file.   HPI  HPI   Past Medical History:  Diagnosis Date   Anemia    Arthritis    Atrial fibrillation (HCC)    Bleeding nose    history of    ESRD (end stage renal disease) (HCC)    History of bronchitis    Hyperlipemia    Hypertension    Joint inflammation    Obesity    PONV (postoperative nausea and vomiting)    Rotator cuff tear arthropathy of right shoulder 06/18/2015   Thyroid disease    Wears glasses      Family History  Problem Relation Age of Onset   Dementia Mother    Cancer Father    Breast cancer Neg Hx    Sleep apnea Neg Hx      Current Outpatient Medications:    amLODipine  (NORVASC ) 10 MG tablet, Take 5 mg by mouth daily. , Disp: , Rfl:    ammonium lactate  (AMLACTIN) 12 % cream, Apply 1 Application topically as needed for dry skin., Disp: 385 g, Rfl: 0   apixaban  (ELIQUIS ) 5 MG TABS tablet, Take 1 tablet (5 mg total) by mouth 2 (two) times daily., Disp: 60 tablet, Rfl: 2   atorvastatin  (LIPITOR) 40 MG tablet, Take 1 tablet (40 mg total) by mouth daily., Disp: 30 tablet, Rfl: 2   furosemide  (LASIX ) 40 MG tablet, Take 40 mg by mouth 2 (two) times daily., Disp: , Rfl:    losartan (COZAAR) 25 MG tablet, Take 25 mg by mouth daily., Disp: , Rfl:    MAGNESIUM  PO, Take 1 tablet by mouth in the morning and at bedtime., Disp: , Rfl:    memantine  (NAMENDA  TITRATION PAK) tablet pack, 5 mg/day for =1 week; 5 mg twice daily for =1 week; 15 mg/day given in 5 mg and 10 mg separated doses for =1 week; then 10 mg twice daily, Disp: 49 tablet, Rfl: 0   memantine  (NAMENDA ) 10 MG tablet, Take 1 tablet (10 mg total) by mouth 2 (two) times  daily., Disp: 60 tablet, Rfl: 5   metoprolol  (LOPRESSOR ) 50 MG tablet, Take 50 mg by mouth 2 (two) times daily., Disp: , Rfl:    multivitamin (RENA-VIT) TABS tablet, Take 1 tablet by mouth daily., Disp: , Rfl:    mycophenolate  (CELLCEPT ) 250 MG capsule, Take 250 mg by mouth 2 (two) times daily., Disp: , Rfl:    nystatin  cream (MYCOSTATIN ), Apply 1 application topically 2 (two) times daily. (Patient taking differently: Apply 1 Application topically 2 (two) times daily as needed (rash). When able to remember), Disp: 30 g, Rfl: 2   pantoprazole  (PROTONIX ) 40 MG tablet, Take 40 mg by mouth daily. , Disp: , Rfl:    potassium chloride  SA (K-DUR,KLOR-CON ) 20 MEQ tablet, Take 20 mEq by mouth 2 (two) times daily., Disp: , Rfl:    silver  sulfADIAZINE  (SILVADENE ) 1 % cream, Apply 1 Application topically daily., Disp: 50 g, Rfl: 0   tacrolimus  (PROGRAF ) 0.5 MG capsule, Take 0.5 mg by mouth every evening., Disp: , Rfl:    tacrolimus  (PROGRAF ) 1 MG capsule, Take 2 mg by mouth 2 (two) times daily.,  Disp: , Rfl:    Vitamin D , Ergocalciferol , (DRISDOL ) 1.25 MG (50000 UNIT) CAPS capsule, TAKE 1 CAPSULE BY MOUTH TWICE A WEEK ON THURSDAY AND SUNDAY, Disp: 24 capsule, Rfl: 1   Allergies  Allergen Reactions   Vancomycin  Hives, Itching, Rash and Other (See Comments)    Blistering, swelling, and peeling of feet.  Pt denies any similar reactions around face or neck.   Iohexol       Desc: iodine contrast/per dialysis center, Onset Date: [id number]      Review of Systems   There were no vitals filed for this visit. There is no height or weight on file to calculate BMI.  Wt Readings from Last 3 Encounters:  12/02/23 197 lb (89.4 kg)  10/25/23 184 lb (83.5 kg)  07/07/23 190 lb 12.8 oz (86.5 kg)    The ASCVD Risk score (Arnett DK, et al., 2019) failed to calculate for the following reasons:   Risk score cannot be calculated because patient has a medical history suggesting prior/existing ASCVD  Objective:  Physical  Exam      Assessment And Plan:  Mixed hyperlipidemia  Benign hypertensive heart disease without heart failure  Prediabetes    No follow-ups on file.  Patient was given opportunity to ask questions. Patient verbalized understanding of the plan and was able to repeat key elements of the plan. All questions were answered to their satisfaction.    Inge Mangle, FNP, have reviewed all documentation for this visit. The documentation on 01/18/24 for the exam, diagnosis, procedures, and orders are all accurate and complete.   IF YOU HAVE BEEN REFERRED TO A SPECIALIST, IT MAY TAKE 1-2 WEEKS TO SCHEDULE/PROCESS THE REFERRAL. IF YOU HAVE NOT HEARD FROM US /SPECIALIST IN TWO WEEKS, PLEASE GIVE US  A CALL AT [phone].

## 2024-01-19 ENCOUNTER — Other Ambulatory Visit

## 2024-01-19 ENCOUNTER — Encounter: Payer: Self-pay | Admitting: Nurse Practitioner

## 2024-01-19 ENCOUNTER — Ambulatory Visit: Payer: Medicare Other

## 2024-01-19 VITALS — BP 118/70 | HR 67 | Temp 98.2°F | Ht 65.0 in | Wt 188.0 lb

## 2024-01-19 DIAGNOSIS — Z0000 Encounter for general adult medical examination without abnormal findings: Secondary | ICD-10-CM

## 2024-01-19 DIAGNOSIS — I119 Hypertensive heart disease without heart failure: Secondary | ICD-10-CM

## 2024-01-19 DIAGNOSIS — R7303 Prediabetes: Secondary | ICD-10-CM

## 2024-01-19 DIAGNOSIS — E782 Mixed hyperlipidemia: Secondary | ICD-10-CM

## 2024-01-19 NOTE — Patient Instructions (Signed)
 [name] , Thank you for taking time out of your busy schedule to complete your Annual Wellness Visit with me. I enjoyed our conversation and look forward to speaking with you again next year. I, as well as your care team,  appreciate your ongoing commitment to your health goals. Please review the following plan we discussed and let me know if I can assist you in the future. Your Game plan/ To Do List    Referrals: If you haven't heard from the office you've been referred to, please reach out to them at the phone provided.  N/a Follow up Visits: Next Medicare AWV with our clinical staff: office will schedule   Have you seen your provider in the last 6 months (3 months if uncontrolled diabetes)? Yes Next Office Visit with your provider: 06/06/2024 at 10:20  Clinician Recommendations:  Aim for 30 minutes of exercise or brisk walking, 6-8 glasses of water, and 5 servings of fruits and vegetables each day.       This is a list of the screening recommended for you and due dates:  Health Maintenance  Topic Date Due   COVID-19 Vaccine (7 - Pfizer risk 2024-25 season) 01/04/2024   Colon Cancer Screening  01/18/2025*   Flu Shot  03/17/2024   Medicare Annual Wellness Visit  01/18/2025   DTaP/Tdap/Td vaccine (2 - Td or Tdap) 12/30/2032   Pneumonia Vaccine  Completed   DEXA scan (bone density measurement)  Completed   Hepatitis C Screening  Completed   Zoster (Shingles) Vaccine  Completed   HPV Vaccine  Aged Out   Meningitis B Vaccine  Aged Out  *Topic was postponed. The date shown is not the original due date.    Advanced directives: (Declined) Advance directive discussed with you today. Even though you declined this today, please call our office should you change your mind, and we can give you the proper paperwork for you to fill out. Advance Care Planning is important because it:  [x]  Makes sure you receive the medical care that is consistent with your values, goals, and  preferences  [x]  It provides guidance to your family and loved ones and reduces their decisional burden about whether or not they are making the right decisions based on your wishes.  Follow the link provided in your after visit summary or read over the paperwork we have mailed to you to help you started getting your Advance Directives in place. If you need assistance in completing these, please reach out to us  so that we can help you!  See attachments for Preventive Care and Fall Prevention Tips.

## 2024-01-19 NOTE — Progress Notes (Signed)
 Subjective:   Sarah Gilbert is a 78 y.o. who presents for a Medicare Wellness preventive visit.  As a reminder, Annual Wellness Visits don't include a physical exam, and some assessments may be limited, especially if this visit is performed virtually. We may recommend an in-person follow-up visit with your provider if needed.  Visit Complete: In person    Persons Participating in Visit: Patient.  AWV Questionnaire: No: Patient Medicare AWV questionnaire was not completed prior to this visit.  Cardiac Risk Factors include: advanced age (>61men, >64 women);dyslipidemia;hypertension     Objective:     Today's Vitals   01/19/24 0819  BP: 118/70  Pulse: 67  Temp: 98.2 F (36.8 C)  TempSrc: Oral  SpO2: 98%  Weight: 188 lb (85.3 kg)  Height: 5\' 5"  (1.651 m)   Body mass index is 31.28 kg/m.     01/19/2024    8:25 AM 12/31/2022    8:37 AM 08/03/2022    9:54 PM 02/07/2022   11:10 PM 12/18/2021    8:45 AM 07/02/2021    9:40 AM 12/04/2020    9:49 AM  Advanced Directives  Does Patient Have a Medical Advance Directive? No No No No No No No  Would patient like information on creating a medical advance directive?  No - Patient declined  No - Patient declined No - Patient declined No - Patient declined No - Patient declined    Current Medications (verified) Outpatient Encounter Medications as of 01/19/2024  Medication Sig   amLODipine  (NORVASC ) 10 MG tablet Take 5 mg by mouth daily.    ammonium lactate  (AMLACTIN) 12 % cream Apply 1 Application topically as needed for dry skin.   apixaban  (ELIQUIS ) 5 MG TABS tablet Take 1 tablet (5 mg total) by mouth 2 (two) times daily.   atorvastatin  (LIPITOR) 40 MG tablet Take 1 tablet (40 mg total) by mouth daily.   furosemide  (LASIX ) 40 MG tablet Take 40 mg by mouth 2 (two) times daily.   losartan (COZAAR) 25 MG tablet Take 25 mg by mouth daily.   MAGNESIUM  PO Take 1 tablet by mouth in the morning and at bedtime.   memantine  (NAMENDA   TITRATION PAK) tablet pack 5 mg/day for =1 week; 5 mg twice daily for =1 week; 15 mg/day given in 5 mg and 10 mg separated doses for =1 week; then 10 mg twice daily   memantine  (NAMENDA ) 10 MG tablet Take 1 tablet (10 mg total) by mouth 2 (two) times daily.   metoprolol  (LOPRESSOR ) 50 MG tablet Take 50 mg by mouth 2 (two) times daily.   multivitamin (RENA-VIT) TABS tablet Take 1 tablet by mouth daily.   mycophenolate  (CELLCEPT ) 250 MG capsule Take 250 mg by mouth 2 (two) times daily.   nystatin  cream (MYCOSTATIN ) Apply 1 application topically 2 (two) times daily. (Patient taking differently: Apply 1 Application topically 2 (two) times daily as needed (rash). When able to remember)   pantoprazole  (PROTONIX ) 40 MG tablet Take 40 mg by mouth daily.    potassium chloride  SA (K-DUR,KLOR-CON ) 20 MEQ tablet Take 20 mEq by mouth 2 (two) times daily.   silver  sulfADIAZINE  (SILVADENE ) 1 % cream Apply 1 Application topically daily.   tacrolimus  (PROGRAF ) 0.5 MG capsule Take 0.5 mg by mouth every evening.   tacrolimus  (PROGRAF ) 1 MG capsule Take 2 mg by mouth 2 (two) times daily.   Vitamin D , Ergocalciferol , (DRISDOL ) 1.25 MG (50000 UNIT) CAPS capsule TAKE 1 CAPSULE BY MOUTH TWICE A WEEK ON THURSDAY  AND SUNDAY   No facility-administered encounter medications on file as of 01/19/2024.    Allergies (verified) Vancomycin  and Iohexol    History: Past Medical History:  Diagnosis Date   Anemia    Arthritis    Atrial fibrillation (HCC)    Bleeding nose    history of    ESRD (end stage renal disease) (HCC)    History of bronchitis    Hyperlipemia    Hypertension    Joint inflammation    Obesity    PONV (postoperative nausea and vomiting)    Rotator cuff tear arthropathy of right shoulder 06/18/2015   Thyroid disease    Wears glasses    Past Surgical History:  Procedure Laterality Date   ABDOMINAL HYSTERECTOMY     APPENDECTOMY     CARDIAC CATHETERIZATION N/A 04/18/2015   Procedure: Left Heart Cath  and Coronary Angiography;  Surgeon: Chapman Commodore, MD;  Location: [facility];  Service: Cardiovascular;  Laterality: N/A;   CHOLECYSTECTOMY     COLONOSCOPY     IR CT HEAD LTD  01/07/2020   IR PERCUTANEOUS ART THROMBECTOMY/INFUSION INTRACRANIAL INC DIAG ANGIO  01/07/2020   IR US  GUIDE VASC ACCESS RIGHT  01/07/2020   KIDNEY TRANSPLANT     june 2011-[facility]   RADIOLOGY WITH ANESTHESIA N/A 01/07/2020   Procedure: IR WITH ANESTHESIA;  Surgeon: Radiologist, Medication, MD;  Location: [facility] OR;  Service: Radiology;  Laterality: N/A;   TONSILLECTOMY     TOTAL SHOULDER ARTHROPLASTY Right 06/18/2015   Procedure: RIGHT TOTAL SHOULDER ARTHROPLASTY;  Surgeon: Osa Blase, MD;  Location: [facility] OR;  Service: Orthopedics;  Laterality: Right;   Family History  Problem Relation Age of Onset   Dementia Mother    Cancer Father    Breast cancer Neg Hx    Sleep apnea Neg Hx    Social History   Socioeconomic History   Marital status: Single    Spouse name: Not on file   Number of children: Not on file   Years of education: Not on file   Highest education level: Not on file  Occupational History   Occupation: retired  Tobacco Use   Smoking status: Never   Smokeless tobacco: Never  Vaping Use   Vaping status: Never Used  Substance and Sexual Activity   Alcohol use: No   Drug use: No   Sexual activity: Not Currently  Other Topics Concern   Not on file  Social History Narrative   Not on file   Social Drivers of Health   Financial Resource Strain: Low Risk  (01/19/2024)   Overall Financial Resource Strain (CARDIA)    Difficulty of Paying Living Expenses: Not hard at all  Food Insecurity: No Food Insecurity (01/19/2024)   Hunger Vital Sign    Worried About Running Out of Food in the Last Year: Never true    Ran Out of Food in the Last Year: Never true  Transportation Needs: No Transportation Needs (01/19/2024)   PRAPARE - Administrator, Civil Service (Medical): No    Lack of  Transportation (Non-Medical): No  Physical Activity: Inactive (01/19/2024)   Exercise Vital Sign    Days of Exercise per Week: 0 days    Minutes of Exercise per Session: 0 min  Stress: No Stress Concern Present (01/19/2024)   [employer] of Occupational Health - Occupational Stress Questionnaire    Feeling of Stress : Not at all  Social Connections: Moderately Isolated (01/19/2024)   Social Connection and Isolation Panel [  NHANES]    Frequency of Communication with Friends and Family: More than three times a week    Frequency of Social Gatherings with Friends and Family: More than three times a week    Attends Religious Services: More than 4 times per year    Active Member of [employer] or Organizations: No    Attends Engineer, structural: Never    Marital Status: Never married    Tobacco Counseling Counseling given: Not Answered    Clinical Intake:  Pre-visit preparation completed: Yes  Pain : No/denies pain     Nutritional Status: BMI > 30  Obese Nutritional Risks: None Diabetes: No  Lab Results  Component Value Date   HGBA1C 6.0 (H) 07/07/2023   HGBA1C 5.7 (H) 02/08/2022   HGBA1C 5.9 (H) 12/18/2021     How often do you need to have someone help you when you read instructions, pamphlets, or other written materials from your doctor or pharmacy?: 1 - Never  Interpreter Needed?: No  Information entered by :: NAllen LPN   Activities of Daily Living     01/19/2024    8:20 AM  In your present state of health, do you have any difficulty performing the following activities:  Hearing? 0  Vision? 0  Difficulty concentrating or making decisions? 1  Comment bad memory  Walking or climbing stairs? 0  Dressing or bathing? 0  Doing errands, shopping? 0  Preparing Food and eating ? N  Using the Toilet? N  In the past six months, have you accidently leaked urine? N  Do you have problems with loss of bowel control? N  Managing your Medications? N  Managing your  Finances? N  Housekeeping or managing your Housekeeping? N    Patient Care Team: [name], FNP as PCP - General (General Practice)  I have updated your Care Teams any recent Medical Services you may have received from other providers in the past year.     Assessment:    This is a routine wellness examination for [place].  Hearing/Vision screen Hearing Screening - Comments:: Denies hearing issues Vision Screening - Comments:: No regular eye exams,    Goals Addressed             This Visit's Progress    Patient Stated       01/19/2024, wants to lose weight       Depression Screen     01/19/2024    8:27 AM 12/31/2022    8:43 AM 12/31/2022    8:37 AM 06/23/2022    9:23 AM 12/18/2021    8:47 AM 12/04/2020    9:51 AM 02/13/2020    9:44 AM  PHQ 2/9 Scores  PHQ - 2 Score 0 0 0 0 0 0 0  PHQ- 9 Score 0          Fall Risk     01/19/2024    8:26 AM 12/31/2022    8:43 AM 12/31/2022    8:37 AM 06/23/2022    9:23 AM 12/18/2021    8:46 AM  Fall Risk   Falls in the past year? 0 0 0 0 0  Number falls in past yr: 0 0 0 0 0  Injury with Fall? 0 0 0 0 0  Risk for fall due to : Medication side effect No Fall Risks Medication side effect No Fall Risks Impaired balance/gait;Impaired mobility;Medication side effect  Follow up Falls evaluation completed;Falls prevention discussed Falls evaluation completed Falls prevention discussed;Education provided;Falls evaluation completed  Falls evaluation completed Falls evaluation completed;Education provided;Falls prevention discussed    MEDICARE RISK AT HOME:  Medicare Risk at Home Any stairs in or around the home?: Yes If so, are there any without handrails?: No Home free of loose throw rugs in walkways, pet beds, electrical cords, etc?: Yes Adequate lighting in your home to reduce risk of falls?: Yes Life alert?: No Use of a cane, walker or w/c?: Yes Grab bars in the bathroom?: No Shower chair or bench in shower?: Yes Elevated toilet seat or  a handicapped toilet?: Yes  TIMED UP AND GO:  Was the test performed?  Yes  Length of time to ambulate 10 feet: 7 sec Gait slow and steady with assistive device  Cognitive Function: 6CIT completed    10/25/2023   10:48 AM 03/17/2023    7:58 AM 09/16/2022    8:39 AM 03/11/2022    9:45 AM  MMSE - Mini Mental State Exam  Orientation to time 3 3 5 5   Orientation to Place 5 5 4 5   Registration 3 3 3 3   Attention/ Calculation 1 1 1 2   Recall 2 3 1 2   Language- name 2 objects 2 2 2 2   Language- repeat 1 1 1 1   Language- follow 3 step command 3 3 3 3   Language- read & follow direction 1 0 1 1  Write a sentence 1 1 1 1   Copy design 0 0 0 1  Total score 22 22 22 26         01/19/2024    8:27 AM 12/02/2023    9:44 AM 12/31/2022    8:39 AM 12/18/2021    8:48 AM 12/04/2020    9:52 AM  6CIT Screen  What Year? 0 points 0 points 0 points 0 points 0 points  What month? 0 points 0 points 0 points 0 points 0 points  What time? 0 points 0 points 0 points 0 points 0 points  Count back from 20 0 points 0 points 0 points 0 points 0 points  Months in reverse 0 points 0 points 2 points 0 points 0 points  Repeat phrase 10 points 10 points 6 points 6 points 6 points  Total Score 10 points 10 points 8 points 6 points 6 points    Immunizations Immunization History  Administered Date(s) Administered   Fluad Quad(high Dose 65+) 05/22/2021, 06/23/2022   Fluad Trivalent(High Dose 65+) 07/07/2023   Influenza,inj,Quad PF,6+ Mos 06/20/2015   Influenza-Unspecified 05/27/2020   PFIZER Comirnaty(Gray Top)Covid-19 Tri-Sucrose Vaccine 11/20/2020   PFIZER(Purple Top)SARS-COV-2 Vaccination 10/13/2019, 11/07/2019, 05/25/2020   PNEUMOCOCCAL CONJUGATE-20 07/21/2022   Pfizer Covid-19 Vaccine Bivalent Booster 66yrs & up 06/13/2021   Pfizer(Comirnaty)Fall Seasonal Vaccine 12 years and older 07/07/2023   Pneumococcal Polysaccharide-23 07/09/2021   Tdap 12/31/2022   Zoster Recombinant(Shingrix ) 12/04/2020, 08/04/2021     Screening Tests Health Maintenance  Topic Date Due   COVID-19 Vaccine (7 - Pfizer risk 2024-25 season) 01/04/2024   Colonoscopy  01/18/2025 (Originally 09/09/2021)   INFLUENZA VACCINE  03/17/2024   Medicare Annual Wellness (AWV)  01/18/2025   DTaP/Tdap/Td (2 - Td or Tdap) 12/30/2032   Pneumonia Vaccine 20+ Years old  Completed   DEXA SCAN  Completed   Hepatitis C Screening  Completed   Zoster Vaccines- Shingrix   Completed   HPV VACCINES  Aged Out   Meningococcal B Vaccine  Aged Out    Health Maintenance  Health Maintenance Due  Topic Date Due   COVID-19 Vaccine (7 - Pfizer risk 2024-25  season) 01/04/2024   Health Maintenance Items Addressed: Declines colonoscopy.  Additional Screening:  Vision Screening: Recommended annual ophthalmology exams for early detection of glaucoma and other disorders of the eye. Would you like a referral to an eye doctor? No    Dental Screening: Recommended annual dental exams for proper oral hygiene  Community Resource Referral / Chronic Care Management: CRR required this visit?  No   CCM required this visit?  No   Plan:    I have personally reviewed and noted the following in the patient's chart:   Medical and social history Use of alcohol, tobacco or illicit drugs  Current medications and supplements including opioid prescriptions. Patient is not currently taking opioid prescriptions. Functional ability and status Nutritional status Physical activity Advanced directives List of other physicians Hospitalizations, surgeries, and ER visits in previous 12 months Vitals Screenings to include cognitive, depression, and falls Referrals and appointments  In addition, I have reviewed and discussed with patient certain preventive protocols, quality metrics, and best practice recommendations. A written personalized care plan for preventive services as well as general preventive health recommendations were provided to  patient.   Areatha Beecham, LPN   08/17/9145   After Visit Summary: (In Person-Printed) AVS printed and given to the patient  Notes: Nothing significant to report at this time.

## 2024-01-20 ENCOUNTER — Ambulatory Visit: Payer: Self-pay | Admitting: Nurse Practitioner

## 2024-01-20 ENCOUNTER — Telehealth: Payer: Self-pay | Admitting: Adult Health

## 2024-01-20 LAB — CBC WITH DIFFERENTIAL/PLATELET
Basophils Absolute: 0 10*3/uL (ref 0.0–0.2)
Basos: 1 %
EOS (ABSOLUTE): 0.1 10*3/uL (ref 0.0–0.4)
Eos: 3 %
Hematocrit: 40.5 % (ref 34.0–46.6)
Hemoglobin: 12.7 g/dL (ref 11.1–15.9)
Immature Grans (Abs): 0 10*3/uL (ref 0.0–0.1)
Immature Granulocytes: 0 %
Lymphocytes Absolute: 1.2 10*3/uL (ref 0.7–3.1)
Lymphs: 22 %
MCH: 29.7 pg (ref 26.6–33.0)
MCHC: 31.4 g/dL — ABNORMAL LOW (ref 31.5–35.7)
MCV: 95 fL (ref 79–97)
Monocytes Absolute: 0.4 10*3/uL (ref 0.1–0.9)
Monocytes: 8 %
Neutrophils Absolute: 3.5 10*3/uL (ref 1.4–7.0)
Neutrophils: 65 %
Platelets: 251 10*3/uL (ref 150–450)
RBC: 4.28 x10E6/uL (ref 3.77–5.28)
RDW: 13.6 % (ref 11.7–15.4)
WBC: 5.3 10*3/uL (ref 3.4–10.8)

## 2024-01-20 LAB — CMP14+EGFR
ALT: 10 IU/L (ref 0–32)
AST: 22 IU/L (ref 0–40)
Albumin: 4.4 g/dL (ref 3.8–4.8)
Alkaline Phosphatase: 94 IU/L (ref 44–121)
BUN/Creatinine Ratio: 15 (ref 12–28)
BUN: 15 mg/dL (ref 8–27)
Bilirubin Total: 0.7 mg/dL (ref 0.0–1.2)
CO2: 22 mmol/L (ref 20–29)
Calcium: 9.8 mg/dL (ref 8.7–10.3)
Chloride: 103 mmol/L (ref 96–106)
Creatinine, Ser: 0.99 mg/dL (ref 0.57–1.00)
Globulin, Total: 3.2 g/dL (ref 1.5–4.5)
Glucose: 103 mg/dL — ABNORMAL HIGH (ref 70–99)
Potassium: 3.5 mmol/L (ref 3.5–5.2)
Sodium: 145 mmol/L — ABNORMAL HIGH (ref 134–144)
Total Protein: 7.6 g/dL (ref 6.0–8.5)
eGFR: 59 mL/min/{1.73_m2} — ABNORMAL LOW (ref >59)

## 2024-01-20 LAB — LIPID PANEL
Chol/HDL Ratio: 2.1 ratio (ref 0.0–4.4)
Cholesterol, Total: 163 mg/dL (ref 100–199)
HDL: 78 mg/dL (ref >39)
LDL Chol Calc (NIH): 77 mg/dL (ref 0–99)
Triglycerides: 35 mg/dL (ref 0–149)
VLDL Cholesterol Cal: 8 mg/dL (ref 5–40)

## 2024-01-20 LAB — MICROALBUMIN / CREATININE URINE RATIO
Creatinine, Urine: 38.6 mg/dL
Microalb/Creat Ratio: 54 mg/g{creat} — ABNORMAL HIGH (ref 0–29)
Microalbumin, Urine: 21 ug/mL

## 2024-01-20 LAB — HEMOGLOBIN A1C
Est. average glucose Bld gHb Est-mCnc: 123 mg/dL
Hgb A1c MFr Bld: 5.9 % — ABNORMAL HIGH (ref 4.8–5.6)

## 2024-01-20 LAB — VITAMIN B12: Vitamin B-12: 723 pg/mL (ref 232–1245)

## 2024-01-20 LAB — VITAMIN D 25 HYDROXY (VIT D DEFICIENCY, FRACTURES): Vit D, 25-Hydroxy: 98.5 ng/mL (ref 30.0–100.0)

## 2024-01-20 MED ORDER — MEMANTINE HCL 10 MG PO TABS: 10.0000 mg | ORAL_TABLET | Freq: Two times a day (BID) | ORAL | 5 refills | Status: DC

## 2024-01-20 NOTE — Telephone Encounter (Signed)
 Refill sent for the pt to the pharmacy

## 2024-01-20 NOTE — Telephone Encounter (Signed)
 Pt Daughter called to request Medication Refill   memantine  (NAMENDA ) 10 MG tablet  Daughter also want medication to be sent to different Pharmacy    [facility]  [address] [facility] [address], [place], [place] [address] [phone]

## 2024-01-21 LAB — URINE CULTURE

## 2024-01-23 NOTE — Progress Notes (Signed)
Error, not seen

## 2024-01-24 ENCOUNTER — Ambulatory Visit: Admitting: Podiatry

## 2024-02-03 ENCOUNTER — Ambulatory Visit: Admitting: Podiatry

## 2024-02-10 ENCOUNTER — Ambulatory Visit: Admitting: Podiatry

## 2024-03-03 ENCOUNTER — Ambulatory Visit: Admitting: Podiatry

## 2024-06-06 ENCOUNTER — Ambulatory Visit: Admitting: Nurse Practitioner

## 2024-06-06 ENCOUNTER — Encounter: Payer: Self-pay | Admitting: Nurse Practitioner

## 2024-06-06 VITALS — BP 130/70 | HR 77 | Temp 98.2°F | Ht 65.0 in | Wt 187.0 lb

## 2024-06-06 DIAGNOSIS — R7303 Prediabetes: Secondary | ICD-10-CM

## 2024-06-06 DIAGNOSIS — E6609 Other obesity due to excess calories: Secondary | ICD-10-CM

## 2024-06-06 DIAGNOSIS — I69354 Hemiplegia and hemiparesis following cerebral infarction affecting left non-dominant side: Secondary | ICD-10-CM

## 2024-06-06 DIAGNOSIS — Z940 Kidney transplant status: Secondary | ICD-10-CM

## 2024-06-06 DIAGNOSIS — E782 Mixed hyperlipidemia: Secondary | ICD-10-CM | POA: Diagnosis not present

## 2024-06-06 DIAGNOSIS — I119 Hypertensive heart disease without heart failure: Secondary | ICD-10-CM

## 2024-06-06 DIAGNOSIS — Z23 Encounter for immunization: Secondary | ICD-10-CM | POA: Diagnosis not present

## 2024-06-06 DIAGNOSIS — Z6831 Body mass index (BMI) 31.0-31.9, adult: Secondary | ICD-10-CM

## 2024-06-06 DIAGNOSIS — Z79899 Other long term (current) drug therapy: Secondary | ICD-10-CM

## 2024-06-06 DIAGNOSIS — E876 Hypokalemia: Secondary | ICD-10-CM

## 2024-06-06 DIAGNOSIS — I4811 Longstanding persistent atrial fibrillation: Secondary | ICD-10-CM

## 2024-06-06 DIAGNOSIS — E66811 Obesity, class 1: Secondary | ICD-10-CM

## 2024-06-06 NOTE — Progress Notes (Signed)
 I,Jameka J Llittleton, CMA,acting as a neurosurgeon for [employer], FNP.,have documented all relevant documentation on the behalf of Gaines Ada, FNP,as directed by  Gaines Ada, FNP while in the presence of Gaines Ada, FNP.  Subjective:  Patient ID: Sarah Gilbert , female    DOB: 16-Nov-1945 , 78 y.o.   MRN: [id number]  Chief Complaint  Patient presents with   Hypertension   Discussed the use of AI scribe software for clinical note transcription with the patient, who gave verbal consent to proceed.  History of Present Illness Sarah Gilbert is a 78 year old female with hypertension who presents for a follow-up on her blood pressure.  She has been stable over the last few months with no complaints regarding her blood pressure and does not require any medication refills at this time.  She is celebrating ten years since her double kidney transplant and continues to follow up with her transplant team. She recently visited her nephrologist, who told her that she is doing well and that her kidney functions are really good. She recalls being told that her potassium was a little low and was advised to eat foods high in potassium.  She has a rash, which she attributes to not using her prescribed cream. No swelling in her feet or ankles.  She received her flu shot and is up to date with her vaccinations, except for the COVID vaccine.   Hypertension This is a chronic problem. The current episode started more than 1 year ago. The problem is controlled. Pertinent negatives include no anxiety or chest pain. Risk factors for coronary artery disease include obesity and sedentary lifestyle.     Past Medical History:  Diagnosis Date   Anemia    Arthritis    Atrial fibrillation (HCC)    Bleeding nose    history of    ESRD (end stage renal disease) (HCC)    History of bronchitis    Hyperlipemia    Hypertension    Joint inflammation    Obesity    PONV (postoperative nausea and vomiting)     Rotator cuff tear arthropathy of right shoulder 06/18/2015   Thyroid disease    Wears glasses      Family History  Problem Relation Age of Onset   Dementia Mother    Cancer Father    Breast cancer Neg Hx    Sleep apnea Neg Hx      Current Outpatient Medications:    amLODipine  (NORVASC ) 10 MG tablet, Take 5 mg by mouth daily. , Disp: , Rfl:    ammonium lactate  (AMLACTIN) 12 % cream, Apply 1 Application topically as needed for dry skin., Disp: 385 g, Rfl: 0   apixaban  (ELIQUIS ) 5 MG TABS tablet, Take 1 tablet (5 mg total) by mouth 2 (two) times daily., Disp: 60 tablet, Rfl: 2   atorvastatin  (LIPITOR) 40 MG tablet, Take 1 tablet (40 mg total) by mouth daily., Disp: 30 tablet, Rfl: 2   furosemide  (LASIX ) 40 MG tablet, Take 40 mg by mouth 2 (two) times daily., Disp: , Rfl:    losartan (COZAAR) 25 MG tablet, Take 25 mg by mouth daily., Disp: , Rfl:    MAGNESIUM  PO, Take 1 tablet by mouth in the morning and at bedtime., Disp: , Rfl:    memantine  (NAMENDA ) 10 MG tablet, Take 1 tablet (10 mg total) by mouth 2 (two) times daily., Disp: 60 tablet, Rfl: 5   metoprolol  (LOPRESSOR ) 50 MG tablet, Take 50 mg by mouth  2 (two) times daily., Disp: , Rfl:    multivitamin (RENA-VIT) TABS tablet, Take 1 tablet by mouth daily., Disp: , Rfl:    mycophenolate  (CELLCEPT ) 250 MG capsule, Take 250 mg by mouth 2 (two) times daily., Disp: , Rfl:    nystatin  cream (MYCOSTATIN ), Apply 1 application topically 2 (two) times daily. (Patient taking differently: Apply 1 Application topically 2 (two) times daily as needed (rash). When able to remember), Disp: 30 g, Rfl: 2   pantoprazole  (PROTONIX ) 40 MG tablet, Take 40 mg by mouth daily. , Disp: , Rfl:    potassium chloride  SA (K-DUR,KLOR-CON ) 20 MEQ tablet, Take 20 mEq by mouth 2 (two) times daily., Disp: , Rfl:    silver  sulfADIAZINE  (SILVADENE ) 1 % cream, Apply 1 Application topically daily., Disp: 50 g, Rfl: 0   tacrolimus  (PROGRAF ) 0.5 MG capsule, Take 0.5 mg by  mouth every evening., Disp: , Rfl:    tacrolimus  (PROGRAF ) 1 MG capsule, Take 2 mg by mouth 2 (two) times daily., Disp: , Rfl:    Vitamin D , Ergocalciferol , (DRISDOL ) 1.25 MG (50000 UNIT) CAPS capsule, TAKE 1 CAPSULE BY MOUTH TWICE A WEEK ON THURSDAY AND SUNDAY, Disp: 24 capsule, Rfl: 1   Allergies  Allergen Reactions   Vancomycin  Hives, Itching, Rash and Other (See Comments)    Blistering, swelling, and peeling of feet.  Pt denies any similar reactions around face or neck.   Iohexol       Desc: iodine contrast/per dialysis center, Onset Date: [id number]      Review of Systems  Constitutional: Negative.   Eyes: Negative.   Cardiovascular:  Negative for chest pain.  Musculoskeletal: Negative.   Skin: Negative.   Psychiatric/Behavioral: Negative.       Today's Vitals   06/06/24 1009  BP: 130/70  Pulse: 77  Temp: 98.2 F (36.8 C)  TempSrc: Oral  Weight: 187 lb (84.8 kg)  Height: 5' 5 (1.651 m)  PainSc: 0-No pain   Body mass index is 31.12 kg/m.  Wt Readings from Last 3 Encounters:  06/06/24 187 lb (84.8 kg)  01/19/24 188 lb (85.3 kg)  12/02/23 197 lb (89.4 kg)    The ASCVD Risk score (Arnett DK, et al., 2019) failed to calculate for the following reasons:   Risk score cannot be calculated because patient has a medical history suggesting prior/existing ASCVD  Objective:  Physical Exam Vitals and nursing note reviewed.  Constitutional:      General: She is not in acute distress.    Appearance: Normal appearance. She is obese.  Cardiovascular:     Rate and Rhythm: Normal rate and regular rhythm.     Pulses: Normal pulses.     Heart sounds: Normal heart sounds. No murmur heard. Pulmonary:     Effort: Pulmonary effort is normal. No respiratory distress.     Breath sounds: Normal breath sounds. No wheezing.  Musculoskeletal:     Right lower leg: No edema.     Left lower leg: No edema.  Skin:    General: Skin is warm and dry.     Findings: No rash.  Neurological:      General: No focal deficit present.     Mental Status: She is alert and oriented to person, place, and time.     Cranial Nerves: No cranial nerve deficit.     Motor: No weakness.  Psychiatric:        Mood and Affect: Mood normal.        Behavior: Behavior normal.  Thought Content: Thought content normal.        Judgment: Judgment normal.         Assessment And Plan:  Benign hypertensive heart disease without heart failure Assessment & Plan: Blood pressure stable, no medication changes needed.   Mixed hyperlipidemia Assessment & Plan: Chronic, stable. Continue statin.   Orders: -     BMP8+eGFR  Need for influenza vaccination -     Flu vaccine HIGH DOSE PF(Fluzone Trivalent)  Class 1 obesity due to excess calories with body mass index (BMI) of 31.0 to 31.9 in adult, unspecified whether serious comorbidity present Assessment & Plan: She is encouraged to strive for BMI less than 30 to decrease cardiac risk. Advised to aim for at least 150 minutes of exercise per week.    Prediabetes Assessment & Plan: Chronic, stable.  Continue focusing on healthy diet.  Orders: -     Hemoglobin A1c  Other long term (current) drug therapy  Hemiparesis affecting left side as late effect of cerebrovascular accident [facility]) Assessment & Plan: Stable.  Uses a cane intermittently   Longstanding persistent atrial fibrillation [facility] Assessment & Plan: Continue current medications.   Morbid obesity (HCC)  History of renal transplant Assessment & Plan: Celebrated ten years post-transplant with stable kidney function and mild hypokalemia. - Order lab test to check potassium levels. - Advise consumption of potassium-rich foods.   Hypokalemia Assessment & Plan: Potassium levels slightly low. - Order lab test to check potassium levels. - Advise consumption of potassium-rich foods.     Return in about 4 months (around 10/07/2024) for bpc.  Patient was given opportunity to ask  questions. Patient verbalized understanding of the plan and was able to repeat key elements of the plan. All questions were answered to their satisfaction.    LILLETTE Gaines Ada, FNP, have reviewed all documentation for this visit. The documentation on 06/06/24 for the exam, diagnosis, procedures, and orders are all accurate and complete.   IF YOU HAVE BEEN REFERRED TO A SPECIALIST, IT MAY TAKE 1-2 WEEKS TO SCHEDULE/PROCESS THE REFERRAL. IF YOU HAVE NOT HEARD FROM US /SPECIALIST IN TWO WEEKS, PLEASE GIVE US  A CALL AT [phone].

## 2024-06-06 NOTE — Patient Instructions (Signed)
 Hypertension, Adult Hypertension is another name for high blood pressure. High blood pressure forces your heart to work harder to pump blood. This can cause problems over time. There are two numbers in a blood pressure reading. There is a top number (systolic) over a bottom number (diastolic). It is best to have a blood pressure that is below 120/80. What are the causes? The cause of this condition is not known. Some other conditions can lead to high blood pressure. What increases the risk? Some lifestyle factors can make you more likely to develop high blood pressure: Smoking. Not getting enough exercise or physical activity. Being overweight. Having too much fat, sugar, calories, or salt (sodium) in your diet. Drinking too much alcohol. Other risk factors include: Having any of these conditions: Heart disease. Diabetes. High cholesterol. Kidney disease. Obstructive sleep apnea. Having a family history of high blood pressure and high cholesterol. Age. The risk increases with age. Stress. What are the signs or symptoms? High blood pressure may not cause symptoms. Very high blood pressure (hypertensive crisis) may cause: Headache. Fast or uneven heartbeats (palpitations). Shortness of breath. Nosebleed. Vomiting or feeling like you may vomit (nauseous). Changes in how you see. Very bad chest pain. Feeling dizzy. Seizures. How is this treated? This condition is treated by making healthy lifestyle changes, such as: Eating healthy foods. Exercising more. Drinking less alcohol. Your doctor may prescribe medicine if lifestyle changes do not help enough and if: Your top number is above 130. Your bottom number is above 80. Your personal target blood pressure may vary. Follow these instructions at home: Eating and drinking  If told, follow the DASH eating plan. To follow this plan: Fill one half of your plate at each meal with fruits and vegetables. Fill one fourth of your plate  at each meal with whole grains. Whole grains include whole-wheat pasta, brown rice, and whole-grain bread. Eat or drink low-fat dairy products, such as skim milk or low-fat yogurt. Fill one fourth of your plate at each meal with low-fat (lean) proteins. Low-fat proteins include fish, chicken without skin, eggs, beans, and tofu. Avoid fatty meat, cured and processed meat, or chicken with skin. Avoid pre-made or processed food. Limit the amount of salt in your diet to less than 1,500 mg each day. Do not drink alcohol if: Your doctor tells you not to drink. You are pregnant, may be pregnant, or are planning to become pregnant. If you drink alcohol: Limit how much you have to: 0-1 drink a day for women. 0-2 drinks a day for men. Know how much alcohol is in your drink. In the [place] one drink equals one 12 oz bottle of beer (355 mL), one 5 oz glass of wine (148 mL), or one 1 oz glass of hard liquor (44 mL). Lifestyle  Work with your doctor to stay at a healthy weight or to lose weight. Ask your doctor what the best weight is for you. Get at least 30 minutes of exercise that causes your heart to beat faster (aerobic exercise) most days of the week. This may include walking, swimming, or biking. Get at least 30 minutes of exercise that strengthens your muscles (resistance exercise) at least 3 days a week. This may include lifting weights or doing Pilates. Do not smoke or use any products that contain nicotine or tobacco. If you need help quitting, ask your doctor. Check your blood pressure at home as told by your doctor. Keep all follow-up visits. Medicines Take over-the-counter and prescription medicines  only as told by your doctor. Follow directions carefully. Do not skip doses of blood pressure medicine. The medicine does not work as well if you skip doses. Skipping doses also puts you at risk for problems. Ask your doctor about side effects or reactions to medicines that you should watch  for. Contact a doctor if: You think you are having a reaction to the medicine you are taking. You have headaches that keep coming back. You feel dizzy. You have swelling in your ankles. You have trouble with your vision. Get help right away if: You get a very bad headache. You start to feel mixed up (confused). You feel weak or numb. You feel faint. You have very bad pain in your: Chest. Belly (abdomen). You vomit more than once. You have trouble breathing. These symptoms may be an emergency. Get help right away. Call 911. Do not wait to see if the symptoms will go away. Do not drive yourself to the hospital. Summary Hypertension is another name for high blood pressure. High blood pressure forces your heart to work harder to pump blood. For most people, a normal blood pressure is less than 120/80. Making healthy choices can help lower blood pressure. If your blood pressure does not get lower with healthy choices, you may need to take medicine. This information is not intended to replace advice given to you by your health care provider. Make sure you discuss any questions you have with your health care provider. Document Revised: 05/22/2021 Document Reviewed: 05/22/2021 Elsevier Patient Education  2024 [employer].

## 2024-06-07 LAB — BMP8+EGFR
BUN/Creatinine Ratio: 16 (ref 12–28)
BUN: 13 mg/dL (ref 8–27)
CO2: 23 mmol/L (ref 20–29)
Calcium: 9.8 mg/dL (ref 8.7–10.3)
Chloride: 103 mmol/L (ref 96–106)
Creatinine, Ser: 0.79 mg/dL (ref 0.57–1.00)
Glucose: 91 mg/dL (ref 70–99)
Potassium: 3.8 mmol/L (ref 3.5–5.2)
Sodium: 144 mmol/L (ref 134–144)
eGFR: 77 mL/min/1.73 (ref >59)

## 2024-06-07 LAB — HEMOGLOBIN A1C
Est. average glucose Bld gHb Est-mCnc: 120 mg/dL
Hgb A1c MFr Bld: 5.8 % — ABNORMAL HIGH (ref 4.8–5.6)

## 2024-06-13 ENCOUNTER — Ambulatory Visit: Payer: Self-pay | Admitting: Nurse Practitioner

## 2024-06-13 DIAGNOSIS — Z940 Kidney transplant status: Secondary | ICD-10-CM | POA: Insufficient documentation

## 2024-06-13 NOTE — Assessment & Plan Note (Signed)
 Chronic, stable.  Continue statin.

## 2024-06-13 NOTE — Assessment & Plan Note (Signed)
 Continue current medications.

## 2024-06-13 NOTE — Assessment & Plan Note (Signed)
 Chronic, stable.  Continue focusing on healthy diet.

## 2024-06-13 NOTE — Assessment & Plan Note (Signed)
 Potassium levels slightly low. - Order lab test to check potassium levels. - Advise consumption of potassium-rich foods.

## 2024-06-13 NOTE — Assessment & Plan Note (Signed)
 Stable.  Uses a cane intermittently

## 2024-06-13 NOTE — Assessment & Plan Note (Signed)
 Blood pressure stable, no medication changes needed.

## 2024-06-13 NOTE — Assessment & Plan Note (Signed)
 Celebrated ten years post-transplant with stable kidney function and mild hypokalemia. - Order lab test to check potassium levels. - Advise consumption of potassium-rich foods.

## 2024-06-13 NOTE — Assessment & Plan Note (Signed)
 She is encouraged to strive for BMI less than 30 to decrease cardiac risk. Advised to aim for at least 150 minutes of exercise per week.

## 2024-07-17 ENCOUNTER — Other Ambulatory Visit: Payer: Self-pay | Admitting: Adult Health

## 2024-07-17 NOTE — Telephone Encounter (Signed)
 Last refilled by patient on : 06/22/24 Last office visit : 10/25/23 Next office visit : patient was due back April 26, 2024 Per last note - Start memantine  with gradual titration to 10 mg twice daily

## 2024-07-28 ENCOUNTER — Other Ambulatory Visit: Payer: Self-pay | Admitting: Nurse Practitioner

## 2024-07-28 DIAGNOSIS — E559 Vitamin D deficiency, unspecified: Secondary | ICD-10-CM

## 2024-08-01 ENCOUNTER — Other Ambulatory Visit: Payer: Self-pay | Admitting: Nurse Practitioner

## 2024-08-01 DIAGNOSIS — E559 Vitamin D deficiency, unspecified: Secondary | ICD-10-CM

## 2024-08-16 ENCOUNTER — Other Ambulatory Visit: Payer: Self-pay | Admitting: Adult Health

## 2024-08-28 NOTE — Progress Notes (Unsigned)
 LILLETTE Kristeen JINNY Gladis, CMA,acting as a neurosurgeon for Gaines Ada, FNP.,have documented all relevant documentation on the behalf of Gaines Ada, FNP,as directed by  Gaines Ada, FNP while in the presence of Gaines Ada, FNP.  Subjective:  Patient ID: Sarah Gilbert , female    DOB: 1945/10/24 , 79 y.o.   MRN: [id number]  No chief complaint on file.   HPI  HPI   Past Medical History:  Diagnosis Date   Anemia    Arthritis    Atrial fibrillation (HCC)    Bleeding nose    history of    ESRD (end stage renal disease) (HCC)    History of bronchitis    Hyperlipemia    Hypertension    Joint inflammation    Obesity    PONV (postoperative nausea and vomiting)    Rotator cuff tear arthropathy of right shoulder 06/18/2015   Thyroid disease    Wears glasses      Family History  Problem Relation Age of Onset   Dementia Mother    Cancer Father    Breast cancer Neg Hx    Sleep apnea Neg Hx     Current Medications[1]   Allergies[2]   Review of Systems   There were no vitals filed for this visit. There is no height or weight on file to calculate BMI.  Wt Readings from Last 3 Encounters:  06/06/24 187 lb (84.8 kg)  01/19/24 188 lb (85.3 kg)  12/02/23 197 lb (89.4 kg)    The ASCVD Risk score (Arnett DK, et al., 2019) failed to calculate for the following reasons:   Risk score cannot be calculated because patient has a medical history suggesting prior/existing ASCVD   * - Cholesterol units were assumed  Objective:  Physical Exam      Assessment And Plan:   Assessment & Plan Benign hypertensive heart disease without heart failure  Mixed hyperlipidemia  Prediabetes   No orders of the defined types were placed in this encounter.    No follow-ups on file.  Patient was given opportunity to ask questions. Patient verbalized understanding of the plan and was able to repeat key elements of the plan. All questions were answered to their satisfaction.    LILLETTE Gaines Ada,  FNP, have reviewed all documentation for this visit. The documentation on 08/28/2024 for the exam, diagnosis, procedures, and orders are all accurate and complete.   IF YOU HAVE BEEN REFERRED TO A SPECIALIST, IT MAY TAKE 1-2 WEEKS TO SCHEDULE/PROCESS THE REFERRAL. IF YOU HAVE NOT HEARD FROM US /SPECIALIST IN TWO WEEKS, PLEASE GIVE US  A CALL AT [phone].     [1]  Current Outpatient Medications:    amLODipine  (NORVASC ) 10 MG tablet, Take 5 mg by mouth daily. , Disp: , Rfl:    ammonium lactate  (AMLACTIN) 12 % cream, Apply 1 Application topically as needed for dry skin., Disp: 385 g, Rfl: 0   apixaban  (ELIQUIS ) 5 MG TABS tablet, Take 1 tablet (5 mg total) by mouth 2 (two) times daily., Disp: 60 tablet, Rfl: 2   atorvastatin  (LIPITOR) 40 MG tablet, Take 1 tablet (40 mg total) by mouth daily., Disp: 30 tablet, Rfl: 2   furosemide  (LASIX ) 40 MG tablet, Take 40 mg by mouth 2 (two) times daily., Disp: , Rfl:    losartan (COZAAR) 25 MG tablet, Take 25 mg by mouth daily., Disp: , Rfl:    MAGNESIUM  PO, Take 1 tablet by mouth in the morning and at bedtime., Disp: , Rfl:  memantine  (NAMENDA ) 10 MG tablet, Take 1 tablet (10 mg total) by mouth 2 (two) times daily. Please call [phone] to schedule an office visit for further refills., Disp: 60 tablet, Rfl: 0   metoprolol  (LOPRESSOR ) 50 MG tablet, Take 50 mg by mouth 2 (two) times daily., Disp: , Rfl:    multivitamin (RENA-VIT) TABS tablet, Take 1 tablet by mouth daily., Disp: , Rfl:    mycophenolate  (CELLCEPT ) 250 MG capsule, Take 250 mg by mouth 2 (two) times daily., Disp: , Rfl:    nystatin  cream (MYCOSTATIN ), Apply 1 application topically 2 (two) times daily. (Patient taking differently: Apply 1 Application topically 2 (two) times daily as needed (rash). When able to remember), Disp: 30 g, Rfl: 2   pantoprazole  (PROTONIX ) 40 MG tablet, Take 40 mg by mouth daily. , Disp: , Rfl:    potassium chloride  SA (K-DUR,KLOR-CON ) 20 MEQ tablet, Take 20 mEq  by mouth 2 (two) times daily., Disp: , Rfl:    silver  sulfADIAZINE  (SILVADENE ) 1 % cream, Apply 1 Application topically daily., Disp: 50 g, Rfl: 0   tacrolimus  (PROGRAF ) 0.5 MG capsule, Take 0.5 mg by mouth every evening., Disp: , Rfl:    tacrolimus  (PROGRAF ) 1 MG capsule, Take 2 mg by mouth 2 (two) times daily., Disp: , Rfl:    Vitamin D , Ergocalciferol , (DRISDOL ) 1.25 MG (50000 UNIT) CAPS capsule, Take 1 capsule by mouth twice weekly on Thursday and Sunday, Disp: 24 capsule, Rfl: 1 [2]  Allergies Allergen Reactions   Vancomycin  Hives, Itching, Rash and Other (See Comments)    Blistering, swelling, and peeling of feet.  Pt denies any similar reactions around face or neck.   Iohexol       Desc: iodine contrast/per dialysis center, Onset Date: [id number]

## 2024-08-29 ENCOUNTER — Ambulatory Visit: Payer: Self-pay | Admitting: Nurse Practitioner

## 2024-08-29 DIAGNOSIS — I119 Hypertensive heart disease without heart failure: Secondary | ICD-10-CM

## 2024-08-29 DIAGNOSIS — E782 Mixed hyperlipidemia: Secondary | ICD-10-CM

## 2024-08-29 DIAGNOSIS — R7303 Prediabetes: Secondary | ICD-10-CM

## 2024-09-21 ENCOUNTER — Other Ambulatory Visit: Payer: Self-pay | Admitting: Adult Health

## 2024-10-05 ENCOUNTER — Ambulatory Visit: Payer: Self-pay | Admitting: Nurse Practitioner

## 2024-12-05 ENCOUNTER — Encounter: Payer: Self-pay | Admitting: Nurse Practitioner

## 2025-03-07 ENCOUNTER — Ambulatory Visit: Payer: Self-pay

## 2025-04-09 ENCOUNTER — Ambulatory Visit: Admitting: Adult Health
